# Patient Record
Sex: Female | Born: 1935 | Race: White | Hispanic: No | Smoking: Never smoker
Health system: Southern US, Community
[De-identification: ages and names within clinical notes are randomized; demographics above are authoritative.]

## PROBLEM LIST (undated history)

## (undated) DIAGNOSIS — M1990 Unspecified osteoarthritis, unspecified site: Secondary | ICD-10-CM

## (undated) DIAGNOSIS — F0390 Unspecified dementia without behavioral disturbance: Secondary | ICD-10-CM

## (undated) HISTORY — PX: ABDOMINAL HYSTERECTOMY: SHX81

---

## 1998-06-25 ENCOUNTER — Other Ambulatory Visit: Admission: RE | Admit: 1998-06-25 | Discharge: 1998-06-25 | Payer: Self-pay | Admitting: Obstetrics and Gynecology

## 1999-07-02 ENCOUNTER — Other Ambulatory Visit: Admission: RE | Admit: 1999-07-02 | Discharge: 1999-07-02 | Payer: Self-pay | Admitting: *Deleted

## 1999-11-05 ENCOUNTER — Encounter: Admission: RE | Admit: 1999-11-05 | Discharge: 1999-11-05 | Payer: Self-pay | Admitting: *Deleted

## 1999-11-05 ENCOUNTER — Encounter: Payer: Self-pay | Admitting: *Deleted

## 2000-03-18 ENCOUNTER — Encounter: Admission: RE | Admit: 2000-03-18 | Discharge: 2000-03-18 | Payer: Self-pay | Admitting: *Deleted

## 2000-03-18 ENCOUNTER — Encounter: Payer: Self-pay | Admitting: *Deleted

## 2000-03-31 ENCOUNTER — Encounter: Payer: Self-pay | Admitting: *Deleted

## 2000-03-31 ENCOUNTER — Encounter: Admission: RE | Admit: 2000-03-31 | Discharge: 2000-03-31 | Payer: Self-pay | Admitting: *Deleted

## 2000-06-01 ENCOUNTER — Encounter: Payer: Self-pay | Admitting: Gastroenterology

## 2000-06-01 ENCOUNTER — Encounter: Admission: RE | Admit: 2000-06-01 | Discharge: 2000-06-01 | Payer: Self-pay | Admitting: Gastroenterology

## 2000-11-08 ENCOUNTER — Encounter: Payer: Self-pay | Admitting: *Deleted

## 2000-11-08 ENCOUNTER — Encounter: Admission: RE | Admit: 2000-11-08 | Discharge: 2000-11-08 | Payer: Self-pay | Admitting: *Deleted

## 2001-07-26 ENCOUNTER — Other Ambulatory Visit: Admission: RE | Admit: 2001-07-26 | Discharge: 2001-07-26 | Payer: Self-pay | Admitting: *Deleted

## 2001-10-19 ENCOUNTER — Encounter: Admission: RE | Admit: 2001-10-19 | Discharge: 2001-10-19 | Payer: Self-pay | Admitting: *Deleted

## 2001-10-19 ENCOUNTER — Encounter: Payer: Self-pay | Admitting: *Deleted

## 2002-08-22 ENCOUNTER — Encounter: Admission: RE | Admit: 2002-08-22 | Discharge: 2002-08-22 | Payer: Self-pay

## 2002-10-23 ENCOUNTER — Encounter: Admission: RE | Admit: 2002-10-23 | Discharge: 2002-10-23 | Payer: Self-pay

## 2003-04-15 ENCOUNTER — Emergency Department (HOSPITAL_COMMUNITY): Admission: EM | Admit: 2003-04-15 | Discharge: 2003-04-15 | Payer: Self-pay

## 2003-10-25 ENCOUNTER — Encounter: Admission: RE | Admit: 2003-10-25 | Discharge: 2003-10-25 | Payer: Self-pay | Admitting: Family Medicine

## 2003-11-05 ENCOUNTER — Encounter: Admission: RE | Admit: 2003-11-05 | Discharge: 2004-02-03 | Payer: Self-pay | Admitting: Family Medicine

## 2005-11-06 ENCOUNTER — Encounter: Admission: RE | Admit: 2005-11-06 | Discharge: 2005-11-06 | Payer: Self-pay | Admitting: Family Medicine

## 2007-11-16 ENCOUNTER — Encounter: Admission: RE | Admit: 2007-11-16 | Discharge: 2007-11-16 | Payer: Self-pay | Admitting: Family Medicine

## 2010-10-27 ENCOUNTER — Observation Stay (HOSPITAL_COMMUNITY)
Admission: EM | Admit: 2010-10-27 | Discharge: 2010-10-28 | Disposition: A | Discharge status: Home / Self Care | Payer: Medicare Other | Attending: Infectious Diseases | Admitting: Infectious Diseases

## 2010-10-27 ENCOUNTER — Emergency Department (HOSPITAL_COMMUNITY): Payer: Medicare Other

## 2010-10-27 ENCOUNTER — Encounter: Payer: Self-pay | Admitting: Internal Medicine

## 2010-10-27 DIAGNOSIS — F0280 Dementia in other diseases classified elsewhere without behavioral disturbance: Secondary | ICD-10-CM

## 2010-10-27 DIAGNOSIS — G309 Alzheimer's disease, unspecified: Secondary | ICD-10-CM

## 2010-10-27 DIAGNOSIS — F0390 Unspecified dementia without behavioral disturbance: Secondary | ICD-10-CM | POA: Insufficient documentation

## 2010-10-27 DIAGNOSIS — R32 Unspecified urinary incontinence: Secondary | ICD-10-CM

## 2010-10-27 DIAGNOSIS — R55 Syncope and collapse: Secondary | ICD-10-CM

## 2010-10-27 DIAGNOSIS — R079 Chest pain, unspecified: Secondary | ICD-10-CM | POA: Insufficient documentation

## 2010-10-27 DIAGNOSIS — R61 Generalized hyperhidrosis: Secondary | ICD-10-CM | POA: Insufficient documentation

## 2010-10-27 LAB — BASIC METABOLIC PANEL
Calcium: 9.1 mg/dL (ref 8.4–10.5)
Chloride: 105 mEq/L (ref 96–112)
Creatinine, Ser: 0.79 mg/dL (ref 0.4–1.2)
Potassium: 4.6 mEq/L (ref 3.5–5.1)

## 2010-10-27 LAB — DIFFERENTIAL
Eosinophils Absolute: 0.1 10*3/uL (ref 0.0–0.7)
Eosinophils Relative: 1 % (ref 0–5)
Monocytes Relative: 8 % (ref 3–12)
Neutro Abs: 5.1 10*3/uL (ref 1.7–7.7)

## 2010-10-27 LAB — CBC
HCT: 41.9 % (ref 36.0–46.0)
Hemoglobin: 14.7 g/dL (ref 12.0–15.0)
MCV: 88.6 fL (ref 78.0–100.0)
Platelets: 216 10*3/uL (ref 150–400)
WBC: 8.1 10*3/uL (ref 4.0–10.5)

## 2010-10-27 LAB — TROPONIN I: Troponin I: 0.01 ng/mL (ref 0.00–0.06)

## 2010-10-27 LAB — POCT CARDIAC MARKERS
CKMB, poc: <1 ng/mL — ABNORMAL LOW (ref 1.0–8.0)
Myoglobin, poc: 108 ng/mL (ref 12–200)
Troponin i, poc: <0.05 ng/mL (ref 0.00–0.09)

## 2010-10-27 LAB — CK TOTAL AND CKMB (NOT AT ARMC)
Relative Index: INVALID (ref 0.0–2.5)
Total CK: 38 U/L (ref 7–177)

## 2010-10-27 MED ORDER — IOHEXOL 300 MG/ML  SOLN
100.0000 mL | Freq: Once | INTRAMUSCULAR | Status: AC | PRN
  Administered 2010-10-27: 100 mL via INTRAVENOUS

## 2010-10-27 NOTE — H&P (Signed)
Hospital Admission Note Date: 10/27/2010  Patient name: [name] Medical record number: [id number] Date of birth: 1921/01/09 Age: 77 y.o. Gender: female PCP: [facility]  Medical Service:  Attending physician: Dr. Darlina Sicilian   Pager: Resident (R1): Dr. Tonny Branch    Pager:  Resident (R1): Dr. Scot Dock    Pager: [phone]  Chief Complaint: Passing out and chest pain  History of Present Illness: Ms. Espino is an 77 year old woman with Alzheimer's dementia who presents with an episode of syncope and chest pain. Her son is present and he serves as the historian. He states that he was moving boxes into the house and his mother was helping to hold the door open when she complained of pain around her right axilla. She went to sit down, and when her son came over to check on her she was unresponsive. He states that she was "staring off into space," her eyes were "glazed over," and her palms were sweaty. This went on for about 30 seconds, after which the pt woke up and began complaining of pain in the center of her chest. The pt is unable to comment on the intensity or describe the pain as her dementia has affected her short-term memory. The pt also had some slurred speech and incontinence after the episode. She does have some incontinence at baseline. Prior to this incident, there was no change in her cognition or behavior. She has never had an event like this in the past.  Pt denies fever, SOB or sick contacts.   SH: Pt currently lives with her only son. She has a granddaughter who lives in [place] who also helps with her care. He states that she ambulates well, and stays at home alone during the day. At this time, only her short-term memory is affected by her disease. She is a widow of 13 years, and her husband also had Alzheimer's dementia. Her former PCP moved away (Dr. Larina Bras at [facility]), and since she has decided to go to Dr. Cyndia Bent at [facility], but she has not had an appointment with him yet.     PMH 1. Alzheimer's dementia  Home medications Nemenda 5 mg po qd Donepezil 10 mg po qd  Allergies: NKDA  Family History Social History  Review of Systems: Pertinent items are noted in HPI.  Physical Exam: VS- T- 97.7, P- 61-89, BP- 132-147/55-65, R- 18-20, O2 st- 100% RA. Not orthostatic in the ED  General appearance: alert, cooperative and no distress Head: Normocephalic, without obvious abnormality, atraumatic Neck: no adenopathy, no carotid bruit, no JVD, supple, symmetrical, trachea midline and thyroid not enlarged, symmetric, no tenderness/mass/nodules Lungs: clear to auscultation bilaterally Heart: regular rate and rhythm, S1, S2 normal, no murmur, click, rub or gallop Abdomen: soft, non-tender; bowel sounds normal; no masses,  no organomegaly Extremities: extremities normal, atraumatic, no cyanosis or edema Pulses: 2+ and symmetric Neurologic: Alert and oriented X 3, normal strength and tone. Normal symmetric reflexes. Normal coordination and gait Mental status: Alert, oriented, thought content appropriate Cranial nerves: normal Sensory: normal Motor:grossly normal Reflexes: 2+ and symmetric Coordination: normal Gait: Normal  Lab results:   WBC                                      8.1               4.0-10.5  K/uL  RBC                                      4.73              3.87-5.11        MIL/uL  Hemoglobin (HGB)                         14.7              12.0-15.0        g/dL  Hematocrit (HCT)                         41.9              36.0-46.0        %  MCV                                      88.6              78.0-100.0       fL  MCH -                                    31.1              26.0-34.0        pg  MCHC                                     35.1              30.0-36.0        g/dL  RDW                                      12.7              11.5-15.5        %  Platelet Count (PLT)                      216               150-400          K/uL  Neutrophils, %                           63                43-77            %  Lymphocytes, %                           28                12-46            %  Monocytes, %  8                 3-12             %  Eosinophils, %                           1                 0-5              %  Basophils, %                             0                 0-1              %  Neutrophils, Absolute                    5.1               1.7-7.7          K/uL  Lymphocytes, Absolute                    2.2               0.7-4.0          K/uL  Monocytes, Absolute                      0.7               0.1-1.0          K/uL  Eosinophils, Absolute                    0.1               0.0-0.7          K/uL  Basophils, Absolute                      0.0               0.0-0.1          K/uL   Sodium (NA)                              139               135-145          mEq/L  Potassium (K)                            4.6               3.5-5.1          mEq/L  Chloride                                 105               96-112           mEq/L  CO2  27                19-32            mEq/L  Glucose                                  102        h      70-99            mg/dL  BUN                                      13                6-23             mg/dL  Creatinine                               0.79              0.4-1.2          mg/dL  GFR, Est Non African American            >60               >60              mL/min  GFR, Est African American                >60               >60              mL/min  Calcium                                  9.1               8.4-10.5         mg/dL   CKMB, POC                                <1.0       l      1.0-8.0          ng/mL  Troponin I, POC                          <0.05             0.00-0.09        ng/mL  Myoglobin, POC                           101                12-200           ng/mL  Imaging results:   CTA Chest :  No evidence of pulmonary embolism.                       Small hiatal hernia.  Intrahepatic biliary dilatation, recommend correlation with LFTs and patient history.                       Dependent atelectasis bilateral lower lobes.  Other results: 12 Lead EKG- NSR, Rate- 70/min, Normal axis, normal intervals, no St/T changes.   Assessment & Plan by Problem:  1. Syncope: Neurogenic(stroke) Vs cardiac (arrythmia, ACS, PE)  vs vasovagal vs seizure vs medication (less likely- no recent med changes) Vs metabolic - will admit to telemetry for overnight monitoring - Ce X 3- 8 hrs apart - EKG in AM - neurologic exam normal so acute stroke unlikely but this could have been a TIA. Will not get a CT scan of her head as low yield.  - Will check TSH - If cardiac workup negative, will discharge home tomorrow.   2. Dementia: continue home medications.  3. DVT Px- lovenox 5. Code status: DNR/DNI

## 2010-10-28 DIAGNOSIS — F0280 Dementia in other diseases classified elsewhere without behavioral disturbance: Secondary | ICD-10-CM

## 2010-10-28 DIAGNOSIS — R55 Syncope and collapse: Secondary | ICD-10-CM

## 2010-10-28 DIAGNOSIS — R32 Unspecified urinary incontinence: Secondary | ICD-10-CM

## 2010-10-28 LAB — CBC
HCT: 42.4 % (ref 36.0–46.0)
MCH: 29.9 pg (ref 26.0–34.0)
MCV: 88.1 fL (ref 78.0–100.0)
Platelets: 234 10*3/uL (ref 150–400)
WBC: 7.5 10*3/uL (ref 4.0–10.5)

## 2010-10-28 LAB — COMPREHENSIVE METABOLIC PANEL
ALT: 20 U/L (ref 0–35)
CO2: 25 mEq/L (ref 19–32)
Chloride: 106 mEq/L (ref 96–112)
Creatinine, Ser: 0.88 mg/dL (ref 0.4–1.2)
Glucose, Bld: 99 mg/dL (ref 70–99)
Potassium: 3.5 mEq/L (ref 3.5–5.1)

## 2010-10-28 LAB — LIPID PANEL
Cholesterol: 174 mg/dL (ref 0–200)
Triglycerides: 75 mg/dL (ref <150)
VLDL: 15 mg/dL (ref 0–40)

## 2010-10-28 LAB — TSH: TSH: 1.742 u[IU]/mL (ref 0.350–4.500)

## 2010-10-28 LAB — CARDIAC PANEL(CRET KIN+CKTOT+MB+TROPI): Troponin I: 0.02 ng/mL (ref 0.00–0.06)

## 2010-10-28 LAB — HEMOGLOBIN A1C
Hgb A1c MFr Bld: 5.6 % (ref <5.7)
Mean Plasma Glucose: 114 mg/dL (ref <117)

## 2010-10-28 NOTE — Discharge Summary (Signed)
Please see dictated discharge summary for information please.

## 2010-11-03 NOTE — Discharge Summary (Signed)
[name], [name]           ACCOUNT NO.:  [id number]  MEDICAL RECORD NO.:  [id number]           PATIENT TYPE:  O  LOCATION:  2022                         FACILITY:  [facility]  PHYSICIAN:  Fransisco Hertz, M.D.  DATE OF BIRTH:  June 30, 1921  DATE OF ADMISSION:  10/27/2010 DATE OF DISCHARGE:  10/28/2010                              DISCHARGE SUMMARY   DISCHARGE DIAGNOSES: 1. Syncope. 2. Alzheimer's dementia. 3. Incontinent.  DISCHARGE MEDICATIONS: 1. Donepezil HCl 10 mg. 2. Namenda 5 mg.  DISPOSITION AND FOLLOWUP:  [name] was discharged from [facility] in stable and improved condition.  The patient will follow up with her PCP, Dr. Cyndia Bent on Tuesday, November 04, 2010, at 1 p.m. at [facility].  At this appointment, they will discuss resolution of the patient's symptoms along with establishing primary care, so former physician has moved away and she has not yet establishedcare with the new provider.  CONSULTATIONS:  None.  PROCEDURES PERFORMED: 1. CTA of the chest on October 27, 2010, no evidence of pulmonary     embolism, small hiatal hernia, intrahepatic biliary dilation,     recommend correlation with LFTs and the patient's history,     dependent atelectasis, bilateral lower lobes. 2. A 12-lead EKG on October 27, 2010, normal sinus rhythm at a rate of 70     per minute.  Normal axis and no ST changes.  ADMISSION HISTORY OF PRESENT ILLNESS:  [name] is an 77 year old woman with Alzheimer's dementia who presents with an episode of syncope and chest pain.  Her son is present and he serves as a historian.  He states that he was moving back into the house and his mother was helping to hold the door open when she complained of pain in her right axilla. She went to sit down and when her son came over the check on her, she was unresponsive.  He states that she was staring up into the face, "her eyes were glazed over and her palms were sweaty."   This went on for about 30 seconds after which the patient woke up and began complaining of pain in the center of her chest.  The patient is unable to comment on the intensity or describe the pain as her dementia has affected her short-term memory.  The patient also had some slurred speech and incontinence after the episode.  She does have some incontinence at baseline.  Prior to this incident, there was no change in her cognition or behavior and she has never had an event like this in the past.  The patient denies fevers, shortness of breath, or sick contacts.  ADMISSION PHYSICAL EXAM:  Vital Signs:  Temperature 97.7, pulse 61-89, BP 132-147/55-65, respiratory rate is in the 20, O2 sats 100% on room air, not orthostatic in the ED. GENERAL:  Alert, cooperative and in no acute distress. HEAD:  Normocephalic without obvious abnormality, atraumatic. NECK:  No adenopathy.  No carotid bruits.  No JVD, supple, symmetrical. Trachea midline.  Thyroid not enlarged, symmetric, no tenderness, mass, or nodule. LUNGS:  Clear to auscultation bilaterally. HEART:  Regular rate and rhythm.  S1 and S2 normal.  No murmur, click, rub, or gallop. ABDOMEN:  Soft, nontender.  Bowel sounds normal.  No masses, no organomegaly. EXTREMITIES:  Extremities normal.  Atraumatic.  No cyanosis or edema. Pulses 2+ and symmetric. NEUROLOGIC:  Alert and oriented x3.  Normal strength and tone.  Normal symmetric reflexes.  Normal coordination and gait. MENTAL STATUS:  Alert and oriented.  Thought content appropriate. CRANIAL NERVES:  Normal. SENSORY:  Normal. MOTOR:  Grossly normal. COORDINATION:  Normal. GAIT:  Normal.  ADMISSION LABS:  WBC 8.1, RBC 4.73, hemoglobin 14.7, hematocrit 41.9, MCV 88.6, MCH 31.1, MCHC 35.1, RDW 12.7, platelet count 216, neutrophil percent 63, lymphocyte percent 28, monocyte percent 8, eosinophil percent 1, basophils percent 0.  Sodium 139, potassium 4.6, chloride 105, CO2 of 27, glucose  102, BUN 13, creatinine 0.79, calcium 9.1, CK-MB less than 1, troponin less than 0.05, myoglobin 101.  HOSPITAL COURSE BY PROBLEM LIST: 1. Syncope.  The patient presented with symptoms that suggested     possible syncopal event.  Differential diagnosis include stroke,     cardiac, (arrhythmia, ACS, PE), vasovagal, seizure, medication, or     metabolic cause.  The patient had no neurologic deficits on     physical exam.  No evidence of cardiac abnormalities on 12-lead EKG     or telemetry, no recent medication changes and was not orthostatic.     Cardiac enzymes were cycled overnight with all of the values being     within normal limits.  Lipid panel was also within normal limits.     This event could have been caused by TIA, but no CT scan was ordered     due to low yield at test.  If the patient has another event,     further workup will be warranted as well as an evaluation of her     medication as donepezil has side effects that contribute to this     picture. 2. Dementia.  The patient was first diagnosed with Alzheimer's     dementia approximately 5 years ago and is currently experienced     symptoms of short-term memory loss.  Her son reports she is still     able to ambulate well, perform all activities of daily living.  She     has been treated with both donepezil and Namenda.  The patient's     son who is also her healthcare power of attorney does not believe     that she needs an evaluation for home health nurse at this time.     We discussed in case she may require more monitoring her health at     home. 3. Code status.  The patient's code status was addressed with her son.     He states that she has expressed previously that she wished to be     DNR/DNI.  We have placed orders on file and the orange form was     given to them to keep at home.  DISCHARGE VITALS:  Temp 97.6, pulse 74, BP 115/71, respiratory rate 18, satting 95% on room air.  DISCHARGE LABS:  WBC 7.5, RBC  4.81, hemoglobin 14.4, hematocrit 42.4, MCV 88.1, MCH 29.9, MCHC 34.0, RDW 12.9, platelet count 234.  October 27, 2010, at 23:33, CK total 47, CK-MB 0.9, troponin I 0.02.  October 28, 2010, at 48:45, CK total 49, CK-MB 0.8, troponin I 0.01.  Cholesterol total 174, triglyceride 75, HDL 44, LDL 115, VLDL 15.  Sodium  140, potassium 3.5, chloride 106, CO2 of 25, BUN 18, creatinine 0.88, glucose 99, AST 18, ALT 20, alk phos 84, total bili 0.5, total protein 7.2, albumin 3.5, calcium 9.1.    ______________________________ Leodis Sias, MD   ______________________________ Fransisco Hertz, M.D.    CP/MEDQ  D:  10/28/2010  T:  10/29/2010  Job:  [id number]  cc:   Dr. Tenna Delaine  Electronically Signed by Leodis Sias MD on 10/29/2010 07:10:55 AM Electronically Signed by Lina Sayre M.D. on 11/03/2010 12:39:30 PM

## 2012-01-25 ENCOUNTER — Encounter (HOSPITAL_COMMUNITY): Payer: Self-pay | Admitting: Emergency Medicine

## 2012-01-25 ENCOUNTER — Emergency Department (HOSPITAL_COMMUNITY)
Admission: EM | Admit: 2012-01-25 | Discharge: 2012-01-25 | Disposition: A | Discharge status: Home / Self Care | Payer: Medicare Other | Attending: Emergency Medicine | Admitting: Emergency Medicine

## 2012-01-25 ENCOUNTER — Emergency Department (HOSPITAL_COMMUNITY): Payer: Medicare Other

## 2012-01-25 DIAGNOSIS — S0100XA Unspecified open wound of scalp, initial encounter: Secondary | ICD-10-CM | POA: Insufficient documentation

## 2012-01-25 DIAGNOSIS — M79609 Pain in unspecified limb: Secondary | ICD-10-CM | POA: Insufficient documentation

## 2012-01-25 DIAGNOSIS — Y92009 Unspecified place in unspecified non-institutional (private) residence as the place of occurrence of the external cause: Secondary | ICD-10-CM | POA: Insufficient documentation

## 2012-01-25 DIAGNOSIS — W19XXXA Unspecified fall, initial encounter: Secondary | ICD-10-CM | POA: Insufficient documentation

## 2012-01-25 DIAGNOSIS — S0003XA Contusion of scalp, initial encounter: Secondary | ICD-10-CM | POA: Insufficient documentation

## 2012-01-25 DIAGNOSIS — S0191XA Laceration without foreign body of unspecified part of head, initial encounter: Secondary | ICD-10-CM

## 2012-01-25 DIAGNOSIS — W07XXXA Fall from chair, initial encounter: Secondary | ICD-10-CM | POA: Insufficient documentation

## 2012-01-25 DIAGNOSIS — R51 Headache: Secondary | ICD-10-CM | POA: Insufficient documentation

## 2012-01-25 DIAGNOSIS — F0390 Unspecified dementia without behavioral disturbance: Secondary | ICD-10-CM | POA: Insufficient documentation

## 2012-01-25 HISTORY — DX: Unspecified dementia, unspecified severity, without behavioral disturbance, psychotic disturbance, mood disturbance, and anxiety: F03.90

## 2012-01-25 LAB — CBC
HCT: 44.4 % (ref 36.0–46.0)
Hemoglobin: 15.6 g/dL — ABNORMAL HIGH (ref 12.0–15.0)
MCH: 30.8 pg (ref 26.0–34.0)
MCV: 87.6 fL (ref 78.0–100.0)
Platelets: 248 10*3/uL (ref 150–400)
RDW: 13.1 % (ref 11.5–15.5)
WBC: 10.1 10*3/uL (ref 4.0–10.5)

## 2012-01-25 LAB — POCT I-STAT, CHEM 8
Calcium, Ion: 1.11 mmol/L — ABNORMAL LOW (ref 1.13–1.30)
Chloride: 102 mEq/L (ref 96–112)
Creatinine, Ser: 0.9 mg/dL (ref 0.50–1.10)
Glucose, Bld: 152 mg/dL — ABNORMAL HIGH (ref 70–99)
Glucose, Bld: 122 mg/dL — ABNORMAL HIGH (ref 70–99)
HCT: 47 % — ABNORMAL HIGH (ref 36.0–46.0)
HCT: 45 % (ref 36.0–46.0)
Hemoglobin: 16 g/dL — ABNORMAL HIGH (ref 12.0–15.0)
Hemoglobin: 15.3 g/dL — ABNORMAL HIGH (ref 12.0–15.0)
Potassium: 3.6 mEq/L (ref 3.5–5.1)
Sodium: 144 mEq/L (ref 135–145)
TCO2: 24 mmol/L (ref 0–100)

## 2012-01-25 LAB — COMPREHENSIVE METABOLIC PANEL
AST: 24 U/L (ref 0–37)
Albumin: 4.1 g/dL (ref 3.5–5.2)
Alkaline Phosphatase: 112 U/L (ref 39–117)
Chloride: 100 mEq/L (ref 96–112)
Potassium: 3.6 mEq/L (ref 3.5–5.1)
Sodium: 141 mEq/L (ref 135–145)
Total Bilirubin: 0.7 mg/dL (ref 0.3–1.2)
Total Protein: 8.1 g/dL (ref 6.0–8.3)

## 2012-01-25 LAB — TYPE AND SCREEN
ABO/RH(D): O POS
Antibody Screen: NEGATIVE

## 2012-01-25 LAB — CK: Total CK: 48 U/L (ref 7–177)

## 2012-01-25 LAB — LACTIC ACID, PLASMA: Lactic Acid, Venous: 4.7 mmol/L — ABNORMAL HIGH (ref 0.5–2.2)

## 2012-01-25 LAB — SAMPLE TO BLOOD BANK

## 2012-01-25 LAB — PROTIME-INR: INR: 1.21 (ref 0.00–1.49)

## 2012-01-25 MED ORDER — OXYCODONE-ACETAMINOPHEN 5-325 MG PO TABS: 2.0000 | ORAL_TABLET | ORAL | Status: AC | PRN

## 2012-01-25 MED ORDER — ONDANSETRON HCL 4 MG/2ML IJ SOLN
INTRAMUSCULAR | Status: AC
  Administered 2012-01-25: 17:00:00
  Filled 2012-01-25: qty 2

## 2012-01-25 MED ORDER — SODIUM CHLORIDE 0.9 % IV BOLUS (SEPSIS)
1000.0000 mL | Freq: Once | INTRAVENOUS | Status: AC
  Administered 2012-01-25: 1000 mL via INTRAVENOUS

## 2012-01-25 MED ORDER — TETANUS-DIPHTH-ACELL PERTUSSIS 5-2.5-18.5 LF-MCG/0.5 IM SUSP
0.5000 mL | Freq: Once | INTRAMUSCULAR | Status: AC
  Administered 2012-01-25: 0.5 mL via INTRAMUSCULAR
  Filled 2012-01-25: qty 0.5

## 2012-01-25 MED ORDER — OXYCODONE-ACETAMINOPHEN 5-325 MG PO TABS
2.0000 | ORAL_TABLET | Freq: Once | ORAL | Status: AC
  Administered 2012-01-25: 2 via ORAL
  Filled 2012-01-25: qty 2

## 2012-01-25 NOTE — ED Notes (Signed)
Pt. arrrived by ems. ems reports pt. Was found at home lying in the yard vomiting. Pt. Has lac to the head. Pt was not speaking with ems.  Pt. Now Yelling out "my heels hurt". Pt. Also c/o of her head hurting. ems gave 4mg  of zofran. Pt. Has 20G left forearm. Pt. Has mid abdominal mass. Pt. On lsb, headblocks and neck brace.

## 2012-01-25 NOTE — ED Provider Notes (Signed)
History     CSN: [id number]  Arrival date & time 01/25/12  1637   First MD Initiated Contact with Patient 01/25/12 1656      Chief Complaint  Patient presents with  . Fall    (Consider location/radiation/quality/duration/timing/severity/associated sxs/prior treatment) Patient is a 78 y.o. female presenting with fall. The history is provided by the EMS personnel.  Fall Incident onset: unknown. Incident: unknown but patient found in the yard. Distance fallen: Unknown at this time. Impact surface: Unknown. The volume of blood lost was moderate. Point of impact: Patient complaint heel pain and head pain and so  those may be possible point of impact. The pain is present in the head (feet). The pain is moderate. She was not ambulatory at the scene. There was no entrapment after the fall. There was no alcohol use involved in the accident. Associated symptoms include headaches. Pertinent negatives include no abdominal pain. Treatment on scene includes a c-collar and a backboard. She has tried nothing for the symptoms.    No past medical history on file.  No past surgical history on file.  No family history on file.  History  Substance Use Topics  . Smoking status: Not on file  . Smokeless tobacco: Not on file  . Alcohol Use: Not on file    OB History    No data available      Review of Systems  Unable to perform ROS Eyes: Negative.   Respiratory: Negative for chest tightness and shortness of breath.   Cardiovascular: Negative for chest pain.  Gastrointestinal: Negative for abdominal pain.  Musculoskeletal: Negative for back pain.  Skin: Positive for wound.  Neurological: Positive for headaches.  Psychiatric/Behavioral: Negative.     Allergies  Review of patient's allergies indicates no known allergies.  Home Medications   Current Outpatient Rx  Name Route Sig Dispense Refill  . DONEPEZIL HCL 10 MG PO TABS Oral Take 10 mg by mouth daily.     Marland Kitchen MEMANTINE HCL 5 MG PO  TABS Oral Take 5 mg by mouth daily.        BP 160/83  Pulse 75  SpO2 100%  Physical Exam  Nursing note and vitals reviewed. Constitutional: She appears well-developed and well-nourished. No distress.        Patient covering vomiting feces from incontinence.  HENT:  Head: Head is with contusion and with laceration. Head is without raccoon's eyes, without Battle's sign and without abrasion.    Right Ear: External ear normal.  Left Ear: External ear normal.  Nose: Nose normal.  Mouth/Throat: Oropharynx is clear and moist. No oropharyngeal exudate.       Large laceration in the area dry and is about 6 cm in length.  Eyes: Conjunctivae and EOM are normal. Pupils are equal, round, and reactive to light. Right eye exhibits no discharge. Left eye exhibits no discharge.  Neck: Normal range of motion. Neck supple. No JVD present. No tracheal deviation present. No thyromegaly present.  Cardiovascular: Normal rate, regular rhythm, normal heart sounds and intact distal pulses.  Exam reveals no gallop and no friction rub.   No murmur heard. Pulmonary/Chest: Effort normal and breath sounds normal. No respiratory distress. She has no wheezes. She has no rales. She exhibits no tenderness.  Abdominal: Soft. Bowel sounds are normal. She exhibits no distension. There is no tenderness. There is no rebound and no guarding.  Musculoskeletal: Normal range of motion.  Lymphadenopathy:    She has no cervical adenopathy.  Neurological: She  is alert. No cranial nerve deficit.       Oriented only to person.  Skin: Skin is warm. No rash noted. She is not diaphoretic.    ED Course  LACERATION REPAIR Date/Time: 01/25/2012 5:00 PM Performed by: Sherryl Manges Authorized by: Sherryl Manges Consent: Verbal consent obtained. Risks and benefits: risks, benefits and alternatives were discussed Consent given by: patient Patient identity confirmed: hospital-assigned identification number Time out: Immediately prior  to procedure a "time out" was called to verify the correct patient, procedure, equipment, support staff and site/side marked as required. Body area: head/neck Location details: scalp Laceration length: 6 cm Foreign bodies: no foreign bodies Tendon involvement: none Nerve involvement: none Vascular damage: no Patient sedated: no Irrigation solution: saline Irrigation method: jet lavage Amount of cleaning: extensive Debridement: none Degree of undermining: none Skin closure: staples Number of sutures: 6 Technique: simple Approximation: close Approximation difficulty: simple Dressing: 4x4 sterile gauze Patient tolerance: Patient tolerated the procedure well with no immediate complications.  LACERATION REPAIR Date/Time: 01/25/2012 5:00 PM Performed by: Sherryl Manges Authorized by: Sherryl Manges Consent: Verbal consent obtained. Risks and benefits: risks, benefits and alternatives were discussed Consent given by: patient Required items: required blood products, implants, devices, and special equipment available Patient identity confirmed: arm band Time out: Immediately prior to procedure a "time out" was called to verify the correct patient, procedure, equipment, support staff and site/side marked as required. Body area: head/neck Location details: scalp Laceration length: 6 cm Foreign bodies: no foreign bodies Tendon involvement: none Nerve involvement: none Vascular damage: no Anesthesia: local infiltration Local anesthetic: lidocaine 1% with epinephrine Anesthetic total: 5 ml Patient sedated: no Irrigation solution: saline Irrigation method: jet lavage Amount of cleaning: extensive Debridement: none Degree of undermining: none Skin closure: staples Number of sutures: 8 Approximation: close Approximation difficulty: simple Dressing: 4x4 sterile gauze Patient tolerance: Patient tolerated the procedure well with no immediate complications.  LACERATION  REPAIR Date/Time: 01/25/2012 5:00 PM Performed by: Sherryl Manges Authorized by: Sherryl Manges Consent: Verbal consent obtained. Risks and benefits: risks, benefits and alternatives were discussed Consent given by: patient Required items: required blood products, implants, devices, and special equipment available Patient identity confirmed: arm band Time out: Immediately prior to procedure a "time out" was called to verify the correct patient, procedure, equipment, support staff and site/side marked as required. Body area: head/neck Location details: scalp Laceration length: 3 cm Foreign bodies: no foreign bodies Tendon involvement: none Nerve involvement: none Vascular damage: no Anesthesia: local infiltration Local anesthetic: lidocaine 1% with epinephrine Anesthetic total: 4 ml Patient sedated: no Preparation: Patient was prepped and draped in the usual sterile fashion. Irrigation solution: saline Irrigation method: jet lavage Amount of cleaning: extensive Debridement: none Degree of undermining: none Skin closure: staples Number of sutures: 3 Approximation: close Approximation difficulty: simple Dressing: 4x4 sterile gauze Patient tolerance: Patient tolerated the procedure well with no immediate complications.   (including critical care time)  Labs Reviewed  COMPREHENSIVE METABOLIC PANEL - Abnormal; Notable for the following:    Glucose, Bld 163 (*)     GFR calc non Af Amer 54 (*)     GFR calc Af Amer 63 (*)     All other components within normal limits  CBC - Abnormal; Notable for the following:    Hemoglobin 15.6 (*)     All other components within normal limits  LACTIC ACID, PLASMA - Abnormal; Notable for the following:    Lactic Acid, Venous 4.7 (*)     All  other components within normal limits  PROTIME-INR - Abnormal; Notable for the following:    Prothrombin Time 15.6 (*)     All other components within normal limits  POCT I-STAT, CHEM 8 - Abnormal; Notable  for the following:    Glucose, Bld 152 (*)     Calcium, Ion 1.11 (*)     Hemoglobin 16.0 (*)     HCT 47.0 (*)     All other components within normal limits  SAMPLE TO BLOOD BANK  TYPE AND SCREEN  CK   No results found.   No diagnosis found.    MDM  78 year old female patient presents via EMS after apparent fall. Per EMS family called because patient was found in yard for an unknown time down. Patient apparently had a fall but I'm not able to ascertain the context of the fall she might have hit her home ago she was hit. Patient apparently has a history of dementia and is unable to give me details. Patient does complain of her heels hurting in her head hurting. Patient says that her neck chest abdomen do not hurt but is unable to give any more complex answers. Secondary exam shows no injuries to her bilateral upper extremities or lower extremities. Patient is complaining of some heel pain. Patient was large laceration of head. Patient without any cranial nerve or neuro deficits. Given the patient's age and confused state and laceration to her head was CT head and neck but does not appear to have an injury to chest or abdomen with normal bilateral lung sounds and no peritonitis. Also get trauma labs.  The son is now at bedside. Son says that patient was in the yard working and came back inside and sat down on a chair and lost her balance fell back off a chair and hit her head. Patient's son says patient had no seizure activity. On reevaluation patient patient is now more oriented and awake and alert and is not complaining of feet pain leg pain or arm pain. Her head pain is mild.  Results for orders placed during the hospital encounter of 01/25/12  COMPREHENSIVE METABOLIC PANEL      Component Value Range   Sodium 141  135 - 145 mEq/L   Potassium 3.6  3.5 - 5.1 mEq/L   Chloride 100  96 - 112 mEq/L   CO2 23  19 - 32 mEq/L   Glucose, Bld 163 (*) 70 - 99 mg/dL   BUN 13  6 - 23 mg/dL    Creatinine, Ser 4.09  0.50 - 1.10 mg/dL   Calcium 9.9  8.4 - 81.1 mg/dL   Total Protein 8.1  6.0 - 8.3 g/dL   Albumin 4.1  3.5 - 5.2 g/dL   AST 24  0 - 37 U/L   ALT 21  0 - 35 U/L   Alkaline Phosphatase 112  39 - 117 U/L   Total Bilirubin 0.7  0.3 - 1.2 mg/dL   GFR calc non Af Amer 54 (*) >90 mL/min   GFR calc Af Amer 63 (*) >90 mL/min  CBC      Component Value Range   WBC 10.1  4.0 - 10.5 K/uL   RBC 5.07  3.87 - 5.11 MIL/uL   Hemoglobin 15.6 (*) 12.0 - 15.0 g/dL   HCT 91.4  78.2 - 95.6 %   MCV 87.6  78.0 - 100.0 fL   MCH 30.8  26.0 - 34.0 pg   MCHC 35.1  30.0 - 36.0  g/dL   RDW 47.8  29.5 - 62.1 %   Platelets 248  150 - 400 K/uL  LACTIC ACID, PLASMA      Component Value Range   Lactic Acid, Venous 4.7 (*) 0.5 - 2.2 mmol/L  PROTIME-INR      Component Value Range   Prothrombin Time 15.6 (*) 11.6 - 15.2 seconds   INR 1.21  0.00 - 1.49  SAMPLE TO BLOOD BANK      Component Value Range   Blood Bank Specimen SAMPLE AVAILABLE FOR TESTING     Sample Expiration 01/26/2012    TYPE AND SCREEN      Component Value Range   ABO/RH(D) O POS     Antibody Screen NEG     Sample Expiration 01/28/2012    POCT I-STAT, CHEM 8      Component Value Range   Sodium 143  135 - 145 mEq/L   Potassium 3.6  3.5 - 5.1 mEq/L   Chloride 102  96 - 112 mEq/L   BUN 12  6 - 23 mg/dL   Creatinine, Ser 3.08  0.50 - 1.10 mg/dL   Glucose, Bld 657 (*) 70 - 99 mg/dL   Calcium, Ion 8.46 (*) 1.13 - 1.30 mmol/L   TCO2 22  0 - 100 mmol/L   Hemoglobin 16.0 (*) 12.0 - 15.0 g/dL   HCT 96.2 (*) 95.2 - 84.1 %  CK      Component Value Range   Total CK 48  7 - 177 U/L  POCT I-STAT, CHEM 8      Component Value Range   Sodium 144  135 - 145 mEq/L   Potassium 3.9  3.5 - 5.1 mEq/L   Chloride 103  96 - 112 mEq/L   BUN 12  6 - 23 mg/dL   Creatinine, Ser 3.24  0.50 - 1.10 mg/dL   Glucose, Bld 401 (*) 70 - 99 mg/dL   Calcium, Ion 0.27  2.53 - 1.30 mmol/L   TCO2 24  0 - 100 mmol/L   Hemoglobin 15.3 (*) 12.0 - 15.0 g/dL    HCT 66.4  40.3 - 47.4 %       CT Head Wo Contrast (Final result)   Result time:01/25/12 2595    Final result by Rad Results In Interface (01/25/12 18:08:42)    Narrative:   *RADIOLOGY REPORT*  Clinical Data: Head injury  CT HEAD WITHOUT CONTRAST CT CERVICAL SPINE WITHOUT CONTRAST  Technique: Multidetector CT imaging of the head and cervical spine was performed following the standard protocol without intravenous contrast. Multiplanar CT image reconstructions of the cervical spine were also generated.  Comparison: None  CT HEAD  Findings: Mild global atrophy appropriate for age. Mild chronic ischemic changes in the periventricular white matter. No mass effect, midline shift, or acute intracranial hemorrhage. Soft tissue injury over the high left parietal and frontal region without underlying fracture. Mastoid air cells clear. Minimal mucosal thickening in the ethmoid air cells.  IMPRESSION: No acute intracranial pathology.  CT CERVICAL SPINE  Findings: 5 mm anterolisthesis C3 upon C4 without facet dislocation. Marked arthropathy of the facets at this level. 3 mm retrolisthesis C4 on C5 with severe disc narrowing without facet dislocation. Severe narrowing at C5-6, C6-7, and C7-T1 disc spaces with posterior osteophytic ridging. There is severe arthropathy involving the left C1-C2 articulation moderate facet arthropathy on the left at C2-3 and C3-4. Right sided facet arthropathy occurs at C7-T1. Calcified pleural plaque at the lung apices bilaterally have a chronic  appearance.  IMPRESSION: No acute bony injury in the cervical spine. Chronic changes are noted.  Original Report Authenticated By: Donavan Burnet, M.D.            CT Cervical Spine Wo Contrast (Final result)   Result time:01/25/12 5409    Final result by Rad Results In Interface (01/25/12 18:08:41)    Narrative:   *RADIOLOGY REPORT*  Clinical Data: Head injury  CT HEAD WITHOUT CONTRAST CT  CERVICAL SPINE WITHOUT CONTRAST  Technique: Multidetector CT imaging of the head and cervical spine was performed following the standard protocol without intravenous contrast. Multiplanar CT image reconstructions of the cervical spine were also generated.  Comparison: None  CT HEAD  Findings: Mild global atrophy appropriate for age. Mild chronic ischemic changes in the periventricular white matter. No mass effect, midline shift, or acute intracranial hemorrhage. Soft tissue injury over the high left parietal and frontal region without underlying fracture. Mastoid air cells clear. Minimal mucosal thickening in the ethmoid air cells.  IMPRESSION: No acute intracranial pathology.  CT CERVICAL SPINE  Findings: 5 mm anterolisthesis C3 upon C4 without facet dislocation. Marked arthropathy of the facets at this level. 3 mm retrolisthesis C4 on C5 with severe disc narrowing without facet dislocation. Severe narrowing at C5-6, C6-7, and C7-T1 disc spaces with posterior osteophytic ridging. There is severe arthropathy involving the left C1-C2 articulation moderate facet arthropathy on the left at C2-3 and C3-4. Right sided facet arthropathy occurs at C7-T1. Calcified pleural plaque at the lung apices bilaterally have a chronic appearance.  IMPRESSION: No acute bony injury in the cervical spine. Chronic changes are noted.  Original Report Authenticated By: Donavan Burnet, M.D.            DG Chest 1 View (Final result)   Result time:01/25/12 [id number]    Final result by Rad Results In Interface (01/25/12 17:55:41)    Narrative:   *RADIOLOGY REPORT*  Clinical Data: Larey Seat today hitting head, weakness  CHEST - 1 VIEW  Comparison: None.  Findings: The lungs are clear. Mediastinal contours appear normal. The heart is mildly enlarged. There are degenerative changes throughout the thoracic spine.  IMPRESSION: Mild cardiomegaly. No active lung disease.  Original Report  Authenticated By: Juline Patch, M.D.     Labs consistent with some dehydration, patient given a liter bolus. Imaging is normal. Tetanus will be given. Patient now back in mental baseline per the son. Suspect patient was postconcussive after the fall.  After fluid resuscitation patient continued to appear well. Son continues to confirm that patient is now in her usual state. Given the dizzy spell and mechanical fall out of a chair and hitting of the head I feel the patient may have been dehydrated and lightheaded leading to this fall.patient is now back to baseline. When patient arrived she had elevated lactate but otherwise normal vital signs. Patient was vomiting. Patient was resuscitated with fluid and now appears well is ready for discharge.   Case discussed with Dr. Ermalene Postin, MD 01/26/12 [phone]

## 2012-01-25 NOTE — ED Notes (Signed)
CBG per EMS was 138

## 2012-01-25 NOTE — ED Notes (Addendum)
Pt. Family to bedside. Son witnessed pt. Fall from chair in the kitchen. Pt. Hit head on old milk jug. Son applied compress and cold washcloth to neck because she was diaphoretic. Pt. Lost consciousness for 1-2 mins.after fall pt reportedly "passed out" again. But did not fall again.  Family reports Pt. Is normally conversant with mostly short term memory loss. Pt. Is Alert and oriented to self and can follow commands. Pt cleaned up and sitting up 30 degrees in bed.

## 2012-01-25 NOTE — ED Notes (Signed)
Pt placed in paper scrubs wc to car with family. Orientation at baseline per family.

## 2012-01-26 NOTE — ED Provider Notes (Signed)
I saw and evaluated the patient, reviewed the resident's note and I agree with the findings and plan.  I saw the patient along with Dr. Lew Dawes.  She presents by EMS for evaluation after a fall at home.  She has a pmh significant for dementia.  She was with her son when she fell back in her chair and struck her head on the handle of a large milk jug.  There was no loc, but she did vomit afterward.  She sustained a laceration to her scalp that the son controlled on scene with direct pressure.  She arrived to the ed bleeding actively from the scalp.  This was repaired with staples by Dr. Lew Dawes under my supervision (see his note).  She then underwent ct scans of the head and cervical spine which were unremarkable.  She was hydrated and observed in the ED for several hours during which time her mental status improved and she was at her baseline.  She appeared very well and was alert and ambulatory without difficulty.  I discussed admission with the son, however he was comfortable with taking her home.  She will be discharged to home, to return prn.    Geoffery Lyons, MD 01/26/12 [phone]

## 2013-06-30 ENCOUNTER — Emergency Department (HOSPITAL_COMMUNITY): Payer: Medicare Other

## 2013-06-30 ENCOUNTER — Inpatient Hospital Stay (HOSPITAL_COMMUNITY)
Admission: EM | Admit: 2013-06-30 | Discharge: 2013-07-02 | DRG: 690 | Disposition: A | Discharge status: Home / Self Care | Payer: Medicare Other | Attending: Internal Medicine | Admitting: Internal Medicine

## 2013-06-30 ENCOUNTER — Encounter (HOSPITAL_COMMUNITY): Payer: Self-pay | Admitting: Emergency Medicine

## 2013-06-30 DIAGNOSIS — E876 Hypokalemia: Secondary | ICD-10-CM | POA: Diagnosis present

## 2013-06-30 DIAGNOSIS — R531 Weakness: Secondary | ICD-10-CM | POA: Diagnosis present

## 2013-06-30 DIAGNOSIS — G309 Alzheimer's disease, unspecified: Secondary | ICD-10-CM | POA: Diagnosis present

## 2013-06-30 DIAGNOSIS — W19XXXA Unspecified fall, initial encounter: Secondary | ICD-10-CM | POA: Diagnosis present

## 2013-06-30 DIAGNOSIS — M129 Arthropathy, unspecified: Secondary | ICD-10-CM | POA: Diagnosis present

## 2013-06-30 DIAGNOSIS — A498 Other bacterial infections of unspecified site: Secondary | ICD-10-CM | POA: Diagnosis present

## 2013-06-30 DIAGNOSIS — D72829 Elevated white blood cell count, unspecified: Secondary | ICD-10-CM

## 2013-06-30 DIAGNOSIS — F0280 Dementia in other diseases classified elsewhere without behavioral disturbance: Secondary | ICD-10-CM | POA: Diagnosis present

## 2013-06-30 DIAGNOSIS — N390 Urinary tract infection, site not specified: Principal | ICD-10-CM | POA: Diagnosis present

## 2013-06-30 DIAGNOSIS — Y92009 Unspecified place in unspecified non-institutional (private) residence as the place of occurrence of the external cause: Secondary | ICD-10-CM

## 2013-06-30 HISTORY — DX: Unspecified osteoarthritis, unspecified site: M19.90

## 2013-06-30 LAB — CBC WITH DIFFERENTIAL/PLATELET
HCT: 45.3 % (ref 36.0–46.0)
Hemoglobin: 16.3 g/dL — ABNORMAL HIGH (ref 12.0–15.0)
Lymphocytes Relative: 10 % — ABNORMAL LOW (ref 12–46)
Lymphs Abs: 1.3 10*3/uL (ref 0.7–4.0)
Monocytes Absolute: 1 10*3/uL (ref 0.1–1.0)
Monocytes Relative: 8 % (ref 3–12)
Neutro Abs: 11 10*3/uL — ABNORMAL HIGH (ref 1.7–7.7)
Neutrophils Relative %: 82 % — ABNORMAL HIGH (ref 43–77)
WBC: 13.3 10*3/uL — ABNORMAL HIGH (ref 4.0–10.5)

## 2013-06-30 LAB — URINALYSIS, ROUTINE W REFLEX MICROSCOPIC
Bilirubin Urine: NEGATIVE
Glucose, UA: NEGATIVE mg/dL
Ketones, ur: 15 mg/dL — AB
Urobilinogen, UA: 0.2 mg/dL (ref 0.0–1.0)
pH: 7 (ref 5.0–8.0)

## 2013-06-30 LAB — BASIC METABOLIC PANEL
BUN: 10 mg/dL (ref 6–23)
CO2: 27 mEq/L (ref 19–32)
Chloride: 99 mEq/L (ref 96–112)
Creatinine, Ser: 0.58 mg/dL (ref 0.50–1.10)
Glucose, Bld: 120 mg/dL — ABNORMAL HIGH (ref 70–99)

## 2013-06-30 LAB — URINE MICROSCOPIC-ADD ON

## 2013-06-30 LAB — CK: Total CK: 396 U/L — ABNORMAL HIGH (ref 7–177)

## 2013-06-30 MED ORDER — OXYCODONE HCL 5 MG PO TABS: 5.0000 mg | ORAL_TABLET | ORAL | Status: DC | PRN

## 2013-06-30 MED ORDER — CEPHALEXIN 500 MG PO CAPS
500.0000 mg | ORAL_CAPSULE | Freq: Three times a day (TID) | ORAL | Status: DC
  Administered 2013-07-01 (×2): 500 mg via ORAL
  Filled 2013-06-30 (×7): qty 1

## 2013-06-30 MED ORDER — CEPHALEXIN 250 MG PO CAPS
500.0000 mg | ORAL_CAPSULE | Freq: Once | ORAL | Status: AC
  Administered 2013-06-30: 500 mg via ORAL
  Filled 2013-06-30: qty 2

## 2013-06-30 MED ORDER — ONDANSETRON HCL 4 MG/2ML IJ SOLN: 4.0000 mg | Freq: Four times a day (QID) | INTRAMUSCULAR | Status: DC | PRN

## 2013-06-30 MED ORDER — ALUM & MAG HYDROXIDE-SIMETH 200-200-20 MG/5ML PO SUSP: 30.0000 mL | Freq: Four times a day (QID) | ORAL | Status: DC | PRN

## 2013-06-30 MED ORDER — SODIUM CHLORIDE 0.9 % IV SOLN
INTRAVENOUS | Status: DC
  Administered 2013-06-30: 17:00:00 via INTRAVENOUS

## 2013-06-30 MED ORDER — HYDROMORPHONE HCL PF 1 MG/ML IJ SOLN: 0.5000 mg | INTRAMUSCULAR | Status: DC | PRN

## 2013-06-30 MED ORDER — ONDANSETRON HCL 4 MG PO TABS: 4.0000 mg | ORAL_TABLET | Freq: Four times a day (QID) | ORAL | Status: DC | PRN

## 2013-06-30 MED ORDER — SODIUM CHLORIDE 0.9 % IV SOLN
INTRAVENOUS | Status: DC
  Administered 2013-07-01: 02:00:00 via INTRAVENOUS

## 2013-06-30 MED ORDER — ACETAMINOPHEN 325 MG PO TABS: 650.0000 mg | ORAL_TABLET | Freq: Four times a day (QID) | ORAL | Status: DC | PRN

## 2013-06-30 MED ORDER — ENOXAPARIN SODIUM 40 MG/0.4ML ~~LOC~~ SOLN
40.0000 mg | Freq: Every day | SUBCUTANEOUS | Status: DC
  Administered 2013-07-01 – 2013-07-02 (×2): 40 mg via SUBCUTANEOUS
  Filled 2013-06-30 (×2): qty 0.4

## 2013-06-30 MED ORDER — MEMANTINE HCL 5 MG PO TABS
5.0000 mg | ORAL_TABLET | Freq: Every day | ORAL | Status: DC
  Administered 2013-07-01 – 2013-07-02 (×2): 5 mg via ORAL
  Filled 2013-06-30 (×2): qty 1

## 2013-06-30 MED ORDER — VITAMIN D3 50 MCG (2000 UT) PO TABS
1.0000 | ORAL_TABLET | Freq: Every day | ORAL | Status: DC
  Administered 2013-07-01: 1000 [IU] via ORAL
  Administered 2013-07-02: 1 via ORAL
  Filled 2013-06-30 (×2): qty 1

## 2013-06-30 MED ORDER — DONEPEZIL HCL 10 MG PO TABS
10.0000 mg | ORAL_TABLET | Freq: Every day | ORAL | Status: DC
  Administered 2013-07-01 – 2013-07-02 (×2): 10 mg via ORAL
  Filled 2013-06-30 (×2): qty 1

## 2013-06-30 MED ORDER — ACETAMINOPHEN 650 MG RE SUPP: 650.0000 mg | Freq: Four times a day (QID) | RECTAL | Status: DC | PRN

## 2013-06-30 NOTE — ED Notes (Signed)
Pt awake and more alert, pt denies remember falling and being found on the floor, pt does remember arriving to the ED via [facility] and starting out in the hall, states she remembers my face but not my name

## 2013-06-30 NOTE — ED Notes (Signed)
Ambulated pt a few steps with walker. pt unstable favors left leg.

## 2013-06-30 NOTE — ED Notes (Signed)
[name])  [phone]

## 2013-06-30 NOTE — ED Notes (Signed)
Toniann Fail- Psychiatrist) check on patient while family is away sts saw patient yesterday around 1pm pt was WNL- uses walker to ambulate. and then came back today around 2:30 pm and found pt laying on ground near rug. sts she thinks pt slipped and fell on rug. Sts when she got there today pt was talking normally but c/o leg pain.   Pt denies pain at present time but was groaning when doing bedside cleaning.

## 2013-06-30 NOTE — ED Notes (Signed)
Per EMS pt from home- lives with son who is on vacation and had visitors come check in on her. Last evening visitors checked in pt was fine. This afternoon visitors came by pt. Found on floor for unknown amount of time. Pt unaware of time frame of fall. Pt laying supine on carpeted floor- feces noted in pt adult brief. Pt c/o pain to left upper leg. EMS noted slight deformity on left femur. Tender to palpation. Pt also has redness to posterior head and less than 1 cm laceration to left ear.

## 2013-06-30 NOTE — ED Notes (Signed)
Patient transported to X-ray 

## 2013-06-30 NOTE — ED Provider Notes (Signed)
CSN: [id number]     Arrival date & time 06/30/13  1632 History   First MD Initiated Contact with Patient 06/30/13 1643     Chief Complaint  Patient presents with  . Fall   (Consider location/radiation/quality/duration/timing/severity/associated sxs/prior Treatment) HPI Comments: [name] is a 79 y.o. female who was home alone today, when her sitter found her lying on the floor. It appeared to the sitter that she had tripped on a rug and fallen. It is unclear how long she laid on the floor. According to the sitter, the patient is at her baseline, now. The sitter could not tell if she had slept in her bed last night, or eaten anything today.   Level V caveat- dementia   Patient is a 79 y.o. female presenting with fall. The history is provided by the patient.  Fall    Past Medical History  Diagnosis Date  . Dementia    History reviewed. No pertinent past surgical history. No family history on file. History  Substance Use Topics  . Smoking status: Never Smoker   . Smokeless tobacco: Not on file  . Alcohol Use: Not on file   OB History   Grav Para Term Preterm Abortions TAB SAB Ect Mult Living                 Review of Systems  Unable to perform ROS   Allergies  Review of patient's allergies indicates no known allergies.  Home Medications   Current Outpatient Rx  Name  Route  Sig  Dispense  Refill  . Cholecalciferol (VITAMIN D3) 2000 UNITS TABS   Oral   Take 1 tablet by mouth daily.         Marland Kitchen donepezil (ARICEPT) 10 MG tablet   Oral   Take 10 mg by mouth daily.          . memantine (NAMENDA) 5 MG tablet   Oral   Take 5 mg by mouth daily.            BP 136/64  Pulse 116  Temp(Src) 98.5 F (36.9 C) (Oral)  Resp 23  SpO2 96% Physical Exam  Nursing note and vitals reviewed. Constitutional: She appears well-developed.  Frail, elderly  HENT:  Head: Normocephalic and atraumatic.  Eyes: Conjunctivae and EOM are normal. Pupils are equal,  round, and reactive to light.  Neck: Normal range of motion and phonation normal. Neck supple.  Cardiovascular: Normal rate, regular rhythm and intact distal pulses.   Pulmonary/Chest: Effort normal and breath sounds normal. She exhibits no tenderness.  Abdominal: Soft. She exhibits no distension. There is no tenderness. There is no guarding.  Musculoskeletal: Normal range of motion.  No deformities of large joints, arms, and leg  Neurological: She is alert. No cranial nerve deficit. She exhibits normal muscle tone.  Skin: Skin is warm and dry.  Psychiatric: She has a normal mood and affect. Her behavior is normal.    ED Course  Procedures (including critical care time) Medications  0.9 %  sodium chloride infusion ( Intravenous New Bag/Given 06/30/13 1728)  cephALEXin (KEFLEX) capsule 500 mg (500 mg Oral Given 06/30/13 1901)    Patient Vitals for the past 24 hrs:  BP Temp Temp src Pulse Resp SpO2  06/30/13 2208 136/64 mmHg - - 116 23 96 %  06/30/13 2130 171/84 mmHg - - 53 21 74 %  06/30/13 2129 159/80 mmHg - - - 22 95 %  06/30/13 1857 188/90 mmHg - - 103  23 96 %  06/30/13 1800 190/84 mmHg - - 97 20 97 %  06/30/13 1715 170/87 mmHg - - 97 16 98 %  06/30/13 1700 187/106 mmHg - - 97 19 97 %  06/30/13 1649 203/85 mmHg 98.5 F (36.9 C) Oral 97 16 99 %  06/30/13 1646 - - - - - 97 %   10:00 PM Reevaluation with update and discussion. After initial assessment and treatment, an updated evaluation reveals No further c/o. Her sitter is still here, son has not arrived yet. Courtney Bellizzi L   22:08- ambulation trial using walker- she was unable to safely walk independently, and almost fell when she had an episode of retropulsion. Discussed with sitter. We will arrange admission for treatment and stabilization   10:15 PM-Consult complete with Hospitalist. Patient case explained and discussed. He agrees to admit patient for further evaluation and treatment. Call ended at 2230  Labs Review Labs  Reviewed  CBC WITH DIFFERENTIAL - Abnormal; Notable for the following:    WBC 13.3 (*)    RBC 5.16 (*)    Hemoglobin 16.3 (*)    Neutrophils Relative % 82 (*)    Neutro Abs 11.0 (*)    Lymphocytes Relative 10 (*)    All other components within normal limits  BASIC METABOLIC PANEL - Abnormal; Notable for the following:    Potassium 3.4 (*)    Glucose, Bld 120 (*)    GFR calc non Af Amer 79 (*)    All other components within normal limits  URINALYSIS, ROUTINE W REFLEX MICROSCOPIC - Abnormal; Notable for the following:    APPearance CLOUDY (*)    Hgb urine dipstick MODERATE (*)    Ketones, ur 15 (*)    Protein, ur 30 (*)    Nitrite POSITIVE (*)    Leukocytes, UA SMALL (*)    All other components within normal limits  CK - Abnormal; Notable for the following:    Total CK 396 (*)    All other components within normal limits  URINE MICROSCOPIC-ADD ON - Abnormal; Notable for the following:    Bacteria, UA MANY (*)    All other components within normal limits  URINE CULTURE   Imaging Review Dg Chest 2 View  06/30/2013   CLINICAL DATA:  Recent traumatic injury with pain  EXAM: CHEST  2 VIEW  COMPARISON:  01/25/2012  FINDINGS: Cardiac shadow is within normal limits. Aortic calcifications are again noted. The lungs are clear. No acute bony abnormality is noted.  IMPRESSION: No active cardiopulmonary disease.   Electronically Signed   By: Alcide Clever M.D.   On: 06/30/2013 18:45   Dg Pelvis 1-2 Views  06/30/2013   CLINICAL DATA:  Left hip pain, status post fall.  EXAM: PELVIS - 1-2 VIEW  COMPARISON:  None.  FINDINGS: There is no evidence of fracture or dislocation. Both femoral heads are seated normally within their respective acetabula. Bilateral axial joint space narrowing is noted, with scattered associated osteophyte formation. The sacroiliac joints are unremarkable in appearance. Mild left convex lumbar scoliosis is noted.  The visualized bowel gas pattern is grossly unremarkable in  appearance.  IMPRESSION: 1. No evidence of fracture or dislocation. 2. Mild degenerative change at both hips. 3. Mild left convex lumbar scoliosis noted.   Electronically Signed   By: Roanna Raider M.D.   On: 06/30/2013 21:33   Ct Head Wo Contrast  06/30/2013   CLINICAL DATA:  Possible slip and fall.  Dementia.  EXAM: CT HEAD  WITHOUT CONTRAST  TECHNIQUE: Contiguous axial images were obtained from the base of the skull through the vertex without contrast.  COMPARISON:  01/25/2012.  FINDINGS: No evidence for acute infarction, hemorrhage, mass lesion, hydrocephalus, or extra-axial fluid. Moderately advanced cerebral and cerebellar atrophy. Extensive hypodense the white matter, likely chronic microvascular ischemic change. Vascular calcification. Calvarium intact. No scalp foreign body or visible laceration.  IMPRESSION: No acute intracranial abnormality. Advanced atrophy and small vessel disease. Thank epicenter   Electronically Signed   By: Davonna Belling M.D.   On: 06/30/2013 18:40    EKG Interpretation   None       Date: 06/30/13  Rate: 63  Rhythm: indeterminate  QRS Axis: normal  PR and QT Intervals: normal  ST/T Wave abnormalities: nonspecific ST/T changes  PR and QRS Conduction Disutrbances:normal QT  Narrative Interpretation:   Old EKG Reviewed: unchanged    MDM   1. Fall at home, initial encounter   2. UTI (lower urinary tract infection)   3. Weakness      Fall secondary to weakness and urinary tract infection. No significant injuries were encountered. Patient is too weak to send home and is at high risk for falling.   Nursing Notes Reviewed/ Care Coordinated, and agree without changes. Applicable Imaging Reviewed.  Interpretation of Laboratory Data incorporated into ED treatment  Plan: Admit  Flint Melter, MD 07/01/13 [phone]

## 2013-06-30 NOTE — ED Notes (Signed)
Pt returned from radiology.

## 2013-06-30 NOTE — ED Notes (Addendum)
Pt. Attempted to ambulate by this RN and [facility] EMT using walker. Pt unsteady getting up and took a few steps forward with unsteady gait pt appears to be putting most weight on right leg. Dr. Effie Shy informed. Pt given [place] sandwich meal and water per MD.

## 2013-06-30 NOTE — ED Notes (Signed)
Patient transported to CT 

## 2013-06-30 NOTE — ED Notes (Signed)
Pt undressed, in gown, on monitor, continuous pulse oximetry and blood pressure cuff 

## 2013-07-01 DIAGNOSIS — R5381 Other malaise: Secondary | ICD-10-CM

## 2013-07-01 DIAGNOSIS — F0280 Dementia in other diseases classified elsewhere without behavioral disturbance: Secondary | ICD-10-CM

## 2013-07-01 DIAGNOSIS — D72829 Elevated white blood cell count, unspecified: Secondary | ICD-10-CM

## 2013-07-01 DIAGNOSIS — Y92009 Unspecified place in unspecified non-institutional (private) residence as the place of occurrence of the external cause: Secondary | ICD-10-CM

## 2013-07-01 DIAGNOSIS — N390 Urinary tract infection, site not specified: Principal | ICD-10-CM

## 2013-07-01 DIAGNOSIS — W19XXXA Unspecified fall, initial encounter: Secondary | ICD-10-CM

## 2013-07-01 LAB — CBC
Hemoglobin: 14.1 g/dL (ref 12.0–15.0)
MCH: 30.7 pg (ref 26.0–34.0)
MCHC: 34.6 g/dL (ref 30.0–36.0)
Platelets: 283 10*3/uL (ref 150–400)
RBC: 4.6 MIL/uL (ref 3.87–5.11)
WBC: 10.8 10*3/uL — ABNORMAL HIGH (ref 4.0–10.5)

## 2013-07-01 LAB — MAGNESIUM: Magnesium: 2.1 mg/dL (ref 1.5–2.5)

## 2013-07-01 LAB — BASIC METABOLIC PANEL
BUN: 15 mg/dL (ref 6–23)
Calcium: 8.9 mg/dL (ref 8.4–10.5)
Chloride: 103 mEq/L (ref 96–112)
GFR calc Af Amer: 70 mL/min — ABNORMAL LOW (ref >90)
GFR calc non Af Amer: 60 mL/min — ABNORMAL LOW (ref >90)
Glucose, Bld: 97 mg/dL (ref 70–99)
Sodium: 141 mEq/L (ref 135–145)

## 2013-07-01 MED ORDER — POTASSIUM CHLORIDE CRYS ER 20 MEQ PO TBCR
40.0000 meq | EXTENDED_RELEASE_TABLET | Freq: Three times a day (TID) | ORAL | Status: DC
  Administered 2013-07-01 (×3): 40 meq via ORAL
  Filled 2013-07-01 (×4): qty 2

## 2013-07-01 MED ORDER — DEXTROSE 5 % IV SOLN
1.0000 g | INTRAVENOUS | Status: DC
  Administered 2013-07-02: 1 g via INTRAVENOUS
  Filled 2013-07-01: qty 10

## 2013-07-01 MED ORDER — DEXTROSE 5 % IV SOLN
1.0000 g | INTRAVENOUS | Status: DC
  Filled 2013-07-01: qty 10

## 2013-07-01 NOTE — Progress Notes (Signed)
Utilization Review completed.  

## 2013-07-01 NOTE — Progress Notes (Addendum)
[facility] PROGRESS NOTE  ACHAIA GARLOCK ZOX:[id number] DOB: 12/13/1920 DOA: 06/30/2013 PCP: No primary provider on file.  Assessment/Plan: Generalized Weakness- Due to UTI, on rocpehin.  2. UTI- Urine C+S sent, on rocpehin.  3. Fall- Physical Therapy consult for Gait evaluation.  4. Leukocytosis- Due to #2. Monitor Trned.  5. Alzheimers Dementia- Chronic on Aricept and Namenda.  6. DVT Prophylaxis with Lovenox.  7. Hypokalemia: replete as needed.     Code Status: full code Family Communication: discussed with the son at bedside Disposition Plan: possible tomorrow.    Consultants:  none  Procedures:  none  Antibiotics:  rocephin  HPI/Subjective: No new complaints.   Objective: Filed Vitals:   07/01/13 1209  BP: 102/49  Pulse: 86  Temp: 97.9 F (36.6 C)  Resp: 16    Intake/Output Summary (Last 24 hours) at 07/01/13 1756 Last data filed at 07/01/13 1300  Gross per 24 hour  Intake   1400 ml  Output      0 ml  Net   1400 ml   Filed Weights   06/30/13 2335  Weight: 63.504 kg (140 lb)    Exam:   General:  Alert afebrile comfortable  Cardiovascular: s1s2  Respiratory: ctab  Abdomen: soft NT ND BS+  Musculoskeletal: no pedal edema.   Data Reviewed: Basic Metabolic Panel:  Recent Labs Lab 06/30/13 1745 07/01/13 0555  NA 139 141  K 3.4* 3.0*  CL 99 103  CO2 27 25  GLUCOSE 120* 97  BUN 10 15  CREATININE 0.58 0.83  CALCIUM 9.2 8.9  MG  --  2.1   Liver Function Tests: No results found for this basename: AST, ALT, ALKPHOS, BILITOT, PROT, ALBUMIN,  in the last 168 hours No results found for this basename: LIPASE, AMYLASE,  in the last 168 hours No results found for this basename: AMMONIA,  in the last 168 hours CBC:  Recent Labs Lab 06/30/13 1745 07/01/13 0555  WBC 13.3* 10.8*  NEUTROABS 11.0*  --   HGB 16.3* 14.1  HCT 45.3 40.8  MCV 87.8 88.7  PLT 282 283   Cardiac Enzymes:  Recent Labs Lab 06/30/13 1745   CKTOTAL 396*   BNP (last 3 results) No results found for this basename: PROBNP,  in the last 8760 hours CBG: No results found for this basename: GLUCAP,  in the last 168 hours  Recent Results (from the past 240 hour(s))  URINE CULTURE     Status: None   Collection Time    06/30/13  5:40 PM      Result Value Range Status   Specimen Description URINE, CATHETERIZED   Final   Special Requests NONE   Final   Culture  Setup Time     Final   Value: 06/30/2013 18:49     Performed at [facility] Count     Final   Value: >=100,000 COLONIES/ML     Performed at [facility]   Culture     Final   Value: ESCHERICHIA COLI     Performed at [facility]   Report Status PENDING   Incomplete     Studies: Dg Chest 2 View  06/30/2013   CLINICAL DATA:  Recent traumatic injury with pain  EXAM: CHEST  2 VIEW  COMPARISON:  01/25/2012  FINDINGS: Cardiac shadow is within normal limits. Aortic calcifications are again noted. The lungs are clear. No acute bony abnormality is noted.  IMPRESSION: No active cardiopulmonary disease.  Electronically Signed   By: Alcide Clever M.D.   On: 06/30/2013 18:45   Dg Pelvis 1-2 Views  06/30/2013   CLINICAL DATA:  Left hip pain, status post fall.  EXAM: PELVIS - 1-2 VIEW  COMPARISON:  None.  FINDINGS: There is no evidence of fracture or dislocation. Both femoral heads are seated normally within their respective acetabula. Bilateral axial joint space narrowing is noted, with scattered associated osteophyte formation. The sacroiliac joints are unremarkable in appearance. Mild left convex lumbar scoliosis is noted.  The visualized bowel gas pattern is grossly unremarkable in appearance.  IMPRESSION: 1. No evidence of fracture or dislocation. 2. Mild degenerative change at both hips. 3. Mild left convex lumbar scoliosis noted.   Electronically Signed   By: Roanna Raider M.D.   On: 06/30/2013 21:33   Ct Head Wo Contrast  06/30/2013    CLINICAL DATA:  Possible slip and fall.  Dementia.  EXAM: CT HEAD WITHOUT CONTRAST  TECHNIQUE: Contiguous axial images were obtained from the base of the skull through the vertex without contrast.  COMPARISON:  01/25/2012.  FINDINGS: No evidence for acute infarction, hemorrhage, mass lesion, hydrocephalus, or extra-axial fluid. Moderately advanced cerebral and cerebellar atrophy. Extensive hypodense the white matter, likely chronic microvascular ischemic change. Vascular calcification. Calvarium intact. No scalp foreign body or visible laceration.  IMPRESSION: No acute intracranial abnormality. Advanced atrophy and small vessel disease. Thank epicenter   Electronically Signed   By: Davonna Belling M.D.   On: 06/30/2013 18:40    Scheduled Meds: . [START ON 07/02/2013] cefTRIAXone (ROCEPHIN)  IV  1 g Intravenous Q24H  . donepezil  10 mg Oral Daily  . enoxaparin (LOVENOX) injection  40 mg Subcutaneous Daily  . memantine  5 mg Oral Daily  . potassium chloride  40 mEq Oral TID  . Vitamin D3  1 tablet Oral Daily   Continuous Infusions: . sodium chloride 75 mL/hr at 07/01/13 0226    Principal Problem:   Weakness generalized Active Problems:   Alzheimer's dementia   UTI (lower urinary tract infection)   Fall at home   Leukocytosis, unspecified    Time spent: 25 minutes.    [facility]  [facility] Pager [phone] If 7PM-7AM, please contact night-coverage at [email or web address], password [email or web address] 07/01/2013, 5:56 PM  LOS: 1 day

## 2013-07-01 NOTE — H&P (Signed)
[facility] History and Physical  [name] WUJ:[id number] DOB: 02/27/1921 DOA: 06/30/2013  Referring physician:  PCP: No primary provider on file.  Specialists:   Chief Complaint: Found on Floor  HPI: [name] is a 79 y.o. female who was sent to the ED after being found on the Floor by her home sitter at about 2:30 pm.  The sitter arrived and did not know how long she had been on the floor, but reported that the patient complained of having pain in her left thigh and could not stand up.   She called EMS and she was brought to the ED.  She was evaluated and had a negative CT scan of the Brain for acute findings , and Negative Xrays of the left Hip and pelvis for fractures but her labs revealed a UTI. She was placed on PO Keflex and given IVFs and referred for medical admission.      Review of Systems: Unable to Obtain from the Patient  Past Medical History  Diagnosis Date  . Dementia   . Arthritis     History reviewed. No pertinent past surgical history.  Prior to Admission medications   Medication Sig Start Date End Date Taking? Authorizing Provider  Cholecalciferol (VITAMIN D3) 2000 UNITS TABS Take 1 tablet by mouth daily.   Yes Historical Provider, MD  donepezil (ARICEPT) 10 MG tablet Take 10 mg by mouth daily.    Yes Historical Provider, MD  memantine (NAMENDA) 5 MG tablet Take 5 mg by mouth daily.     Yes Historical Provider, MD    No Known Allergies  Social History:  reports that she has never smoked. She does not have any smokeless tobacco history on file. Her alcohol and drug histories are not on file.     No family history on file.     Physical Exam:  GEN:  Pleasant Obese Elderly 79 y.o. Caucasian female  examined  and in no acute distress; cooperative with exam Filed Vitals:   06/30/13 2130 06/30/13 2208 06/30/13 2255 06/30/13 2335  BP: 171/84 136/64 138/64 160/89  Pulse: 53 116  105  Temp:   98.3 F (36.8 C) 98.8 F (37.1 C)   TempSrc:   Oral Oral  Resp: 21 23 26 22   Height:    5\' 7"  (1.702 m)  Weight:    63.504 kg (140 lb)  SpO2: 74% 96% 97% 96%   Blood pressure 160/89, pulse 105, temperature 98.8 F (37.1 C), temperature source Oral, resp. rate 22, height 5\' 7"  (1.702 m), weight 63.504 kg (140 lb), SpO2 96.00%. PSYCH: SHe is alert and oriented x4; does not appear anxious does not appear depressed; affect is normal HEENT: Normocephalic and Atraumatic, Mucous membranes pink; PERRLA; EOM intact; Fundi:  Benign;  No scleral icterus, Nares: Patent, Oropharynx: Clear, Edentulous with Dentures on top and bottom, Neck:  FROM, no cervical lymphadenopathy nor thyromegaly or carotid bruit; no JVD; Breasts:: Not examined CHEST WALL: No tenderness CHEST: Normal respiration, clear to auscultation bilaterally HEART: Regular rate and rhythm; no murmurs rubs or gallops BACK: No kyphosis or scoliosis; no CVA tenderness ABDOMEN: Positive Bowel Sounds, Obese, soft non-tender; no masses, no organomegaly, no pannus; no intertriginous candida. Rectal Exam: Not done EXTREMITIES: No cyanosis, clubbing or edema; no ulcerations. Genitalia: not examined PULSES: 2+ and symmetric SKIN: Normal hydration no rash or ulceration CNS: Cranial nerves 2-12 grossly intact no focal neurologic deficit    Labs on Admission:  Basic Metabolic Panel:  Recent Labs  Lab 06/30/13 1745  NA 139  K 3.4*  CL 99  CO2 27  GLUCOSE 120*  BUN 10  CREATININE 0.58  CALCIUM 9.2   Liver Function Tests: No results found for this basename: AST, ALT, ALKPHOS, BILITOT, PROT, ALBUMIN,  in the last 168 hours No results found for this basename: LIPASE, AMYLASE,  in the last 168 hours No results found for this basename: AMMONIA,  in the last 168 hours CBC:  Recent Labs Lab 06/30/13 1745  WBC 13.3*  NEUTROABS 11.0*  HGB 16.3*  HCT 45.3  MCV 87.8  PLT 282   Cardiac Enzymes:  Recent Labs Lab 06/30/13 1745  CKTOTAL 396*    BNP (last 3  results) No results found for this basename: PROBNP,  in the last 8760 hours CBG: No results found for this basename: GLUCAP,  in the last 168 hours  Radiological Exams on Admission: Dg Chest 2 View  06/30/2013   CLINICAL DATA:  Recent traumatic injury with pain  EXAM: CHEST  2 VIEW  COMPARISON:  01/25/2012  FINDINGS: Cardiac shadow is within normal limits. Aortic calcifications are again noted. The lungs are clear. No acute bony abnormality is noted.  IMPRESSION: No active cardiopulmonary disease.   Electronically Signed   By: Alcide Clever M.D.   On: 06/30/2013 18:45   Dg Pelvis 1-2 Views  06/30/2013   CLINICAL DATA:  Left hip pain, status post fall.  EXAM: PELVIS - 1-2 VIEW  COMPARISON:  None.  FINDINGS: There is no evidence of fracture or dislocation. Both femoral heads are seated normally within their respective acetabula. Bilateral axial joint space narrowing is noted, with scattered associated osteophyte formation. The sacroiliac joints are unremarkable in appearance. Mild left convex lumbar scoliosis is noted.  The visualized bowel gas pattern is grossly unremarkable in appearance.  IMPRESSION: 1. No evidence of fracture or dislocation. 2. Mild degenerative change at both hips. 3. Mild left convex lumbar scoliosis noted.   Electronically Signed   By: Roanna Raider M.D.   On: 06/30/2013 21:33   Ct Head Wo Contrast  06/30/2013   CLINICAL DATA:  Possible slip and fall.  Dementia.  EXAM: CT HEAD WITHOUT CONTRAST  TECHNIQUE: Contiguous axial images were obtained from the base of the skull through the vertex without contrast.  COMPARISON:  01/25/2012.  FINDINGS: No evidence for acute infarction, hemorrhage, mass lesion, hydrocephalus, or extra-axial fluid. Moderately advanced cerebral and cerebellar atrophy. Extensive hypodense the white matter, likely chronic microvascular ischemic change. Vascular calcification. Calvarium intact. No scalp foreign body or visible laceration.  IMPRESSION: No acute  intracranial abnormality. Advanced atrophy and small vessel disease. Thank epicenter   Electronically Signed   By: Davonna Belling M.D.   On: 06/30/2013 18:40       Assessment/Plan Principal Problem:   Weakness generalized Active Problems:   UTI (lower urinary tract infection)   Alzheimer's dementia   Fall at home   Leukocytosis, unspecified    1.  Generalized Weakness-   Due to UTI,  Placed on PO Keflex by EDP.    2. UTI- Urine C+S sent, PO Keflex started in ED.    3.  Fall-  Physical Therapy consult for Gait evaluation.    4.  Leukocytosis-  Due to #2.  Monitor Trned.   5.  Alzheimers Dementia-   Chronic on Aricept and Namenda.    6.  DVT Prophylaxis with Lovenox.       Code Status:     FULL CODE Family  Communication:   Family paid Sitter at Bedside   Disposition Plan:     Inpatient  Time spent:  17 Minutes  Ron Parker [facility] Pager [phone]  If 7PM-7AM, please contact night-coverage [email or web address] Password [email or web address] 07/01/2013, 1:29 AM

## 2013-07-01 NOTE — Progress Notes (Signed)
OT Cancellation Note  Patient Details Name: [name] MRN: [id number] DOB: 05/28/1921   Cancelled Treatment:    Reason Eval/Treat Not Completed: OT screened, no needs identified, will sign off. Pt's son who was in room reports that he baths and dresses the patient at home, there is always someone with her, and she has all needed DME. He does not foresee any BADL issues after seeing pt up with PT earlier today.   Evette Georges [phone] 07/01/2013, 3:44 PM

## 2013-07-01 NOTE — Evaluation (Signed)
Physical Therapy Evaluation Patient Details Name: TENNYSON KALLEN MRN: [id number] DOB: 09-03-1920 Today's Date: 07/01/2013 Time: 4098-1191 PT Time Calculation (min): 24 min  PT Assessment / Plan / Recommendation History of Present Illness  patient was sent to the ED after being found on the Floor by her home sitter at about 2:30 pm. The sitter reports that the patient complained of having pain in her left thigh and could not stand up. She was evaluated and had a negative CT scan of the Brain for acute findings , and Negative Xrays of the left Hip and pelvis for fractures but her labs revealed a UTI. She was placed on PO Keflex and given IVFs and referred for medical admissio  Clinical Impression  Patient did well with mobility today.  Patient did require slightly more assistance today than her baseline.  This might be due to decreased activity since admission.  Patient will benefit from PT to increase mobility and independence back to baseline.  Do feel patient is safe to go home to her current setting, as she has 24 hour caregivers.  Will follow during hospitalization.    PT Assessment  Patient needs continued PT services while in hospital   Follow Up Recommendations  No PT follow up at discharge    Does the patient have the potential to tolerate intense rehabilitation      Barriers to Discharge        Equipment Recommendations  None recommended by PT    Recommendations for Other Services     Frequency Min 3X/week    Precautions / Restrictions Precautions Precautions: Fall   Pertinent Vitals/Pain Patient denies any pain      Mobility  Bed Mobility Bed Mobility: Supine to Sit Supine to Sit: 3: Mod assist Details for Bed Mobility Assistance: required direction for sequencing; required assistance to raise shoulders off bed and scoot to EOB Transfers Transfers: Sit to Stand;Stand to Sit Sit to Stand: 4: Min assist Stand to Sit: 4: Min assist Details for Transfer  Assistance: required cueing for sequencing; assistance to control descent and raise hips off bed Ambulation/Gait Ambulation/Gait Assistance: 4: Min guard Ambulation Distance (Feet): 75 Feet Assistive device: Rolling walker Gait Pattern: Step-through pattern;Trunk flexed    Exercises     PT Diagnosis: Generalized weakness  PT Problem List: Decreased activity tolerance;Decreased mobility PT Treatment Interventions: Gait training;Functional mobility training;Therapeutic activities     PT Goals(Current goals can be found in the care plan section) Acute Rehab PT Goals Patient Stated Goal: son:  bring her back home PT Goal Formulation: With patient/family Time For Goal Achievement: 07/08/13 Potential to Achieve Goals: Good  Visit Information  Last PT Received On: 07/01/13 Assistance Needed: +1 History of Present Illness: patient was sent to the ED after being found on the Floor by her home sitter at about 2:30 pm. The sitter reports that the patient complained of having pain in her left thigh and could not stand up. She was evaluated and had a negative CT scan of the Brain for acute findings , and Negative Xrays of the left Hip and pelvis for fractures but her labs revealed a UTI. She was placed on PO Keflex and given IVFs and referred for medical admissio       Prior Functioning  Home Living Family/patient expects to be discharged to:: Private residence Living Arrangements: Children Available Help at Discharge: Family;Personal care attendant Type of Home: House Home Access: Stairs to enter [employer] of Steps: 2 Entrance Stairs-Rails: Right Home  Layout: One level Home Equipment: Walker - standard Prior Function Level of Independence: Independent with assistive device(s) Communication Communication: No difficulties    Cognition  Cognition Arousal/Alertness: Awake/alert Behavior During Therapy: WFL for tasks assessed/performed Overall Cognitive Status: History of  cognitive impairments - at baseline Memory: Decreased short-term memory    Extremity/Trunk Assessment Upper Extremity Assessment Upper Extremity Assessment: Defer to OT evaluation Lower Extremity Assessment Lower Extremity Assessment: Generalized weakness   Balance Balance Balance Assessed: No  End of Session PT - End of Session Equipment Utilized During Treatment: Gait belt Activity Tolerance: Patient tolerated treatment well Patient left: in chair;with family/visitor present Nurse Communication: Mobility status  GP Functional Assessment Tool Used: clinical judgement Functional Limitation: Mobility: Walking and moving around Mobility: Walking and Moving Around Current Status [phone]): At least 1 percent but less than 20 percent impaired, limited or restricted Mobility: Walking and Moving Around Goal Status [phone]): At least 1 percent but less than 20 percent impaired, limited or restricted   Olivia Canter, [place] [phone] 07/01/2013, 11:56 AM

## 2013-07-02 LAB — URINE CULTURE

## 2013-07-02 LAB — BASIC METABOLIC PANEL
Chloride: 108 mEq/L (ref 96–112)
GFR calc Af Amer: 64 mL/min — ABNORMAL LOW (ref >90)
GFR calc non Af Amer: 55 mL/min — ABNORMAL LOW (ref >90)
Potassium: 5.1 mEq/L (ref 3.5–5.1)
Sodium: 139 mEq/L (ref 135–145)

## 2013-07-02 LAB — CBC
HCT: 38.6 % (ref 36.0–46.0)
MCHC: 33.7 g/dL (ref 30.0–36.0)
Platelets: 245 10*3/uL (ref 150–400)
RBC: 4.23 MIL/uL (ref 3.87–5.11)
RDW: 14 % (ref 11.5–15.5)
WBC: 8.3 10*3/uL (ref 4.0–10.5)

## 2013-07-02 MED ORDER — LEVOFLOXACIN 500 MG PO TABS: 500.0000 mg | ORAL_TABLET | Freq: Every day | ORAL | Status: DC

## 2013-07-02 NOTE — Discharge Summary (Signed)
Physician Discharge Summary  [name]:[id number] DOB: Jun 17, 1921 DOA: 06/30/2013  PCP: No primary provider on file.  Admit date: 06/30/2013 Discharge date: 07/02/2013  Time spent: 30 minutes  Recommendations for Outpatient Follow-up:  1. Follow upwith PCP in one week 2. Follow up with bmp in one week.   Discharge Diagnoses:  Principal Problem:   Weakness generalized Active Problems:   Alzheimer's dementia   UTI (lower urinary tract infection)   Fall at home   Leukocytosis, unspecified   Discharge Condition: improved.   Diet recommendation: regular  Filed Weights   06/30/13 2335 07/01/13 2030  Weight: 63.504 kg (140 lb) 64.456 kg (142 lb 1.6 oz)    History of present illness:  [name] is a 79 y.o. female who was sent to the ED after being found on the Floor by her home sitter at about 2:30 pm. The sitter arrived and did not know how long she had been on the floor, but reported that the patient complained of having pain in her left thigh and could not stand up. She called EMS and she was brought to the ED. She was evaluated and had a negative CT scan of the Brain for acute findings , and Negative Xrays of the left Hip and pelvis for fractures but her labs revealed a UTI. She was placed on PO Keflex and given IVFs and referred for medical admission.    Hospital Course:   Generalized Weakness- Due to UTI, on rocpehin for 2 days . Urine culture showed E COLI sensitive to levaquin. She was discharged on levaquin. PT /ot eval ordered. Resume home health RN.  2. UTI- Urine C+S sent, on rocpehin for 2 days.  Urine culture showed E COLI sensitive to levaquin.  3. Fall- Physical Therapy consult for Gait evaluation.  4. Leukocytosis- Due to #2. Resolved.  5. Alzheimers Dementia- Chronic on Aricept and Namenda.  7. Hypokalemia: replete as needed.    Procedures: none Consultations:  none  Discharge Exam: Filed Vitals:   07/02/13 0801  BP: 129/61   Pulse: 83  Temp: 98 F (36.7 C)  Resp: 17   General: Alert afebrile comfortable  Cardiovascular: s1s2  Respiratory: ctab  Abdomen: soft NT ND BS+  Musculoskeletal: no pedal edema.     Discharge Instructions     Medication List         donepezil 10 MG tablet  Commonly known as:  ARICEPT  Take 10 mg by mouth daily.     levofloxacin 500 MG tablet  Commonly known as:  LEVAQUIN  Take 1 tablet (500 mg total) by mouth daily.     memantine 5 MG tablet  Commonly known as:  NAMENDA  Take 5 mg by mouth daily.     Vitamin D3 2000 UNITS Tabs  Take 1 tablet by mouth daily.       No Known Allergies    The results of significant diagnostics from this hospitalization (including imaging, microbiology, ancillary and laboratory) are listed below for reference.    Significant Diagnostic Studies: Dg Chest 2 View  06/30/2013   CLINICAL DATA:  Recent traumatic injury with pain  EXAM: CHEST  2 VIEW  COMPARISON:  01/25/2012  FINDINGS: Cardiac shadow is within normal limits. Aortic calcifications are again noted. The lungs are clear. No acute bony abnormality is noted.  IMPRESSION: No active cardiopulmonary disease.   Electronically Signed   By: Alcide Clever M.D.   On: 06/30/2013 18:45   Dg Pelvis 1-2 Views  06/30/2013   CLINICAL DATA:  Left hip pain, status post fall.  EXAM: PELVIS - 1-2 VIEW  COMPARISON:  None.  FINDINGS: There is no evidence of fracture or dislocation. Both femoral heads are seated normally within their respective acetabula. Bilateral axial joint space narrowing is noted, with scattered associated osteophyte formation. The sacroiliac joints are unremarkable in appearance. Mild left convex lumbar scoliosis is noted.  The visualized bowel gas pattern is grossly unremarkable in appearance.  IMPRESSION: 1. No evidence of fracture or dislocation. 2. Mild degenerative change at both hips. 3. Mild left convex lumbar scoliosis noted.   Electronically Signed   By: Roanna Raider  M.D.   On: 06/30/2013 21:33   Ct Head Wo Contrast  06/30/2013   CLINICAL DATA:  Possible slip and fall.  Dementia.  EXAM: CT HEAD WITHOUT CONTRAST  TECHNIQUE: Contiguous axial images were obtained from the base of the skull through the vertex without contrast.  COMPARISON:  01/25/2012.  FINDINGS: No evidence for acute infarction, hemorrhage, mass lesion, hydrocephalus, or extra-axial fluid. Moderately advanced cerebral and cerebellar atrophy. Extensive hypodense the white matter, likely chronic microvascular ischemic change. Vascular calcification. Calvarium intact. No scalp foreign body or visible laceration.  IMPRESSION: No acute intracranial abnormality. Advanced atrophy and small vessel disease. Thank epicenter   Electronically Signed   By: Davonna Belling M.D.   On: 06/30/2013 18:40    Microbiology: Recent Results (from the past 240 hour(s))  URINE CULTURE     Status: None   Collection Time    06/30/13  5:40 PM      Result Value Range Status   Specimen Description URINE, CATHETERIZED   Final   Special Requests NONE   Final   Culture  Setup Time     Final   Value: 06/30/2013 18:49     Performed at [facility] Count     Final   Value: >=100,000 COLONIES/ML     Performed at [facility]   Culture     Final   Value: ESCHERICHIA COLI     Performed at [facility]   Report Status 07/02/2013 FINAL   Final   Organism ID, Bacteria ESCHERICHIA COLI   Final     Labs: Basic Metabolic Panel:  Recent Labs Lab 06/30/13 1745 07/01/13 0555 07/02/13 0500  NA 139 141 139  K 3.4* 3.0* 5.1  CL 99 103 108  CO2 27 25 25   GLUCOSE 120* 97 107*  BUN 10 15 27*  CREATININE 0.58 0.83 0.89  CALCIUM 9.2 8.9 8.7  MG  --  2.1 2.2   Liver Function Tests: No results found for this basename: AST, ALT, ALKPHOS, BILITOT, PROT, ALBUMIN,  in the last 168 hours No results found for this basename: LIPASE, AMYLASE,  in the last 168 hours No results found for this basename:  AMMONIA,  in the last 168 hours CBC:  Recent Labs Lab 06/30/13 1745 07/01/13 0555 07/02/13 0500  WBC 13.3* 10.8* 8.3  NEUTROABS 11.0*  --   --   HGB 16.3* 14.1 13.0  HCT 45.3 40.8 38.6  MCV 87.8 88.7 91.3  PLT 282 283 245   Cardiac Enzymes:  Recent Labs Lab 06/30/13 1745  CKTOTAL 396*   BNP: BNP (last 3 results) No results found for this basename: PROBNP,  in the last 8760 hours CBG: No results found for this basename: GLUCAP,  in the last 168 hours     Signed:  Kayia Billinger  [facility] 07/02/2013, 6:03 PM

## 2014-06-29 ENCOUNTER — Emergency Department (HOSPITAL_COMMUNITY): Payer: Medicare Other

## 2014-06-29 ENCOUNTER — Encounter (HOSPITAL_COMMUNITY): Payer: Self-pay | Admitting: Emergency Medicine

## 2014-06-29 ENCOUNTER — Emergency Department (HOSPITAL_COMMUNITY)
Admission: EM | Admit: 2014-06-29 | Discharge: 2014-06-29 | Disposition: A | Discharge status: Home / Self Care | Payer: Medicare Other | Attending: Emergency Medicine | Admitting: Emergency Medicine

## 2014-06-29 DIAGNOSIS — Z8739 Personal history of other diseases of the musculoskeletal system and connective tissue: Secondary | ICD-10-CM | POA: Insufficient documentation

## 2014-06-29 DIAGNOSIS — Y92009 Unspecified place in unspecified non-institutional (private) residence as the place of occurrence of the external cause: Secondary | ICD-10-CM | POA: Insufficient documentation

## 2014-06-29 DIAGNOSIS — F0390 Unspecified dementia without behavioral disturbance: Secondary | ICD-10-CM | POA: Insufficient documentation

## 2014-06-29 DIAGNOSIS — Y9389 Activity, other specified: Secondary | ICD-10-CM | POA: Insufficient documentation

## 2014-06-29 DIAGNOSIS — Z79899 Other long term (current) drug therapy: Secondary | ICD-10-CM | POA: Insufficient documentation

## 2014-06-29 DIAGNOSIS — S79911A Unspecified injury of right hip, initial encounter: Secondary | ICD-10-CM | POA: Diagnosis present

## 2014-06-29 DIAGNOSIS — T148XXA Other injury of unspecified body region, initial encounter: Secondary | ICD-10-CM

## 2014-06-29 DIAGNOSIS — T148 Other injury of unspecified body region: Secondary | ICD-10-CM | POA: Diagnosis not present

## 2014-06-29 DIAGNOSIS — W1839XA Other fall on same level, initial encounter: Secondary | ICD-10-CM | POA: Diagnosis not present

## 2014-06-29 DIAGNOSIS — W19XXXA Unspecified fall, initial encounter: Secondary | ICD-10-CM

## 2014-06-29 DIAGNOSIS — Y998 Other external cause status: Secondary | ICD-10-CM | POA: Diagnosis not present

## 2014-06-29 LAB — CBC WITH DIFFERENTIAL/PLATELET
Basophils Absolute: 0 10*3/uL (ref 0.0–0.1)
Basophils Relative: 0 % (ref 0–1)
Eosinophils Absolute: 0 10*3/uL (ref 0.0–0.7)
Eosinophils Relative: 0 % (ref 0–5)
HCT: 42.7 % (ref 36.0–46.0)
Hemoglobin: 14.2 g/dL (ref 12.0–15.0)
Lymphocytes Relative: 16 % (ref 12–46)
Lymphs Abs: 1.6 10*3/uL (ref 0.7–4.0)
MCH: 29.4 pg (ref 26.0–34.0)
MCHC: 33.3 g/dL (ref 30.0–36.0)
MCV: 88.4 fL (ref 78.0–100.0)
Monocytes Absolute: 0.7 10*3/uL (ref 0.1–1.0)
Monocytes Relative: 7 % (ref 3–12)
Neutro Abs: 7.4 10*3/uL (ref 1.7–7.7)
Neutrophils Relative %: 77 % (ref 43–77)
Platelets: 263 10*3/uL (ref 150–400)
RBC: 4.83 MIL/uL (ref 3.87–5.11)
RDW: 13.3 % (ref 11.5–15.5)
WBC: 9.8 10*3/uL (ref 4.0–10.5)

## 2014-06-29 LAB — BASIC METABOLIC PANEL
Anion gap: 16 — ABNORMAL HIGH (ref 5–15)
BUN: 15 mg/dL (ref 6–23)
CO2: 23 mEq/L (ref 19–32)
Calcium: 9.2 mg/dL (ref 8.4–10.5)
Chloride: 102 mEq/L (ref 96–112)
Creatinine, Ser: 0.83 mg/dL (ref 0.50–1.10)
GFR calc Af Amer: 69 mL/min — ABNORMAL LOW (ref >90)
GFR calc non Af Amer: 60 mL/min — ABNORMAL LOW (ref >90)
Glucose, Bld: 98 mg/dL (ref 70–99)
POTASSIUM: 4.7 meq/L (ref 3.7–5.3)
Sodium: 141 mEq/L (ref 137–147)

## 2014-06-29 NOTE — Discharge Instructions (Signed)
We saw you in the ER after you had a fall. All the imaging results are normal, no fractures seen. No evidence of brain bleed. Please be very careful with walking, and do everything possible to prevent falls.    Fall Prevention and Home Safety Falls cause injuries and can affect all age groups. It is possible to use preventive measures to significantly decrease the likelihood of falls. There are many simple measures which can make your home safer and prevent falls. OUTDOORS  Repair cracks and edges of walkways and driveways.  Remove high doorway thresholds.  Trim shrubbery on the main path into your home.  Have good outside lighting.  Clear walkways of tools, rocks, debris, and clutter.  Check that handrails are not broken and are securely fastened. Both sides of steps should have handrails.  Have leaves, snow, and ice cleared regularly.  Use sand or salt on walkways during winter months.  In the garage, clean up grease or oil spills. BATHROOM  Install night lights.  Install grab bars by the toilet and in the tub and shower.  Use non-skid mats or decals in the tub or shower.  Place a plastic non-slip stool in the shower to sit on, if needed.  Keep floors dry and clean up all water on the floor immediately.  Remove soap buildup in the tub or shower on a regular basis.  Secure bath mats with non-slip, double-sided rug tape.  Remove throw rugs and tripping hazards from the floors. BEDROOMS  Install night lights.  Make sure a bedside light is easy to reach.  Do not use oversized bedding.  Keep a telephone by your bedside.  Have a firm chair with side arms to use for getting dressed.  Remove throw rugs and tripping hazards from the floor. KITCHEN  Keep handles on pots and pans turned toward the center of the stove. Use back burners when possible.  Clean up spills quickly and allow time for drying.  Avoid walking on wet floors.  Avoid hot utensils and  knives.  Position shelves so they are not too high or low.  Place commonly used objects within easy reach.  If necessary, use a sturdy step stool with a grab bar when reaching.  Keep electrical cables out of the way.  Do not use floor polish or wax that makes floors slippery. If you must use wax, use non-skid floor wax.  Remove throw rugs and tripping hazards from the floor. STAIRWAYS  Never leave objects on stairs.  Place handrails on both sides of stairways and use them. Fix any loose handrails. Make sure handrails on both sides of the stairways are as long as the stairs.  Check carpeting to make sure it is firmly attached along stairs. Make repairs to worn or loose carpet promptly.  Avoid placing throw rugs at the top or bottom of stairways, or properly secure the rug with carpet tape to prevent slippage. Get rid of throw rugs, if possible.  Have an electrician put in a light switch at the top and bottom of the stairs. OTHER FALL PREVENTION TIPS  Wear low-heel or rubber-soled shoes that are supportive and fit well. Wear closed toe shoes.  When using a stepladder, make sure it is fully opened and both spreaders are firmly locked. Do not climb a closed stepladder.  Add color or contrast paint or tape to grab bars and handrails in your home. Place contrasting color strips on first and last steps.  Learn and use mobility aids  as needed. Install an electrical emergency response system.  Turn on lights to avoid dark areas. Replace light bulbs that burn out immediately. Get light switches that glow.  Arrange furniture to create clear pathways. Keep furniture in the same place.  Firmly attach carpet with non-skid or double-sided tape.  Eliminate uneven floor surfaces.  Select a carpet pattern that does not visually hide the edge of steps.  Be aware of all pets. OTHER HOME SAFETY TIPS  Set the water temperature for 120 F (48.8 C).  Keep emergency numbers on or near the  telephone.  Keep smoke detectors on every level of the home and near sleeping areas. Document Released: 06/26/2002 Document Revised: 01/05/2012 Document Reviewed: 09/25/2011 [facility] Patient Information 2015 [place], [place]. This information is not intended to replace advice given to you by your health care provider. Make sure you discuss any questions you have with your health care provider.  Contusion A contusion is a deep bruise. Contusions are the result of an injury that caused bleeding under the skin. The contusion may turn blue, purple, or yellow. Minor injuries will give you a painless contusion, but more severe contusions may stay painful and swollen for a few weeks.  CAUSES  A contusion is usually caused by a blow, trauma, or direct force to an area of the body. SYMPTOMS   Swelling and redness of the injured area.  Bruising of the injured area.  Tenderness and soreness of the injured area.  Pain. DIAGNOSIS  The diagnosis can be made by taking a history and physical exam. An X-ray, CT scan, or MRI may be needed to determine if there were any associated injuries, such as fractures. TREATMENT  Specific treatment will depend on what area of the body was injured. In general, the best treatment for a contusion is resting, icing, elevating, and applying cold compresses to the injured area. Over-the-counter medicines may also be recommended for pain control. Ask your caregiver what the best treatment is for your contusion. HOME CARE INSTRUCTIONS   Put ice on the injured area.  Put ice in a plastic bag.  Place a towel between your skin and the bag.  Leave the ice on for 15-20 minutes, 3-4 times a day, or as directed by your health care provider.  Only take over-the-counter or prescription medicines for pain, discomfort, or fever as directed by your caregiver. Your caregiver may recommend avoiding anti-inflammatory medicines (aspirin, ibuprofen, and naproxen) for 48 hours because  these medicines may increase bruising.  Rest the injured area.  If possible, elevate the injured area to reduce swelling. SEEK IMMEDIATE MEDICAL CARE IF:   You have increased bruising or swelling.  You have pain that is getting worse.  Your swelling or pain is not relieved with medicines. MAKE SURE YOU:   Understand these instructions.  Will watch your condition.  Will get help right away if you are not doing well or get worse. Document Released: 04/15/2005 Document Revised: 07/11/2013 Document Reviewed: 05/11/2011 [facility] Patient Information 2015 [place], [place]. This information is not intended to replace advice given to you by your health care provider. Make sure you discuss any questions you have with your health care provider.

## 2014-06-29 NOTE — ED Notes (Signed)
Per EMS mailman found pt laying on floor with walker by side and states pt lost her balance. Neighbors report pt fall frequently and has limp to right hip normally. Pt has Alzheimer disease and lives at home by herself; per neighbors grandson checks on her daily.

## 2014-06-29 NOTE — ED Notes (Signed)
Bed: [facility] Expected date:  Expected time:  Means of arrival:  Comments: EMS fall 

## 2014-06-29 NOTE — ED Provider Notes (Signed)
CSN: [id number]     Arrival date & time 06/29/14  1355 History   First MD Initiated Contact with Patient 06/29/14 1431     Chief Complaint  Patient presents with  . Fall     (Consider location/radiation/quality/duration/timing/severity/associated sxs/prior Treatment) HPI Comments: Pt comes in with cc of fall. LEVEL 5 CAVEAT - DUE TO DEMENTIA. Pt was brought to the ER after she was found on the floor. Pt has hx of dementia and lives by herself, with a sitter and son watching her closely. Reportedly, pt was found on the floor by mailman, and EMS called. Pt's sitter is at bedside now. Pt is demented. Unable to give any history. Sitter states that pt is at baseline normal currently. She has seen pt this AM, and pt was normal. There have been no recent infections, fevers or complains of pt. Pt has a baseline limp on right side.  Patient is a 80 y.o. female presenting with fall. The history is provided by the patient.  Fall    Past Medical History  Diagnosis Date  . Dementia   . Arthritis    History reviewed. No pertinent past surgical history. No family history on file. History  Substance Use Topics  . Smoking status: Never Smoker   . Smokeless tobacco: Not on file  . Alcohol Use: Not on file   OB History    No data available     Review of Systems  Unable to perform ROS: Dementia      Allergies  Review of patient's allergies indicates no known allergies.  Home Medications   Prior to Admission medications   Medication Sig Start Date End Date Taking? Authorizing Provider  Cholecalciferol (VITAMIN D3) 2000 UNITS TABS Take 1 tablet by mouth daily.    Historical Provider, MD  donepezil (ARICEPT) 10 MG tablet Take 10 mg by mouth daily.     Historical Provider, MD  levofloxacin (LEVAQUIN) 500 MG tablet Take 1 tablet (500 mg total) by mouth daily. 07/02/13   Kathlen ModyVijaya Akula, MD  memantine (NAMENDA) 5 MG tablet Take 5 mg by mouth daily.      Historical Provider, MD   BP 145/75  mmHg  Pulse 78  Temp(Src) 97.4 F (36.3 C) (Oral)  Resp 18  SpO2 97% Physical Exam  Constitutional: She is oriented to person, place, and time. She appears well-developed and well-nourished.  HENT:  Head: Normocephalic and atraumatic.  Eyes: EOM are normal. Pupils are equal, round, and reactive to light.  Neck: Neck supple.  No midline c-spine tenderness, pt able to turn head to 45 degrees bilaterally without any pain and able to flex neck to the chest and extend without any pain or neurologic symptoms.   Cardiovascular: Normal rate, regular rhythm and normal heart sounds.   No murmur heard. Pulmonary/Chest: Effort normal. No respiratory distress.  Abdominal: Soft. She exhibits no distension. There is no tenderness. There is no rebound and no guarding.  Musculoskeletal:  Tenderness on the right hip to palpation. No deformity.  Head to toe evaluation shows no hematoma, bleeding of the scalp, no facial abrasions, step offs, crepitus, no tenderness to palpation of the bilateral upper and lower extremities, no gross deformities, no chest tenderness, no pelvic pain.   Neurological: She is alert and oriented to person, place, and time.  Skin: Skin is warm and dry.  Nursing note and vitals reviewed.   ED Course  Procedures (including critical care time) Labs Review Labs Reviewed  BASIC METABOLIC PANEL - Abnormal;  Notable for the following:    GFR calc non Af Amer 60 (*)    GFR calc Af Amer 69 (*)    Anion gap 16 (*)    All other components within normal limits  CBC WITH DIFFERENTIAL    Imaging Review Dg Hip Complete Right  06/29/2014   CLINICAL DATA:  80 year old with dementia who fell earlier today, details unknown. Right hip pain. Initial encounter.  EXAM: RIGHT HIP - COMPLETE 2+ VIEW  COMPARISON:  None.  FINDINGS: No evidence of acute fracture or dislocation. Near complete loss of the medial joint space with associated hypertrophic spurring involving the femoral head.  Included  AP pelvis demonstrates intact sacroiliac joints and symphysis pubis. Symmetric severe medial joint space narrowing in the contralateral left hip. Bone mineral density relatively well preserved for age.  IMPRESSION: 1. No acute osseous abnormality. 2. Severe osteoarthritis.   Electronically Signed   By: Hulan Saashomas  Lawrence M.D.   On: 06/29/2014 15:07   Ct Head Wo Contrast  06/29/2014   CLINICAL DATA:  80 year old female found down in yard. Initial encounter.  EXAM: CT HEAD WITHOUT CONTRAST  TECHNIQUE: Contiguous axial images were obtained from the base of the skull through the vertex without intravenous contrast.  COMPARISON:  Head CT 06/30/2013 and earlier.  FINDINGS: Visualized paranasal sinuses and mastoids are clear. No acute orbit or scalp soft tissue findings identified. Calcified atherosclerosis at the skull base. No acute osseous abnormality identified.  Stable cerebral volume. No ventriculomegaly. No midline shift, mass effect, or evidence of intracranial mass lesion. No acute intracranial hemorrhage identified.  Increased left thalamic hypodensity since 2014. Chronic patchy white matter hypodensity elsewhere. No evidence of cortically based acute infarction identified. No suspicious intracranial vascular hyperdensity.  IMPRESSION: 1. No acute intracranial abnormality. No acute traumatic injury identified. 2. Mildly progressed small vessel ischemia since 2014.   Electronically Signed   By: Augusto GambleLee  Hall M.D.   On: 06/29/2014 15:12     EKG Interpretation None      MDM   Final diagnoses:  Fall, initial encounter  Contusion    Pt comes in w/ cc of fall. DDx includes: - Mechanical falls - ICH - Fractures - Contusions - Soft tissue injury  Pt has R hip tenderness. With her age, and fall - CT head ordered, and is neg. Hip xray for hip pain shows no fx. Pt ambulated -with a limp - which per caregiver is not new. Will d.c to her sitter, who agrees that pt is at baseline normal for self. UA  ordered- we didn't get a sample - we will not be getting i&o cath - pt is in a hallway bed.   Derwood KaplanAnkit Cena Bruhn, MD 06/29/14 [phone]

## 2014-08-31 ENCOUNTER — Emergency Department (HOSPITAL_COMMUNITY): Payer: Medicare Other

## 2014-08-31 ENCOUNTER — Encounter (HOSPITAL_COMMUNITY): Payer: Self-pay

## 2014-08-31 ENCOUNTER — Inpatient Hospital Stay (HOSPITAL_COMMUNITY)
Admission: EM | Admit: 2014-08-31 | Discharge: 2014-09-01 | DRG: 312 | Disposition: A | Discharge status: Home / Self Care | Payer: Medicare Other | Attending: Internal Medicine | Admitting: Internal Medicine

## 2014-08-31 ENCOUNTER — Inpatient Hospital Stay (HOSPITAL_COMMUNITY): Payer: Medicare Other

## 2014-08-31 DIAGNOSIS — Z79899 Other long term (current) drug therapy: Secondary | ICD-10-CM | POA: Diagnosis not present

## 2014-08-31 DIAGNOSIS — R410 Disorientation, unspecified: Secondary | ICD-10-CM

## 2014-08-31 DIAGNOSIS — G9340 Encephalopathy, unspecified: Secondary | ICD-10-CM | POA: Diagnosis present

## 2014-08-31 DIAGNOSIS — F0280 Dementia in other diseases classified elsewhere without behavioral disturbance: Secondary | ICD-10-CM | POA: Diagnosis present

## 2014-08-31 DIAGNOSIS — R55 Syncope and collapse: Secondary | ICD-10-CM | POA: Diagnosis present

## 2014-08-31 DIAGNOSIS — G309 Alzheimer's disease, unspecified: Secondary | ICD-10-CM | POA: Diagnosis present

## 2014-08-31 DIAGNOSIS — B961 Klebsiella pneumoniae [K. pneumoniae] as the cause of diseases classified elsewhere: Secondary | ICD-10-CM | POA: Diagnosis present

## 2014-08-31 DIAGNOSIS — R680 Hypothermia, not associated with low environmental temperature: Secondary | ICD-10-CM | POA: Diagnosis present

## 2014-08-31 DIAGNOSIS — N390 Urinary tract infection, site not specified: Secondary | ICD-10-CM | POA: Diagnosis present

## 2014-08-31 DIAGNOSIS — R531 Weakness: Secondary | ICD-10-CM | POA: Diagnosis present

## 2014-08-31 DIAGNOSIS — M1990 Unspecified osteoarthritis, unspecified site: Secondary | ICD-10-CM | POA: Diagnosis present

## 2014-08-31 LAB — CBC
HCT: 43.3 % (ref 36.0–46.0)
Hemoglobin: 15.1 g/dL — ABNORMAL HIGH (ref 12.0–15.0)
MCH: 30.3 pg (ref 26.0–34.0)
MCHC: 34.9 g/dL (ref 30.0–36.0)
MCV: 86.8 fL (ref 78.0–100.0)
PLATELETS: 320 10*3/uL (ref 150–400)
RBC: 4.99 MIL/uL (ref 3.87–5.11)
RDW: 13.7 % (ref 11.5–15.5)
WBC: 12 10*3/uL — AB (ref 4.0–10.5)

## 2014-08-31 LAB — I-STAT TROPONIN, ED: Troponin i, poc: 0.05 ng/mL (ref 0.00–0.08)

## 2014-08-31 LAB — URINALYSIS, ROUTINE W REFLEX MICROSCOPIC
Bilirubin Urine: NEGATIVE
Glucose, UA: NEGATIVE mg/dL
Ketones, ur: 15 mg/dL — AB
NITRITE: NEGATIVE
PROTEIN: 30 mg/dL — AB
SPECIFIC GRAVITY, URINE: 1.012 (ref 1.005–1.030)
UROBILINOGEN UA: 1 mg/dL (ref 0.0–1.0)
pH: 7 (ref 5.0–8.0)

## 2014-08-31 LAB — I-STAT CG4 LACTIC ACID, ED: Lactic Acid, Venous: 2.6 mmol/L (ref 0.5–2.0)

## 2014-08-31 LAB — I-STAT CHEM 8, ED
BUN: 13 mg/dL (ref 6–23)
CREATININE: 0.8 mg/dL (ref 0.50–1.10)
Calcium, Ion: 1.04 mmol/L — ABNORMAL LOW (ref 1.13–1.30)
Chloride: 104 mmol/L (ref 96–112)
Glucose, Bld: 183 mg/dL — ABNORMAL HIGH (ref 70–99)
HCT: 47 % — ABNORMAL HIGH (ref 36.0–46.0)
HEMOGLOBIN: 16 g/dL — AB (ref 12.0–15.0)
Potassium: 3 mmol/L — ABNORMAL LOW (ref 3.5–5.1)
SODIUM: 141 mmol/L (ref 135–145)
TCO2: 20 mmol/L (ref 0–100)

## 2014-08-31 LAB — COMPREHENSIVE METABOLIC PANEL
ALK PHOS: 82 U/L (ref 39–117)
ALT: 12 U/L (ref 0–35)
AST: 23 U/L (ref 0–37)
Albumin: 3.8 g/dL (ref 3.5–5.2)
Anion gap: 11 (ref 5–15)
BUN: 12 mg/dL (ref 6–23)
CO2: 23 mmol/L (ref 19–32)
Calcium: 9.3 mg/dL (ref 8.4–10.5)
Chloride: 105 mmol/L (ref 96–112)
Creatinine, Ser: 0.87 mg/dL (ref 0.50–1.10)
GFR calc Af Amer: 65 mL/min — ABNORMAL LOW (ref >90)
GFR calc non Af Amer: 57 mL/min — ABNORMAL LOW (ref >90)
Glucose, Bld: 182 mg/dL — ABNORMAL HIGH (ref 70–99)
POTASSIUM: 3.2 mmol/L — AB (ref 3.5–5.1)
SODIUM: 139 mmol/L (ref 135–145)
TOTAL PROTEIN: 7.4 g/dL (ref 6.0–8.3)
Total Bilirubin: 0.8 mg/dL (ref 0.3–1.2)

## 2014-08-31 LAB — MRSA PCR SCREENING: MRSA by PCR: NEGATIVE

## 2014-08-31 LAB — CBG MONITORING, ED: GLUCOSE-CAPILLARY: 171 mg/dL — AB (ref 70–99)

## 2014-08-31 LAB — PROTIME-INR
INR: 1.3 (ref 0.00–1.49)
PROTHROMBIN TIME: 16.3 s — AB (ref 11.6–15.2)

## 2014-08-31 LAB — DIFFERENTIAL
Basophils Absolute: 0.1 10*3/uL (ref 0.0–0.1)
Basophils Relative: 1 % (ref 0–1)
Eosinophils Absolute: 0.1 10*3/uL (ref 0.0–0.7)
Eosinophils Relative: 1 % (ref 0–5)
LYMPHS PCT: 31 % (ref 12–46)
Lymphs Abs: 3.7 10*3/uL (ref 0.7–4.0)
Monocytes Absolute: 1.1 10*3/uL — ABNORMAL HIGH (ref 0.1–1.0)
Monocytes Relative: 9 % (ref 3–12)
NEUTROS ABS: 7.1 10*3/uL (ref 1.7–7.7)
Neutrophils Relative %: 58 % (ref 43–77)

## 2014-08-31 LAB — RAPID URINE DRUG SCREEN, HOSP PERFORMED
Amphetamines: NOT DETECTED
BENZODIAZEPINES: NOT DETECTED
Barbiturates: NOT DETECTED
Cocaine: NOT DETECTED
Opiates: NOT DETECTED
Tetrahydrocannabinol: NOT DETECTED

## 2014-08-31 LAB — URINE MICROSCOPIC-ADD ON

## 2014-08-31 LAB — ETHANOL: Alcohol, Ethyl (B): <5 mg/dL (ref 0–9)

## 2014-08-31 LAB — APTT: aPTT: 32 seconds (ref 24–37)

## 2014-08-31 MED ORDER — ASPIRIN 300 MG RE SUPP
300.0000 mg | Freq: Every day | RECTAL | Status: DC
  Filled 2014-08-31 (×3): qty 1

## 2014-08-31 MED ORDER — POTASSIUM CHLORIDE 10 MEQ/100ML IV SOLN
10.0000 meq | INTRAVENOUS | Status: AC
  Administered 2014-09-01 (×3): 10 meq via INTRAVENOUS
  Filled 2014-08-31 (×4): qty 100

## 2014-08-31 MED ORDER — ASPIRIN 325 MG PO TABS
325.0000 mg | ORAL_TABLET | Freq: Every day | ORAL | Status: DC
  Administered 2014-09-01: 325 mg via ORAL
  Filled 2014-08-31 (×3): qty 1

## 2014-08-31 MED ORDER — MEMANTINE HCL 5 MG PO TABS
5.0000 mg | ORAL_TABLET | Freq: Every day | ORAL | Status: DC
Start: 2014-09-01 — End: 2014-09-01
  Administered 2014-09-01: 5 mg via ORAL
  Filled 2014-08-31: qty 1

## 2014-08-31 MED ORDER — DEXTROSE 5 % IV SOLN
1.0000 g | Freq: Once | INTRAVENOUS | Status: AC
  Administered 2014-08-31: 1 g via INTRAVENOUS
  Filled 2014-08-31: qty 10

## 2014-08-31 MED ORDER — SODIUM CHLORIDE 0.9 % IV BOLUS (SEPSIS)
500.0000 mL | Freq: Once | INTRAVENOUS | Status: AC
  Administered 2014-08-31: 500 mL via INTRAVENOUS

## 2014-08-31 MED ORDER — DONEPEZIL HCL 10 MG PO TABS
10.0000 mg | ORAL_TABLET | Freq: Every day | ORAL | Status: DC
  Administered 2014-09-01: 10 mg via ORAL
  Filled 2014-08-31: qty 1

## 2014-08-31 MED ORDER — STROKE: EARLY STAGES OF RECOVERY BOOK
Freq: Once | Status: DC
  Filled 2014-08-31: qty 1

## 2014-08-31 MED ORDER — SODIUM CHLORIDE 0.9 % IV SOLN
INTRAVENOUS | Status: DC
  Administered 2014-08-31: 23:00:00 via INTRAVENOUS

## 2014-08-31 MED ORDER — CEFTRIAXONE SODIUM IN DEXTROSE 20 MG/ML IV SOLN
1.0000 g | INTRAVENOUS | Status: DC
  Filled 2014-08-31: qty 50

## 2014-08-31 MED ORDER — ENOXAPARIN SODIUM 40 MG/0.4ML ~~LOC~~ SOLN
40.0000 mg | SUBCUTANEOUS | Status: DC
  Administered 2014-09-01: 40 mg via SUBCUTANEOUS
  Filled 2014-08-31 (×2): qty 0.4

## 2014-08-31 MED ORDER — SODIUM CHLORIDE 0.9 % IV BOLUS (SEPSIS): 500.0000 mL | Freq: Once | INTRAVENOUS | Status: DC

## 2014-08-31 MED ORDER — ONDANSETRON HCL 4 MG/2ML IJ SOLN
4.0000 mg | Freq: Once | INTRAMUSCULAR | Status: AC
  Administered 2014-08-31: 4 mg via INTRAVENOUS
  Filled 2014-08-31: qty 2

## 2014-08-31 NOTE — ED Notes (Signed)
Dr. Patel at bedside 

## 2014-08-31 NOTE — ED Provider Notes (Signed)
CSN: [id number]     Arrival date & time 08/31/14  1733 History   First MD Initiated Contact with Patient 08/31/14 1739     Chief Complaint  Patient presents with  . Code Stroke     (Consider location/radiation/quality/duration/timing/severity/associated sxs/prior Treatment) Patient is a 81 y.o. female presenting with altered mental status. The history is provided by the patient.  Altered Mental Status Presenting symptoms: confusion, disorientation and partial responsiveness   Severity:  Moderate Most recent episode:  Today Episode history:  Single Duration:  1 hour Timing:  Constant Progression:  Unchanged Chronicity:  New Context: dementia   Context: not a recent illness   Associated symptoms: vomiting and weakness   Associated symptoms: no difficulty breathing, no fever and no rash     Past Medical History  Diagnosis Date  . Dementia   . Arthritis    History reviewed. No pertinent past surgical history. History reviewed. No pertinent family history. History  Substance Use Topics  . Smoking status: Never Smoker   . Smokeless tobacco: Not on file  . Alcohol Use: Not on file   OB History    No data available     Review of Systems  Unable to perform ROS: Dementia  Constitutional: Negative for fever.  Gastrointestinal: Positive for vomiting.  Skin: Negative for rash.  Neurological: Positive for weakness.  Psychiatric/Behavioral: Positive for confusion.      Allergies  Review of patient's allergies indicates no known allergies.  Home Medications   Prior to Admission medications   Medication Sig Start Date End Date Taking? Authorizing Provider  Cholecalciferol (VITAMIN D3) 2000 UNITS TABS Take 1 tablet by mouth daily.    Historical Provider, MD  donepezil (ARICEPT) 10 MG tablet Take 10 mg by mouth daily.     Historical Provider, MD  levofloxacin (LEVAQUIN) 500 MG tablet Take 1 tablet (500 mg total) by mouth daily. Patient not taking: Reported on 06/29/2014  07/02/13   Kathlen Mody, MD  memantine (NAMENDA) 5 MG tablet Take 5 mg by mouth daily.      Historical Provider, MD   BP 131/105 mmHg  Pulse 90  Temp(Src) 95 F (35 C) (Rectal)  Resp 24  SpO2 97% Physical Exam  Constitutional: She appears well-developed and well-nourished. No distress.  HENT:  Head: Normocephalic.  Mouth/Throat: No oropharyngeal exudate.  Eyes: Conjunctivae are normal. Pupils are equal, round, and reactive to light.  Neck: Neck supple.  Cardiovascular: Normal rate, normal heart sounds and intact distal pulses.  Exam reveals no friction rub.   No murmur heard. Pulmonary/Chest: Effort normal. No respiratory distress. She has no wheezes. She has no rales.  Abdominal: Soft. She exhibits no distension. There is no tenderness.  Musculoskeletal: She exhibits no edema.  Neurological:  Drowsy, but protecting airway. Opens eyes intermittently to calling her name. Withdraws to noxious stimuli in all four extremities and MAE. No nystagmus. PERRL. Face symmetric.  Skin: Skin is warm.  Nursing note and vitals reviewed.   ED Course  Procedures (including critical care time) Labs Review Labs Reviewed  PROTIME-INR - Abnormal; Notable for the following:    Prothrombin Time 16.3 (*)    All other components within normal limits  CBC - Abnormal; Notable for the following:    WBC 12.0 (*)    Hemoglobin 15.1 (*)    All other components within normal limits  DIFFERENTIAL - Abnormal; Notable for the following:    Monocytes Absolute 1.1 (*)    All other components within normal  limits  COMPREHENSIVE METABOLIC PANEL - Abnormal; Notable for the following:    Potassium 3.2 (*)    Glucose, Bld 182 (*)    GFR calc non Af Amer 57 (*)    GFR calc Af Amer 65 (*)    All other components within normal limits  URINALYSIS, ROUTINE W REFLEX MICROSCOPIC - Abnormal; Notable for the following:    Hgb urine dipstick SMALL (*)    Ketones, ur 15 (*)    Protein, ur 30 (*)    Leukocytes, UA  MODERATE (*)    All other components within normal limits  URINE MICROSCOPIC-ADD ON - Abnormal; Notable for the following:    Bacteria, UA FEW (*)    Casts HYALINE CASTS (*)    All other components within normal limits  I-STAT CHEM 8, ED - Abnormal; Notable for the following:    Potassium 3.0 (*)    Glucose, Bld 183 (*)    Calcium, Ion 1.04 (*)    Hemoglobin 16.0 (*)    HCT 47.0 (*)    All other components within normal limits  CBG MONITORING, ED - Abnormal; Notable for the following:    Glucose-Capillary 171 (*)    All other components within normal limits  CULTURE, BLOOD (ROUTINE X 2)  CULTURE, BLOOD (ROUTINE X 2)  URINE CULTURE  ETHANOL  APTT  URINE RAPID DRUG SCREEN (HOSP PERFORMED)  TSH  I-STAT TROPOININ, ED  I-STAT TROPOININ, ED  I-STAT CG4 LACTIC ACID, ED    Imaging Review Ct Head Wo Contrast  08/31/2014   CLINICAL DATA:  Patient with sudden onset generalized weakness, unresponsive. Code stroke.  EXAM: CT HEAD WITHOUT CONTRAST  TECHNIQUE: Contiguous axial images were obtained from the base of the skull through the vertex without intravenous contrast.  COMPARISON:  06/29/2014, brain cc  FINDINGS: The ventricles and sulci are prominent, compatible with atrophy. Multiple bilateral chronic appearing lacunar infarcts are demonstrated within the bilateral basal ganglia. Old left thalamic infarct. Periventricular and subcortical white matter hypodensity compatible with chronic small vessel ischemic change. No definite evidence for acute cortically based infarct, intracranial hemorrhage, mass lesion or mass-effect. Bilateral cataract surgery. Paranasal sinuses unremarkable. Mastoid air cells are well aerated. Calvarium is intact.  IMPRESSION: No acute intracranial process.  Cortical atrophy, volume loss and chronic small vessel ischemic change.  Bilateral chronic appearing lacunar infarcts.  Critical Value/emergent results were called by telephone at the time of interpretation on  08/31/2014 at 5:55 pm to Dr. Hosie Poisson, who verbally acknowledged these results.   Electronically Signed   By: Annia Belt M.D.   On: 08/31/2014 17:56   Dg Chest Portable 1 View  08/31/2014   CLINICAL DATA:  Syncopal episode. Altered mental status. Diaphoresis.  EXAM: PORTABLE CHEST - 1 VIEW  COMPARISON:  06/30/2013  FINDINGS: Low lung volumes noted, however both lungs remain clear. No evidence of pneumothorax or pleural effusion. Heart size is within normal limits.  IMPRESSION: Low lung volumes.  No active disease.   Electronically Signed   By: Myles Rosenthal M.D.   On: 08/31/2014 18:56     EKG Interpretation   Date/Time:  Friday August 31 2014 17:52:08 EST Ventricular Rate:  124 PR Interval:    QRS Duration: 111 QT Interval:  364 QTC Calculation: 523 R Axis:   -16 Text Interpretation:  Atrial fibrillation Ventricular premature complex  Borderline left axis deviation Low voltage, precordial leads  Repolarization abnormality, prob rate related Prolonged QT interval  Abnormal ekg Confirmed by BEATON  MD, Molly MaduroOBERT [phone]([id number]) on 08/31/2014 6:16:20  PM      MDM   Final diagnoses:  UTI (lower urinary tract infection)  Weakness generalized    81 yo F with PMHx of severe dementia and prior syncopal episode in setting of BM who presents with generalized weakness, drowsiness, and syncopal episode with diaphoresis. See HPI above. On arrival, T 94.56F, HR 98. RR 19, BP 169/91, satting 100% on 2L [place]. Exam as above, with generalized drowsiness but no focal neuro deficits.   Pt's presentation is most c/f possible CVA vs ICH given syncope and now AMS. CODE STROKE initiated and pt taken immediately to CT scanner. DDx includes encephalopathy 2/2 UTI, PNA, metabolic abnormality/electrolyte disorder, ACS/cardiac component. Will send broad labs, monitor for airway protection. Of note, pt in AFIb on arrival, raising concern for possible intra-atrial clot leading to CVA.   CT Head negative. Per Neuro, family would  not desire tPA so plan is for MRI. Labs as above, CBC with mild leukocytosis, BMP unremarkable, and cath UA with sx to suggest UTI. Will obtain BCx, UCx, and give Rocephin. Gentle fluids given. Per discussion with family, pt would desire intubation as last resort but only transiently, with no CPR. Pt protecting airway at this time[facility]   Clinical Impression: 1. UTI (lower urinary tract infection)   2. Weakness generalized     Disposition: Admit  Condition: Good  Pt seen in conjunction with Dr. Radford PaxBeaton.     Shaune Pollackameron Shaunee Mulkern, MD 09/01/14 [id number]  Nelia Shiobert L Beaton, MD 09/01/14 [phone]

## 2014-08-31 NOTE — ED Notes (Signed)
Phlebotomy at bedside obtained one set working on obtaining second set.

## 2014-08-31 NOTE — ED Notes (Signed)
bair hugger placed on pt. 

## 2014-08-31 NOTE — H&P (Signed)
[facility] History and Physical  Patient: [name]  MRN: [id number]  DOB: [birth year]  DOS: the patient was seen and examined on 08/31/2014 PCP: No primary care provider on file.  Chief Complaint: Passing out episode  HPI: [name] is a 81 y.o. female with Past medical history of dementia. The patient is presenting with an episode of passing out. The episode was witnessed by caregiver who is with the patient 24 7. Patient went to a bowel movement and after that when she was returning back she started having complaints of generalized weakness and had passed out. As per patient's son [name] event occurred 2 months ago. Both time she had passing out episode which was followed by 2 hour period of altered mental status. She also had an episode of nausea with vomiting during last episode as well as this episode. Prior to this there was no fever no chills no changes in her medication no diarrhea no nausea no vomiting. Patient has been eating okay. No fall no trauma no injury no head injury tonight.  The patient is coming from home And at her baseline dependent for most of her ADL.  Review of Systems: as mentioned in the history of present illness.  A Comprehensive review of the other systems is negative.  Past Medical History  Diagnosis Date  . Dementia   . Arthritis    History reviewed. No pertinent past surgical history. Social History:  reports that she has never smoked. She does not have any smokeless tobacco history on file. Her alcohol and drug histories are not on file.  No Known Allergies  History reviewed. No pertinent family history.  Prior to Admission medications   Medication Sig Start Date End Date Taking? Authorizing Provider  Cholecalciferol (VITAMIN D3) 2000 UNITS TABS Take 1 tablet by mouth daily.    Historical Provider, MD  donepezil (ARICEPT) 10 MG tablet Take 10 mg by mouth daily.     Historical Provider, MD  memantine (NAMENDA) 5 MG  tablet Take 5 mg by mouth daily.      Historical Provider, MD    Physical Exam: Filed Vitals:   08/31/14 1900 08/31/14 1907 08/31/14 1915 08/31/14 2000  BP: 158/76 158/76 119/71 136/76  Pulse: 94 88 87 85  Temp: 95.9 F (35.5 C) 95.9 F (35.5 C) 96.1 F (35.6 C) 96.4 F (35.8 C)  TempSrc:  Core (Comment)    Resp: SpO2: 100% 100% 100% 100%    General: Alert, Awake and Oriented to Time, Place and Person. Appear in mild distress Eyes: PERRL ENT: Oral Mucosa clear moist. Neck: no JVD Cardiovascular: S1 and S2 Present, aortic systolic Murmur, Peripheral Pulses Present Respiratory: Bilateral Air entry equal and Decreased, Clear to Auscultation, noCrackles, no wheezes Abdomen: Bowel Sound present, Soft and non tender Skin: no Rash Extremities: no Pedal edema, no calf tenderness Neurologic: Grossly no focal neuro deficit other than generalized bilateral weakness  Labs on Admission:  CBC:  Recent Labs Lab 08/31/14 1751 08/31/14 1758  WBC 12.0*  --   NEUTROABS 7.1  --   HGB 15.1* 16.0*  HCT 43.3 47.0*  MCV 86.8  --   PLT 320  --     CMP     Component Value Date/Time   NA 141 08/31/2014 1758   K 3.0* 08/31/2014 1758   CL 104 08/31/2014 1758   CO2 23 08/31/2014 1751   GLUCOSE 183* 08/31/2014 1758   BUN 13 08/31/2014 1758  CREATININE 0.80 08/31/2014 1758   CALCIUM 9.3 08/31/2014 1751   PROT 7.4 08/31/2014 1751   ALBUMIN 3.8 08/31/2014 1751   AST 23 08/31/2014 1751   ALT 12 08/31/2014 1751   ALKPHOS 82 08/31/2014 1751   BILITOT 0.8 08/31/2014 1751   GFRNONAA 57* 08/31/2014 1751   GFRAA 65* 08/31/2014 1751    No results for input(s): LIPASE, AMYLASE in the last 168 hours.  No results for input(s): CKTOTAL, CKMB, CKMBINDEX, TROPONINI in the last 168 hours. BNP (last 3 results) No results for input(s): BNP in the last 8760 hours.  ProBNP (last 3 results) No results for input(s): PROBNP in the last 8760 hours.   Radiological Exams on  Admission: Ct Head Wo Contrast  08/31/2014   CLINICAL DATA:  Patient with sudden onset generalized weakness, unresponsive. Code stroke.  EXAM: CT HEAD WITHOUT CONTRAST  TECHNIQUE: Contiguous axial images were obtained from the base of the skull through the vertex without intravenous contrast.  COMPARISON:  06/29/2014, brain cc  FINDINGS: The ventricles and sulci are prominent, compatible with atrophy. Multiple bilateral chronic appearing lacunar infarcts are demonstrated within the bilateral basal ganglia. Old left thalamic infarct. Periventricular and subcortical white matter hypodensity compatible with chronic small vessel ischemic change. No definite evidence for acute cortically based infarct, intracranial hemorrhage, mass lesion or mass-effect. Bilateral cataract surgery. Paranasal sinuses unremarkable. Mastoid air cells are well aerated. Calvarium is intact.  IMPRESSION: No acute intracranial process.  Cortical atrophy, volume loss and chronic small vessel ischemic change.  Bilateral chronic appearing lacunar infarcts.  Critical Value/emergent results were called by telephone at the time of interpretation on 08/31/2014 at 5:55 pm to Dr. Hosie PoissonSumner, who verbally acknowledged these results.   Electronically Signed   By: Annia Beltrew  Davis M.D.   On: 08/31/2014 17:56   Mr Brain Wo Contrast  08/31/2014   CLINICAL DATA:  Generalized weakness and altered mental status after bowel movement this afternoon. Baseline severe dementia.  EXAM: MRI HEAD WITHOUT CONTRAST  TECHNIQUE: Multiplanar, multiecho pulse sequences of the brain and surrounding structures were obtained without intravenous contrast.  COMPARISON:  CT of the head August 31, 2014 at 1747 hours  FINDINGS: No reduced diffusion to suggest acute ischemia. No susceptibility artifact to suggest hemorrhage.  Moderate ventriculomegaly, likely on the basis of global parenchymal brain volume loss as there is overall commensurate enlargement of cerebral sulci and  cerebellar folia. Patchy to confluent supratentorial white matter FLAIR T2 hyperintensities. No midline shift, mass effect or mass lesions. Curvilinear T1 shortening within mesial LEFT occipital lobe, with minimal apparent volume loss. Remote bilateral tiny basal ganglia and RIGHT thalamus lacunar infarcts.  No abnormal extra-axial fluid collections. Observed intracranial vascular flow voids at the skullbase with dolichoectasia.  Status post bilateral ocular lens implants. Patient is edentulous. Trace paranasal sinus mucosal thickening, trace LEFT mastoid effusion. No abnormal sellar expansion. No cerebellar tonsillar ectopia. No suspicious calvarial bone marrow signal. Severe degenerative change of the midcervical spine with grade 1 C3-4 anterolisthesis partially imaged from at least mild canal stenosis.  IMPRESSION: No acute intracranial process, specifically no evidence of acute ischemia.  Involutional changes. Moderate to severe white matter changes can be seen with chronic small vessel ischemic disease.  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 Bilateral basal ganglia and RIGHT thalamus remote lacunar infarcts and/or perivascular spaces.   Electronically Signed   By: Awilda Metroourtnay  Bloomer   On: 08/31/2014 21:59   Dg Chest Portable 1 View  08/31/2014   CLINICAL DATA:  Syncopal episode. Altered mental status. Diaphoresis.  EXAM: PORTABLE CHEST - 1 VIEW  COMPARISON:  06/30/2013  FINDINGS: Low lung volumes noted, however both lungs remain clear. No evidence of pneumothorax or pleural effusion. Heart size is within normal limits.  IMPRESSION: Low lung volumes.  No active disease.   Electronically Signed   By: Myles Rosenthal M.D.   On: 08/31/2014 18:56    EKG: Independently reviewed. atrial fibrillation, initially date 124 later in sinus on monitor.  Assessment/Plan Principal Problem:   Acute encephalopathy Active Problems:   Syncope   Alzheimer's dementia   UTI (lower urinary tract infection)    Weakness generalized   1. Acute encephalopathy  Hypothermia  The patient is presenting with an episode of syncope. This occurred after bowel movement. CT of the head as well as MRI of the brain are negative for any acute abnormality. Patient was brought in as a code stroke. Neurology evaluated the patient. Recommended TSH. Possible UTI. We will check EEG in the morning, serial neuro checks. Initially patient was accepted to step down as the patient was drowsy and difficult to arouse able. At the time of my evaluation the patient was awake and oriented to herself. Possibility of stroke is less likely.  2. Syncope. Most likely cardiac in nature. We will get echocardiogram in the morning. Check EEG.  3.Has MS dementia. Continue home medication.  4.July's weakness. Etiology unclear. Continue IV hydration.  Advance goals of care discussion: partial son does not want the patient to be intubated for prolonged period but wants to be intubated for short-acting. No CPR.   Consults:  neurology  DVT Prophylaxis: subcutaneous Heparin Nutrition:  nothing by mouth until stroke evaluation  Family Communication:  son was present at bedside, opportunity was given to ask question and all questions were answered satisfactorily at the time of interview. Disposition: Admitted to inpatient in step-down unit.  Author: Lynden Oxford, MD [facility] Pager: [phone] 08/31/2014, 11:29 PM    If 7PM-7AM, please contact night-coverage [email or web address] Password [email or web address]

## 2014-08-31 NOTE — Consult Note (Signed)
Stroke Consult    Chief Complaint: altered mental status HPI: [name] is an 81 y.o. female with history of severe dementia brought in as code stroke for altered mental status. History provided via son. He reports patient was in normal state until 1600 when she had a BM and then generalized weakness with LOC. No abnormal movements associated with event. No loss of bowel or bladder function. Upon arousal noted to be confused and diaphoretic. Period of LOC was <5 seconds though still remains confused.   Son notes at baseline she is oriented to name only. He reports she had a similar episode around 2 years ago, had syncopal episode and then was confused afterwards. He was told it was vasovagal syncope. Discussed with the son that we cannot rule out a CVA at this time. Discussed the possibility of treating with tPA. Counseled him on the benefits/risks of tPA. He does not wish to pursue tPA. CT head imaging reviewed, shows generalized atrophy, chronic infarcts, no acute process.   Date last known well: 08/31/2014 Time last known well: 1600 tPA Given: no, son does not wish to pursue tPA  Past Medical History  Diagnosis Date  . Dementia   . Arthritis     History reviewed. No pertinent past surgical history.  History reviewed. No pertinent family history. Social History:  reports that she has never smoked. She does not have any smokeless tobacco history on file. Her alcohol and drug histories are not on file.  Allergies: No Known Allergies   (Not in a hospital admission)  ROS: Out of a complete 14 system review, the patient complains of only the following symptoms, and all other reviewed systems are negative. Unable to obtain due to mental status   Physical Examination: Filed Vitals:   08/31/14 1800  BP:   Pulse:   Temp: 94.9 F (34.9 C)  Resp:    Physical Exam  Constitutional: He appears well-developed and well-nourished.  Psych: Affect appropriate to situation Eyes:  No scleral injection HENT: No OP obstrucion Head: Normocephalic.  Cardiovascular: Normal rate and regular rhythm.  Respiratory: Effort normal and breath sounds normal.  GI: Soft. Bowel sounds are normal. No distension. There is no tenderness.  Skin: WDI   Neurologic Examination: Mental Status: Eyes closed, opens to voice and noxious stimuli. No verbal output, does yell out to noxious stimuli. Not following commands. Cranial Nerves: II: unable to visualize fundi, blinks to threat bilaterally, pupils equal, round, reactive to light III,IV, VI: ptosis not present, eyes midline, no gaze deviation V,VII: face symmetric VIII: unable to test IX,X: gag reflex present XI: unable to test XII: unable to test Motor: Minimal spontaneous movement of extremities, appears symmetrical Tone and bulk:normal tone throughout; no atrophy noted Sensory: briskly withdrawals all extremities to noxious stimuli Deep Tendon Reflexes: 2+ and symmetric throughout Plantars: Right: downgoing   Left: downgoing Cerebellar: Unable to test Gait: unable to test  Laboratory Studies:   Basic Metabolic Panel:  Recent Labs Lab 08/31/14 1758  NA 141  K 3.0*  CL 104  GLUCOSE 183*  BUN 13  CREATININE 0.80    Liver Function Tests: No results for input(s): AST, ALT, ALKPHOS, BILITOT, PROT, ALBUMIN in the last 168 hours. No results for input(s): LIPASE, AMYLASE in the last 168 hours. No results for input(s): AMMONIA in the last 168 hours.  CBC:  Recent Labs Lab 08/31/14 1758  HGB 16.0*  HCT 47.0*    Cardiac Enzymes: No results for input(s): CKTOTAL, CKMB, CKMBINDEX,  TROPONINI in the last 168 hours.  BNP: Invalid input(s): POCBNP  CBG:  Recent Labs Lab 08/31/14 1752  GLUCAP 171*    Microbiology: Results for orders placed or performed during the hospital encounter of 06/30/13  Urine culture     Status: None   Collection Time: 06/30/13  5:40 PM  Result Value Ref Range Status    Specimen Description URINE, CATHETERIZED  Final   Special Requests NONE  Final   Culture  Setup Time   Final    06/30/2013 18:49 Performed at [facility] Count   Final    >=100,000 COLONIES/ML Performed at [facility]   Culture   Final    ESCHERICHIA COLI Performed at [facility]   Report Status 07/02/2013 FINAL  Final   Organism ID, Bacteria ESCHERICHIA COLI  Final      Susceptibility   Escherichia coli - MIC*    AMPICILLIN <=2 SENSITIVE Sensitive     CEFAZOLIN <=4 SENSITIVE Sensitive     CEFTRIAXONE <=1 SENSITIVE Sensitive     CIPROFLOXACIN <=0.25 SENSITIVE Sensitive     GENTAMICIN <=1 SENSITIVE Sensitive     LEVOFLOXACIN <=0.12 SENSITIVE Sensitive     NITROFURANTOIN 32 SENSITIVE Sensitive     TOBRAMYCIN <=1 SENSITIVE Sensitive     TRIMETH/SULFA <=20 SENSITIVE Sensitive     PIP/TAZO <=4 SENSITIVE Sensitive     * ESCHERICHIA COLI    Coagulation Studies: No results for input(s): LABPROT, INR in the last 72 hours.  Urinalysis: No results for input(s): COLORURINE, LABSPEC, PHURINE, GLUCOSEU, HGBUR, BILIRUBINUR, KETONESUR, PROTEINUR, UROBILINOGEN, NITRITE, LEUKOCYTESUR in the last 168 hours.  Invalid input(s): APPERANCEUR  Lipid Panel:     Component Value Date/Time   CHOL  10/28/2010 0846    174        ATP III CLASSIFICATION:  <200     mg/dL   Desirable  098-119  mg/dL   Borderline High  >=147    mg/dL   High          TRIG 75 10/28/2010 0846   HDL 44 10/28/2010 0846   CHOLHDL 4.0 10/28/2010 0846   VLDL 15 10/28/2010 0846   LDLCALC * 10/28/2010 0846    115        Total Cholesterol/HDL:CHD Risk Coronary Heart Disease Risk Table                     Men   Women  1/2 Average Risk   3.4   3.3  Average Risk       5.0   4.4  2 X Average Risk   9.6   7.1  3 X Average Risk  23.4   11.0        Use the calculated Patient Ratio above and the CHD Risk Table to determine the patient's CHD Risk.        ATP III CLASSIFICATION (LDL):   <100     mg/dL   Optimal  829-562  mg/dL   Near or Above                    Optimal  130-159  mg/dL   Borderline  130-865  mg/dL   High  >784     mg/dL   Very High    ONGE9B:  Lab Results  Component Value Date   HGBA1C  10/27/2010    5.6 (NOTE)  According to the ADA Clinical Practice Recommendations for 2011, when HbA1c is used as a screening test:   >=6.5%   Diagnostic of Diabetes Mellitus           (if abnormal result  is confirmed)  5.7-6.4%   Increased risk of developing Diabetes Mellitus  References:Diagnosis and Classification of Diabetes Mellitus,Diabetes Care,2011,34(Suppl 1):S62-S69 and Standards of Medical Care in         Diabetes - 2011,Diabetes Care,2011,34  (Suppl 1):S11-S61.    Urine Drug Screen:  No results found for: LABOPIA, COCAINSCRNUR, LABBENZ, AMPHETMU, THCU, LABBARB  Alcohol Level: No results for input(s): ETH in the last 168 hours.  Other results:  Imaging: Ct Head Wo Contrast  08/31/2014   CLINICAL DATA:  Patient with sudden onset generalized weakness, unresponsive. Code stroke.  EXAM: CT HEAD WITHOUT CONTRAST  TECHNIQUE: Contiguous axial images were obtained from the base of the skull through the vertex without intravenous contrast.  COMPARISON:  06/29/2014, brain cc  FINDINGS: The ventricles and sulci are prominent, compatible with atrophy. Multiple bilateral chronic appearing lacunar infarcts are demonstrated within the bilateral basal ganglia. Old left thalamic infarct. Periventricular and subcortical white matter hypodensity compatible with chronic small vessel ischemic change. No definite evidence for acute cortically based infarct, intracranial hemorrhage, mass lesion or mass-effect. Bilateral cataract surgery. Paranasal sinuses unremarkable. Mastoid air cells are well aerated. Calvarium is intact.  IMPRESSION: No acute intracranial process.  Cortical atrophy, volume loss and chronic small  vessel ischemic change.  Bilateral chronic appearing lacunar infarcts.  Critical Value/emergent results were called by telephone at the time of interpretation on 08/31/2014 at 5:55 pm to Dr. Hosie Poisson, who verbally acknowledged these results.   Electronically Signed   By: Annia Belt M.D.   On: 08/31/2014 17:56    Assessment: 81 y.o. female with history of dementia and prior vasovagal syncope presenting for evaluation of generalized weakness with syncopal episode. Stroke code activated. tPA not considered as son does not wish for it. At baseline patient is oriented to name only. Currently moans to pain but otherwise non-verbal. Exam appears non-focal. Differential includes syncopal episode vs possible seizure/post-ictal state. Encephalopathy due to metabolic or infectious etiology also in the differential. Cannot rule out CVA but history and presentation atypical for this.    Plan: 1. MRI brain. If positive can complete rest of stroke workup 2. Unsure if any utility to checking 2D echo and carotid doppler as patient is unlikely to be a surgical candidate 3. Check CBC, CMP, UA, ammonia, TSH for metabolic or infectious etiology of AMS 4. Will consider EEG if above workup unremarkable.    [name] Cho, DO [facility] [phone]  If 7pm- 7am, please page neurology on call as listed in AMION. 08/31/2014, 6:10 PM

## 2014-08-31 NOTE — ED Notes (Signed)
Per family member, pt ok with short term intubation, no long term intubation, no CPR.

## 2014-08-31 NOTE — ED Notes (Signed)
Phlebotomy at bedside.

## 2014-08-31 NOTE — ED Notes (Signed)
Per ems pt from home with caregiver.  Pt was standing from commode when she c/o generalized weakness and have a syncopal episode.  Upon arousal, pt was altered and diaphoretic.  GCS 4 with EMS.  Pt afib on the monitor; no hx of afib.  CBG 270. VSS.

## 2014-08-31 NOTE — ED Notes (Signed)
Pt vomited numerous times en route with EMS.

## 2014-09-01 LAB — CBC WITH DIFFERENTIAL/PLATELET
BASOS PCT: 0 % (ref 0–1)
Basophils Absolute: 0 10*3/uL (ref 0.0–0.1)
EOS ABS: 0 10*3/uL (ref 0.0–0.7)
Eosinophils Relative: 1 % (ref 0–5)
HCT: 42 % (ref 36.0–46.0)
HEMOGLOBIN: 14.1 g/dL (ref 12.0–15.0)
LYMPHS ABS: 1.5 10*3/uL (ref 0.7–4.0)
LYMPHS PCT: 18 % (ref 12–46)
MCH: 29.8 pg (ref 26.0–34.0)
MCHC: 33.6 g/dL (ref 30.0–36.0)
MCV: 88.8 fL (ref 78.0–100.0)
MONO ABS: 0.8 10*3/uL (ref 0.1–1.0)
Monocytes Relative: 9 % (ref 3–12)
NEUTROS ABS: 6 10*3/uL (ref 1.7–7.7)
Neutrophils Relative %: 72 % (ref 43–77)
PLATELETS: 274 10*3/uL (ref 150–400)
RBC: 4.73 MIL/uL (ref 3.87–5.11)
RDW: 13.7 % (ref 11.5–15.5)
WBC: 8.4 10*3/uL (ref 4.0–10.5)

## 2014-09-01 LAB — COMPREHENSIVE METABOLIC PANEL
ALK PHOS: 78 U/L (ref 39–117)
ALT: 10 U/L (ref 0–35)
AST: 17 U/L (ref 0–37)
Albumin: 3.4 g/dL — ABNORMAL LOW (ref 3.5–5.2)
Anion gap: 9 (ref 5–15)
BUN: 10 mg/dL (ref 6–23)
CALCIUM: 8.9 mg/dL (ref 8.4–10.5)
CO2: 28 mmol/L (ref 19–32)
CREATININE: 0.86 mg/dL (ref 0.50–1.10)
Chloride: 104 mmol/L (ref 96–112)
GFR calc Af Amer: 66 mL/min — ABNORMAL LOW (ref >90)
GFR calc non Af Amer: 57 mL/min — ABNORMAL LOW (ref >90)
Glucose, Bld: 127 mg/dL — ABNORMAL HIGH (ref 70–99)
POTASSIUM: 3.9 mmol/L (ref 3.5–5.1)
Sodium: 141 mmol/L (ref 135–145)
Total Bilirubin: 0.4 mg/dL (ref 0.3–1.2)
Total Protein: 6.5 g/dL (ref 6.0–8.3)

## 2014-09-01 LAB — LIPID PANEL
CHOL/HDL RATIO: 4.1 ratio
CHOLESTEROL: 181 mg/dL (ref 0–200)
HDL: 44 mg/dL (ref >39)
LDL Cholesterol: 122 mg/dL — ABNORMAL HIGH (ref 0–99)
Triglycerides: 77 mg/dL (ref <150)
VLDL: 15 mg/dL (ref 0–40)

## 2014-09-01 LAB — TSH: TSH: 0.713 u[IU]/mL (ref 0.350–4.500)

## 2014-09-01 LAB — GLUCOSE, CAPILLARY
GLUCOSE-CAPILLARY: 101 mg/dL — AB (ref 70–99)
GLUCOSE-CAPILLARY: 157 mg/dL — AB (ref 70–99)

## 2014-09-01 MED ORDER — CEPHALEXIN 500 MG PO CAPS: 500.0000 mg | ORAL_CAPSULE | Freq: Two times a day (BID) | ORAL | Status: DC

## 2014-09-01 NOTE — Progress Notes (Signed)
PT Cancellation Note/ Discharge  Patient Details Name: [name] MRN: [id number] DOB: 10/21/1920   Cancelled Treatment:    Reason Eval/Treat Not Completed: Other (comment) (pt already scheduled to D/C son present and denies need for therapy services). Will sign off.   Toney Sangabor, Wetona Viramontes Beth 09/01/2014, 10:27 AM  Delaney MeigsMaija Tabor Ulla Mckiernan, PT [phone]

## 2014-09-01 NOTE — Progress Notes (Signed)
09/01/2014 foley cath was removed at 0815 she had 300 cc of clear yellow urine and tolerated removal of cath well. Lovie MacadamiaNadine Tanaisha Pittman RN

## 2014-09-01 NOTE — Progress Notes (Signed)
SLP Cancellation Note  Patient Details Name: [name] MRN: [id number] DOB: [birth year]   Cancelled treatment:       Reason Eval/Treat Not Completed: Other (comment). MD planning to d/c pt, back to baseline. No SLP assessment needed at this time per MD.    Dyanne IhaeBlois, Riley NearingBonnie Caroline 09/01/2014, 8:03 AM

## 2014-09-01 NOTE — Progress Notes (Signed)
Subjective: No overnight events. Brain MRI completed shows no acute stroke. Son in room with patient, he feels she is back to her baseline.  Objective: Current vital signs: BP 98/55 mmHg  Pulse 79  Temp(Src) 98.3 F (36.8 C) (Axillary)  Resp 20  SpO2 98% Vital signs in last 24 hours: Temp:  [94.9 F (34.9 C)-98.3 F (36.8 C)] 98.3 F (36.8 C) (02/13 0400) Pulse Rate:  [61-98] 79 (02/13 0600) Resp:  [10-24] 20 (02/13 0600) BP: (90-169)/(49-105) 98/55 mmHg (02/13 0600) SpO2:  [95 %-100 %] 98 % (02/13 0600)  Intake/Output from previous day: 02/12 0701 - 02/13 0700 In: 775 [I.V.:675; IV Piggyback:100] Out: -  Intake/Output this shift:   Nutritional status: Diet NPO time specified  Neurologic Exam: Mental Status: Eyes opened, oriented to name only. Yells out in response to pain  Cranial Nerves: II:  blinks to threat bilaterally, pupils equal, round, reactive to light III,IV, VI: ptosis not present, eyes midline, no gaze deviation V,VII: face symmetric Motor: Minimal spontaneous movement of extremities, appears symmetrical Tone and bulk:normal tone throughout; no atrophy noted Sensory: briskly withdrawals all extremities to noxious stimuli  Lab Results: Basic Metabolic Panel:  Recent Labs Lab 08/31/14 1751 08/31/14 1758 09/01/14 0327  NA 139 141 141  K 3.2* 3.0* 3.9  CL 105 104 104  CO2 23  --  28  GLUCOSE 182* 183* 127*  BUN CREATININE 0.87 0.80 0.86  CALCIUM 9.3  --  8.9    Liver Function Tests:  Recent Labs Lab 08/31/14 1751 09/01/14 0327  AST 23 17  ALT 12 10  ALKPHOS 82 78  BILITOT 0.8 0.4  PROT 7.4 6.5  ALBUMIN 3.8 3.4*   No results for input(s): LIPASE, AMYLASE in the last 168 hours. No results for input(s): AMMONIA in the last 168 hours.  CBC:  Recent Labs Lab 08/31/14 1751 08/31/14 1758 09/01/14 0327  WBC 12.0*  --  8.4  NEUTROABS 7.1  --  6.0  HGB 15.1* 16.0* 14.1  HCT 43.3 47.0* 42.0  MCV 86.8  --  88.8  PLT 320  --   274    Cardiac Enzymes: No results for input(s): CKTOTAL, CKMB, CKMBINDEX, TROPONINI in the last 168 hours.  Lipid Panel:  Recent Labs Lab 09/01/14 0327  CHOL 181  TRIG 77  HDL 44  CHOLHDL 4.1  VLDL 15  LDLCALC 161*    CBG:  Recent Labs Lab 08/31/14 1752 09/01/14 0002  GLUCAP 171* 157*    Microbiology: Results for orders placed or performed during the hospital encounter of 08/31/14  MRSA PCR Screening     Status: None   Collection Time: 08/31/14  9:38 PM  Result Value Ref Range Status   MRSA by PCR NEGATIVE NEGATIVE Final    Comment:        The GeneXpert MRSA Assay (FDA approved for NASAL specimens only), is one component of a comprehensive MRSA colonization surveillance program. It is not intended to diagnose MRSA infection nor to guide or monitor treatment for MRSA infections.     Coagulation Studies:  Recent Labs  08/31/14 1751  LABPROT 16.3*  INR 1.30    Imaging: Ct Head Wo Contrast  08/31/2014   CLINICAL DATA:  Patient with sudden onset generalized weakness, unresponsive. Code stroke.  EXAM: CT HEAD WITHOUT CONTRAST  TECHNIQUE: Contiguous axial images were obtained from the base of the skull through the vertex without intravenous contrast.  COMPARISON:  06/29/2014, brain cc  FINDINGS: The  ventricles and sulci are prominent, compatible with atrophy. Multiple bilateral chronic appearing lacunar infarcts are demonstrated within the bilateral basal ganglia. Old left thalamic infarct. Periventricular and subcortical white matter hypodensity compatible with chronic small vessel ischemic change. No definite evidence for acute cortically based infarct, intracranial hemorrhage, mass lesion or mass-effect. Bilateral cataract surgery. Paranasal sinuses unremarkable. Mastoid air cells are well aerated. Calvarium is intact.  IMPRESSION: No acute intracranial process.  Cortical atrophy, volume loss and chronic small vessel ischemic change.  Bilateral chronic  appearing lacunar infarcts.  Critical Value/emergent results were called by telephone at the time of interpretation on 08/31/2014 at 5:55 pm to Dr. Hosie Poisson, who verbally acknowledged these results.   Electronically Signed   By: Annia Belt M.D.   On: 08/31/2014 17:56   Mr Brain Wo Contrast  08/31/2014   CLINICAL DATA:  Generalized weakness and altered mental status after bowel movement this afternoon. Baseline severe dementia.  EXAM: MRI HEAD WITHOUT CONTRAST  TECHNIQUE: Multiplanar, multiecho pulse sequences of the brain and surrounding structures were obtained without intravenous contrast.  COMPARISON:  CT of the head August 31, 2014 at 1747 hours  FINDINGS: No reduced diffusion to suggest acute ischemia. No susceptibility artifact to suggest hemorrhage.  Moderate ventriculomegaly, likely on the basis of global parenchymal brain volume loss as there is overall commensurate enlargement of cerebral sulci and cerebellar folia. Patchy to confluent supratentorial white matter FLAIR T2 hyperintensities. No midline shift, mass effect or mass lesions. Curvilinear T1 shortening within mesial LEFT occipital lobe, with minimal apparent volume loss. Remote bilateral tiny basal ganglia and RIGHT thalamus lacunar infarcts.  No abnormal extra-axial fluid collections. Observed intracranial vascular flow voids at the skullbase with dolichoectasia.  Status post bilateral ocular lens implants. Patient is edentulous. Trace paranasal sinus mucosal thickening, trace LEFT mastoid effusion. No abnormal sellar expansion. No cerebellar tonsillar ectopia. No suspicious calvarial bone marrow signal. Severe degenerative change of the midcervical spine with grade 1 C3-4 anterolisthesis partially imaged from at least mild canal stenosis.  IMPRESSION: No acute intracranial process, specifically no evidence of acute ischemia.  Involutional changes. Moderate to severe white matter changes can be seen with chronic small vessel ischemic disease.   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 Bilateral basal ganglia and RIGHT thalamus remote lacunar infarcts and/or perivascular spaces.   Electronically Signed   By: Awilda Metro   On: 08/31/2014 21:59   Dg Chest Portable 1 View  08/31/2014   CLINICAL DATA:  Syncopal episode. Altered mental status. Diaphoresis.  EXAM: PORTABLE CHEST - 1 VIEW  COMPARISON:  06/30/2013  FINDINGS: Low lung volumes noted, however both lungs remain clear. No evidence of pneumothorax or pleural effusion. Heart size is within normal limits.  IMPRESSION: Low lung volumes.  No active disease.   Electronically Signed   By: Myles Rosenthal M.D.   On: 08/31/2014 18:56    Medications:  Scheduled: .  stroke: mapping our early stages of recovery book   Does not apply Once  . aspirin  300 mg Rectal Daily   Or  . aspirin  325 mg Oral Daily  . cefTRIAXone (ROCEPHIN)  IV  1 g Intravenous Q24H  . donepezil  10 mg Oral Daily  . enoxaparin (LOVENOX) injection  40 mg Subcutaneous Q24H  . memantine  5 mg Oral Daily    Assessment/Plan:  81y/o woman with history of dementia presenting for episode of LOC with post event confusion. Per description the event sounds consistent with a vasovagal syncope. Cannot  rule out seizure with post-ictal event but episode atypical for seizure. MRI brain unremarkable. Son feels patient is back to baseline. No further in-patient neurological workup indicated. Can complete EEG as outpatient.    LOS: 1 day   Elspeth Choeter Jawaan Adachi, DO [facility] [phone]  If 7pm- 7am, please page neurology on call as listed in AMION. 09/01/2014  7:30 AM

## 2014-09-01 NOTE — Discharge Summary (Signed)
PATIENT DETAILS Name: [name] Age: 81 y.o. Sex: female Date of Birth: [birth year] MRN: [id number]. Admitting Physician: Lynden OxfordPranav Patel, MD PCP:No primary care provider on file.  Admit Date: 08/31/2014 Discharge date: 09/01/2014  Recommendations for Outpatient Follow-up:  1. Please follow blood and urine cultures that are pending at the time of discharge. 2. New medication-Keflex for 5 days for presumed UTI. 3. Consider outpatient EEG  PRIMARY DISCHARGE DIAGNOSIS:  Principal Problem:   Acute encephalopathy Active Problems:   Syncope   Alzheimer's dementia   UTI (lower urinary tract infection)   Weakness generalized      PAST MEDICAL HISTORY: Past Medical History  Diagnosis Date  . Dementia   . Arthritis     DISCHARGE MEDICATIONS: Current Discharge Medication List    START taking these medications   Details  cephALEXin (KEFLEX) 500 MG capsule Take 1 capsule (500 mg total) by mouth 2 (two) times daily. Qty: 10 capsule, Refills: 0      CONTINUE these medications which have NOT CHANGED   Details  Cholecalciferol (VITAMIN D3) 2000 UNITS TABS Take 1 tablet by mouth daily.    donepezil (ARICEPT) 10 MG tablet Take 10 mg by mouth daily.     memantine (NAMENDA) 5 MG tablet Take 5 mg by mouth daily.          ALLERGIES:  No Known Allergies  BRIEF HPI:  See H&P, Labs, Consult and Test reports for all details in brief, patient is a 81 year old female who was brought to the hospital following a syncopal episode following which she had transient altered mental status and hypothermia. She was admitted for further evaluation and treatment.  CONSULTATIONS:   neurology  PERTINENT RADIOLOGIC STUDIES: Ct Head Wo Contrast  08/31/2014   CLINICAL DATA:  Patient with sudden onset generalized weakness, unresponsive. Code stroke.  EXAM: CT HEAD WITHOUT CONTRAST  TECHNIQUE: Contiguous axial images were obtained from the base of the skull through the vertex without  intravenous contrast.  COMPARISON:  06/29/2014, brain cc  FINDINGS: The ventricles and sulci are prominent, compatible with atrophy. Multiple bilateral chronic appearing lacunar infarcts are demonstrated within the bilateral basal ganglia. Old left thalamic infarct. Periventricular and subcortical white matter hypodensity compatible with chronic small vessel ischemic change. No definite evidence for acute cortically based infarct, intracranial hemorrhage, mass lesion or mass-effect. Bilateral cataract surgery. Paranasal sinuses unremarkable. Mastoid air cells are well aerated. Calvarium is intact.  IMPRESSION: No acute intracranial process.  Cortical atrophy, volume loss and chronic small vessel ischemic change.  Bilateral chronic appearing lacunar infarcts.  Critical Value/emergent results were called by telephone at the time of interpretation on 08/31/2014 at 5:55 pm to Dr. Hosie PoissonSumner, who verbally acknowledged these results.   Electronically Signed   By: Annia Beltrew  Davis M.D.   On: 08/31/2014 17:56   Mr Brain Wo Contrast  08/31/2014   CLINICAL DATA:  Generalized weakness and altered mental status after bowel movement this afternoon. Baseline severe dementia.  EXAM: MRI HEAD WITHOUT CONTRAST  TECHNIQUE: Multiplanar, multiecho pulse sequences of the brain and surrounding structures were obtained without intravenous contrast.  COMPARISON:  CT of the head August 31, 2014 at 1747 hours  FINDINGS: No reduced diffusion to suggest acute ischemia. No susceptibility artifact to suggest hemorrhage.  Moderate ventriculomegaly, likely on the basis of global parenchymal brain volume loss as there is overall commensurate enlargement of cerebral sulci and cerebellar folia. Patchy to confluent supratentorial white matter FLAIR T2 hyperintensities. No midline shift, mass effect or mass  lesions. Curvilinear T1 shortening within mesial LEFT occipital lobe, with minimal apparent volume loss. Remote bilateral tiny basal ganglia and RIGHT  thalamus lacunar infarcts.  No abnormal extra-axial fluid collections. Observed intracranial vascular flow voids at the skullbase with dolichoectasia.  Status post bilateral ocular lens implants. Patient is edentulous. Trace paranasal sinus mucosal thickening, trace LEFT mastoid effusion. No abnormal sellar expansion. No cerebellar tonsillar ectopia. No suspicious calvarial bone marrow signal. Severe degenerative change of the midcervical spine with grade 1 C3-4 anterolisthesis partially imaged from at least mild canal stenosis.  IMPRESSION: No acute intracranial process, specifically no evidence of acute ischemia.  Involutional changes. Moderate to severe white matter changes can be seen with chronic small vessel ischemic disease.  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 Bilateral basal ganglia and RIGHT thalamus remote lacunar infarcts and/or perivascular spaces.   Electronically Signed   By: Awilda Metro   On: 08/31/2014 21:59   Dg Chest Portable 1 View  08/31/2014   CLINICAL DATA:  Syncopal episode. Altered mental status. Diaphoresis.  EXAM: PORTABLE CHEST - 1 VIEW  COMPARISON:  06/30/2013  FINDINGS: Low lung volumes noted, however both lungs remain clear. No evidence of pneumothorax or pleural effusion. Heart size is within normal limits.  IMPRESSION: Low lung volumes.  No active disease.   Electronically Signed   By: Myles Rosenthal M.D.   On: 08/31/2014 18:56     PERTINENT LAB RESULTS: CBC:  Recent Labs  08/31/14 1751 08/31/14 1758 09/01/14 0327  WBC 12.0*  --  8.4  HGB 15.1* 16.0* 14.1  HCT 43.3 47.0* 42.0  PLT 320  --  274   CMET CMP     Component Value Date/Time   NA 141 09/01/2014 0327   K 3.9 09/01/2014 0327   CL 104 09/01/2014 0327   CO2 28 09/01/2014 0327   GLUCOSE 127* 09/01/2014 0327   BUN 10 09/01/2014 0327   CREATININE 0.86 09/01/2014 0327   CALCIUM 8.9 09/01/2014 0327   PROT 6.5 09/01/2014 0327   ALBUMIN 3.4* 09/01/2014 0327   AST 17 09/01/2014 0327    ALT 10 09/01/2014 0327   ALKPHOS 78 09/01/2014 0327   BILITOT 0.4 09/01/2014 0327   GFRNONAA 57* 09/01/2014 0327   GFRAA 66* 09/01/2014 0327    GFR CrCl cannot be calculated (Unknown ideal weight.). No results for input(s): LIPASE, AMYLASE in the last 72 hours. No results for input(s): CKTOTAL, CKMB, CKMBINDEX, TROPONINI in the last 72 hours. Invalid input(s): POCBNP No results for input(s): DDIMER in the last 72 hours. No results for input(s): HGBA1C in the last 72 hours.  Recent Labs  09/01/14 0327  CHOL 181  HDL 44  LDLCALC 122*  TRIG 77  CHOLHDL 4.1    Recent Labs  08/31/14 2300  TSH 0.713   No results for input(s): VITAMINB12, FOLATE, FERRITIN, TIBC, IRON, RETICCTPCT in the last 72 hours. Coags:  Recent Labs  08/31/14 1751  INR 1.30   Microbiology: Recent Results (from the past 240 hour(s))  MRSA PCR Screening     Status: None   Collection Time: 08/31/14  9:38 PM  Result Value Ref Range Status   MRSA by PCR NEGATIVE NEGATIVE Final    Comment:        The GeneXpert MRSA Assay (FDA approved for NASAL specimens only), is one component of a comprehensive MRSA colonization surveillance program. It is not intended to diagnose MRSA infection nor to guide or monitor treatment for MRSA infections.  BRIEF HOSPITAL COURSE:   Principal Problem:   Acute encephalopathy: Suspect transient confusion was secondary to a post vasovagal syncope. Patient has dementia at baseline, and apparently developed worsening confusion. She was admitted and neurology was consulted. CT head, MRI brain were negative for acute abnormalities. UA was consistent with a UTI, however given dementia and not sure whether this is a colonization or true infection. In any event she was started on Rocephin. This morning, she is back to her usual baseline. Patient's son is at bedside, and is anxious to take her back home. This patient has advanced dementia and is dependent on her caregivers for  care 24/7. She would best benefit from being back to her familiar surroundings then being in the hospital. Patient's son is agreeable to discharge as well, I have asked him to make an appointment with her primary care practitioner so that pending urine and blood cultures can be followed. She will be empirically placed on Keflex for a few more days.  Active Problems:   Syncope: Likely vasovagal syncope. Unlikely to be a seizure disorder at this time. MRI brain negative, no further recommendations from neurology. Defer to PCP whether or not to pursue outpatient EEG.    ?UTI (lower urinary tract infection): UA was consistent with a UTI him a however given dementia not sure whether this is a colonization or a real infection. She was transiently hypothermic as well. In any event she was admitted and started on IV Rocephin. Since his likely back to her baseline, I suspect that this is probably a contamination rather than a true infection. In any event I will place on Keflex for a few more days. Patient's son will have her primary care practitioner follow urine and blood cultures.    Dementia: Resume medications. Current mental status is back to baseline.  TODAY-DAY OF DISCHARGE:  Subjective:   Shewanda Sharpe today is back to her usual baseline. Patient's son is at bedside.  Objective:   Blood pressure 112/62, pulse 79, temperature 98.4 F (36.9 C), temperature source Axillary, resp. rate 16, SpO2 99 %.  Intake/Output Summary (Last 24 hours) at 09/01/14 0757 Last data filed at 09/01/14 0600  Gross per 24 hour  Intake    775 ml  Output      0 ml  Net    775 ml   There were no vitals filed for this visit.  Exam Awake and pleasantly confused. Agitated at times. [place].AT,PERRAL Supple Neck,No JVD, No cervical lymphadenopathy appriciated.  Symmetrical Chest wall movement, Good air movement bilaterally, CTAB RRR,No Gallops,Rubs or new Murmurs, No Parasternal Heave +ve B.Sounds, Abd Soft, Non  tender, No organomegaly appriciated, No rebound -guarding or rigidity. No Cyanosis, Clubbing or edema, No new Rash or bruise  DISCHARGE CONDITION: Stable  DISPOSITION: Home with family-family has 24/7 care.  DISCHARGE INSTRUCTIONS:    Activity:  As tolerated with Full fall precautions use walker/cane & assistance as needed  Diet recommendation: Regular Diet  Discharge Instructions    Call MD for:  persistant nausea and vomiting    Complete by:  As directed      Call MD for:  temperature >100.4    Complete by:  As directed      Diet - low sodium heart healthy    Complete by:  As directed      Increase activity slowly    Complete by:  As directed            Follow-up Information  Schedule an appointment as soon as possible for a visit in 2 days to follow up.   Why:  please ask your primary MD to follow blood and urine cultures   Contact information:   Primary care practitione      Total Time spent on discharge equals 45 minutes.  SignedJeoffrey Massed 09/01/2014 7:57 AM

## 2014-09-03 LAB — URINE CULTURE: Colony Count: >=100000

## 2014-09-03 LAB — HEMOGLOBIN A1C
HEMOGLOBIN A1C: 5.5 % (ref 4.8–5.6)
Mean Plasma Glucose: 111 mg/dL

## 2014-09-07 LAB — CULTURE, BLOOD (ROUTINE X 2)
CULTURE: NO GROWTH
CULTURE: NO GROWTH

## 2014-11-18 ENCOUNTER — Encounter (HOSPITAL_COMMUNITY): Payer: Self-pay | Admitting: Emergency Medicine

## 2014-11-18 ENCOUNTER — Emergency Department (HOSPITAL_COMMUNITY)
Admission: EM | Admit: 2014-11-18 | Discharge: 2014-11-18 | Disposition: A | Discharge status: Home / Self Care | Payer: Medicare Other | Attending: Emergency Medicine | Admitting: Emergency Medicine

## 2014-11-18 DIAGNOSIS — F0390 Unspecified dementia without behavioral disturbance: Secondary | ICD-10-CM | POA: Insufficient documentation

## 2014-11-18 DIAGNOSIS — Z8739 Personal history of other diseases of the musculoskeletal system and connective tissue: Secondary | ICD-10-CM | POA: Diagnosis not present

## 2014-11-18 DIAGNOSIS — Z9183 Wandering in diseases classified elsewhere: Secondary | ICD-10-CM | POA: Diagnosis present

## 2014-11-18 DIAGNOSIS — Z79899 Other long term (current) drug therapy: Secondary | ICD-10-CM | POA: Insufficient documentation

## 2014-11-18 NOTE — Discharge Instructions (Signed)
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
## 2014-11-18 NOTE — ED Notes (Signed)
Pt uses walker to get around at home. Per pt's son, pt is at her baseline. Last time he states that he saw pt was around 7am this morning he got her back in bed and tucked her in and she went back to sleep.

## 2014-11-18 NOTE — ED Provider Notes (Signed)
CSN: [id number]     Arrival date & time 11/18/14  1511 History   First MD Initiated Contact with Patient 11/18/14 1516     Chief Complaint  Patient presents with  . found outside     HPI Pt lives with her son.   He went to church this morning.  Neighbors saw the patient sitting outside on the front porch in the rain.  They called 911.   At baseline, pt is confused.  She can get around with a walker.  Family makes her food for her.  They also have home care.  Patient is here at the bedside. He states she seems to be her usual self. She does have dementia and has wandered outside in the past. The patient has not been having any trouble with complaints of any pain. She has not been having any issues with her breathing. No fevers or chills.  Patient denies any symptoms here in the emergency room.   Past Medical History  Diagnosis Date  . Dementia   . Arthritis    History reviewed. No pertinent past surgical history. No family history on file. History  Substance Use Topics  . Smoking status: Never Smoker   . Smokeless tobacco: Not on file  . Alcohol Use: No   OB History    No data available     Review of Systems  All other systems reviewed and are negative.     Allergies  Review of patient's allergies indicates no known allergies.  Home Medications   Prior to Admission medications   Medication Sig Start Date End Date Taking? Authorizing Provider  Cholecalciferol (VITAMIN D3) 2000 UNITS TABS Take 1 tablet by mouth daily.   Yes Historical Provider, MD  donepezil (ARICEPT) 10 MG tablet Take 10 mg by mouth daily.    Yes Historical Provider, MD  memantine (NAMENDA) 5 MG tablet Take 5 mg by mouth daily.     Yes Historical Provider, MD  cephALEXin (KEFLEX) 500 MG capsule Take 1 capsule (500 mg total) by mouth 2 (two) times daily. Patient not taking: Reported on 11/18/2014 09/01/14   Maretta BeesShanker M Ghimire, MD   BP 151/63 mmHg  Pulse 88  Temp(Src) 95.7 F (35.4 C) (Rectal)  Resp 24   SpO2 100% Physical Exam  Constitutional: No distress.  Frail, elderly  HENT:  Head: Normocephalic and atraumatic.  Right Ear: External ear normal.  Left Ear: External ear normal.  Eyes: Conjunctivae are normal. Right eye exhibits no discharge. Left eye exhibits no discharge. No scleral icterus.  Neck: Neck supple. No tracheal deviation present.  Cardiovascular: Normal rate, regular rhythm and intact distal pulses.   Pulmonary/Chest: Effort normal and breath sounds normal. No stridor. No respiratory distress. She has no wheezes. She has no rales.  Abdominal: Soft. Bowel sounds are normal. She exhibits no distension. There is no tenderness. There is no rebound and no guarding.  Musculoskeletal: She exhibits no edema or tenderness.  Neurological: She is alert. She has normal strength. She is disoriented. No cranial nerve deficit (no facial droop, extraocular movements intact, no slurred speech) or sensory deficit. She exhibits normal muscle tone. She displays no seizure activity. Coordination normal.  The patient is alert and awake, she follows commands, she is confused  Skin: Skin is warm and dry. No rash noted.  Psychiatric: She has a normal mood and affect.  Nursing note and vitals reviewed.   ED Course  Procedures (including critical care time)   MDM   Final  diagnoses:  Dementia, without behavioral disturbance    Patient denies any complaints. Her son states that the patient is acting normally. She does have dementia and in the past has wandered outside of the house. The patient's initial vital signs showed that her temperature was low. However, I do not feel that she has significant hypothermia. The ambient temperature is in the 70s  Patient ate a meal here. She was able to get up and walk around the emergency department without difficulty.  The patient's son and I discussed not doing any further workup and he is comfortable with this plan.    Linwood Dibbles, MD 11/18/14 [phone]

## 2014-11-18 NOTE — ED Notes (Signed)
Pt able to ambulate with moderate assist. Walks at home with walker. Pt has some unsteady gait but according to family that is normal and baseline for the pt. Pt given meal tray and is eating at this time

## 2014-11-18 NOTE — ED Notes (Signed)
Bed: WA14 Expected date:  Expected time:  Means of arrival:  Comments: EMS 

## 2014-12-06 ENCOUNTER — Emergency Department (HOSPITAL_COMMUNITY)
Admission: EM | Admit: 2014-12-06 | Discharge: 2014-12-06 | Disposition: A | Discharge status: Home / Self Care | Payer: Medicare Other | Attending: Emergency Medicine | Admitting: Emergency Medicine

## 2014-12-06 ENCOUNTER — Emergency Department (HOSPITAL_COMMUNITY): Payer: Medicare Other

## 2014-12-06 ENCOUNTER — Encounter (HOSPITAL_COMMUNITY): Payer: Self-pay | Admitting: *Deleted

## 2014-12-06 DIAGNOSIS — S8992XA Unspecified injury of left lower leg, initial encounter: Secondary | ICD-10-CM | POA: Insufficient documentation

## 2014-12-06 DIAGNOSIS — Y92410 Unspecified street and highway as the place of occurrence of the external cause: Secondary | ICD-10-CM | POA: Diagnosis not present

## 2014-12-06 DIAGNOSIS — F0390 Unspecified dementia without behavioral disturbance: Secondary | ICD-10-CM | POA: Insufficient documentation

## 2014-12-06 DIAGNOSIS — Y999 Unspecified external cause status: Secondary | ICD-10-CM | POA: Diagnosis not present

## 2014-12-06 DIAGNOSIS — Z8739 Personal history of other diseases of the musculoskeletal system and connective tissue: Secondary | ICD-10-CM | POA: Insufficient documentation

## 2014-12-06 DIAGNOSIS — Y939 Activity, unspecified: Secondary | ICD-10-CM | POA: Diagnosis not present

## 2014-12-06 DIAGNOSIS — Z79899 Other long term (current) drug therapy: Secondary | ICD-10-CM | POA: Diagnosis not present
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
## 2014-12-06 NOTE — Discharge Instructions (Signed)
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
## 2015-05-23 ENCOUNTER — Emergency Department (HOSPITAL_COMMUNITY): Payer: Medicare Other

## 2015-05-23 ENCOUNTER — Inpatient Hospital Stay (HOSPITAL_COMMUNITY)
Admission: EM | Admit: 2015-05-23 | Discharge: 2015-05-24 | DRG: 690 | Disposition: A | Discharge status: Home / Self Care | Payer: Medicare Other | Source: Intra-hospital | Attending: Internal Medicine | Admitting: Internal Medicine

## 2015-05-23 ENCOUNTER — Encounter (HOSPITAL_COMMUNITY): Payer: Self-pay | Admitting: Neurology

## 2015-05-23 DIAGNOSIS — R569 Unspecified convulsions: Secondary | ICD-10-CM | POA: Diagnosis present

## 2015-05-23 DIAGNOSIS — N390 Urinary tract infection, site not specified: Secondary | ICD-10-CM | POA: Diagnosis not present

## 2015-05-23 DIAGNOSIS — R55 Syncope and collapse: Secondary | ICD-10-CM | POA: Diagnosis present

## 2015-05-23 DIAGNOSIS — F0390 Unspecified dementia without behavioral disturbance: Secondary | ICD-10-CM | POA: Diagnosis present

## 2015-05-23 DIAGNOSIS — Z8249 Family history of ischemic heart disease and other diseases of the circulatory system: Secondary | ICD-10-CM | POA: Diagnosis not present

## 2015-05-23 DIAGNOSIS — N3000 Acute cystitis without hematuria: Secondary | ICD-10-CM | POA: Diagnosis not present

## 2015-05-23 DIAGNOSIS — R739 Hyperglycemia, unspecified: Secondary | ICD-10-CM | POA: Diagnosis present

## 2015-05-23 DIAGNOSIS — R4182 Altered mental status, unspecified: Secondary | ICD-10-CM | POA: Diagnosis not present

## 2015-05-23 LAB — I-STAT CHEM 8, ED
BUN: 16 mg/dL (ref 6–20)
CALCIUM ION: 1.1 mmol/L — AB (ref 1.13–1.30)
Chloride: 103 mmol/L (ref 101–111)
Creatinine, Ser: 0.8 mg/dL (ref 0.44–1.00)
Glucose, Bld: 141 mg/dL — ABNORMAL HIGH (ref 65–99)
HCT: 45 % (ref 36.0–46.0)
Hemoglobin: 15.3 g/dL — ABNORMAL HIGH (ref 12.0–15.0)
Potassium: 3.1 mmol/L — ABNORMAL LOW (ref 3.5–5.1)
SODIUM: 143 mmol/L (ref 135–145)
TCO2: 22 mmol/L (ref 0–100)

## 2015-05-23 LAB — URINE MICROSCOPIC-ADD ON

## 2015-05-23 LAB — COMPREHENSIVE METABOLIC PANEL
ALK PHOS: 62 U/L (ref 38–126)
ALT: 17 U/L (ref 14–54)
AST: 28 U/L (ref 15–41)
Albumin: 3.7 g/dL (ref 3.5–5.0)
Anion gap: 14 (ref 5–15)
BUN: 14 mg/dL (ref 6–20)
CO2: 21 mmol/L — AB (ref 22–32)
CREATININE: 0.82 mg/dL (ref 0.44–1.00)
Calcium: 9.3 mg/dL (ref 8.9–10.3)
Chloride: 105 mmol/L (ref 101–111)
GFR calc Af Amer: >60 mL/min (ref >60)
Glucose, Bld: 146 mg/dL — ABNORMAL HIGH (ref 65–99)
Potassium: 3.2 mmol/L — ABNORMAL LOW (ref 3.5–5.1)
SODIUM: 140 mmol/L (ref 135–145)
Total Bilirubin: 0.9 mg/dL (ref 0.3–1.2)
Total Protein: 6.9 g/dL (ref 6.5–8.1)

## 2015-05-23 LAB — DIFFERENTIAL
Basophils Absolute: 0.1 10*3/uL (ref 0.0–0.1)
Basophils Relative: 1 %
Eosinophils Absolute: 0 10*3/uL (ref 0.0–0.7)
Eosinophils Relative: 0 %
LYMPHS PCT: 29 %
Lymphs Abs: 2.9 10*3/uL (ref 0.7–4.0)
MONO ABS: 0.7 10*3/uL (ref 0.1–1.0)
MONOS PCT: 7 %
NEUTROS ABS: 6.2 10*3/uL (ref 1.7–7.7)
Neutrophils Relative %: 63 %

## 2015-05-23 LAB — URINALYSIS, ROUTINE W REFLEX MICROSCOPIC
BILIRUBIN URINE: NEGATIVE
GLUCOSE, UA: NEGATIVE mg/dL
HGB URINE DIPSTICK: NEGATIVE
KETONES UR: NEGATIVE mg/dL
Nitrite: POSITIVE — AB
PROTEIN: NEGATIVE mg/dL
Specific Gravity, Urine: 1.013 (ref 1.005–1.030)
Urobilinogen, UA: 1 mg/dL (ref 0.0–1.0)
pH: 7 (ref 5.0–8.0)

## 2015-05-23 LAB — TROPONIN I

## 2015-05-23 LAB — CBC
HCT: 41.8 % (ref 36.0–46.0)
Hemoglobin: 14.5 g/dL (ref 12.0–15.0)
MCH: 30.6 pg (ref 26.0–34.0)
MCHC: 34.7 g/dL (ref 30.0–36.0)
MCV: 88.2 fL (ref 78.0–100.0)
Platelets: 263 10*3/uL (ref 150–400)
RBC: 4.74 MIL/uL (ref 3.87–5.11)
RDW: 13.3 % (ref 11.5–15.5)
WBC: 9.9 10*3/uL (ref 4.0–10.5)

## 2015-05-23 LAB — APTT: aPTT: 31 seconds (ref 24–37)

## 2015-05-23 LAB — PROTIME-INR
INR: 1.31 (ref 0.00–1.49)
Prothrombin Time: 16.4 seconds — ABNORMAL HIGH (ref 11.6–15.2)

## 2015-05-23 LAB — RAPID URINE DRUG SCREEN, HOSP PERFORMED
AMPHETAMINES: NOT DETECTED
BARBITURATES: NOT DETECTED
Benzodiazepines: NOT DETECTED
Cocaine: NOT DETECTED
Opiates: NOT DETECTED
Tetrahydrocannabinol: NOT DETECTED

## 2015-05-23 LAB — ETHANOL: Alcohol, Ethyl (B): <5 mg/dL (ref <5)

## 2015-05-23 LAB — I-STAT TROPONIN, ED: Troponin i, poc: 0.01 ng/mL (ref 0.00–0.08)

## 2015-05-23 MED ORDER — ROCURONIUM BROMIDE 50 MG/5ML IV SOLN
INTRAVENOUS | Status: AC
  Filled 2015-05-23: qty 2

## 2015-05-23 MED ORDER — LIDOCAINE HCL (CARDIAC) 20 MG/ML IV SOLN
INTRAVENOUS | Status: AC
  Filled 2015-05-23: qty 5

## 2015-05-23 MED ORDER — STROKE: EARLY STAGES OF RECOVERY BOOK
Freq: Once | Status: AC
  Administered 2015-05-24: 02:00:00
  Filled 2015-05-23: qty 1

## 2015-05-23 MED ORDER — ASPIRIN 325 MG PO TABS: 325.0000 mg | ORAL_TABLET | Freq: Every day | ORAL | Status: DC

## 2015-05-23 MED ORDER — ONDANSETRON HCL 4 MG/2ML IJ SOLN
4.0000 mg | Freq: Once | INTRAMUSCULAR | Status: AC
  Administered 2015-05-23: 4 mg via INTRAVENOUS

## 2015-05-23 MED ORDER — SODIUM CHLORIDE 0.9 % IV SOLN
INTRAVENOUS | Status: DC
  Administered 2015-05-23: 23:00:00 via INTRAVENOUS

## 2015-05-23 MED ORDER — SENNOSIDES-DOCUSATE SODIUM 8.6-50 MG PO TABS: 1.0000 | ORAL_TABLET | Freq: Every evening | ORAL | Status: DC | PRN

## 2015-05-23 MED ORDER — DOCUSATE SODIUM 100 MG PO CAPS: 100.0000 mg | ORAL_CAPSULE | Freq: Every day | ORAL | Status: DC

## 2015-05-23 MED ORDER — ASPIRIN 300 MG RE SUPP
300.0000 mg | Freq: Every day | RECTAL | Status: DC
  Administered 2015-05-23 – 2015-05-24 (×2): 300 mg via RECTAL
  Filled 2015-05-23 (×2): qty 1

## 2015-05-23 MED ORDER — ONDANSETRON HCL 4 MG/2ML IJ SOLN
INTRAMUSCULAR | Status: AC
  Administered 2015-05-23: 4 mg via INTRAVENOUS
  Filled 2015-05-23: qty 2

## 2015-05-23 MED ORDER — MEMANTINE HCL 5 MG PO TABS
5.0000 mg | ORAL_TABLET | Freq: Every day | ORAL | Status: DC
  Filled 2015-05-23: qty 1

## 2015-05-23 MED ORDER — INSULIN ASPART 100 UNIT/ML ~~LOC~~ SOLN: 0.0000 [IU] | Freq: Three times a day (TID) | SUBCUTANEOUS | Status: DC

## 2015-05-23 MED ORDER — SUCCINYLCHOLINE CHLORIDE 20 MG/ML IJ SOLN
INTRAMUSCULAR | Status: AC
  Filled 2015-05-23: qty 1

## 2015-05-23 MED ORDER — HEPARIN SODIUM (PORCINE) 5000 UNIT/ML IJ SOLN
5000.0000 [IU] | Freq: Three times a day (TID) | INTRAMUSCULAR | Status: DC
  Administered 2015-05-23 – 2015-05-24 (×3): 5000 [IU] via SUBCUTANEOUS
  Filled 2015-05-23 (×3): qty 1

## 2015-05-23 MED ORDER — DONEPEZIL HCL 10 MG PO TABS: 10.0000 mg | ORAL_TABLET | Freq: Every day | ORAL | Status: DC

## 2015-05-23 MED ORDER — ETOMIDATE 2 MG/ML IV SOLN
INTRAVENOUS | Status: AC
  Filled 2015-05-23: qty 20

## 2015-05-23 NOTE — ED Notes (Addendum)
Per EMS- Pt was at MD office when she had a syncopal episode. Pt lethargic and vomitting. Given 4mg  Zofran IV. LSN 1500. BP 140s. CBG 134. Several syncopal episodes over the past several months. PIV 20G RFA.

## 2015-05-23 NOTE — ED Provider Notes (Signed)
Arrival Date & Time: 05/23/15 & 1604 History   Chief Complaint  Patient presents with  . Code Stroke   HPI [name] is a 81 y.o. female who presents as a code stroke that was subsequently canceled upon arrival after EMS brought patient from physician's office this afternoon. Patient has history of advanced dementia and her supplemental history which I obtained from the patient's son due to my obtaining history of present illness family medical past medical past surgical social history allergy history and review of systems being limited to patient's altered mental status. I therefore envoke the E&M caveat. Per patient's son she has had several episodes where her mental status acutely fluctuates. Patient had significant episodes of approximate 6 to 8 emesis events. No blood noted and only stomach contents present.   Past Medical History  I reviewed & agree with nursing's documentation on PMHx, PSHx, SHx and FHx. Past Medical History  Diagnosis Date  . Dementia   . Arthritis    History reviewed. No pertinent past surgical history. Social History   Social History  . Marital Status: Widowed    Spouse Name: N/A  . Number of Children: N/A  . Years of Education: N/A   Social History Main Topics  . Smoking status: Never Smoker   . Smokeless tobacco: None  . Alcohol Use: No  . Drug Use: None  . Sexual Activity: No   Other Topics Concern  . None   Social History Narrative   Family History  Problem Relation Age of Onset  . Hypertension Mother   . Hypertension Father     Review of Systems  ROS limited as stated above in HPI.  Allergies  Review of patient's allergies indicates no known allergies.  Home Medications   Prior to Admission medications   Medication Sig Start Date End Date Taking? Authorizing Provider  Cholecalciferol (VITAMIN D3) 2000 UNITS TABS Take 1 tablet by mouth daily.   Yes Historical Provider, MD  docusate sodium (COLACE) 100 MG capsule Take 100  mg by mouth daily.   Yes Historical Provider, MD  donepezil (ARICEPT) 10 MG tablet Take 10 mg by mouth daily.    Yes Historical Provider, MD  memantine (NAMENDA) 5 MG tablet Take 5 mg by mouth daily.     Yes Historical Provider, MD  ciprofloxacin (CIPRO) 500 MG tablet Take 1 tablet (500 mg total) by mouth 2 (two) times daily. 05/24/15   Costin Otelia Sergeant, MD    Physical Exam  BP 154/74 mmHg  Pulse 89  Temp(Src) 98.6 F (37 C) (Oral)  Resp 18  Ht  (1.651 m)  Wt 116 lb 10 oz (52.9 kg)  BMI 19.41 kg/m2  SpO2 96% Physical Exam  Constitutional: She appears listless. She appears cachectic.  Non-toxic appearance. She has a sickly appearance. She appears ill. No distress.  HENT:  Head: Normocephalic and atraumatic.  Right Ear: External ear normal.  Left Ear: External ear normal.  Eyes: Pupils are equal, round, and reactive to light. No scleral icterus.  Neck: Normal range of motion. Neck supple. JVD present. No tracheal deviation present.  Cardiovascular: Intact distal pulses.   Murmur heard. Pulmonary/Chest: Effort normal and breath sounds normal. No stridor. No respiratory distress.  Abdominal: Soft. She exhibits no distension. There is no tenderness. There is no rebound and no guarding.  Musculoskeletal: Normal range of motion.  Neurological: She has normal strength. She appears listless. She is disoriented. She displays atrophy. No cranial nerve deficit or sensory  deficit.  Skin: Skin is warm and dry. No pallor.  Psychiatric: She has a normal mood and affect. She is slowed. Cognition and memory are impaired. She exhibits abnormal recent memory.  Nursing note and vitals reviewed.  ED Course  Procedures Labs Review Labs Reviewed  PROTIME-INR - Abnormal; Notable for the following:    Prothrombin Time 16.4 (*)    All other components within normal limits  COMPREHENSIVE METABOLIC PANEL - Abnormal; Notable for the following:    Potassium 3.2 (*)    CO2 21 (*)    Glucose, Bld 146  (*)    All other components within normal limits  URINALYSIS, ROUTINE W REFLEX MICROSCOPIC (NOT AT [facility]) - Abnormal; Notable for the following:    APPearance HAZY (*)    Nitrite POSITIVE (*)    Leukocytes, UA SMALL (*)    All other components within normal limits  URINE MICROSCOPIC-ADD ON - Abnormal; Notable for the following:    Bacteria, UA MANY (*)    All other components within normal limits  LIPID PANEL - Abnormal; Notable for the following:    LDL Cholesterol 111 (*)    All other components within normal limits  GLUCOSE, CAPILLARY - Abnormal; Notable for the following:    Glucose-Capillary 113 (*)    All other components within normal limits  I-STAT CHEM 8, ED - Abnormal; Notable for the following:    Potassium 3.1 (*)    Glucose, Bld 141 (*)    Calcium, Ion 1.10 (*)    Hemoglobin 15.3 (*)    All other components within normal limits  URINE CULTURE  ETHANOL  APTT  CBC  DIFFERENTIAL  URINE RAPID DRUG SCREEN, HOSP PERFORMED  TROPONIN I  GLUCOSE, CAPILLARY  GLUCOSE, CAPILLARY  HEMOGLOBIN A1C  HEMOGLOBIN A1C  I-STAT TROPOININ, ED   Imaging Review Ct Head Wo Contrast  05/23/2015  CLINICAL DATA:  81 year old female with acute syncope and altered mental status today. EXAM: CT HEAD WITHOUT CONTRAST TECHNIQUE: Contiguous axial images were obtained from the base of the skull through the vertex without intravenous contrast. COMPARISON:  12/06/2014 and prior head CTs FINDINGS: Moderate-severe atrophy and moderate chronic small-vessel white matter ischemic changes again noted. Remote left occipital infarct again noted. No acute intracranial abnormalities are identified, including mass lesion or mass effect, hydrocephalus, extra-axial fluid collection, midline shift, hemorrhage, or acute infarction. The visualized bony calvarium is unremarkable. IMPRESSION: No evidence of acute intracranial abnormality. Atrophy, chronic small-vessel white matter ischemic changes and remote left occipital  infarct. Critical Value/emergent results were called by telephone at the time of interpretation on 05/23/2015 at 4:44 pm to Dr. Roseanne Reno, who verbally acknowledged these results. Electronically Signed   By: Harmon Pier M.D.   On: 05/23/2015 16:45   Mr Brain Wo Contrast  05/24/2015  ADDENDUM REPORT: 05/24/2015 14:50 ADDENDUM: Study discussed by telephone with Dr. Freda Munro on 05/24/2015 at 1420 hours. Electronically Signed   By: Odessa Fleming M.D.   On: 05/24/2015 14:50  05/24/2015  CLINICAL DATA:  81 year old female with syncope or seizure. Episode of unresponsiveness followed by vomiting. Initial encounter. EXAM: MRI HEAD WITHOUT CONTRAST MRA HEAD WITHOUT CONTRAST TECHNIQUE: Multiplanar, multiecho pulse sequences of the brain and surrounding structures were obtained without intravenous contrast. Angiographic images of the head were obtained using MRA technique without contrast. COMPARISON:  Head CT without contrast 05/23/2015. Brain MRI 08/31/2014. FINDINGS: MRI HEAD FINDINGS Major intracranial vascular flow voids are stable. Stable cerebral volume. No restricted diffusion to suggest acute infarction.  No midline shift, mass effect, evidence of mass lesion, ventriculomegaly, extra-axial collection or acute intracranial hemorrhage. Cervicomedullary junction and pituitary are within normal limits. Stable gray and white matter signal. Confluent, patchy bilateral cerebral white matter T2 and FLAIR hyperintensity. Chronic encephalomalacia in both occipital poles greater on the left. No chronic cerebral blood products. Chronic lacunar infarcts in both thalami. Brainstem and cerebellum are within normal limits for age. Visible internal auditory structures appear normal. Trace mastoid fluid on the left is stable. Decreased retained secretions in the nasopharynx. Stable mild paranasal sinus mucosal thickening. Orbit and scalp soft tissues are stable. MRA HEAD FINDINGS Antegrade flow in the posterior circulation. Fairly  codominant distal vertebral arteries. PICA origins appear patent. Patent basilar artery with mild distal basilar stenosis. SCA and left PCA origins are within normal limits. The right PCA is occluded just beyond its origin. Posterior communicating arteries are diminutive or absent. Moderate to severe tandem stenoses in the left PCA P2 and P3 segments. Antegrade flow in both ICA siphons no siphon stenosis. Carotid termini are patent. Ophthalmic artery origins within normal limits. MCA and ACA origins within normal limits. Dominant right ACA A1 segment. Mild left A1 stenosis. Anterior communicating artery is within normal limits. Moderate tandem stenoses in the left ACA A2 and distal branches with preserved distal flow. MCA M1 segments and bifurcations are within normal limits. No MCA branch occlusion identified. IMPRESSION: 1. No acute intracranial abnormality. Chronic ischemic disease appears stable since February. 2. 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 Electronically Signed: By: Odessa Fleming M.D. On: 05/24/2015 14:06   Mr Maxine Glenn Head/brain Wo Cm  05/24/2015  ADDENDUM REPORT: 05/24/2015 14:50 ADDENDUM: Study discussed by telephone with Dr. Freda Munro on 05/24/2015 at 1420 hours. Electronically Signed   By: Odessa Fleming M.D.   On: 05/24/2015 14:50  05/24/2015  CLINICAL DATA:  81 year old female with syncope or seizure. Episode of unresponsiveness followed by vomiting. Initial encounter. EXAM: MRI HEAD WITHOUT CONTRAST MRA HEAD WITHOUT CONTRAST TECHNIQUE: Multiplanar, multiecho pulse sequences of the brain and surrounding structures were obtained without intravenous contrast. Angiographic images of the head were obtained using MRA technique without contrast. COMPARISON:  Head CT without contrast 05/23/2015. Brain MRI 08/31/2014. FINDINGS: MRI HEAD FINDINGS Major intracranial vascular flow voids are stable.  Stable cerebral volume. No restricted diffusion to suggest acute infarction. No midline shift, mass effect, evidence of mass lesion, ventriculomegaly, extra-axial collection or acute intracranial hemorrhage. Cervicomedullary junction and pituitary are within normal limits. Stable gray and white matter signal. Confluent, patchy bilateral cerebral white matter T2 and FLAIR hyperintensity. Chronic encephalomalacia in both occipital poles greater on the left. No chronic cerebral blood products. Chronic lacunar infarcts in both thalami. Brainstem and cerebellum are within normal limits for age. Visible internal auditory structures appear normal. Trace mastoid fluid on the left is stable. Decreased retained secretions in the nasopharynx. Stable mild paranasal sinus mucosal thickening. Orbit and scalp soft tissues are stable. MRA HEAD FINDINGS Antegrade flow in the posterior circulation. Fairly codominant distal vertebral arteries. PICA origins appear patent. Patent basilar artery with mild distal basilar stenosis. SCA and left PCA origins are within normal limits. The right PCA is occluded just beyond its origin. Posterior communicating arteries are diminutive or absent. Moderate to severe tandem stenoses in the left PCA P2 and P3 segments. Antegrade flow in both ICA siphons no siphon stenosis. Carotid termini  are patent. Ophthalmic artery origins within normal limits. MCA and ACA origins within normal limits. Dominant right ACA A1 segment. Mild left A1 stenosis. Anterior communicating artery is within normal limits. Moderate tandem stenoses in the left ACA A2 and distal branches with preserved distal flow. MCA M1 segments and bifurcations are within normal limits. No MCA branch occlusion identified. IMPRESSION: 1. No acute intracranial abnormality. Chronic ischemic disease appears stable since February. 2. 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 Electronically Signed: By: Odessa FlemingH  Hall M.D. On: 05/24/2015 14:06    Laboratory and Imaging results were personally reviewed by myself and used in the medical decision making of this patient's treatment and disposition.  EKG Interpretation  EKG Interpretation  Date/Time:  Thursday May 23 2015 16:19:12 EDT Ventricular Rate:  58 PR Interval:  149 QRS Duration: 98 QT Interval:  464 QTC Calculation: 456 R Axis:   10 Text Interpretation:  Sinus rhythm Anterior infarct, old No significant change since last tracing Confirmed by KNAPP  MD-J, JON [id number]) on 05/23/2015 4:30:11 PM      MDM  [name] is a 81 y.o. female with H&P as above. ED clinical course as follows: Due to abnormal mental status and concerning presentation for possible seizure versus syncope event patient requires neurology consultation and subsequent MRA MRI with possible EEG as inpatient for monitoring of neurologic function and for possible seizure versus ischemic etiology. Laboratory work reveals hypokalemia and no other remarkable abnormalities on CMP troponin obtained after 3 hours of observation is initially negative and upon repeat remains negative. No leukocytosis or abnormalities on CMP. INR and a PTT within normal limits and only mild elevation in PT. Urine reveals concern for area tract infection and in light of negative CVA tenderness or flank pain or systemic symptoms do not suspect pyelonephritis however will treat with ceftriaxone intravenously 1 g after obtaining urinary cultures. I reviewed patient's medical chart which revealed prior urinary cultures are sensitive to Rocephin. Consultation obtained from hospital service as patient requires continued observation and management as inpatient. CT head without contrast reveals no evidence of acute intracranial abnormality. Patient hemodynamically stable and appropriate for transport to floor at this  time.  Disposition: Admit  Clinical Impression:  1. Syncope   2. Syncope   3. Syncope   4. Syncope    Patient care discussed with Dr. Lynelle DoctorKnapp, who oversaw their evaluation & treatment & voiced agreement. House Officer: Jonette EvaBrad Jacklynn Dehaas, MD, Emergency Medicine.  Jonette EvaBrad Zeya Balles, MD 05/25/15 [id number]  Linwood DibblesJon Knapp, MD 05/27/15 [phone]

## 2015-05-23 NOTE — H&P (Signed)
[facility] History and Physical  KRYSTI HICKLING ZOX:[id number] DOB: 1920-12-31 DOA: 05/23/2015  Referring physician: Jonette Eva, MD PCP: Pcp Not In System   Chief Complaint: Possible Syncope  HPI: [name] is a 81 y.o. female with history of dementia presents to the ED after a possible seizure or syncope. She is not able to provide any history due to dementia. Apparently patient was getting out of her car after visiting her PCP and stated to her son she did not feel well. Patient at that time became slouched over to the ground. Patient was obtunded and non-verbal after that. She also started to have vomiting when EMS was there they gave her repeated doses of zofran. Patient apparently has been having these episodes frequently according to her son. Patient had some diaphoresis. At home she has these episodes and usually the son states that he watches her and then after half an hour or so she comes back to baseline. Now she is back to baseline with her dementia. Her son states that she had a similar episode this morning while she was in the bathroom. She has had most of the episodes when she is straining in the bathroom until today.   Review of Systems:  Patient is not able to provide any ROS.   Past Medical History  Diagnosis Date  . Dementia   . Arthritis    No past surgical history on file. Social History:  reports that she has never smoked. She does not have any smokeless tobacco history on file. She reports that she does not drink alcohol. Her drug history is not on file.  No Known Allergies  Family History  Problem Relation Age of Onset  . Hypertension Mother   . Hypertension Father      Prior to Admission medications   Medication Sig Start Date End Date Taking? Authorizing Provider  Cholecalciferol (VITAMIN D3) 2000 UNITS TABS Take 1 tablet by mouth daily.   Yes Historical Provider, MD  docusate sodium (COLACE) 100 MG capsule Take 100 mg by mouth  daily.   Yes Historical Provider, MD  donepezil (ARICEPT) 10 MG tablet Take 10 mg by mouth daily.    Yes Historical Provider, MD  memantine (NAMENDA) 5 MG tablet Take 5 mg by mouth daily.     Yes Historical Provider, MD   Physical Exam: Filed Vitals:   05/23/15 1915 05/23/15 1930 05/23/15 1945 05/23/15 2000  BP: 140/95 138/65 164/73 157/76  Pulse: 77 76 87 79  Resp: 21 17 36 18  SpO2: 93% 96% 100% 98%    Wt Readings from Last 3 Encounters:  07/01/13 64.456 kg (142 lb 1.6 oz)    General:  Appears calm and comfortable Eyes: PERRL, normal lids, irises & conjunctiva ENT: grossly normal hearing, lips & tongue Neck: no LAD, masses or thyromegaly Cardiovascular: RRR, no m/r/g. No LE edema. Respiratory: CTA bilaterally, no w/r/r. Normal respiratory effort. Abdomen: soft, ntnd Skin: no rash or induration seen on limited exam Musculoskeletal: grossly normal tone BUE/BLE Psychiatric: talkative but does not make sense Neurologic: grossly moving all four extremities          Labs on Admission:  Basic Metabolic Panel:  Recent Labs Lab 05/23/15 1618 05/23/15 1621  NA 140 143  K 3.2* 3.1*  CL 105 103  CO2 21*  --   GLUCOSE 146* 141*  BUN 14 16  CREATININE 0.82 0.80  CALCIUM 9.3  --    Liver Function Tests:  Recent Labs Lab  05/23/15 1618  AST 28  ALT 17  ALKPHOS 62  BILITOT 0.9  PROT 6.9  ALBUMIN 3.7   No results for input(s): LIPASE, AMYLASE in the last 168 hours. No results for input(s): AMMONIA in the last 168 hours. CBC:  Recent Labs Lab 05/23/15 1618 05/23/15 1621  WBC 9.9  --   NEUTROABS 6.2  --   HGB 14.5 15.3*  HCT 41.8 45.0  MCV 88.2  --   PLT 263  --    Cardiac Enzymes: No results for input(s): CKTOTAL, CKMB, CKMBINDEX, TROPONINI in the last 168 hours.  BNP (last 3 results) No results for input(s): BNP in the last 8760 hours.  ProBNP (last 3 results) No results for input(s): PROBNP in the last 8760 hours.  CBG: No results for input(s):  GLUCAP in the last 168 hours.  Radiological Exams on Admission: Ct Head Wo Contrast  05/23/2015  CLINICAL DATA:  81 year old female with acute syncope and altered mental status today. EXAM: CT HEAD WITHOUT CONTRAST TECHNIQUE: Contiguous axial images were obtained from the base of the skull through the vertex without intravenous contrast. COMPARISON:  12/06/2014 and prior head CTs FINDINGS: Moderate-severe atrophy and moderate chronic small-vessel white matter ischemic changes again noted. Remote left occipital infarct again noted. No acute intracranial abnormalities are identified, including mass lesion or mass effect, hydrocephalus, extra-axial fluid collection, midline shift, hemorrhage, or acute infarction. The visualized bony calvarium is unremarkable. IMPRESSION: No evidence of acute intracranial abnormality. Atrophy, chronic small-vessel white matter ischemic changes and remote left occipital infarct. Critical Value/emergent results were called by telephone at the time of interpretation on 05/23/2015 at 4:44 pm to Dr. Roseanne RenoStewart, who verbally acknowledged these results. Electronically Signed   By: Harmon PierJeffrey  Hu M.D.   On: 05/23/2015 16:45      Assessment/Plan Active Problems:   Syncope   Dementia   Hyperglycemia   1. Possible Syncope vs Seizure -neurology consultation noted -will get MRA MRI -will get EEG -agree with outpatient cardiac monitoring -place on seizure precaution and fall precautions  2. Dementia -will continue with aricept -continue with namenda  3. Hyperglycemia -will check A1C -will place on FSBS and SSI as needed     Code Status: full code (must indicate code status--if unknown or must be presumed, indicate so) DVT Prophylaxis:heparin Family Communication: son (indicate person spoken with, if applicable, with phone number if by telephone) Disposition Plan: home (indicate anticipated LOS)    [facility] LLCKHAN,SAADAT A [facility] Pager [phone]

## 2015-05-23 NOTE — ED Notes (Signed)
Attempt to call report to [phone]

## 2015-05-23 NOTE — ED Notes (Signed)
NIH charted by rapid response nurse

## 2015-05-23 NOTE — Code Documentation (Signed)
81 year old female presents to [facility] as code stroke via [facility].  Patient was on way to routine MD visit - according to her son - they stopped to eat - she was at baseline - they got to MD office - she took about 10 steps - stopped stated she felt sick - vomited and collapsed.  EMS was called and called code stroke in the field.  On arrival to ED she was stupurous - vomit present in oral cavity - ? Of airway protection.  Met at the bridge by Dr. Nicole Kindred - stroke team and EDP.  Initially roomed to assess airway.  She had second episode of vomiting.  Additional dose of $Remov'4mg'Eblydf$  Zofran IV given.  Had bilaterally muscle tone present.  EDP prepping for intubation after discussion with son - when patient became more awake.  Opening eyes - resps reg and unlabored - able to speak and follow a few commands.  O2 sats 100% on NRB mask.   VSS.  Transported to CT scan with Dr. Nicole Kindred and ED staff.  Tol CT.  Returned to ED.  More alert - follows simple commands - MAE x 4 - c/o some nausea and points to abdomen.  No focal deficits noted. See notes for initial NIHSS and repeat as she became more awake.   Son states she has similar episodes at home when having BM.  Dr. Nicole Kindred present - code stroke cancelled.  Handoff to [employer].  To call as needed.

## 2015-05-23 NOTE — ED Notes (Signed)
MD cleared patient for CT scan.

## 2015-05-23 NOTE — Consult Note (Signed)
Referring Physician: ED MD    Chief Complaint: syncope with AMS  HPI:                                                                                                                                         [name] is an 81 y.o. female with known sever dementia who was brought to her PCP today for a well visit check up.  At 1500 hours she got out of her car "stated I do not feel well, then slouched to the ground and through up becoming obtunded per son".  EMS was called and in route she was given multiple doses of Zofran due to having significant emesis. She improved rapidly in the emergency room and able to protect her airway. Intubation was deferred area Per son she has had many episodes just like this at home where she will become severely diaphoretic then either vomit or defecate followed by 30 minutes of obtunded sensorium then return to baseline. He usually will just watches at home her during event. CT scan of her head showed no acute intracranial abnormality. No focal deficits were noted on examination. Code stroke was canceled. Patient is being admitted for evaluation of recurrent syncopal spells.  Date last known well: Date: 05/23/2015 Time last known well: Time: 15:00 tPA Given: No: not stroke like symptoms     Past Medical History  Diagnosis Date  . Dementia   . Arthritis     No past surgical history on file.  Family History  Problem Relation Age of Onset  . Hypertension Mother   . Hypertension Father    Social History:  reports that she has never smoked. She does not have any smokeless tobacco history on file. She reports that she does not drink alcohol. Her drug history is not on file.  Allergies: No Known Allergies  Medications:                                                                                                                           Current Facility-Administered Medications  Medication Dose Route Frequency Provider Last Rate Last Dose  .  etomidate (AMIDATE) 2 MG/ML injection           . lidocaine (cardiac) 100 mg/365ml (XYLOCAINE) 20 MG/ML injection 2%           . ondansetron (ZOFRAN) 4  MG/2ML injection           . rocuronium (ZEMURON) 50 MG/5ML injection           . succinylcholine (ANECTINE) 20 MG/ML injection            Current Outpatient Prescriptions  Medication Sig Dispense Refill  . Cholecalciferol (VITAMIN D3) 2000 UNITS TABS Take 1 tablet by mouth daily.    Marland Kitchen docusate sodium (COLACE) 100 MG capsule Take 100 mg by mouth daily.    Marland Kitchen donepezil (ARICEPT) 10 MG tablet Take 10 mg by mouth daily.     . memantine (NAMENDA) 5 MG tablet Take 5 mg by mouth daily.         ROS:                                                                                                                                       History obtained from unobtainable from patient due to mental status   Neurologic Examination:                                                                                                      There were no vitals taken for this visit.  HEENT-  Normocephalic, no lesions, without obvious abnormality.  Normal external eye and conjunctiva.  Normal TM's bilaterally.  Normal auditory canals and external ears. Normal external nose, mucus membranes and septum.  Normal pharynx. Cardiovascular- S1, S2 normal, pulses palpable throughout   Lungs- chest clear, no wheezing, rales, normal symmetric air entry Abdomen- normal findings: bowel sounds normal Extremities- no edema Lymph-no adenopathy palpable Musculoskeletal-no joint tenderness, deformity or swelling Skin-warm and dry, no hyperpigmentation, vitiligo, or suspicious lesions  Neurological Examination Mental Status: Obtunded only responding to noxious stimuli but will briskly lift extremity to pain. Only states "ow" to pain.    Cranial Nerves: II: Discs flat bilaterally; blinks to threat bilaterally, pupils equal, round, reactive to light and accommodation III,IV, VI:  ptosis not present, dolls intact, no gaze preference V,VII: face symmetric, corneal intact VIII: Moderate loss of hearing bilaterally  Motor: No drift of upper or lower extremities Sensory: Pinprick and light touch intact throughout, bilaterally Deep Tendon Reflexes: 2+ and symmetric throughout Plantars: Right: downgoing   Left: downgoing Cerebellar:   Lab Results: Basic Metabolic Panel: No results for input(s): NA, K, CL, CO2, GLUCOSE, BUN, CREATININE, CALCIUM, MG, PHOS in the last 168 hours.  Liver Function Tests: No results for input(s): AST, ALT, ALKPHOS, BILITOT, PROT, ALBUMIN  in the last 168 hours. No results for input(s): LIPASE, AMYLASE in the last 168 hours. No results for input(s): AMMONIA in the last 168 hours.  CBC:  Recent Labs Lab 05/23/15 1618  WBC 9.9  NEUTROABS 6.2  HGB 14.5  HCT 41.8  MCV 88.2  PLT 263    Cardiac Enzymes: No results for input(s): CKTOTAL, CKMB, CKMBINDEX, TROPONINI in the last 168 hours.  Lipid Panel: No results for input(s): CHOL, TRIG, HDL, CHOLHDL, VLDL, LDLCALC in the last 168 hours.  CBG: No results for input(s): GLUCAP in the last 168 hours.  Microbiology: Results for orders placed or performed during the hospital encounter of 08/31/14  Urine culture     Status: None   Collection Time: 08/31/14  6:06 PM  Result Value Ref Range Status   Specimen Description URINE, CATHETERIZED  Final   Special Requests ADDED [phone]  Final   Colony Count   Final    >=100,000 COLONIES/ML Performed at [facility]    Culture   Final    KLEBSIELLA PNEUMONIAE Performed at [facility]    Report Status 09/03/2014 FINAL  Final   Organism ID, Bacteria KLEBSIELLA PNEUMONIAE  Final      Susceptibility   Klebsiella pneumoniae - MIC*    AMPICILLIN >=32 RESISTANT Resistant     CEFAZOLIN <=4 SENSITIVE Sensitive     CEFTRIAXONE <=1 SENSITIVE Sensitive     CIPROFLOXACIN <=0.25 SENSITIVE Sensitive     GENTAMICIN <=1  SENSITIVE Sensitive     LEVOFLOXACIN <=0.12 SENSITIVE Sensitive     NITROFURANTOIN 64 INTERMEDIATE Intermediate     TOBRAMYCIN <=1 SENSITIVE Sensitive     TRIMETH/SULFA <=20 SENSITIVE Sensitive     PIP/TAZO <=4 SENSITIVE Sensitive     * KLEBSIELLA PNEUMONIAE  Blood culture (routine x 2)     Status: None   Collection Time: 08/31/14  7:52 PM  Result Value Ref Range Status   Specimen Description BLOOD LEFT ARM  Final   Special Requests BOTTLES DRAWN AEROBIC AND ANAEROBIC 10CC  Final   Culture   Final    NO GROWTH 5 DAYS Performed at [facility]    Report Status 09/07/2014 FINAL  Final  Blood culture (routine x 2)     Status: None   Collection Time: 08/31/14  8:03 PM  Result Value Ref Range Status   Specimen Description BLOOD LEFT FOREARM  Final   Special Requests BOTTLES DRAWN AEROBIC AND ANAEROBIC R 3CC B 5CC  Final   Culture   Final    NO GROWTH 5 DAYS Performed at [facility]    Report Status 09/07/2014 FINAL  Final  MRSA PCR Screening     Status: None   Collection Time: 08/31/14  9:38 PM  Result Value Ref Range Status   MRSA by PCR NEGATIVE NEGATIVE Final    Comment:        The GeneXpert MRSA Assay (FDA approved for NASAL specimens only), is one component of a comprehensive MRSA colonization surveillance program. It is not intended to diagnose MRSA infection nor to guide or monitor treatment for MRSA infections.     Coagulation Studies: No results for input(s): LABPROT, INR in the last 72 hours.  Imaging: No results found.  Felicie Morn PA-C [facility] [phone]  05/23/2015, 4:24 PM   Assessment: 81 y.o. female presenting with probable recurrent syncopal spell, etiology of which is unclear. Seizure disorder is less likely but cannot be completely ruled out, particularly with a  period of obtundation associated with each of her spells. Exam showed no focal findings. She had no clinical signs of TIA or  stroke.  Recommendations: 1. MRI of the brain without contrast 2. EEG, routine adult study 3. Cardiac telemetry. Consider long-term cardiac monitoring on an outpatient basis if telemetry monitoring is unremarkable.  Stroke Risk Factors - none  I personally participated in this patient's evaluation and management, including examination, as well as formulating above clinical impression and management recommendations.  Venetia Maxon M.D. [facility] [phone]

## 2015-05-24 ENCOUNTER — Inpatient Hospital Stay (HOSPITAL_COMMUNITY)
Admission: EM | Admit: 2015-05-24 | Discharge: 2015-05-24 | Disposition: A | Discharge status: Home / Self Care | Payer: Medicare Other | Attending: Internal Medicine | Admitting: Internal Medicine

## 2015-05-24 ENCOUNTER — Inpatient Hospital Stay (HOSPITAL_COMMUNITY): Payer: Medicare Other

## 2015-05-24 DIAGNOSIS — F0390 Unspecified dementia without behavioral disturbance: Secondary | ICD-10-CM

## 2015-05-24 DIAGNOSIS — R55 Syncope and collapse: Secondary | ICD-10-CM

## 2015-05-24 DIAGNOSIS — R4182 Altered mental status, unspecified: Secondary | ICD-10-CM

## 2015-05-24 DIAGNOSIS — R739 Hyperglycemia, unspecified: Secondary | ICD-10-CM

## 2015-05-24 DIAGNOSIS — N3000 Acute cystitis without hematuria: Secondary | ICD-10-CM

## 2015-05-24 DIAGNOSIS — N390 Urinary tract infection, site not specified: Secondary | ICD-10-CM | POA: Diagnosis present

## 2015-05-24 LAB — GLUCOSE, CAPILLARY
Glucose-Capillary: 113 mg/dL — ABNORMAL HIGH (ref 65–99)
Glucose-Capillary: 84 mg/dL (ref 65–99)
Glucose-Capillary: 87 mg/dL (ref 65–99)

## 2015-05-24 LAB — LIPID PANEL
Cholesterol: 168 mg/dL (ref 0–200)
HDL: 43 mg/dL (ref >40)
LDL CALC: 111 mg/dL — AB (ref 0–99)
Total CHOL/HDL Ratio: 3.9 RATIO
Triglycerides: 70 mg/dL (ref <150)
VLDL: 14 mg/dL (ref 0–40)

## 2015-05-24 MED ORDER — CIPROFLOXACIN HCL 500 MG PO TABS: 500.0000 mg | ORAL_TABLET | Freq: Two times a day (BID) | ORAL | Status: DC

## 2015-05-24 MED ORDER — DEXTROSE 5 % IV SOLN
1.0000 g | INTRAVENOUS | Status: DC
  Administered 2015-05-24: 1 g via INTRAVENOUS
  Filled 2015-05-24 (×2): qty 10

## 2015-05-24 NOTE — Evaluation (Signed)
Clinical/Bedside Swallow Evaluation Patient Details  Name: [name] MRN: [id number] Date of Birth: 03-Nov-1920  Today's Date: 05/24/2015 Time: SLP Start Time (ACUTE ONLY): 1617 SLP Stop Time (ACUTE ONLY): 1630 SLP Time Calculation (min) (ACUTE ONLY): 13 min  Past Medical History:  Past Medical History  Diagnosis Date  . Dementia   . Arthritis    Past Surgical History: History reviewed. No pertinent past surgical history. HPI:  81 y.o. female with history of dementia presents to the ED after possible seizure or syncope.  MRI no acute abnormality.  Other tests pending.  Swallow eval ordered.    Assessment / Plan / Recommendation Clinical Impression  Pt presents with quite functional swallow with adequate mastication, brisk swallow response, and no s/s of aspiration.  Recommend advancing diet to regular diet, thin liquids; no f/u needed.      Aspiration Risk  Mild    Diet Recommendation Age appropriate regular solids;Thin   Medication Administration: Whole meds with liquid    Other  Recommendations Oral Care Recommendations: Oral care BID    Swallow Study Prior Functional Status       General Date of Onset: 05/24/15 Other Pertinent Information: 81 y.o. female with history of dementia presents to the ED after possible seizure or syncope.  MRI no acute abnormality.  Other tests pending.  Swallow eval ordered.  Type of Study: Bedside swallow evaluation Previous Swallow Assessment: no Diet Prior to this Study: NPO Temperature Spikes Noted: No Respiratory Status: Room air History of Recent Intubation: No Behavior/Cognition: Alert;Confused Oral Cavity - Dentition: Edentulous (dentures) Self-Feeding Abilities: Able to feed self Patient Positioning: Upright in bed Baseline Vocal Quality: Normal Volitional Cough: Strong Volitional Swallow: Able to elicit    Oral/Motor/Sensory Function Overall Oral Motor/Sensory Function: Appears within functional limits for tasks  assessed   Ice Chips Ice chips: Within functional limits   Thin Liquid Thin Liquid: Within functional limits Presentation: Cup    Nectar Thick Nectar Thick Liquid: Not tested   Honey Thick Honey Thick Liquid: Not tested   Puree Puree: Within functional limits   Solid  [name], [place] CCC/SLP Pager [phone]     Solid: Within functional limits       Blenda MountsCouture, Delainee Tramel Laurice 05/24/2015,4:32 PM

## 2015-05-24 NOTE — Progress Notes (Signed)
  Echocardiogram 2D Echocardiogram has been performed.  Janalyn HarderWest, Joselito Fieldhouse R 05/24/2015, 3:13 PM

## 2015-05-24 NOTE — Progress Notes (Signed)
Patient discharged home with son patient is back to baseline, IV removed, prescription provided, discharge summary reviewed with son.

## 2015-05-24 NOTE — Progress Notes (Signed)
PT Cancellation Note  Patient Details Name: [name] MRN: [id number] DOB: 12-15-1920   Cancelled Treatment:    Reason Eval/Treat Not Completed: Patient not medically ready.   Discussed pt case with RN who states that pt is very lethargic and difficult to arouse at this time. He also reports that pt may be transitioning to comfort care in the future. Will hold PT eval at this time and check back tomorrow. Will also continue to assess appropriateness for therapy after goals of care have been established.    Conni SlipperKirkman, Zykeria Laguardia 05/24/2015, 11:25 AM   Conni SlipperLaura Chaia Ikard, PT, DPT Acute Rehabilitation Services Pager: [phone]

## 2015-05-24 NOTE — Progress Notes (Signed)
Regular meal tray ordered for patient from service response.

## 2015-05-24 NOTE — Progress Notes (Deleted)
PROGRESS NOTE  [name] AVW:[id number] DOB: [birth year] DOA: 05/23/2015 PCP: Pcp Not In System   HPI: [name] is a 81 y.o. female with history of dementia presents to the ED after a possible seizure or syncope. She is not able to provide any history due to dementia. Apparently patient was getting out of her car after visiting her PCP and stated to her son she did not feel well. Patient at that time became slouched over to the ground. Patient was obtunded and non-verbal after that. She also started to have vomiting when EMS was there they gave her repeated doses of zofran. Patient apparently has been having these episodes frequently according to her son.   Subjective / 24 H Interval events - patient sleeping this morning  Assessment/Plan: Active Problems:   Syncope   Dementia   Hyperglycemia   UTI (urinary tract infection)   Seizure vs syncope - neuro consulted, appreciate input - MRI pending - EEG pending - 2D echo pending, if no structural disease outpatient cardiac monitoring - telemetry reviewed, PAC and PVC  Dementia - close to baseline, continue home medications  Hyperglycemia - A1C in process - SSI  Probable UTI - urine cultures pending, start Ceftriaxone. No history of multi drug resistant organisms   Diet: Diet NPO time specified Fluids: NS DVT Prophylaxis: heparin s.q  Code Status: Full Code Family Communication: d/w son bedside  Disposition Plan: home when ready   Barriers to discharge: PT eval, echo, MRI  Consultants:  Neurology   Procedures:  None    Antibiotics Ceftriaxone 11/4 >>   Studies  Ct Head Wo Contrast  05/23/2015  CLINICAL DATA:  81 year old female with acute syncope and altered mental status today. EXAM: CT HEAD WITHOUT CONTRAST TECHNIQUE: Contiguous axial images were obtained from the base of the skull through the vertex without intravenous contrast. COMPARISON:  12/06/2014 and prior head CTs FINDINGS:  Moderate-severe atrophy and moderate chronic small-vessel white matter ischemic changes again noted. Remote left occipital infarct again noted. No acute intracranial abnormalities are identified, including mass lesion or mass effect, hydrocephalus, extra-axial fluid collection, midline shift, hemorrhage, or acute infarction. The visualized bony calvarium is unremarkable. IMPRESSION: No evidence of acute intracranial abnormality. Atrophy, chronic small-vessel white matter ischemic changes and remote left occipital infarct. Critical Value/emergent results were called by telephone at the time of interpretation on 05/23/2015 at 4:44 pm to Dr. Roseanne RenoStewart, who verbally acknowledged these results. Electronically Signed   By: Harmon PierJeffrey  Hu M.D.   On: 05/23/2015 16:45    Objective  Filed Vitals:   05/24/15 0200 05/24/15 0400 05/24/15 0600 05/24/15 0800  BP: 106/64 96/53 123/70 96/59  Pulse: 77 77 73 71  Temp: 98.8 F (37.1 C) 99.1 F (37.3 C) 97.9 F (36.6 C) 97.6 F (36.4 C)  TempSrc: Oral Axillary Axillary Axillary  Resp: 16 16 16 15   Weight:      SpO2: 99% 95% 95% 100%    Intake/Output Summary (Last 24 hours) at 05/24/15 0910 Last data filed at 05/23/15 2000  Gross per 24 hour  Intake      0 ml  Output     40 ml  Net    -40 ml   Filed Weights   05/23/15 2140  Weight: 48.6 kg (107 lb 2.3 oz)    Exam:  GENERAL: NAD  HEENT: head NCAT, no scleral icterus.   NECK: Supple.   LUNGS: Clear to auscultation. No wheezing or crackles  HEART: Regular rate and rhythm  without murmur. 2+ pulses, no JVD, no peripheral edema  ABDOMEN: Soft, non-distended, non-tender. Positive bowel sounds.  EXTREMITIES: Without any cyanosis or clubbing. Good muscle tone  NEUROLOGIC: non focal   Data Reviewed: Basic Metabolic Panel:  Recent Labs Lab 05/23/15 1618 05/23/15 1621  NA 140 143  K 3.2* 3.1*  CL 105 103  CO2 21*  --   GLUCOSE 146* 141*  BUN 14 16  CREATININE 0.82 0.80  CALCIUM 9.3  --     Liver Function Tests:  Recent Labs Lab 05/23/15 1618  AST 28  ALT 17  ALKPHOS 62  BILITOT 0.9  PROT 6.9  ALBUMIN 3.7   CBC:  Recent Labs Lab 05/23/15 1618 05/23/15 1621  WBC 9.9  --   NEUTROABS 6.2  --   HGB 14.5 15.3*  HCT 41.8 45.0  MCV 88.2  --   PLT 263  --    Cardiac Enzymes:  Recent Labs Lab 05/23/15 2225  TROPONINI <0.03   CBG:  Recent Labs Lab 05/24/15 0632  GLUCAP 113*    Scheduled Meds: . aspirin  300 mg Rectal Daily   Or  . aspirin  325 mg Oral Daily  . cefTRIAXone (ROCEPHIN)  IV  1 g Intravenous Q24H  . docusate sodium  100 mg Oral Daily  . donepezil  10 mg Oral Daily  . heparin  5,000 Units Subcutaneous 3 times per day  . insulin aspart  0-15 Units Subcutaneous TID WC  . memantine  5 mg Oral Daily   Continuous Infusions: . sodium chloride 50 mL/hr at 05/23/15 2235    Pamella Pert, MD [facility] Pager [phone]. If 7 PM - 7 AM, please contact night-coverage at [email or web address], password [email or web address] 05/24/2015, 9:10 AM  LOS: 1 day

## 2015-05-24 NOTE — Progress Notes (Signed)
SLP Cancellation Note  Patient Details Name: Coralee Rudlizabeth L Scherman MRN: [id number] DOB: [birth year]   Cancelled treatment:       Reason Eval/Treat Not Completed: Patient at procedure x 2.  If schedule allows, will attempt again to see this afternoon.    Blenda MountsCouture, Deena Shaub Laurice 05/24/2015, 12:03 PM

## 2015-05-24 NOTE — Discharge Summary (Signed)
Physician Discharge Summary  [name] WUJ:[id number] DOB: Dec 06, 1920 DOA: 05/23/2015  PCP: Pcp Not In System  Admit date: 05/23/2015 Discharge date: 05/24/2015  Time spent: < 30 minutes  Recommendations for Outpatient Follow-up:  1. Follow up with PCP in 1-2 weeks   Discharge Diagnoses:  Active Problems:   Syncope   Dementia   Hyperglycemia   UTI (urinary tract infection)  Discharge Condition: stable  Diet recommendation: regular, as tolerated  Filed Weights   05/23/15 2140 05/24/15 1600  Weight: 48.6 kg (107 lb 2.3 oz) 52.9 kg (116 lb 10 oz)    History of present illness:  [name] is a 81 y.o. female with history of dementia presents to the ED after a possible seizure or syncope. She is not able to provide any history due to dementia. Apparently patient was getting out of her car after visiting her PCP and stated to her son she did not feel well. Patient at that time became slouched over to the ground. Patient was obtunded and non-verbal after that. She also started to have vomiting when EMS was there they gave her repeated doses of zofran. Patient apparently has been having these episodes frequently according to her son. Patient had some diaphoresis. At home she has these episodes and usually the son states that he watches her and then after half an hour or so she comes back to baseline. Now she is back to baseline with her dementia. Her son states that she had a similar episode this morning while she was in the bathroom. She has had most of the episodes when she is straining in the bathroom until today.  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 EEG was non specific. Neurology was consulted on admission and have followed patient while hospitalized. She was found to have an UTI which is  more likely to explain patient's symptoms, and was started empirically on Ciprofloxacin. Shortly after admission patient was beck to her normal self. Son asking for the patient to be discharged as with her dementia she is better off at home than in the hospital, PT evaluation was pending at that time and son did not want to wait until the next morning. Her 2D echo showed low normal EF to 45-50%, without WMA and grade 1 diastolic dysfunction. Her telemetry was without significant events other than PVCs. Dementia - close to baseline, continue home medications UTI - urine cultures pending, start Ceftriaxone. No history of multi drug resistant organisms and will be discharged home on empiric Cipro for 5 days.   Procedures:  2D echo   Consultations:  Neurology   Discharge Exam: Filed Vitals:   05/24/15 1057 05/24/15 1514 05/24/15 1600 05/24/15 1746  BP: 117/49 131/63  154/74  Pulse: 70 68  89  Temp: 98.2 F (36.8 C) 98.1 F (36.7 C)  98.6 F (37 C)  TempSrc: Axillary Oral  Oral  Resp: 115 18  18  Height:    (1.651 m)   Weight:   52.9 kg (116 lb 10 oz)   SpO2: 99% 99%  96%   General: NAD Cardiovascular: RRR Respiratory: CTA biL  Discharge Instructions Activity:  As tolerated   Get Medicines reviewed and adjusted: Please take all your medications with you for your next visit with your Primary MD  Please request your Primary MD to go  over all hospital tests and procedure/radiological results at the follow up, please ask your Primary MD to get all Hospital records sent to his/her office.  If you experience worsening of your admission symptoms, develop shortness of breath, life threatening emergency, suicidal or homicidal thoughts you must seek medical attention immediately by calling 911 or calling your MD immediately if symptoms less severe.  You must read complete instructions/literature along with all the possible adverse reactions/side effects for all the Medicines you  take and that have been prescribed to you. Take any new Medicines after you have completely understood and accpet all the possible adverse reactions/side effects.   Do not drive when taking Pain medications.   Do not take more than prescribed Pain, Sleep and Anxiety Medications  Special Instructions: If you have smoked or chewed Tobacco in the last 2 yrs please stop smoking, stop any regular Alcohol and or any Recreational drug use.  Wear Seat belts while driving.  Please note  You were cared for by a hospitalist during your hospital stay. Once you are discharged, your primary care physician will handle any further medical issues. Please note that NO REFILLS for any discharge medications will be authorized once you are discharged, as it is imperative that you return to your primary care physician (or establish a relationship with a primary care physician if you do not have one) for your aftercare needs so that they can reassess your need for medications and monitor your lab values.    Medication List    TAKE these medications        ciprofloxacin 500 MG tablet  Commonly known as:  CIPRO  Take 1 tablet (500 mg total) by mouth 2 (two) times daily.     docusate sodium 100 MG capsule  Commonly known as:  COLACE  Take 100 mg by mouth daily.     donepezil 10 MG tablet  Commonly known as:  ARICEPT  Take 10 mg by mouth daily.     memantine 5 MG tablet  Commonly known as:  NAMENDA  Take 5 mg by mouth daily.     Vitamin D3 2000 UNITS Tabs  Take 1 tablet by mouth daily.           Follow-up Information    Follow up with PCP. Schedule an appointment as soon as possible for a visit in 2 weeks.      The results of significant diagnostics from this hospitalization (including imaging, microbiology, ancillary and laboratory) are listed below for reference.    Significant Diagnostic Studies: Ct Head Wo Contrast  05/23/2015  CLINICAL DATA:  81 year old female with acute syncope and  altered mental status today. EXAM: CT HEAD WITHOUT CONTRAST TECHNIQUE: Contiguous axial images were obtained from the base of the skull through the vertex without intravenous contrast. COMPARISON:  12/06/2014 and prior head CTs FINDINGS: Moderate-severe atrophy and moderate chronic small-vessel white matter ischemic changes again noted. Remote left occipital infarct again noted. No acute intracranial abnormalities are identified, including mass lesion or mass effect, hydrocephalus, extra-axial fluid collection, midline shift, hemorrhage, or acute infarction. The visualized bony calvarium is unremarkable. IMPRESSION: No evidence of acute intracranial abnormality. Atrophy, chronic small-vessel white matter ischemic changes and remote left occipital infarct. Critical Value/emergent results were called by telephone at the time of interpretation on 05/23/2015 at 4:44 pm to Dr. Roseanne Reno, who verbally acknowledged these results. Electronically Signed   By: Harmon Pier M.D.   On: 05/23/2015 16:45   Mr Brain Wo Contrast  05/24/2015  ADDENDUM REPORT: 05/24/2015 14:50 ADDENDUM: Study discussed by telephone with Dr. Freda Munro on 05/24/2015 at 1420 hours. Electronically Signed   By: Odessa Fleming M.D.   On: 05/24/2015 14:50  05/24/2015  CLINICAL DATA:  81 year old female with syncope or seizure. Episode of unresponsiveness followed by vomiting. Initial encounter. EXAM: MRI HEAD WITHOUT CONTRAST MRA HEAD WITHOUT CONTRAST TECHNIQUE: Multiplanar, multiecho pulse sequences of the brain and surrounding structures were obtained without intravenous contrast. Angiographic images of the head were obtained using MRA technique without contrast. COMPARISON:  Head CT without contrast 05/23/2015. Brain MRI 08/31/2014. FINDINGS: MRI HEAD FINDINGS Major intracranial vascular flow voids are stable. Stable cerebral volume. No restricted diffusion to suggest acute infarction. No midline shift, mass effect, evidence of mass lesion, ventriculomegaly,  extra-axial collection or acute intracranial hemorrhage. Cervicomedullary junction and pituitary are within normal limits. Stable gray and white matter signal. Confluent, patchy bilateral cerebral white matter T2 and FLAIR hyperintensity. Chronic encephalomalacia in both occipital poles greater on the left. No chronic cerebral blood products. Chronic lacunar infarcts in both thalami. Brainstem and cerebellum are within normal limits for age. Visible internal auditory structures appear normal. Trace mastoid fluid on the left is stable. Decreased retained secretions in the nasopharynx. Stable mild paranasal sinus mucosal thickening. Orbit and scalp soft tissues are stable. MRA HEAD FINDINGS Antegrade flow in the posterior circulation. Fairly codominant distal vertebral arteries. PICA origins appear patent. Patent basilar artery with mild distal basilar stenosis. SCA and left PCA origins are within normal limits. The right PCA is occluded just beyond its origin. Posterior communicating arteries are diminutive or absent. Moderate to severe tandem stenoses in the left PCA P2 and P3 segments. Antegrade flow in both ICA siphons no siphon stenosis. Carotid termini are patent. Ophthalmic artery origins within normal limits. MCA and ACA origins within normal limits. Dominant right ACA A1 segment. Mild left A1 stenosis. Anterior communicating artery is within normal limits. Moderate tandem stenoses in the left ACA A2 and distal branches with preserved distal flow. MCA M1 segments and bifurcations are within normal limits. No MCA branch occlusion identified. IMPRESSION: 1. No acute intracranial abnormality. Chronic ischemic disease appears stable since February. 2. 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 Electronically Signed: By: Odessa Fleming M.D. On: 05/24/2015 14:06   Mr Maxine Glenn Head/brain Wo Cm  05/24/2015  ADDENDUM  REPORT: 05/24/2015 14:50 ADDENDUM: Study discussed by telephone with Dr. Freda Munro on 05/24/2015 at 1420 hours. Electronically Signed   By: Odessa Fleming M.D.   On: 05/24/2015 14:50  05/24/2015  CLINICAL DATA:  81 year old female with syncope or seizure. Episode of unresponsiveness followed by vomiting. Initial encounter. EXAM: MRI HEAD WITHOUT CONTRAST MRA HEAD WITHOUT CONTRAST TECHNIQUE: Multiplanar, multiecho pulse sequences of the brain and surrounding structures were obtained without intravenous contrast. Angiographic images of the head were obtained using MRA technique without contrast. COMPARISON:  Head CT without contrast 05/23/2015. Brain MRI 08/31/2014. FINDINGS: MRI HEAD FINDINGS Major intracranial vascular flow voids are stable. Stable cerebral volume. No restricted diffusion to suggest acute infarction. No midline shift, mass effect, evidence of mass lesion, ventriculomegaly, extra-axial collection or acute intracranial hemorrhage. Cervicomedullary junction and pituitary are within normal limits. Stable gray and white matter signal. Confluent, patchy bilateral cerebral white matter T2 and FLAIR hyperintensity. Chronic encephalomalacia in both occipital poles greater on the left. No chronic cerebral blood products. Chronic  lacunar infarcts in both thalami. Brainstem and cerebellum are within normal limits for age. Visible internal auditory structures appear normal. Trace mastoid fluid on the left is stable. Decreased retained secretions in the nasopharynx. Stable mild paranasal sinus mucosal thickening. Orbit and scalp soft tissues are stable. MRA HEAD FINDINGS Antegrade flow in the posterior circulation. Fairly codominant distal vertebral arteries. PICA origins appear patent. Patent basilar artery with mild distal basilar stenosis. SCA and left PCA origins are within normal limits. The right PCA is occluded just beyond its origin. Posterior communicating arteries are diminutive or absent. Moderate to severe  tandem stenoses in the left PCA P2 and P3 segments. Antegrade flow in both ICA siphons no siphon stenosis. Carotid termini are patent. Ophthalmic artery origins within normal limits. MCA and ACA origins within normal limits. Dominant right ACA A1 segment. Mild left A1 stenosis. Anterior communicating artery is within normal limits. Moderate tandem stenoses in the left ACA A2 and distal branches with preserved distal flow. MCA M1 segments and bifurcations are within normal limits. No MCA branch occlusion identified. IMPRESSION: 1. No acute intracranial abnormality. Chronic ischemic disease appears stable since February. 2. 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 Electronically Signed: By: Odessa FlemingH  Hall M.D. On: 05/24/2015 14:06   Labs: Basic Metabolic Panel:  Recent Labs Lab 05/23/15 1618 05/23/15 1621  NA 140 143  K 3.2* 3.1*  CL 105 103  CO2 21*  --   GLUCOSE 146* 141*  BUN 14 16  CREATININE 0.82 0.80  CALCIUM 9.3  --    Liver Function Tests:  Recent Labs Lab 05/23/15 1618  AST 28  ALT 17  ALKPHOS 62  BILITOT 0.9  PROT 6.9  ALBUMIN 3.7   CBC:  Recent Labs Lab 05/23/15 1618 05/23/15 1621  WBC 9.9  --   NEUTROABS 6.2  --   HGB 14.5 15.3*  HCT 41.8 45.0  MCV 88.2  --   PLT 263  --    Cardiac Enzymes:  Recent Labs Lab 05/23/15 2225  TROPONINI <0.03   CBG:  Recent Labs Lab 05/24/15 0632 05/24/15 1140 05/24/15 1624  GLUCAP 113* 84 87       Signed:  Norma Ignasiak  [facility] 05/24/2015, 6:31 PM

## 2015-05-24 NOTE — Procedures (Signed)
ELECTROENCEPHALOGRAM REPORT  Date of Study: 05/24/2015  Patient's Name: [name] MRN: [id number] Date of Birth: 1920/11/16  Referring Provider: Dr. Freda MunroSaadat Khan  Clinical History: This is a 81 year old woman with an episode of unresponsiveness, obtunded and non-verbal.  Medications: aspirin tablet 325 mg cefTRIAXone (ROCEPHIN) 1 g in dextrose 5 % 50 mL IVPB docusate sodium (COLACE) capsule 100 mg donepezil (ARICEPT) tablet 10 mg insulin aspart (novoLOG) injection 0-15 Units memantine (NAMENDA) tablet 5 mg senna-docusate (Senokot-S) tablet 1 tablet  Technical Summary: A multichannel digital EEG recording measured by the international 10-20 system with electrodes applied with paste and impedances below 5000 ohms performed as portable with EKG monitoring in an awake and asleep patient.  Hyperventilation and photic stimulation were not performed.  The digital EEG was referentially recorded, reformatted, and digitally filtered in a variety of bipolar and referential montages for optimal display.   Description: The patient is awake and asleep during the recording.  During maximal wakefulness, there is a symmetric, medium voltage 5.5 Hz posterior dominant rhythm that attenuates with eye opening. This is admixed with a moderate amount of diffuse 4-5 Hz theta slowing of the waking background.  During drowsiness and sleep, there is an increase in theta and delta slowing of the background.  Vertex waves and symmetric sleep spindles were seen.  Hyperventilation and photic stimulation were not performed.  There were no epileptiform discharges or electrographic seizures seen.    EKG lead was unremarkable.  Impression: This awake and asleep EEG is abnormal due to the presence of: 1. Slowing of the posterior dominant rhythm 2. Mild diffuse slowing of the waking background  Clinical Correlation of the above findings indicates diffuse cerebral dysfunction that is non-specific in etiology and  can be seen with hypoxic/ischemic injury, toxic/metabolic encephalopathies, neurodegenerative disorders, medication effect, or from excessive drowsiness.  The absence of epileptiform discharges does not rule out a clinical diagnosis of epilepsy.  Clinical correlation is advised.   Patrcia DollyKaren Jalessa Peyser, M.D.

## 2015-05-24 NOTE — Discharge Instructions (Signed)
Follow with PCP in 5-7 days ° °Please get a complete blood count and chemistry panel checked by your Primary MD at your next visit, and again as instructed by your Primary MD. Please get your medications reviewed and adjusted by your Primary MD. ° °Please request your Primary MD to go over all Hospital Tests and Procedure/Radiological results at the follow up, please get all Hospital records sent to your Prim MD by signing hospital release before you go home. ° °If you had Pneumonia of Lung problems at the Hospital: °Please get a 2 view Chest X ray done in 6-8 weeks after hospital discharge or sooner if instructed by your Primary MD. ° °If you have Congestive Heart Failure: °Please call your Cardiologist or Primary MD anytime you have any of the following symptoms:  °1) 3 pound weight gain in 24 hours or 5 pounds in 1 week  °2) shortness of breath, with or without a dry hacking cough  °3) swelling in the hands, feet or stomach  °4) if you have to sleep on extra pillows at night in order to breathe ° °Follow cardiac low salt diet and 1.5 lit/day fluid restriction. ° °If you have diabetes °Accuchecks 4 times/day, Once in AM empty stomach and then before each meal. °Log in all results and show them to your primary doctor at your next visit. °If any glucose reading is under 80 or above 300 call your primary MD immediately. ° °If you have Seizure/Convulsions/Epilepsy: °Please do not drive, operate heavy machinery, participate in activities at heights or participate in high speed sports until you have seen by Primary MD or a Neurologist and advised to do so again. ° °If you had Gastrointestinal Bleeding: °Please ask your Primary MD to check a complete blood count within one week of discharge or at your next visit. Your endoscopic/colonoscopic biopsies that are pending at the time of discharge, will also need to followed by your Primary MD. ° °Get Medicines reviewed and adjusted. °Please take all your medications with you  for your next visit with your Primary MD ° °Please request your Primary MD to go over all hospital tests and procedure/radiological results at the follow up, please ask your Primary MD to get all Hospital records sent to his/her office. ° °If you experience worsening of your admission symptoms, develop shortness of breath, life threatening emergency, suicidal or homicidal thoughts you must seek medical attention immediately by calling 911 or calling your MD immediately  if symptoms less severe. ° °You must read complete instructions/literature along with all the possible adverse reactions/side effects for all the Medicines you take and that have been prescribed to you. Take any new Medicines after you have completely understood and accpet all the possible adverse reactions/side effects.  ° °Do not drive or operate heavy machinery when taking Pain medications.  ° °Do not take more than prescribed Pain, Sleep and Anxiety Medications ° °Special Instructions: If you have smoked or chewed Tobacco  in the last 2 yrs please stop smoking, stop any regular Alcohol  and or any Recreational drug use. ° °Wear Seat belts while driving. ° °Please note °You were cared for by a hospitalist during your hospital stay. If you have any questions about your discharge medications or the care you received while you were in the hospital after you are discharged, you can call the unit and asked to speak with the hospitalist on call if the hospitalist that took care of you is not available. Once you are   discharged, your primary care physician will handle any further medical issues. Please note that NO REFILLS for any discharge medications will be authorized once you are discharged, as it is imperative that you return to your primary care physician (or establish a relationship with a primary care physician if you do not have one) for your aftercare needs so that they can reassess your need for medications and monitor your lab values. ° °You  can reach the hospitalist office at phone [phone] or fax [phone] °  °If you do not have a primary care physician, you can call [phone] for a physician referral. ° °Activity: As tolerated with Full fall precautions use walker/cane & assistance as needed ° °Diet: as tolerated ° °Disposition Home ° ° °

## 2015-05-24 NOTE — Progress Notes (Addendum)
Subjective: patient was asleep but wakes easily, follows only simple commands. When flashing light in her eyes she states "you like to play games little boy".  Smiles at me and withdraws from pain equally.  Exam: Filed Vitals:   05/24/15 1057  BP: 117/49  Pulse: 70  Temp: 98.2 F (36.8 C)  Resp: 115    HEENT-  Normocephalic, no lesions, without obvious abnormality.  Normal external eye and conjunctiva.  Normal TM's bilaterally.  Normal auditory canals and external ears. Normal external nose, mucus membranes and septum.  Normal pharynx. Cardiovascular- S1, S2 normal, pulses palpable throughout   Lungs- no tachypnea, retractions or cyanosis Abdomen- normal findings: bowel sounds normal Extremities- no edema     Gen: In bed, NAD MS: alert, follows simple commands, known dementia.  CN: PERRLA, EOMI, TML, face symmetric Motor: MAEW Sensory: intact throughout   Pertinent Labs: Positive nitrites and leukocytes  Felicie MornDavid Smith PA-C [facility] [phone]  Impression:  Syncope and AMS in setting of UTI in a patient.  Exam is non-focal and patient has returned to baseline. TIA/CVA and EEG less likely but will obtain EEG and MRI.   Recommendations: 1) EEG and MRI--if negative neurology will S/O 2) Treat underlying UTI  05/24/2015, 11:28 AM   I discussed with the son regarding her events. Note is difficult to say with absolute certainty that they're not seizures, description sounds more like syncope, possibly vagal. I discussed with him the option of an empiric antiepileptic trial, but given lack of clear seizure semiology and possibility of side effects, we decided against pursuing that at this time. If events continue, then this could be a consideration in the future though with the understanding that would be done without the clear diagnosis of epilepsy. Given that there've been multiple events, I do not think that the occluded PCA without infarction has any clinical  relevance at current time.  No further recommendations at this time. Neurology will sign off.  Ritta SlotMcNeill Hommer Cunliffe, MD [facility] [phone]  If 7pm- 7am, please page neurology on call as listed in AMION.

## 2015-05-24 NOTE — Progress Notes (Signed)
Pt arrived to 5M15 from [facility]. Pt's son is at the bedside. Pt alert and oriented to self only which is patient's baseline (per patient's son).  Skin intact, vitals stable;pt placed on telemetry. Will continue to monitor.

## 2015-05-24 NOTE — Care Management Note (Signed)
Case Management Note  Patient Details  Name: [name] MRN: [id number] Date of Birth: 05-17-1921  Subjective/Objective:                    Action/Plan: Patient was admitted with possible syncope, UTI.  Lives at home with children.  CM met with patient's son at bedside, who states that patient has a [facility] LPN through her [place] that visits once yearly for an assessment. They also have an LPN that helps provide basic care in the home. Son is patient's caretaker.  CM will continue to follow for discharge needs pending PT/OT evals and physician orders.  Expected Discharge Date:                  Expected Discharge Plan:  [place]  In-House Referral:     Discharge planning Services     Post Acute Care Choice:    Choice offered to:  Adult Children  DME Arranged:    DME Agency:     HH Arranged:    HH Agency:     Status of Service:  In process, will continue to follow  Medicare Important Message Given:    Date Medicare IM Given:    Medicare IM give by:    Date Additional Medicare IM Given:    Additional Medicare Important Message give by:     If discussed at [place] of Stay Meetings, dates discussed:    Additional Comments:  Rolm Baptise, RN 05/24/2015, 11:16 AM

## 2015-05-24 NOTE — Progress Notes (Signed)
*  PRELIMINARY RESULTS* Vascular Ultrasound Carotid Duplex (Doppler) has been completed.  Preliminary findings: Bilateral: No significant (1-39%) ICA stenosis. Antegrade vertebral flow.    Farrel DemarkJill Eunice, RDMS, RVT  05/24/2015, 3:51 PM

## 2015-05-24 NOTE — Progress Notes (Signed)
EEG Completed; Results Pending  

## 2015-05-25 LAB — HEMOGLOBIN A1C
HEMOGLOBIN A1C: 5.3 % (ref 4.8–5.6)
Hgb A1c MFr Bld: 5.3 % (ref 4.8–5.6)
MEAN PLASMA GLUCOSE: 105 mg/dL
Mean Plasma Glucose: 105 mg/dL

## 2015-11-25 ENCOUNTER — Inpatient Hospital Stay (HOSPITAL_COMMUNITY): Payer: Medicare Other

## 2015-11-25 ENCOUNTER — Inpatient Hospital Stay (HOSPITAL_COMMUNITY)
Admission: EM | Admit: 2015-11-25 | Discharge: 2015-11-30 | DRG: 871 | Disposition: A | Discharge status: Home Health | Payer: Medicare Other | Attending: Internal Medicine | Admitting: Internal Medicine

## 2015-11-25 ENCOUNTER — Encounter (HOSPITAL_COMMUNITY): Payer: Self-pay | Admitting: *Deleted

## 2015-11-25 ENCOUNTER — Emergency Department (HOSPITAL_COMMUNITY): Payer: Medicare Other

## 2015-11-25 DIAGNOSIS — R6520 Severe sepsis without septic shock: Secondary | ICD-10-CM

## 2015-11-25 DIAGNOSIS — A419 Sepsis, unspecified organism: Secondary | ICD-10-CM

## 2015-11-25 DIAGNOSIS — R6521 Severe sepsis with septic shock: Secondary | ICD-10-CM | POA: Diagnosis present

## 2015-11-25 DIAGNOSIS — R112 Nausea with vomiting, unspecified: Secondary | ICD-10-CM

## 2015-11-25 DIAGNOSIS — D696 Thrombocytopenia, unspecified: Secondary | ICD-10-CM | POA: Diagnosis present

## 2015-11-25 DIAGNOSIS — Z515 Encounter for palliative care: Secondary | ICD-10-CM | POA: Diagnosis not present

## 2015-11-25 DIAGNOSIS — Z66 Do not resuscitate: Secondary | ICD-10-CM | POA: Diagnosis present

## 2015-11-25 DIAGNOSIS — G9340 Encephalopathy, unspecified: Secondary | ICD-10-CM | POA: Diagnosis not present

## 2015-11-25 DIAGNOSIS — Z681 Body mass index (BMI) 19 or less, adult: Secondary | ICD-10-CM | POA: Diagnosis not present

## 2015-11-25 DIAGNOSIS — A4159 Other Gram-negative sepsis: Secondary | ICD-10-CM | POA: Diagnosis present

## 2015-11-25 DIAGNOSIS — N390 Urinary tract infection, site not specified: Secondary | ICD-10-CM | POA: Diagnosis present

## 2015-11-25 DIAGNOSIS — G309 Alzheimer's disease, unspecified: Secondary | ICD-10-CM | POA: Diagnosis present

## 2015-11-25 DIAGNOSIS — R627 Adult failure to thrive: Secondary | ICD-10-CM | POA: Diagnosis present

## 2015-11-25 DIAGNOSIS — R609 Edema, unspecified: Secondary | ICD-10-CM | POA: Diagnosis not present

## 2015-11-25 DIAGNOSIS — Z79899 Other long term (current) drug therapy: Secondary | ICD-10-CM

## 2015-11-25 DIAGNOSIS — R739 Hyperglycemia, unspecified: Secondary | ICD-10-CM | POA: Diagnosis present

## 2015-11-25 DIAGNOSIS — R338 Other retention of urine: Secondary | ICD-10-CM | POA: Diagnosis not present

## 2015-11-25 DIAGNOSIS — R509 Fever, unspecified: Secondary | ICD-10-CM | POA: Diagnosis not present

## 2015-11-25 DIAGNOSIS — I5042 Chronic combined systolic (congestive) and diastolic (congestive) heart failure: Secondary | ICD-10-CM | POA: Diagnosis present

## 2015-11-25 DIAGNOSIS — K5900 Constipation, unspecified: Secondary | ICD-10-CM | POA: Diagnosis present

## 2015-11-25 DIAGNOSIS — I82619 Acute embolism and thrombosis of superficial veins of unspecified upper extremity: Secondary | ICD-10-CM | POA: Diagnosis not present

## 2015-11-25 DIAGNOSIS — E876 Hypokalemia: Secondary | ICD-10-CM | POA: Diagnosis present

## 2015-11-25 DIAGNOSIS — T17908A Unspecified foreign body in respiratory tract, part unspecified causing other injury, initial encounter: Secondary | ICD-10-CM

## 2015-11-25 DIAGNOSIS — N3000 Acute cystitis without hematuria: Secondary | ICD-10-CM | POA: Diagnosis not present

## 2015-11-25 DIAGNOSIS — F0280 Dementia in other diseases classified elsewhere without behavioral disturbance: Secondary | ICD-10-CM | POA: Diagnosis present

## 2015-11-25 DIAGNOSIS — R64 Cachexia: Secondary | ICD-10-CM | POA: Diagnosis present

## 2015-11-25 DIAGNOSIS — E872 Acidosis, unspecified: Secondary | ICD-10-CM

## 2015-11-25 DIAGNOSIS — Z8249 Family history of ischemic heart disease and other diseases of the circulatory system: Secondary | ICD-10-CM

## 2015-11-25 DIAGNOSIS — N179 Acute kidney failure, unspecified: Secondary | ICD-10-CM | POA: Diagnosis not present

## 2015-11-25 DIAGNOSIS — E870 Hyperosmolality and hypernatremia: Secondary | ICD-10-CM | POA: Diagnosis present

## 2015-11-25 DIAGNOSIS — D638 Anemia in other chronic diseases classified elsewhere: Secondary | ICD-10-CM | POA: Diagnosis present

## 2015-11-25 DIAGNOSIS — R1310 Dysphagia, unspecified: Secondary | ICD-10-CM | POA: Diagnosis present

## 2015-11-25 LAB — COMPREHENSIVE METABOLIC PANEL WITH GFR
ALT: 16 U/L (ref 14–54)
AST: 46 U/L — ABNORMAL HIGH (ref 15–41)
Albumin: 3.4 g/dL — ABNORMAL LOW (ref 3.5–5.0)
Alkaline Phosphatase: 79 U/L (ref 38–126)
Anion gap: 24 — ABNORMAL HIGH (ref 5–15)
BUN: 23 mg/dL — ABNORMAL HIGH (ref 6–20)
CO2: 18 mmol/L — ABNORMAL LOW (ref 22–32)
Calcium: 9.6 mg/dL (ref 8.9–10.3)
Chloride: 105 mmol/L (ref 101–111)
Creatinine, Ser: 1.88 mg/dL — ABNORMAL HIGH (ref 0.44–1.00)
GFR calc Af Amer: 25 mL/min — ABNORMAL LOW (ref >60)
GFR calc non Af Amer: 22 mL/min — ABNORMAL LOW (ref >60)
Glucose, Bld: 223 mg/dL — ABNORMAL HIGH (ref 65–99)
Potassium: 3.5 mmol/L (ref 3.5–5.1)
Sodium: 147 mmol/L — ABNORMAL HIGH (ref 135–145)
Total Bilirubin: 1.2 mg/dL (ref 0.3–1.2)
Total Protein: 7.1 g/dL (ref 6.5–8.1)

## 2015-11-25 LAB — CBC
HCT: 50 % — ABNORMAL HIGH (ref 36.0–46.0)
Hemoglobin: 16.3 g/dL — ABNORMAL HIGH (ref 12.0–15.0)
MCH: 30 pg (ref 26.0–34.0)
MCHC: 32.6 g/dL (ref 30.0–36.0)
MCV: 92.1 fL (ref 78.0–100.0)
Platelets: 204 K/uL (ref 150–400)
RBC: 5.43 MIL/uL — ABNORMAL HIGH (ref 3.87–5.11)
RDW: 13.8 % (ref 11.5–15.5)
WBC: 15.2 K/uL — ABNORMAL HIGH (ref 4.0–10.5)

## 2015-11-25 LAB — I-STAT CG4 LACTIC ACID, ED
Lactic Acid, Venous: 12.01 mmol/L (ref 0.5–2.0)
Lactic Acid, Venous: 9.03 mmol/L (ref 0.5–2.0)

## 2015-11-25 LAB — URINALYSIS, ROUTINE W REFLEX MICROSCOPIC
Bilirubin Urine: NEGATIVE
GLUCOSE, UA: NEGATIVE mg/dL
Ketones, ur: 15 mg/dL — AB
Nitrite: POSITIVE — AB
PROTEIN: 100 mg/dL — AB
SPECIFIC GRAVITY, URINE: 1.013 (ref 1.005–1.030)
pH: 8.5 — ABNORMAL HIGH (ref 5.0–8.0)

## 2015-11-25 LAB — I-STAT TROPONIN, ED: Troponin i, poc: 0.96 ng/mL (ref 0.00–0.08)

## 2015-11-25 LAB — APTT: APTT: 38 s — AB (ref 24–37)

## 2015-11-25 LAB — URINE MICROSCOPIC-ADD ON

## 2015-11-25 LAB — MRSA PCR SCREENING: MRSA by PCR: NEGATIVE

## 2015-11-25 LAB — LACTIC ACID, PLASMA: Lactic Acid, Venous: 7.2 mmol/L (ref 0.5–2.0)

## 2015-11-25 LAB — PROCALCITONIN: Procalcitonin: 22.3 ng/mL

## 2015-11-25 MED ORDER — ENOXAPARIN SODIUM 40 MG/0.4ML ~~LOC~~ SOLN: 40.0000 mg | SUBCUTANEOUS | Status: DC

## 2015-11-25 MED ORDER — ACETAMINOPHEN 650 MG RE SUPP: 650.0000 mg | Freq: Four times a day (QID) | RECTAL | Status: DC | PRN

## 2015-11-25 MED ORDER — DEXTROSE 5 % IV SOLN
1.0000 g | INTRAVENOUS | Status: DC
  Filled 2015-11-25: qty 10

## 2015-11-25 MED ORDER — SODIUM CHLORIDE 0.9 % IV SOLN: INTRAVENOUS | Status: AC

## 2015-11-25 MED ORDER — ONDANSETRON HCL 4 MG/2ML IJ SOLN: 4.0000 mg | Freq: Four times a day (QID) | INTRAMUSCULAR | Status: DC | PRN

## 2015-11-25 MED ORDER — SODIUM CHLORIDE 0.9 % IV SOLN
1000.0000 mL | Freq: Once | INTRAVENOUS | Status: AC
  Administered 2015-11-25: 1000 mL via INTRAVENOUS

## 2015-11-25 MED ORDER — SODIUM CHLORIDE 0.9 % IV BOLUS (SEPSIS)
1000.0000 mL | Freq: Once | INTRAVENOUS | Status: AC
  Administered 2015-11-25: 1000 mL via INTRAVENOUS

## 2015-11-25 MED ORDER — SODIUM CHLORIDE 0.9 % IV BOLUS (SEPSIS)
250.0000 mL | Freq: Once | INTRAVENOUS | Status: AC
  Administered 2015-11-25: 250 mL via INTRAVENOUS

## 2015-11-25 MED ORDER — DEXTROSE 5 % IV SOLN
1.0000 g | Freq: Once | INTRAVENOUS | Status: AC
  Administered 2015-11-25: 1 g via INTRAVENOUS
  Filled 2015-11-25: qty 10

## 2015-11-25 MED ORDER — DONEPEZIL HCL 10 MG PO TABS
10.0000 mg | ORAL_TABLET | Freq: Every day | ORAL | Status: DC
  Administered 2015-11-25 – 2015-11-29 (×4): 10 mg via ORAL
  Filled 2015-11-25 (×6): qty 1

## 2015-11-25 MED ORDER — MEMANTINE HCL 10 MG PO TABS
5.0000 mg | ORAL_TABLET | Freq: Every day | ORAL | Status: DC
  Administered 2015-11-25 – 2015-11-29 (×4): 5 mg via ORAL
  Filled 2015-11-25 (×4): qty 1

## 2015-11-25 MED ORDER — DOCUSATE SODIUM 100 MG PO CAPS
100.0000 mg | ORAL_CAPSULE | Freq: Every day | ORAL | Status: DC
  Administered 2015-11-25 – 2015-11-29 (×3): 100 mg via ORAL
  Filled 2015-11-25 (×3): qty 1

## 2015-11-25 MED ORDER — ACETAMINOPHEN 650 MG RE SUPP
650.0000 mg | Freq: Once | RECTAL | Status: AC
  Administered 2015-11-25: 650 mg via RECTAL
  Filled 2015-11-25: qty 1

## 2015-11-25 MED ORDER — ENOXAPARIN SODIUM 30 MG/0.3ML ~~LOC~~ SOLN
30.0000 mg | SUBCUTANEOUS | Status: DC
  Administered 2015-11-26: 30 mg via SUBCUTANEOUS
  Filled 2015-11-25 (×2): qty 0.3

## 2015-11-25 MED ORDER — SODIUM CHLORIDE 0.9 % IV BOLUS (SEPSIS)
500.0000 mL | Freq: Once | INTRAVENOUS | Status: AC
  Administered 2015-11-25: 500 mL via INTRAVENOUS

## 2015-11-25 MED ORDER — SODIUM CHLORIDE 0.9 % IV SOLN
INTRAVENOUS | Status: AC
  Administered 2015-11-25: 20:00:00 via INTRAVENOUS

## 2015-11-25 MED ORDER — ONDANSETRON HCL 4 MG PO TABS: 4.0000 mg | ORAL_TABLET | Freq: Four times a day (QID) | ORAL | Status: DC | PRN

## 2015-11-25 MED ORDER — VITAMIN D3 25 MCG (1000 UNIT) PO TABS
1000.0000 [IU] | ORAL_TABLET | Freq: Every day | ORAL | Status: DC
  Administered 2015-11-25 – 2015-11-29 (×4): 1000 [IU] via ORAL
  Filled 2015-11-25 (×9): qty 1

## 2015-11-25 MED ORDER — ACETAMINOPHEN 325 MG PO TABS: 650.0000 mg | ORAL_TABLET | Freq: Four times a day (QID) | ORAL | Status: DC | PRN

## 2015-11-25 NOTE — Consult Note (Signed)
PULMONARY / CRITICAL CARE MEDICINE   Name: [name] MRN: [id number] DOB: 05-May-1921    ADMISSION DATE:  11/25/2015 CONSULTATION DATE:  11/25/2015  REFERRING MD:  EDP  CHIEF COMPLAINT: AMS  HISTORY OF PRESENT ILLNESS:   82 year old female past medical history as below, which is significant for Alzheimer's dementia. At baseline, she lives with her son is her caretaker. She spends most of her day in bed or on the couch and is able to use walker with assistance of her son. She will intermittently recognize him and is moderately verbal. She was in her usual state of health until about last Thursday 5/4 when she began complaining of intermittent abdominal pain. Son also reports that she would "grab at" her perineum frequently. These symptoms continued through the weekend until today 5/8 when she had one episode of vomiting and shortly after became minimally responsive. Her son called EMS and she was transferred to [facility] emergency department where she was noted to be tachycardic and hypotensive with systolic blood pressures in the 70s. She is provided with 2 L of IV fluid and blood pressures improved to systolic in the 90s to low 100s. Lactic acid resulted at 12. Urinalysis was suspicious for urinary tract infection she was started on empiric Rocephin. Cultures were obtained and sent. Due to hypotension and elevated lactic acid and critical care medicine has been asked to evaluate for admission. The patient's son states that when he called EMS mother initially had altered mental status and was not able to follow commands. He currently believes that her mental status has returned to baseline.  PAST MEDICAL HISTORY :  She  has a past medical history of Dementia and Arthritis.  PAST SURGICAL HISTORY: She  has no past surgical history on file.  No Known Allergies  No current facility-administered medications on file prior to encounter.   Current Outpatient Prescriptions on File Prior to  Encounter  Medication Sig  . docusate sodium (COLACE) 100 MG capsule Take 100 mg by mouth daily at 3 pm.   . donepezil (ARICEPT) 10 MG tablet Take 10 mg by mouth daily at 3 pm.   . memantine (NAMENDA) 5 MG tablet Take 5 mg by mouth daily at 3 pm.     FAMILY HISTORY:  Her has no family status information on file.   SOCIAL HISTORY: She  reports that she has never smoked. She does not have any smokeless tobacco history on file. She reports that she does not drink alcohol.  REVIEW OF SYSTEMS:   Unable due to encephalopathy  SUBJECTIVE:    VITAL SIGNS: BP 96/63 mmHg  Pulse 111  Temp(Src) 102.4 F (39.1 C) (Rectal)  Resp 32  Ht  (1.676 m)  Wt 49.896 kg (110 lb)  BMI 17.76 kg/m2  SpO2 99%  HEMODYNAMICS:    VENTILATOR SETTINGS:    INTAKE / OUTPUT:    PHYSICAL EXAMINATION: General:  Elderly cachectic female in NAD Neuro:  Follows simple commands, non-verbal, PERRL, at baseline per son HEENT:  [place]/AT, no JVD Cardiovascular:  Tachy (108), regular, no MRG Lungs:  Clear bilateral breath sounds Abdomen:  Soft, non-tender, non-distended Musculoskeletal:  No acute deformity or ROM limitaiton Skin:  Grossly intact, MM appear dry, skin turgor tenting over clavicle and dorsal surface of hands. No lower extremity edema.   LABS:  BMET  Recent Labs Lab 11/25/15 1355  NA 147*  K 3.5  CL 105  CO2 18*  BUN 23*  CREATININE 1.88*  GLUCOSE  223*    Electrolytes  Recent Labs Lab 11/25/15 1355  CALCIUM 9.6    CBC  Recent Labs Lab 11/25/15 1355  WBC 15.2*  HGB 16.3*  HCT 50.0*  PLT 204    Coag's No results for input(s): APTT, INR in the last 168 hours.  Sepsis Markers  Recent Labs Lab 11/25/15 1556 11/25/15 1735  LATICACIDVEN 12.01* 9.03*    ABG No results for input(s): PHART, PCO2ART, PO2ART in the last 168 hours.  Liver Enzymes  Recent Labs Lab 11/25/15 1355  AST 46*  ALT 16  ALKPHOS 79  BILITOT 1.2  ALBUMIN 3.4*    Cardiac  Enzymes No results for input(s): TROPONINI, PROBNP in the last 168 hours.  Glucose No results for input(s): GLUCAP in the last 168 hours.  Imaging Dg Chest 2 View  11/25/2015  CLINICAL DATA:  Vomiting today. Question aspiration. Initial encounter. EXAM: CHEST  2 VIEW COMPARISON:  Single-view of the chest 12/06/2014 and 08/31/2014. FINDINGS: The lungs are clear. Heart size is normal. No pneumothorax or pleural effusion. No focal bony abnormality. Aortic atherosclerosis noted. IMPRESSION: Negative for aspiration.  No acute disease. Electronically Signed   By: Drusilla Kannerhomas  Dalessio M.D.   On: 11/25/2015 14:22     STUDIES:    CULTURES: Blood 5/8 >>> Urine 5/8 >>>  ANTIBIOTICS: Ceftriaxone 5/8 >  SIGNIFICANT EVENTS: 5/8 admit  LINES/TUBES:   DISCUSSION: 82 year old female with PMH dementia. Presenting with several days poorly described abd pain, hypotension, AMS. Found to have UTI on UA. BP responded to volume. DNR/DNI per son. Ok pressors if needed.   ASSESSMENT / PLAN:  PULMONARY A: Tachypnea > compensating for lactic acidosis  P:   Monitor Keep O2 sats > 92%, supplemental O2 PRN DNI  CARDIOVASCULAR A:  Hypotension related to severe sepsis and hypovolemia Tachycardia likely stress response/hypovolemia Chronic systolic/diastolic CHF (LVEF 45-50%, grade 1 DD 05/2015) Troponin elevation, no ischemia on EKG  P:  Telemetry montoring 30cc/kg received  Continue more gentle IVF resuscitation DNR Trend lactic acid If lactic worsens or unable to keep MAP > 65 will need ICU transfer and CVL/pressors. Cycle CE's  RENAL A:   Acute kidney injury > suspect pre-renal ATN High AG acidosis (lactic)  P:   Follow BMP Gentle hydration Consider renal US to rule out hydronephrosis with prolonged UTI symptoms.   GASTROINTESTINAL A:   Vomiting  P:   Per primary  HEMATOLOGIC A:   Hemoconcentration  P:  Trend CBC  INFECTIOUS A:   Severe sepsis secondary to urinary  tract infection.  P:   ABX per primary Follow cultures  ENDOCRINE A:   Hyperglycemia without history of DM  P:   Per primary  NEUROLOGIC A:   Acute encephalopathy > resolved per son Alzheimer's dementia, advanced.  P:   RASS goal: 0    FAMILY  - Updates: [facility]  - Inter-disciplinary family meet or Palliative Care meeting due by:  5/15   Joneen RoachPaul Hoffman, AGACNP-BC [facility] Pager [phone] or [phone]  11/25/2015 6:18 PM   Attending Note:  I have examined patient, reviewed labs, studies and notes. I have discussed the case with Henreitta LeberP Hoffman, and I agree with the data and plans as amended above. 82 yo woman admitted with a decrease in MS from baseline, severe sepsis / occult septic shock with associated lactic acidosis. Source appears to be UTI. On my evaluation (following abx and volume administration) she is comfortable, normotensive,  normal HR. Denies pain and will interact - her son indicates close to baseline. Lactate is clearing, most recent 6.2. I favor continued volume administration through the night tonight, abx as ordered. Suspect she will be able to avoid pressors. Will follow to insure she remains stable, would move her to ICU for CVC and pressors if she declined. Note otherwise DNR/I. Independent critical care time is 35 minutes.   Levy Pupa, MD, PhD 11/26/2015, 12:42 AM [facility] [phone] or if no answer [phone]

## 2015-11-25 NOTE — Progress Notes (Signed)
Pharmacy Antibiotic Note  [name] is a 82 y.o. female admitted on 11/25/2015 with sepsis secondary to UTI.  Pharmacy has been consulted for ceftriaxone dosing. Give 1 time dose in the ED today. Febrile up to 102.4, WBC elevated at 15.2.  Plan: Continue ceftriaxone 1g IV Q24 Monitor clinical picture F/U C&S, abx deescalation / LOT   Height: 5\' 6"  (167.6 cm) Weight: 110 lb (49.896 kg) IBW/kg (Calculated) : 59.3  Temp (24hrs), Avg:101.6 F (38.7 C), Min:100.7 F (38.2 C), Max:102.4 F (39.1 C)   Recent Labs Lab 11/25/15 1355 11/25/15 1556 11/25/15 1735  WBC 15.2*  --   --   CREATININE 1.88*  --   --   LATICACIDVEN  --  12.01* 9.03*    Estimated Creatinine Clearance: 14.7 mL/min (by C-G formula based on Cr of 1.88).    No Known Allergies  Antimicrobials this admission: Ceftriaxone 5/8 >>   Microbiology results: 5/8 BCx: sent 5/8 UCx: sent  5/8 MRSA PCR: sent  Thank you for allowing pharmacy to be a part of this patient's care.  Armandina StammerBATCHELDER,Zayleigh Stroh J 11/25/2015 8:24 PM

## 2015-11-25 NOTE — Progress Notes (Signed)
CRITICAL VALUE ALERT  Critical value received:  Lactic Acid 7.2  Date of notification:  11/25/2015   Time of notification:  10:22 PM   Critical value read back:Yes.    Nurse who received alert:  Cresenciano LickMikaela Torryn Fiske, RN   MD notified (1st page):  Craige CottaKirby, NP   Time of first page:  10:22 PM   MD notified (2nd page):  Time of second page:  Responding MD:  Craige CottaKirby, NP   Time MD responded:  10:22 PM

## 2015-11-25 NOTE — H&P (Signed)
History and Physical    [name] JXB:[id number] DOB: June 10, 1921 DOA: 11/25/2015  Referring MD/NP/PA: Dr.Nanavati. PCP: Pcp Not In System  Outpatient Specialists: None. Patient coming from: Home.  Chief Complaint: Altered mental status.  HPI: [name] is a 82 y.o. female with medical history significant of dementia was brought to the ER after patient became less responsive. As per patient's son who provided the history patient has been having some abdominal discomfort over the last 3-4 days. Patient started developing some fever and chills and today morning had some nausea vomiting. Following which patient became more lethargic and less responsive and patient was brought to the ER. In the ER patient was found to be hypotensive and febrile with labs showing markedly elevated lactic acid at 12. UA shows features consistent with UTI. Abdomen on exam was benign. Chest x-ray was unremarkable. Critical care was consulted and at this time patient's son is confirmed patient's DO NOT RESUSCITATE status but was okay with vasopressors if required. By the time I examined patient has become more alert and awake but still does not follow command. Patient has been admitted for sepsis secondary UTI.   ED Course: IV fluids antibiotics critical care consult.  Review of Systems: As per HPI otherwise 10 point review of systems negative.    Past Medical History  Diagnosis Date  . Dementia   . Arthritis     Past Surgical History  Procedure Laterality Date  . Abdominal hysterectomy       reports that she has never smoked. She does not have any smokeless tobacco history on file. She reports that she does not drink alcohol. Her drug history is not on file.  No Known Allergies  Family History  Problem Relation Age of Onset  . Hypertension Mother   . Hypertension Father     Prior to Admission medications   Medication Sig Start Date End Date Taking? Authorizing Provider    cholecalciferol (VITAMIN D) 1000 units tablet Take 1,000 Units by mouth daily at 3 pm.   Yes Historical Provider, MD  docusate sodium (COLACE) 100 MG capsule Take 100 mg by mouth daily at 3 pm.    Yes Historical Provider, MD  donepezil (ARICEPT) 10 MG tablet Take 10 mg by mouth daily at 3 pm.    Yes Historical Provider, MD  memantine (NAMENDA) 5 MG tablet Take 5 mg by mouth daily at 3 pm.    Yes Historical Provider, MD    Physical Exam: Filed Vitals:   11/25/15 1515 11/25/15 1520 11/25/15 1545 11/25/15 1915  BP: 105/67  96/63 93/64  Pulse: 115  111 100  Temp:      TempSrc:      Resp: 36  32 35  Height:   (1.676 m)    Weight:  110 lb (49.896 kg)    SpO2: 97%  99% 97%      Constitutional: Patient is mildly lethargic. Filed Vitals:   11/25/15 1515 11/25/15 1520 11/25/15 1545 11/25/15 1915  BP: 105/67  96/63 93/64  Pulse: 115  111 100  Temp:      TempSrc:      Resp: 36  32 35  Height:   (1.676 m)    Weight:  110 lb (49.896 kg)    SpO2: 97%  99% 97%   Eyes: Anicteric no pallor. ENMT: No discharge from the ears eyes nose or mouth. Neck: No neck rigidity no mass felt. Respiratory: No rhonchi or crepitations. Cardiovascular: S1-S2  heard. Abdomen: Soft nontender bowel sounds present. Musculoskeletal: No edema. Skin: No rash. Neurologic: Mildly lethargic but arousable. Psychiatric: Lethargic. Has history of dementia.   Labs on Admission: I have personally reviewed following labs and imaging studies  CBC:  Recent Labs Lab 11/25/15 1355  WBC 15.2*  HGB 16.3*  HCT 50.0*  MCV 92.1  PLT 204   Basic Metabolic Panel:  Recent Labs Lab 11/25/15 1355  NA 147*  K 3.5  CL 105  CO2 18*  GLUCOSE 223*  BUN 23*  CREATININE 1.88*  CALCIUM 9.6   GFR: Estimated Creatinine Clearance: 14.7 mL/min (by C-G formula based on Cr of 1.88). Liver Function Tests:  Recent Labs Lab 11/25/15 1355  AST 46*  ALT 16  ALKPHOS 79  BILITOT 1.2  PROT 7.1  ALBUMIN 3.4*    No results for input(s): LIPASE, AMYLASE in the last 168 hours. No results for input(s): AMMONIA in the last 168 hours. Coagulation Profile: No results for input(s): INR, PROTIME in the last 168 hours. Cardiac Enzymes: No results for input(s): CKTOTAL, CKMB, CKMBINDEX, TROPONINI in the last 168 hours. BNP (last 3 results) No results for input(s): PROBNP in the last 8760 hours. HbA1C: No results for input(s): HGBA1C in the last 72 hours. CBG: No results for input(s): GLUCAP in the last 168 hours. Lipid Profile: No results for input(s): CHOL, HDL, LDLCALC, TRIG, CHOLHDL, LDLDIRECT in the last 72 hours. Thyroid Function Tests: No results for input(s): TSH, T4TOTAL, FREET4, T3FREE, THYROIDAB in the last 72 hours. Anemia Panel: No results for input(s): VITAMINB12, FOLATE, FERRITIN, TIBC, IRON, RETICCTPCT in the last 72 hours. Urine analysis:    Component Value Date/Time   COLORURINE RED* 11/25/2015 1512   APPEARANCEUR TURBID* 11/25/2015 1512   LABSPEC 1.013 11/25/2015 1512   PHURINE 8.5* 11/25/2015 1512   GLUCOSEU NEGATIVE 11/25/2015 1512   HGBUR LARGE* 11/25/2015 1512   BILIRUBINUR NEGATIVE 11/25/2015 1512   KETONESUR 15* 11/25/2015 1512   PROTEINUR 100* 11/25/2015 1512   UROBILINOGEN 1.0 05/23/2015 1955   NITRITE POSITIVE* 11/25/2015 1512   LEUKOCYTESUR MODERATE* 11/25/2015 1512   Sepsis Labs: @LABRCNTIP (procalcitonin:4,lacticidven:4) )No results found for this or any previous visit (from the past 240 hour(s)).   Radiological Exams on Admission: Dg Chest 2 View  11/25/2015  CLINICAL DATA:  Vomiting today. Question aspiration. Initial encounter. EXAM: CHEST  2 VIEW COMPARISON:  Single-view of the chest 12/06/2014 and 08/31/2014. FINDINGS: The lungs are clear. Heart size is normal. No pneumothorax or pleural effusion. No focal bony abnormality. Aortic atherosclerosis noted. IMPRESSION: Negative for aspiration.  No acute disease. Electronically Signed   By: Drusilla Kanner M.D.    On: 11/25/2015 14:22    EKG: Independently reviewed. Sinus tachycardia.  Assessment/Plan Principal Problem:   Severe sepsis (HCC) Active Problems:   Alzheimer's dementia   Acute encephalopathy   UTI (urinary tract infection)   AKI (acute kidney injury) (HCC)    #1. Severe sepsis from UTI - appreciate critical care consult. Patient is on ceftriaxone. Follow lactic acid levels procalcitonin levels blood cultures and urine cultures. Continue with hydration. If patient's blood pressure does not improve and does not maintain MAP then may consider vasopressors. #2. Acute encephalopathy secondary to #1 - improving with antibiotics and fluids. Closely observe. #3. Acute renal failure - probably from hypotension and nausea vomiting. Continue with hydration and closely follow metabolic panel intake output. #4. Dementia - will continue home medications once patient is more alert and awake. #5. Elevated troponin - probably secondary to  hypotension and sepsis. EKG does not show anything acute. Cycle cardiac markers.   DVT prophylaxis: Lovenox. Code Status: DO NOT RESUSCITATE.  Family Communication: Patient's son.  Disposition Plan: Home.  Consults called: Critical care.  Admission status: Inpatient. Likely stay 2-3 days.    Eduard ClosKAKRAKANDY,Raeya Merritts N. MD [facility] Pager [phone].  If 7PM-7AM, please contact night-coverage [email or web address] Password [email or web address]  11/25/2015, 8:04 PM

## 2015-11-25 NOTE — ED Notes (Signed)
Per family, pts baseline is that she is verbal, has alzheimers and at baseline with intermittently recognize her son. Son reports pt seeming normal this am, but did have episode of n/v. Reports being less verbal today with family. Also reports low grade fever, recent cough/sob and recently expressing pain to lower abd. Pt is hypotensive and HR 150 at triage, remains nonverbal.

## 2015-11-25 NOTE — ED Provider Notes (Signed)
CSN: [id number]     Arrival date & time 11/25/15  1304 History   First MD Initiated Contact with Patient 11/25/15 1326     Chief Complaint  Patient presents with  . Altered Mental Status   HPI Patient is a 82 year old female with past medical history of dementia and arthritis who presents with nausea, vomiting and decreased mental status. History is limited due to patient's baseline dementia. History obtained mainly from son who lives with patient. [place] patient was at her baseline yesterday when she went to bed. She had one episode of emesis this morning. He attempted to feed her some interim breakfast following this that she had subsequent tumor episodes of vomiting. Following this he felt that she "vagal'd" and experienced a decline in her mental status where she was minimally responsive to him. She has a history of similar episodes, usually associated with bowel movements. On presentation to triage patient was noted to have a near syncopal episode. She was tachycardic in the 150s and hypotensive with blood pressure 88/71. Son denies fever, cough, or other infectious symptoms recently.  Past Medical History  Diagnosis Date  . Dementia   . Arthritis    History reviewed. No pertinent past surgical history. Family History  Problem Relation Age of Onset  . Hypertension Mother   . Hypertension Father    Social History  Substance Use Topics  . Smoking status: Never Smoker   . Smokeless tobacco: None  . Alcohol Use: No   OB History    No data available     Review of Systems  Constitutional: Negative for fever (temporal temp 99.9 at home pta).  HENT: Negative for congestion and rhinorrhea.   Respiratory: Negative for cough.   Gastrointestinal: Positive for nausea, vomiting and abdominal pain. Negative for diarrhea and constipation.  Genitourinary: Positive for difficulty urinating.  Skin: Negative for rash and wound.  Neurological:       Dementia, not oriented at baseline  All  other systems reviewed and are negative.     Allergies  Review of patient's allergies indicates no known allergies.  Home Medications   Prior to Admission medications   Medication Sig Start Date End Date Taking? Authorizing Provider  cholecalciferol (VITAMIN D) 1000 units tablet Take 1,000 Units by mouth daily at 3 pm.   Yes Historical Provider, MD  docusate sodium (COLACE) 100 MG capsule Take 100 mg by mouth daily at 3 pm.    Yes Historical Provider, MD  donepezil (ARICEPT) 10 MG tablet Take 10 mg by mouth daily at 3 pm.    Yes Historical Provider, MD  memantine (NAMENDA) 5 MG tablet Take 5 mg by mouth daily at 3 pm.    Yes Historical Provider, MD   BP 93/64 mmHg  Pulse 100  Temp(Src) 102.4 F (39.1 C) (Rectal)  Resp 35  Ht 5\' 6"  (1.676 m)  Wt 49.896 kg  BMI 17.76 kg/m2  SpO2 97% Physical Exam  Constitutional:  Thin, appears chronically ill  HENT:  Head: Normocephalic and atraumatic.  Eyes: Pupils are equal, round, and reactive to light.  Neck: Normal range of motion. Neck supple.  Cardiovascular: Regular rhythm and intact distal pulses.  Tachycardia present.   Pulmonary/Chest: Effort normal and breath sounds normal. No respiratory distress. She has no wheezes. She has no rales.  Abdominal:  Mild suprapubic TTP  Neurological: She is alert.  PERRL. Does not follow commands on exam. Moves all extremities.   Skin: Skin is warm and dry.  ED Course  Procedures (including critical care time) Labs Review Labs Reviewed  COMPREHENSIVE METABOLIC PANEL - Abnormal; Notable for the following:    Sodium 147 (*)    CO2 18 (*)    Glucose, Bld 223 (*)    BUN 23 (*)    Creatinine, Ser 1.88 (*)    Albumin 3.4 (*)    AST 46 (*)    GFR calc non Af Amer 22 (*)    GFR calc Af Amer 25 (*)    Anion gap 24 (*)    All other components within normal limits  CBC - Abnormal; Notable for the following:    WBC 15.2 (*)    RBC 5.43 (*)    Hemoglobin 16.3 (*)    HCT 50.0 (*)    All  other components within normal limits  URINALYSIS, ROUTINE W REFLEX MICROSCOPIC (NOT AT [facility]) - Abnormal; Notable for the following:    Color, Urine RED (*)    APPearance TURBID (*)    pH 8.5 (*)    Hgb urine dipstick LARGE (*)    Ketones, ur 15 (*)    Protein, ur 100 (*)    Nitrite POSITIVE (*)    Leukocytes, UA MODERATE (*)    All other components within normal limits  URINE MICROSCOPIC-ADD ON - Abnormal; Notable for the following:    Squamous Epithelial / LPF 0-5 (*)    Bacteria, UA MANY (*)    All other components within normal limits  I-STAT TROPOININ, ED - Abnormal; Notable for the following:    Troponin i, poc 0.96 (*)    All other components within normal limits  I-STAT CG4 LACTIC ACID, ED - Abnormal; Notable for the following:    Lactic Acid, Venous 12.01 (*)    All other components within normal limits  I-STAT CG4 LACTIC ACID, ED - Abnormal; Notable for the following:    Lactic Acid, Venous 9.03 (*)    All other components within normal limits  CULTURE, BLOOD (ROUTINE X 2)  CULTURE, BLOOD (ROUTINE X 2)  URINE CULTURE  I-STAT CG4 LACTIC ACID, ED  I-STAT CG4 LACTIC ACID, ED    Imaging Review Dg Chest 2 View  11/25/2015  CLINICAL DATA:  Vomiting today. Question aspiration. Initial encounter. EXAM: CHEST  2 VIEW COMPARISON:  Single-view of the chest 12/06/2014 and 08/31/2014. FINDINGS: The lungs are clear. Heart size is normal. No pneumothorax or pleural effusion. No focal bony abnormality. Aortic atherosclerosis noted. IMPRESSION: Negative for aspiration.  No acute disease. Electronically Signed   By: Drusilla Kannerhomas  Dalessio M.D.   On: 11/25/2015 14:22   I have personally reviewed and evaluated these images and lab results as part of my medical decision-making.   EKG Interpretation   Date/Time:  Monday Nov 25 2015 14:42:09 EDT Ventricular Rate:  111 PR Interval:  129 QRS Duration: 78 QT Interval:  376 QTC Calculation: 511 R Axis:   63 Text Interpretation:  Sinus  tachycardia Anterior infarct, old Borderline  repolarization abnormality Prolonged QT interval Confirmed by Jodi MourningZAVITZ MD,  Ivin BootyJOSHUA [id number]) on 11/25/2015 3:36:32 PM      MDM   Final diagnoses:  AKI (acute kidney injury) (HCC)  Sepsis secondary to UTI (HCC)  Septic shock (HCC)  Lactic acidosis   82 year old female presents with nausea, vomiting and decreased mental status. On initial presentation to triage patient was tachycardic in the 150s with blood pressure 88/71; however reports that her blood pressure cuff was too large.   Upon my evaluation  in the ED, patient is febrile by rectal temp 102.4, tachycardic in the 120s with blood pressure soft 100s/60s on the monitor. EKG sinus tachycardia with wandering baseline but no ischemic changes. She is at her baseline mental status per son. Exam is difficult due to poor patient cooperation but does not reveal any obvious focal neurologic deficits. She does appear to have mild suprapubic tenderness to palpation.  An in and out catheter was attempted but nursing was unable to obtain a urine sample. Bladder scan showed full bladder. Subsequent bedside ultrasound showed a bladder volume of 447 mL. Foley catheter was placed successfully with immediate evacuation of >400 mL of dark urine.  Given fever and tachycardia code sepsis was initiated. Blood and urine cultures were drawn, 30cc/kg fluid bolus initiated, IV Rocephin given. Labs significant for leukocytosis, Acute kidney injury and lactic acidosis. Chest x-ray is negative for signs of pneumonia or aspiration. Urinalysis positive for nitrates, leuks, white blood cells and numerous bacteria. Lactic acidosis significantly elevated at 12. Likely septic shock secondary to UTI.   Intensivist was consulted to evaluate the patient for admission to MICU. As her lactate is clearing (9 from 12), BP has stabilized with MAP >65, intensivist feels she is stable for floor vs stepdown unit. Plan to admit to stepdown unit. The  ICU team will continue to follow patient and intervene as needed.   Discussed with Dr. Jodi Mourning.     Isa Rankin, MD 11/25/15 1930  Blane Ohara, MD 11/26/15 [phone]

## 2015-11-25 NOTE — ED Notes (Signed)
In and out cath attempted.  Unable to obtain sample.

## 2015-11-26 DIAGNOSIS — A4159 Other Gram-negative sepsis: Secondary | ICD-10-CM | POA: Diagnosis present

## 2015-11-26 DIAGNOSIS — A419 Sepsis, unspecified organism: Secondary | ICD-10-CM | POA: Diagnosis present

## 2015-11-26 DIAGNOSIS — N3000 Acute cystitis without hematuria: Secondary | ICD-10-CM

## 2015-11-26 DIAGNOSIS — R6521 Severe sepsis with septic shock: Secondary | ICD-10-CM

## 2015-11-26 LAB — COMPREHENSIVE METABOLIC PANEL
ALT: 11 U/L — ABNORMAL LOW (ref 14–54)
ANION GAP: 14 (ref 5–15)
AST: 24 U/L (ref 15–41)
Albumin: 2.1 g/dL — ABNORMAL LOW (ref 3.5–5.0)
Alkaline Phosphatase: 50 U/L (ref 38–126)
BUN: 23 mg/dL — ABNORMAL HIGH (ref 6–20)
CALCIUM: 7.9 mg/dL — AB (ref 8.9–10.3)
CHLORIDE: 112 mmol/L — AB (ref 101–111)
CO2: 19 mmol/L — AB (ref 22–32)
CREATININE: 0.86 mg/dL (ref 0.44–1.00)
GFR calc non Af Amer: 56 mL/min — ABNORMAL LOW (ref >60)
Glucose, Bld: 159 mg/dL — ABNORMAL HIGH (ref 65–99)
POTASSIUM: 3 mmol/L — AB (ref 3.5–5.1)
SODIUM: 145 mmol/L (ref 135–145)
TOTAL PROTEIN: 4.8 g/dL — AB (ref 6.5–8.1)
Total Bilirubin: 0.5 mg/dL (ref 0.3–1.2)

## 2015-11-26 LAB — BLOOD CULTURE ID PANEL (REFLEXED)
ACINETOBACTER BAUMANNII: NOT DETECTED
CANDIDA KRUSEI: NOT DETECTED
CARBAPENEM RESISTANCE: NOT DETECTED
Candida albicans: NOT DETECTED
Candida glabrata: NOT DETECTED
Candida parapsilosis: NOT DETECTED
Candida tropicalis: NOT DETECTED
ENTEROBACTERIACEAE SPECIES: NOT DETECTED
Enterobacter cloacae complex: NOT DETECTED
Enterococcus species: NOT DETECTED
Escherichia coli: NOT DETECTED
Haemophilus influenzae: NOT DETECTED
KLEBSIELLA OXYTOCA: NOT DETECTED
Klebsiella pneumoniae: NOT DETECTED
Listeria monocytogenes: NOT DETECTED
METHICILLIN RESISTANCE: NOT DETECTED
NEISSERIA MENINGITIDIS: NOT DETECTED
PSEUDOMONAS AERUGINOSA: NOT DETECTED
Proteus species: DETECTED — AB
STAPHYLOCOCCUS AUREUS BCID: NOT DETECTED
STAPHYLOCOCCUS SPECIES: NOT DETECTED
STREPTOCOCCUS AGALACTIAE: NOT DETECTED
STREPTOCOCCUS SPECIES: NOT DETECTED
Serratia marcescens: NOT DETECTED
Streptococcus pneumoniae: NOT DETECTED
Streptococcus pyogenes: NOT DETECTED
VANCOMYCIN RESISTANCE: NOT DETECTED

## 2015-11-26 LAB — CBC WITH DIFFERENTIAL/PLATELET
BASOS PCT: 0 %
Basophils Absolute: 0 10*3/uL (ref 0.0–0.1)
EOS ABS: 0 10*3/uL (ref 0.0–0.7)
Eosinophils Relative: 0 %
HCT: 34.7 % — ABNORMAL LOW (ref 36.0–46.0)
Hemoglobin: 11.5 g/dL — ABNORMAL LOW (ref 12.0–15.0)
Lymphocytes Relative: 7 %
Lymphs Abs: 1.9 10*3/uL (ref 0.7–4.0)
MCH: 29.7 pg (ref 26.0–34.0)
MCHC: 33.1 g/dL (ref 30.0–36.0)
MCV: 89.7 fL (ref 78.0–100.0)
MONO ABS: 1.6 10*3/uL — AB (ref 0.1–1.0)
Monocytes Relative: 6 %
NEUTROS ABS: 23.2 10*3/uL — AB (ref 1.7–7.7)
NEUTROS PCT: 87 %
PLATELETS: 142 10*3/uL — AB (ref 150–400)
RBC: 3.87 MIL/uL (ref 3.87–5.11)
RDW: 14.3 % (ref 11.5–15.5)
WBC: 26.7 10*3/uL — ABNORMAL HIGH (ref 4.0–10.5)

## 2015-11-26 LAB — GLUCOSE, CAPILLARY
Glucose-Capillary: 103 mg/dL — ABNORMAL HIGH (ref 65–99)
Glucose-Capillary: 119 mg/dL — ABNORMAL HIGH (ref 65–99)
Glucose-Capillary: 171 mg/dL — ABNORMAL HIGH (ref 65–99)
Glucose-Capillary: 92 mg/dL (ref 65–99)

## 2015-11-26 LAB — TROPONIN I
TROPONIN I: 0.33 ng/mL — AB (ref <0.031)
Troponin I: 0.54 ng/mL (ref <0.031)
Troponin I: 0.41 ng/mL — ABNORMAL HIGH (ref <0.031)

## 2015-11-26 LAB — LACTIC ACID, PLASMA
Lactic Acid, Venous: 6.2 mmol/L (ref 0.5–2.0)
Lactic Acid, Venous: 1.6 mmol/L (ref 0.5–2.0)

## 2015-11-26 MED ORDER — SODIUM CHLORIDE 0.9 % IV SOLN
100.0000 mL/h | INTRAVENOUS | Status: AC
  Administered 2015-11-26: 100 mL/h via INTRAVENOUS

## 2015-11-26 MED ORDER — DEXTROSE 5 % IV SOLN
2.0000 g | INTRAVENOUS | Status: DC
  Administered 2015-11-26 – 2015-11-30 (×5): 2 g via INTRAVENOUS
  Filled 2015-11-26 (×5): qty 2

## 2015-11-26 NOTE — Progress Notes (Addendum)
PROGRESS NOTE  [name]:[id number] DOB: [birth year] DOA: 11/25/2015 PCP: Pcp Not In System Outpatient Specialists:    LOS: 1 day   Brief Narrative: 82 y.o. female with medical history significant of dementia was brought to the ER after patient became less responsive, found to have sepsis due to UTI in the ED. PCCM consulted and patient was admitted to SDU.   Assessment & Plan: Principal Problem:   Severe sepsis (HCC) Active Problems:   Alzheimer's dementia   Acute encephalopathy   UTI (urinary tract infection)   AKI (acute kidney injury) (HCC)   Proteus septicemia (HCC)   Severe sepsis from UTI with Gram Negative Bacteremia - appreciate critical care consult, sepsis physiology improved this morning, she is afebrile, heart rate improved - continue Ceftriaxone, fluids, remain in SDU today - blood cultures with GNRs, reflex panel with Proteus, sensitivities pending - lactic acid improving  Acute encephalopathy secondary to #1  - improving with antibiotics and fluids. Closely observe.  Acute renal failure  - probably from hypotension and nausea vomiting.  - improved with fluids  Dementia  - will continue home medications once patient is more alert and awake.  Elevated troponin  - probably secondary to hypotension and sepsis. EKG does not show anything acute. Cycle cardiac markers.   DVT prophylaxis: Lovenox Code Status: DNR Family Communication: no family bedside Disposition Plan: home when ready  Barriers for discharge: IV antibiotics  Consultants:   PCCM  Procedures:   None   Antimicrobials:  Ceftriaxone 5/8 >>   Subjective: - no complaints this morning, denies chest pain / palpitations. Confused.   Objective: Filed Vitals:   11/26/15 0200 11/26/15 0400 11/26/15 0524 11/26/15 0736  BP: 92/59   104/57  Pulse: 93   95  Temp:  98.5 F (36.9 C)  97.7 F (36.5 C)  TempSrc:  Oral  Axillary  Resp: 29   29  Height:      Weight:   52.527 kg  (115 lb 12.8 oz)   SpO2: 96%   98%    Intake/Output Summary (Last 24 hours) at 11/26/15 0825 Last data filed at 11/26/15 0409  Gross per 24 hour  Intake 2098.33 ml  Output   1675 ml  Net 423.33 ml   Filed Weights   11/25/15 1520 11/25/15 2031 11/26/15 0524  Weight: 49.896 kg (110 lb) 51.6 kg (113 lb 12.1 oz) 52.527 kg (115 lb 12.8 oz)    Examination: Constitutional: NAD Filed Vitals:   11/26/15 0200 11/26/15 0400 11/26/15 0524 11/26/15 0736  BP: 92/59   104/57  Pulse: 93   95  Temp:  98.5 F (36.9 C)  97.7 F (36.5 C)  TempSrc:  Oral  Axillary  Resp: 29   29  Height:      Weight:   52.527 kg (115 lb 12.8 oz)   SpO2: 96%   98%   Eyes: PERRL ENMT: Mucous membranes are dry Respiratory: clear to auscultation bilaterally, no wheezing, no crackles. Normal respiratory effort. No accessory muscle use.  Cardiovascular: Regular rate and rhythm, no murmurs / rubs / gallops. No extremity edema. 2+ pedal pulses.  Abdomen: no tenderness. Bowel sounds positive.  Neurologic: non focal    Data Reviewed: I have personally reviewed following labs and imaging studies  CBC:  Recent Labs Lab 11/25/15 1355 11/26/15 0320  WBC 15.2* 26.7*  NEUTROABS  --  23.2*  HGB 16.3* 11.5*  HCT 50.0* 34.7*  MCV 92.1 89.7  PLT 204 142*  Basic Metabolic Panel:  Recent Labs Lab 11/25/15 1355 11/26/15 0320  NA 147* 145  K 3.5 3.0*  CL 105 112*  CO2 18* 19*  GLUCOSE 223* 159*  BUN 23* 23*  CREATININE 1.88* 0.86  CALCIUM 9.6 7.9*   GFR:  Estimated Creatinine Clearance: 33.9 mL/min (by C-G formula based on Cr of 0.86). Liver Function Tests:  Recent Labs Lab 11/25/15 1355 11/26/15 0320  AST 46* 24  ALT 16 11*  ALKPHOS 79 50  BILITOT 1.2 0.5  PROT 7.1 4.8*  ALBUMIN 3.4* 2.1*   Cardiac Enzymes:  Recent Labs Lab 11/26/15 0320  TROPONINI 0.54*   CBG:  Recent Labs Lab 11/26/15 0013 11/26/15 0602  GLUCAP 171* 103*   Urine analysis:    Component Value Date/Time    COLORURINE RED* 11/25/2015 1512   APPEARANCEUR TURBID* 11/25/2015 1512   LABSPEC 1.013 11/25/2015 1512   PHURINE 8.5* 11/25/2015 1512   GLUCOSEU NEGATIVE 11/25/2015 1512   HGBUR LARGE* 11/25/2015 1512   BILIRUBINUR NEGATIVE 11/25/2015 1512   KETONESUR 15* 11/25/2015 1512   PROTEINUR 100* 11/25/2015 1512   UROBILINOGEN 1.0 05/23/2015 1955   NITRITE POSITIVE* 11/25/2015 1512   LEUKOCYTESUR MODERATE* 11/25/2015 1512   Sepsis Labs: Invalid input(s): PROCALCITONIN, LACTICIDVEN  Recent Results (from the past 240 hour(s))  Blood Culture (routine x 2)     Status: None (Preliminary result)   Collection Time: 11/25/15  1:55 PM  Result Value Ref Range Status   Specimen Description BLOOD LEFT ANTECUBITAL  Final   Special Requests BOTTLES DRAWN AEROBIC AND ANAEROBIC 5CC  Final   Culture  Setup Time   Final    GRAM NEGATIVE RODS ANAEROBIC BOTTLE ONLY CRITICAL RESULT CALLED TO, READ BACK BY AND VERIFIED WITH: Maryann Alar AT 4098 11/26/15 BY L BENFIELD    Culture PENDING  Incomplete   Report Status PENDING  Incomplete  Blood Culture (routine x 2)     Status: None (Preliminary result)   Collection Time: 11/25/15  2:25 PM  Result Value Ref Range Status   Specimen Description BLOOD RIGHT ANTECUBITAL  Final   Special Requests BOTTLES DRAWN AEROBIC AND ANAEROBIC 5CC  Final   Culture  Setup Time   Final    GRAM NEGATIVE RODS IN BOTH AEROBIC AND ANAEROBIC BOTTLES Organism ID to follow CRITICAL RESULT CALLED TO, READ BACK BY AND VERIFIED WITH: Maryann Alar AT 1191 11/26/15 BY L BENFIELD    Culture PENDING  Incomplete   Report Status PENDING  Incomplete  Blood Culture ID Panel (Reflexed)     Status: Abnormal   Collection Time: 11/25/15  2:25 PM  Result Value Ref Range Status   Enterococcus species NOT DETECTED NOT DETECTED Final   Vancomycin resistance NOT DETECTED NOT DETECTED Final   Listeria monocytogenes NOT DETECTED NOT DETECTED Final   Staphylococcus species NOT DETECTED NOT DETECTED  Final   Staphylococcus aureus NOT DETECTED NOT DETECTED Final   Methicillin resistance NOT DETECTED NOT DETECTED Final   Streptococcus species NOT DETECTED NOT DETECTED Final   Streptococcus agalactiae NOT DETECTED NOT DETECTED Final   Streptococcus pneumoniae NOT DETECTED NOT DETECTED Final   Streptococcus pyogenes NOT DETECTED NOT DETECTED Final   Acinetobacter baumannii NOT DETECTED NOT DETECTED Final   Enterobacteriaceae species NOT DETECTED NOT DETECTED Final   Enterobacter cloacae complex NOT DETECTED NOT DETECTED Final   Escherichia coli NOT DETECTED NOT DETECTED Final   Klebsiella oxytoca NOT DETECTED NOT DETECTED Final   Klebsiella pneumoniae NOT DETECTED NOT DETECTED  Final   Proteus species DETECTED (A) NOT DETECTED Final    Comment: CRITICAL RESULT CALLED TO, READ BACK BY AND VERIFIED WITH: Maryann AlarJ FRENS,PHARM D AT [id number] 11/26/15 BY L BENFIELD    Serratia marcescens NOT DETECTED NOT DETECTED Final   Carbapenem resistance NOT DETECTED NOT DETECTED Final   Haemophilus influenzae NOT DETECTED NOT DETECTED Final   Neisseria meningitidis NOT DETECTED NOT DETECTED Final   Pseudomonas aeruginosa NOT DETECTED NOT DETECTED Final   Candida albicans NOT DETECTED NOT DETECTED Final   Candida glabrata NOT DETECTED NOT DETECTED Final   Candida krusei NOT DETECTED NOT DETECTED Final   Candida parapsilosis NOT DETECTED NOT DETECTED Final   Candida tropicalis NOT DETECTED NOT DETECTED Final  MRSA PCR Screening     Status: None   Collection Time: 11/25/15  8:26 PM  Result Value Ref Range Status   MRSA by PCR NEGATIVE NEGATIVE Final    Comment:        The GeneXpert MRSA Assay (FDA approved for NASAL specimens only), is one component of a comprehensive MRSA colonization surveillance program. It is not intended to diagnose MRSA infection nor to guide or monitor treatment for MRSA infections.       Radiology Studies: Dg Chest 2 View  11/25/2015  CLINICAL DATA:  Vomiting today. Question  aspiration. Initial encounter. EXAM: CHEST  2 VIEW COMPARISON:  Single-view of the chest 12/06/2014 and 08/31/2014. FINDINGS: The lungs are clear. Heart size is normal. No pneumothorax or pleural effusion. No focal bony abnormality. Aortic atherosclerosis noted. IMPRESSION: Negative for aspiration.  No acute disease. Electronically Signed   By: Drusilla Kannerhomas  Dalessio M.D.   On: 11/25/2015 14:22   Dg Abd Portable 1v  11/25/2015  CLINICAL DATA:  82 year old female with pain nausea vomiting. Patient was admitted for sepsis. EXAM: PORTABLE ABDOMEN - 1 VIEW COMPARISON:  Pelvic radiograph dated 12/06/2014 FINDINGS: Large amount of dense stool noted throughout the distal colon and rectosigmoid. There is no evidence of bowel obstruction. Right upper quadrant cholecystectomy clips. A 5 mm rounded density with eggshell calcification superimposed over the right iliac bone appears similar to prior study. There is scoliosis with advanced degenerative changes of the spine. There are osteoarthritic changes of the hip joints with narrowing of the femoroacetabular joint space. There are sclerotic changes of the femoral heads. No acute fracture. The soft tissues are grossly unremarkable. IMPRESSION: Constipation.  No evidence of bowel obstruction. Scoliosis, degenerative changes of the spine, and osteoarthritic changes of the hips. No acute fracture. Electronically Signed   By: Elgie CollardArash  Radparvar M.D.   On: 11/25/2015 21:40     Scheduled Meds: . cefTRIAXone (ROCEPHIN)  IV  1 g Intravenous Q24H  . cholecalciferol  1,000 Units Oral Q1500  . docusate sodium  100 mg Oral Q1500  . donepezil  10 mg Oral Q1500  . enoxaparin (LOVENOX) injection  30 mg Subcutaneous Q24H  . memantine  5 mg Oral Q1500   Continuous Infusions: . sodium chloride 100 mL/hr at 11/25/15 2033    Pamella Pertostin Gherghe, MD, PhD [facility] Pager [phone]  If 7PM-7AM, please contact night-coverage [email or web address] Password [email or web address] 11/26/2015, 8:25 AM

## 2015-11-26 NOTE — Progress Notes (Signed)
PULMONARY / CRITICAL CARE MEDICINE   Name: [name] MRN: [id number] DOB: 10-21-1920    ADMISSION DATE:  11/25/2015 CONSULTATION DATE:  11/25/2015  REFERRING MD:  EDP  CHIEF COMPLAINT: AMS  HISTORY OF PRESENT ILLNESS:   82 year old female past medical history as below, which is significant for Alzheimer's dementia. At baseline, she lives with her son is her caretaker. She spends most of her day in bed or on the couch and is able to use walker with assistance of her son. She will intermittently recognize him and is moderately verbal. She was in her usual state of health until about last Thursday 5/4 when she began complaining of intermittent abdominal pain. Son also reports that she would "grab at" her perineum frequently. These symptoms continued through the weekend until today 5/8 when she had one episode of vomiting and shortly after became minimally responsive. Her son called EMS and she was transferred to [facility] emergency department where she was noted to be tachycardic and hypotensive with systolic blood pressures in the 70s. She is provided with 2 L of IV fluid and blood pressures improved to systolic in the 29F to low 100s. Lactic acid resulted at 12. Urinalysis was suspicious for urinary tract infection she was started on empiric Rocephin. Cultures were obtained and sent. Due to hypotension and elevated lactic acid and critical care medicine has been asked to evaluate for admission. The patient's son states that when he called EMS mother initially had altered mental status and was not able to follow commands. He currently believes that her mental status has returned to baseline.   SUBJECTIVE:  Son reports that her mental status continues to normalize and she has been drinking ensures as usual.   VITAL SIGNS: BP 104/57 mmHg  Pulse 95  Temp(Src) 98 F (36.7 C) (Axillary)  Resp 29  Ht _0  (1.676 m)  Wt 52.527 kg (115 lb 12.8 oz)  BMI 18.70 kg/m2  SpO2  98%  HEMODYNAMICS:    VENTILATOR SETTINGS:    INTAKE / OUTPUT: I/O last 3 completed shifts: In: 2098.3 [I.V.:2098.3] Out: 1675 [Urine:1675]  PHYSICAL EXAMINATION: General:  Elderly cachectic female in NAD Neuro:  Follows simple commands, more verbal lst night and this AM per son, PERRL, at baseline per son HEENT:  [place]/AT, no JVD Cardiovascular:  Tachy (110), regular, no MRG Lungs:  Clear bilateral breath sounds Abdomen:  Soft, non-tender, non-distended. Foley drainage tea colored with dark sediment.  Musculoskeletal:  No acute deformity or ROM limitaiton Skin:  Grossly intact, MM appear dry, skin turgor tenting over clavicle and dorsal surface of hands. No lower extremity edema.   LABS:  BMET  Recent Labs Lab 11/25/15 1355 11/26/15 0320  NA 147* 145  K 3.5 3.0*  CL 105 112*  CO2 18* 19*  BUN 23* 23*  CREATININE 1.88* 0.86  GLUCOSE 223* 159*    Electrolytes  Recent Labs Lab 11/25/15 1355 11/26/15 0320  CALCIUM 9.6 7.9*    CBC  Recent Labs Lab 11/25/15 1355 11/26/15 0320  WBC 15.2* 26.7*  HGB 16.3* 11.5*  HCT 50.0* 34.7*  PLT 204 142*    Coag's  Recent Labs Lab 11/25/15 2046  APTT 38*    Sepsis Markers  Recent Labs Lab 11/25/15 2046 11/25/15 2324 11/26/15 0914  LATICACIDVEN 7.2* 6.2* 1.6  PROCALCITON 22.30  --   --     ABG No results for input(s): PHART, PCO2ART, PO2ART in the last 168 hours.  Liver Enzymes  Recent Labs  Lab 11/25/15 1355 11/26/15 0320  AST 46* 24  ALT 16 11*  ALKPHOS 79 50  BILITOT 1.2 0.5  ALBUMIN 3.4* 2.1*    Cardiac Enzymes  Recent Labs Lab 11/26/15 0320 11/26/15 0914  TROPONINI 0.54* 0.41*    Glucose  Recent Labs Lab 11/26/15 0013 11/26/15 0602  GLUCAP 171* 103*    Imaging Dg Chest 2 View  11/25/2015  CLINICAL DATA:  Vomiting today. Question aspiration. Initial encounter. EXAM: CHEST  2 VIEW COMPARISON:  Single-view of the chest 12/06/2014 and 08/31/2014. FINDINGS: The lungs are clear.  Heart size is normal. No pneumothorax or pleural effusion. No focal bony abnormality. Aortic atherosclerosis noted. IMPRESSION: Negative for aspiration.  No acute disease. Electronically Signed   By: Inge Rise M.D.   On: 11/25/2015 14:22   Dg Abd Portable 1v  11/25/2015  CLINICAL DATA:  82 year old female with pain nausea vomiting. Patient was admitted for sepsis. EXAM: PORTABLE ABDOMEN - 1 VIEW COMPARISON:  Pelvic radiograph dated 12/06/2014 FINDINGS: Large amount of dense stool noted throughout the distal colon and rectosigmoid. There is no evidence of bowel obstruction. Right upper quadrant cholecystectomy clips. A 5 mm rounded density with eggshell calcification superimposed over the right iliac bone appears similar to prior study. There is scoliosis with advanced degenerative changes of the spine. There are osteoarthritic changes of the hip joints with narrowing of the femoroacetabular joint space. There are sclerotic changes of the femoral heads. No acute fracture. The soft tissues are grossly unremarkable. IMPRESSION: Constipation.  No evidence of bowel obstruction. Scoliosis, degenerative changes of the spine, and osteoarthritic changes of the hips. No acute fracture. Electronically Signed   By: Anner Crete M.D.   On: 11/25/2015 21:40     STUDIES:    CULTURES: Blood 5/8 > GNR, proteus species >>> Urine 5/8 >>>  ANTIBIOTICS: Ceftriaxone 5/8 >  SIGNIFICANT EVENTS: 5/8 admit  LINES/TUBES:   DISCUSSION: 82 year old female with PMH dementia. Presenting with several days poorly described abd pain, hypotension, AMS. Found to have UTI on UA. BP responded to volume. DNR/DNI per son. Ok pressors if needed.   ASSESSMENT / PLAN:   Hypotension related to severe sepsis and hypovolemia > resolved, lactic cleared Troponin elevation, no ischemia on EKG > trending down Chronic systolic/diastolic CHF (LVEF 09-81%, grade 1 DD 05/2015) -Continue gentle IVF maintenance  -DNR -D/c  further Lactics -Pressors seem to have been avoided  Severe sepsis secondary to urinary tract infection, bacteremia > GNR, proteus - continue ceftriaxone - await susceptibitly  Acute kidney injury > suspect pre-renal ATN > resolved High AG acidosis (lactic) > improved -Follow BMP  Alzheimer's dementia, advanced. - supportive care   FAMILY  - Updates: Son updated by [facility] 5/9  - Inter-disciplinary family meet or Palliative Care meeting due by:  5/15  PCCM will sign off  Georgann Housekeeper, [facility] Pulmonology/Critical Care Pager [phone] or [phone]  11/26/2015 11:14 AM

## 2015-11-26 NOTE — Progress Notes (Signed)
CRITICAL VALUE ALERT  Critical value received:  Trop 0.54  Date of notification:  11/26/2015   Time of notification:  4:40 AM   Critical value read back:Yes.    Nurse who received alert:  Cresenciano LickMikaela Caterin Tabares, RN   MD notified (1st page):  Craige CottaKirby, NP  Time of first page:  4:41 AM   MD notified (2nd page):  Time of second page:  Responding MD:  Craige CottaKirby, NP  Time MD responded:  [phone]

## 2015-11-26 NOTE — Progress Notes (Signed)
  PHARMACY - PHYSICIAN COMMUNICATION CRITICAL VALUE ALERT - BLOOD CULTURE IDENTIFICATION (BCID)  Results for orders placed or performed during the hospital encounter of 11/25/15  Blood Culture (routine x 2)     Status: None (Preliminary result)   Collection Time: 11/25/15  1:55 PM  Result Value Ref Range Status   Specimen Description BLOOD LEFT ANTECUBITAL  Final   Special Requests BOTTLES DRAWN AEROBIC AND ANAEROBIC 5CC  Final   Culture  Setup Time   Final    GRAM NEGATIVE RODS ANAEROBIC BOTTLE ONLY CRITICAL RESULT CALLED TO, READ BACK BY AND VERIFIED WITH: Maryann AlarJ Katesha Eichel,PHARM D AT [id number] 11/26/15 BY L BENFIELD    Culture PENDING  Incomplete   Report Status PENDING  Incomplete  Blood Culture (routine x 2)     Status: None (Preliminary result)   Collection Time: 11/25/15  2:25 PM  Result Value Ref Range Status   Specimen Description BLOOD RIGHT ANTECUBITAL  Final   Special Requests BOTTLES DRAWN AEROBIC AND ANAEROBIC 5CC  Final   Culture  Setup Time   Final    GRAM NEGATIVE RODS IN BOTH AEROBIC AND ANAEROBIC BOTTLES Organism ID to follow CRITICAL RESULT CALLED TO, READ BACK BY AND VERIFIED WITH: Maryann AlarJ Jadyn Barge,PHARM D AT [id number] 11/26/15 BY L BENFIELD    Culture PENDING  Incomplete   Report Status PENDING  Incomplete  Blood Culture ID Panel (Reflexed)     Status: Abnormal   Collection Time: 11/25/15  2:25 PM  Result Value Ref Range Status   Enterococcus species NOT DETECTED NOT DETECTED Final   Vancomycin resistance NOT DETECTED NOT DETECTED Final   Listeria monocytogenes NOT DETECTED NOT DETECTED Final   Staphylococcus species NOT DETECTED NOT DETECTED Final   Staphylococcus aureus NOT DETECTED NOT DETECTED Final   Methicillin resistance NOT DETECTED NOT DETECTED Final   Streptococcus species NOT DETECTED NOT DETECTED Final   Streptococcus agalactiae NOT DETECTED NOT DETECTED Final   Streptococcus pneumoniae NOT DETECTED NOT DETECTED Final   Streptococcus pyogenes NOT DETECTED NOT DETECTED Final    Acinetobacter baumannii NOT DETECTED NOT DETECTED Final   Enterobacteriaceae species NOT DETECTED NOT DETECTED Final   Enterobacter cloacae complex NOT DETECTED NOT DETECTED Final   Escherichia coli NOT DETECTED NOT DETECTED Final   Klebsiella oxytoca NOT DETECTED NOT DETECTED Final   Klebsiella pneumoniae NOT DETECTED NOT DETECTED Final   Proteus species DETECTED (A) NOT DETECTED Final    Comment: CRITICAL RESULT CALLED TO, READ BACK BY AND VERIFIED WITH: Maryann AlarJ Bobbette Eakes,PHARM D AT [phone] 11/26/15 BY L BENFIELD    Serratia marcescens NOT DETECTED NOT DETECTED Final   Carbapenem resistance NOT DETECTED NOT DETECTED Final   Haemophilus influenzae NOT DETECTED NOT DETECTED Final   Neisseria meningitidis NOT DETECTED NOT DETECTED Final   Pseudomonas aeruginosa NOT DETECTED NOT DETECTED Final   Candida albicans NOT DETECTED NOT DETECTED Final   Candida glabrata NOT DETECTED NOT DETECTED Final   Candida krusei NOT DETECTED NOT DETECTED Final   Candida parapsilosis NOT DETECTED NOT DETECTED Final   Candida tropicalis NOT DETECTED NOT DETECTED Final                         Name of physician (or Provider) Contacted: Dr. Elvera LennoxGherghe  Changes to prescribed antibiotics required: Increase ceftriaxone to 2g IV daily  Farrel GobbleFrens, Garth BignessJeremy John

## 2015-11-26 NOTE — Progress Notes (Signed)
CRITICAL VALUE ALERT  Critical value received:  Lactic Acid 6.2  Date of notification:  11/26/2015   Time of notification:  12:51 AM   Critical value read back:Yes.    Nurse who received alert:  Cresenciano LickMikaela Ramsey Guadamuz, RN   MD notified (1st page):  Craige CottaKirby, NP   Time of first page:  12:51 AM   MD notified (2nd page):  Time of second page:  Responding MD:  Craige CottaKirby, NP  Time MD responded:  12:51 AM

## 2015-11-27 ENCOUNTER — Inpatient Hospital Stay (HOSPITAL_COMMUNITY): Payer: Medicare Other

## 2015-11-27 DIAGNOSIS — R509 Fever, unspecified: Secondary | ICD-10-CM

## 2015-11-27 DIAGNOSIS — G309 Alzheimer's disease, unspecified: Secondary | ICD-10-CM

## 2015-11-27 DIAGNOSIS — A4159 Other Gram-negative sepsis: Principal | ICD-10-CM

## 2015-11-27 DIAGNOSIS — F0280 Dementia in other diseases classified elsewhere without behavioral disturbance: Secondary | ICD-10-CM

## 2015-11-27 LAB — COMPREHENSIVE METABOLIC PANEL
ALBUMIN: 2.1 g/dL — AB (ref 3.5–5.0)
ALT: 11 U/L — AB (ref 14–54)
AST: 19 U/L (ref 15–41)
Alkaline Phosphatase: 49 U/L (ref 38–126)
Anion gap: 11 (ref 5–15)
BUN: 23 mg/dL — AB (ref 6–20)
CHLORIDE: 114 mmol/L — AB (ref 101–111)
CO2: 21 mmol/L — AB (ref 22–32)
Calcium: 8 mg/dL — ABNORMAL LOW (ref 8.9–10.3)
Creatinine, Ser: 0.63 mg/dL (ref 0.44–1.00)
GFR calc Af Amer: >60 mL/min (ref >60)
GFR calc non Af Amer: >60 mL/min (ref >60)
GLUCOSE: 91 mg/dL (ref 65–99)
POTASSIUM: 3.2 mmol/L — AB (ref 3.5–5.1)
Sodium: 146 mmol/L — ABNORMAL HIGH (ref 135–145)
Total Bilirubin: 0.5 mg/dL (ref 0.3–1.2)
Total Protein: 4.8 g/dL — ABNORMAL LOW (ref 6.5–8.1)

## 2015-11-27 LAB — FERRITIN: Ferritin: 64 ng/mL (ref 11–307)

## 2015-11-27 LAB — CBC
HEMATOCRIT: 33.3 % — AB (ref 36.0–46.0)
Hemoglobin: 11.2 g/dL — ABNORMAL LOW (ref 12.0–15.0)
MCH: 31.1 pg (ref 26.0–34.0)
MCHC: 33.6 g/dL (ref 30.0–36.0)
MCV: 92.5 fL (ref 78.0–100.0)
Platelets: 126 10*3/uL — ABNORMAL LOW (ref 150–400)
RBC: 3.6 MIL/uL — ABNORMAL LOW (ref 3.87–5.11)
RDW: 14.6 % (ref 11.5–15.5)
WBC: 15 10*3/uL — ABNORMAL HIGH (ref 4.0–10.5)

## 2015-11-27 LAB — IRON AND TIBC
IRON: 12 ug/dL — AB (ref 28–170)
Saturation Ratios: 8 % — ABNORMAL LOW (ref 10.4–31.8)
TIBC: 146 ug/dL — AB (ref 250–450)
UIBC: 134 ug/dL

## 2015-11-27 LAB — ECHOCARDIOGRAM COMPLETE
Height: 66 in
WEIGHTICAEL: 1985.9 [oz_av]

## 2015-11-27 LAB — GLUCOSE, CAPILLARY
GLUCOSE-CAPILLARY: 133 mg/dL — AB (ref 65–99)
GLUCOSE-CAPILLARY: 85 mg/dL (ref 65–99)
Glucose-Capillary: 80 mg/dL (ref 65–99)
Glucose-Capillary: 84 mg/dL (ref 65–99)

## 2015-11-27 LAB — RETICULOCYTES
RBC.: 3.75 MIL/uL — ABNORMAL LOW (ref 3.87–5.11)
Retic Count, Absolute: 48.8 10*3/uL (ref 19.0–186.0)
Retic Ct Pct: 1.3 % (ref 0.4–3.1)

## 2015-11-27 LAB — VITAMIN B12: VITAMIN B 12: 269 pg/mL (ref 180–914)

## 2015-11-27 LAB — FOLATE: FOLATE: 7.2 ng/mL (ref >5.9)

## 2015-11-27 MED ORDER — POTASSIUM CHLORIDE CRYS ER 20 MEQ PO TBCR
40.0000 meq | EXTENDED_RELEASE_TABLET | Freq: Once | ORAL | Status: DC
  Filled 2015-11-27: qty 2

## 2015-11-27 MED ORDER — POTASSIUM CHLORIDE 20 MEQ/15ML (10%) PO SOLN
40.0000 meq | Freq: Once | ORAL | Status: DC
Start: 2015-11-27 — End: 2015-11-30
  Filled 2015-11-27: qty 30

## 2015-11-27 MED ORDER — SODIUM CHLORIDE 0.9 % IV SOLN
INTRAVENOUS | Status: DC
  Administered 2015-11-27: 18:00:00 via INTRAVENOUS

## 2015-11-27 NOTE — Progress Notes (Signed)
[facility]                                                                              Patient Demographics  [name], is a 82 y.o. female, DOB - Nov 30, 1920, KVQ:[id number]  Admit date - 11/25/2015   Admitting Physician Eduard Clos, MD  Outpatient Primary MD for the patient is Pcp Not In System  Outpatient specialists:   LOS - 2  days    Chief Complaint  Patient presents with  . Altered Mental Status       Brief summary   82 y.o. female with medical history significant of dementia was brought to the ER after patient became less responsive, found to have sepsis due to UTI in the ED. PCCM was consulted and patient was admitted to SDU.    Assessment & Plan   Severe sepsis from Proteus mirabilis UTI with Proteus Bacteremia - sepsis physiology improving, leukocytosis improving, afebrile, heart rate improving.  - continue Ceftriaxone, IV fluids - Lactic acid improved  Acute encephalopathy secondary to #1 -improving - improving with antibiotics and fluids. Closely observe.  Acute renal failure  - Resolved, probably from hypotension and nausea vomiting.  - Continue gentle hydration  Dementia  - Continue Aricept, Namenda   Elevated troponin  - probably secondary to hypotension and sepsis. EKG does not show anything acute.  - Troponins trending down, obtain 2-D echo   Code Status: dnr  DVT Prophylaxis:  Lovenox   Family Communication:  Called patient's son, unable to contact, left a detailed message.  Disposition Plan: Transfer to telemetry  Time Spent in minutes   25 minutes  Procedures:  None   Consultants:   PCCM  Antimicrobials:   Rocephin started 11/26/15   Medications  Scheduled Meds: . cefTRIAXone (ROCEPHIN)  IV  2 g Intravenous Q24H  . cholecalciferol  1,000 Units Oral Q1500  . docusate sodium  100 mg Oral Q1500  . donepezil  10 mg Oral Q1500  . enoxaparin (LOVENOX) injection  30 mg Subcutaneous Q24H  .  memantine  5 mg Oral Q1500  . potassium chloride  40 mEq Oral Once   Continuous Infusions:  PRN Meds:.acetaminophen **OR** acetaminophen, ondansetron **OR** ondansetron (ZOFRAN) IV   Antibiotics   Anti-infectives    Start     Dose/Rate Route Frequency Ordered Stop   11/26/15 1300  cefTRIAXone (ROCEPHIN) 1 g in dextrose 5 % 50 mL IVPB  Status:  Discontinued     1 g 100 mL/hr over 30 Minutes Intravenous Every 24 hours 11/25/15 2025 11/26/15 0825   11/26/15 1000  cefTRIAXone (ROCEPHIN) 2 g in dextrose 5 % 50 mL IVPB     2 g 100 mL/hr over 30 Minutes Intravenous Every 24 hours 11/26/15 0825     11/25/15 1400  cefTRIAXone (ROCEPHIN) 1 g in dextrose 5 % 50 mL IVPB     1 g 100 mL/hr over 30 Minutes Intravenous  Once 11/25/15 1358 11/25/15 1514        Subjective:   [name] was seen and examined today.  Alert and awake, oriented to self, and place. Still somewhat confused but seems  to be improving. Denies any pain. No fevers or chills today, no nausea or vomiting. No acute events overnight.    Objective:   Filed Vitals:   11/27/15 0405 11/27/15 0446 11/27/15 0600 11/27/15 0905  BP:   129/57 112/66  Pulse:  86 79 79  Temp:  98.5 F (36.9 C)  98.1 F (36.7 C)  TempSrc:  Oral  Oral  Resp:  24 22 31   Height:      Weight: 56.3 kg (124 lb 1.9 oz)     SpO2:  99% 93% 100%    Intake/Output Summary (Last 24 hours) at 11/27/15 1015 Last data filed at 11/27/15 0600  Gross per 24 hour  Intake 2746.67 ml  Output    651 ml  Net 2095.67 ml     Wt Readings from Last 3 Encounters:  11/27/15 56.3 kg (124 lb 1.9 oz)  05/24/15 52.9 kg (116 lb 10 oz)  07/01/13 64.456 kg (142 lb 1.6 oz)     Exam  General: Alert and oriented x 1, NAD  HEENT:  PERRLA, EOMI, Anicteric Sclera, mucous membranes moist.   Neck: Supple, no JVD, no masses  Cardiovascular: S1 S2 auscultated, no rubs, murmurs or gallops. Regular rate and rhythm.  Respiratory: Clear to auscultation bilaterally,  no wheezing, rales or rhonchi  Gastrointestinal: Soft, nontender, nondistended, + bowel sounds  Ext: no cyanosis clubbing or edema  Neuro: difficulty following commands, Strength 5/5 upper extremities. Did not follow commands for the lower extremities.  Skin: No rashes  Psych: somewhat confused   Data Reviewed:  I have personally reviewed following labs and imaging studies  Micro Results Recent Results (from the past 240 hour(s))  Blood Culture (routine x 2)     Status: None (Preliminary result)   Collection Time: 11/25/15  1:55 PM  Result Value Ref Range Status   Specimen Description BLOOD LEFT ANTECUBITAL  Final   Special Requests BOTTLES DRAWN AEROBIC AND ANAEROBIC 5CC  Final   Culture  Setup Time   Final    GRAM NEGATIVE RODS IN BOTH AEROBIC AND ANAEROBIC BOTTLES CRITICAL RESULT CALLED TO, READ BACK BY AND VERIFIED WITH: Maryann Alar AT 4098 11/26/15 BY L BENFIELD    Culture GRAM NEGATIVE RODS  Final   Report Status PENDING  Incomplete  Blood Culture (routine x 2)     Status: None (Preliminary result)   Collection Time: 11/25/15  2:25 PM  Result Value Ref Range Status   Specimen Description BLOOD RIGHT ANTECUBITAL  Final   Special Requests BOTTLES DRAWN AEROBIC AND ANAEROBIC 5CC  Final   Culture  Setup Time   Final    GRAM NEGATIVE RODS IN BOTH AEROBIC AND ANAEROBIC BOTTLES Organism ID to follow CRITICAL RESULT CALLED TO, READ BACK BY AND VERIFIED WITH: Maryann Alar AT 1191 11/26/15 BY L BENFIELD    Culture NO GROWTH 1 DAY  Final   Report Status PENDING  Incomplete  Blood Culture ID Panel (Reflexed)     Status: Abnormal   Collection Time: 11/25/15  2:25 PM  Result Value Ref Range Status   Enterococcus species NOT DETECTED NOT DETECTED Final   Vancomycin resistance NOT DETECTED NOT DETECTED Final   Listeria monocytogenes NOT DETECTED NOT DETECTED Final   Staphylococcus species NOT DETECTED NOT DETECTED Final   Staphylococcus aureus NOT DETECTED NOT DETECTED Final    Methicillin resistance NOT DETECTED NOT DETECTED Final   Streptococcus species NOT DETECTED NOT DETECTED Final   Streptococcus agalactiae NOT DETECTED NOT  DETECTED Final   Streptococcus pneumoniae NOT DETECTED NOT DETECTED Final   Streptococcus pyogenes NOT DETECTED NOT DETECTED Final   Acinetobacter baumannii NOT DETECTED NOT DETECTED Final   Enterobacteriaceae species NOT DETECTED NOT DETECTED Final   Enterobacter cloacae complex NOT DETECTED NOT DETECTED Final   Escherichia coli NOT DETECTED NOT DETECTED Final   Klebsiella oxytoca NOT DETECTED NOT DETECTED Final   Klebsiella pneumoniae NOT DETECTED NOT DETECTED Final   Proteus species DETECTED (A) NOT DETECTED Final    Comment: CRITICAL RESULT CALLED TO, READ BACK BY AND VERIFIED WITH: Maryann AlarJ FRENS,PHARM D AT [id number] 11/26/15 BY L BENFIELD    Serratia marcescens NOT DETECTED NOT DETECTED Final   Carbapenem resistance NOT DETECTED NOT DETECTED Final   Haemophilus influenzae NOT DETECTED NOT DETECTED Final   Neisseria meningitidis NOT DETECTED NOT DETECTED Final   Pseudomonas aeruginosa NOT DETECTED NOT DETECTED Final   Candida albicans NOT DETECTED NOT DETECTED Final   Candida glabrata NOT DETECTED NOT DETECTED Final   Candida krusei NOT DETECTED NOT DETECTED Final   Candida parapsilosis NOT DETECTED NOT DETECTED Final   Candida tropicalis NOT DETECTED NOT DETECTED Final  Urine culture     Status: Abnormal (Preliminary result)   Collection Time: 11/25/15  3:12 PM  Result Value Ref Range Status   Specimen Description URINE, CATHETERIZED  Final   Special Requests NONE  Final   Culture >=100,000 COLONIES/mL PROTEUS MIRABILIS (A)  Final   Report Status PENDING  Incomplete  MRSA PCR Screening     Status: None   Collection Time: 11/25/15  8:26 PM  Result Value Ref Range Status   MRSA by PCR NEGATIVE NEGATIVE Final    Comment:        The GeneXpert MRSA Assay (FDA approved for NASAL specimens only), is one component of a comprehensive MRSA  colonization surveillance program. It is not intended to diagnose MRSA infection nor to guide or monitor treatment for MRSA infections.     Radiology Reports Dg Chest 2 View  11/25/2015  CLINICAL DATA:  Vomiting today. Question aspiration. Initial encounter. EXAM: CHEST  2 VIEW COMPARISON:  Single-view of the chest 12/06/2014 and 08/31/2014. FINDINGS: The lungs are clear. Heart size is normal. No pneumothorax or pleural effusion. No focal bony abnormality. Aortic atherosclerosis noted. IMPRESSION: Negative for aspiration.  No acute disease. Electronically Signed   By: Drusilla Kannerhomas  Dalessio M.D.   On: 11/25/2015 14:22   Dg Abd Portable 1v  11/25/2015  CLINICAL DATA:  82 year old female with pain nausea vomiting. Patient was admitted for sepsis. EXAM: PORTABLE ABDOMEN - 1 VIEW COMPARISON:  Pelvic radiograph dated 12/06/2014 FINDINGS: Large amount of dense stool noted throughout the distal colon and rectosigmoid. There is no evidence of bowel obstruction. Right upper quadrant cholecystectomy clips. A 5 mm rounded density with eggshell calcification superimposed over the right iliac bone appears similar to prior study. There is scoliosis with advanced degenerative changes of the spine. There are osteoarthritic changes of the hip joints with narrowing of the femoroacetabular joint space. There are sclerotic changes of the femoral heads. No acute fracture. The soft tissues are grossly unremarkable. IMPRESSION: Constipation.  No evidence of bowel obstruction. Scoliosis, degenerative changes of the spine, and osteoarthritic changes of the hips. No acute fracture. Electronically Signed   By: Elgie CollardArash  Radparvar M.D.   On: 11/25/2015 21:40    Lab Data:  CBC:  Recent Labs Lab 11/25/15 1355 11/26/15 0320 11/27/15 0432  WBC 15.2* 26.7* 15.0*  NEUTROABS  --  23.2*  --   HGB 16.3* 11.5* 11.2*  HCT 50.0* 34.7* 33.3*  MCV 92.1 89.7 92.5  PLT 204 142* 126*   Basic Metabolic Panel:  Recent Labs Lab  11/25/15 1355 11/26/15 0320 11/27/15 0432  NA 147* 145 146*  K 3.5 3.0* 3.2*  CL 105 112* 114*  CO2 18* 19* 21*  GLUCOSE 223* 159* 91  BUN 23* 23* 23*  CREATININE 1.88* 0.86 0.63  CALCIUM 9.6 7.9* 8.0*   GFR: Estimated Creatinine Clearance: 39 mL/min (by C-G formula based on Cr of 0.63). Liver Function Tests:  Recent Labs Lab 11/25/15 1355 11/26/15 0320 11/27/15 0432  AST 46* 24 19  ALT 16 11* 11*  ALKPHOS 79 50 49  BILITOT 1.2 0.5 0.5  PROT 7.1 4.8* 4.8*  ALBUMIN 3.4* 2.1* 2.1*   No results for input(s): LIPASE, AMYLASE in the last 168 hours. No results for input(s): AMMONIA in the last 168 hours. Coagulation Profile: No results for input(s): INR, PROTIME in the last 168 hours. Cardiac Enzymes:  Recent Labs Lab 11/26/15 0320 11/26/15 0914 11/26/15 1553  TROPONINI 0.54* 0.41* 0.33*   BNP (last 3 results) No results for input(s): PROBNP in the last 8760 hours. HbA1C: No results for input(s): HGBA1C in the last 72 hours. CBG:  Recent Labs Lab 11/26/15 0602 11/26/15 1337 11/26/15 1808 11/26/15 2339 11/27/15 0637  GLUCAP 103* 92 119* 80 84   Lipid Profile: No results for input(s): CHOL, HDL, LDLCALC, TRIG, CHOLHDL, LDLDIRECT in the last 72 hours. Thyroid Function Tests: No results for input(s): TSH, T4TOTAL, FREET4, T3FREE, THYROIDAB in the last 72 hours. Anemia Panel: No results for input(s): VITAMINB12, FOLATE, FERRITIN, TIBC, IRON, RETICCTPCT in the last 72 hours. Urine analysis:    Component Value Date/Time   COLORURINE RED* 11/25/2015 1512   APPEARANCEUR TURBID* 11/25/2015 1512   LABSPEC 1.013 11/25/2015 1512   PHURINE 8.5* 11/25/2015 1512   GLUCOSEU NEGATIVE 11/25/2015 1512   HGBUR LARGE* 11/25/2015 1512   BILIRUBINUR NEGATIVE 11/25/2015 1512   KETONESUR 15* 11/25/2015 1512   PROTEINUR 100* 11/25/2015 1512   UROBILINOGEN 1.0 05/23/2015 1955   NITRITE POSITIVE* 11/25/2015 1512   LEUKOCYTESUR MODERATE* 11/25/2015 1512     RAI,RIPUDEEP  M.D. [facility] 11/27/2015, 10:15 AM  Pager: [phone] Between 7am to 7pm - call Pager - [phone]  After 7pm go to [email or web address] - password [email or web address]  Call night coverage person covering after 7pm

## 2015-11-27 NOTE — Progress Notes (Signed)
Blood noted on on pad after a bowel movement yesterday evening and this morning. Son concerned about her coughing and Dr. Isidoro Donningai paged and patient made NPO and transfer order cancelled.

## 2015-11-27 NOTE — Evaluation (Signed)
Clinical/Bedside Swallow Evaluation Patient Details  Name: [name] MRN: [id number] Date of Birth: 07/17/1921  Today's Date: 11/27/2015 Time: SLP Start Time (ACUTE ONLY): 0954 SLP Stop Time (ACUTE ONLY): 1008 SLP Time Calculation (min) (ACUTE ONLY): 14 min  Past Medical History:  Past Medical History  Diagnosis Date  . Dementia   . Arthritis    Past Surgical History:  Past Surgical History  Procedure Laterality Date  . Abdominal hysterectomy     HPI:  82 y.o. female with hx of Alzheimer's dementia admitted with severe sepsis, UTI.  Evaluated by SLP during 05/2015 admission and found to have normal swallow function; regular diet and thin liquids were recommended.    Assessment / Plan / Recommendation Clinical Impression  Pt presents with sufficient attention/alertness for safe PO consumption.  She expectorates pills and has prolonged mastication of solids; purees and thin liquids are tolerated well with no overt s/s of aspiration after their consumption.  Recommend continuing full liquid diet; crush meds in puree; SLP will follow briefly for safety/family education/diet advancement. Should be able to return to regular diet once MS returns to baseline.    Aspiration Risk  Mild aspiration risk    Diet Recommendation     Medication Administration: Crushed with puree    Other  Recommendations Oral Care Recommendations: Oral care BID   Follow up Recommendations  None    Frequency and Duration min 1 x/week  1 week       Prognosis Prognosis for Safe Diet Advancement: Good      Swallow Study   General Date of Onset: 11/26/15 HPI: 82 y.o. female with hx of Alzheimer's dementia admitted with severe sepsis, UTI.  Evaluated by SLP during 05/2015 admission and found to have normal swallow function; regular diet and thin liquids were recommended.  Type of Study: Bedside Swallow Evaluation Previous Swallow Assessment: november 2016 normal findings Diet Prior to this  Study:  (full liquids) Temperature Spikes Noted: No Respiratory Status: Room air History of Recent Intubation: No Behavior/Cognition: Alert;Requires cueing Oral Cavity Assessment: Within Functional Limits Oral Care Completed by SLP: No Vision: Functional for self-feeding Self-Feeding Abilities: Able to feed self;Needs assist Patient Positioning: Upright in bed Baseline Vocal Quality: Normal Volitional Cough: Cognitively unable to elicit Volitional Swallow: Unable to elicit    Oral/Motor/Sensory Function Overall Oral Motor/Sensory Function: Within functional limits   Ice Chips Ice chips: Within functional limits   Thin Liquid Thin Liquid: Within functional limits Presentation: Cup;Straw    Nectar Thick Nectar Thick Liquid: Not tested   Honey Thick Honey Thick Liquid: Not tested   Puree Puree: Within functional limits   Solid   GO   Solid: Impaired Oral Phase Impairments: Impaired mastication (mod cues to masticate cracker) Oral Phase Functional Implications: Oral holding        [name] [name] 11/27/2015,10:14 AM

## 2015-11-27 NOTE — Progress Notes (Signed)
  Echocardiogram 2D Echocardiogram has been performed.  [name], [name] 11/27/2015, 11:38 AM

## 2015-11-28 DIAGNOSIS — Z66 Do not resuscitate: Secondary | ICD-10-CM

## 2015-11-28 DIAGNOSIS — Z515 Encounter for palliative care: Secondary | ICD-10-CM

## 2015-11-28 LAB — BASIC METABOLIC PANEL
Anion gap: 11 (ref 5–15)
BUN: 16 mg/dL (ref 6–20)
CALCIUM: 7.8 mg/dL — AB (ref 8.9–10.3)
CHLORIDE: 115 mmol/L — AB (ref 101–111)
CO2: 21 mmol/L — AB (ref 22–32)
CREATININE: 0.68 mg/dL (ref 0.44–1.00)
GFR calc non Af Amer: >60 mL/min (ref >60)
Glucose, Bld: 81 mg/dL (ref 65–99)
Potassium: 3.2 mmol/L — ABNORMAL LOW (ref 3.5–5.1)
SODIUM: 147 mmol/L — AB (ref 135–145)

## 2015-11-28 LAB — URINE CULTURE: Culture: >=100000 — AB

## 2015-11-28 LAB — CBC
HCT: 33.8 % — ABNORMAL LOW (ref 36.0–46.0)
Hemoglobin: 11.3 g/dL — ABNORMAL LOW (ref 12.0–15.0)
MCH: 30.5 pg (ref 26.0–34.0)
MCHC: 33.4 g/dL (ref 30.0–36.0)
MCV: 91.1 fL (ref 78.0–100.0)
PLATELETS: 120 10*3/uL — AB (ref 150–400)
RBC: 3.71 MIL/uL — AB (ref 3.87–5.11)
RDW: 14.1 % (ref 11.5–15.5)
WBC: 11.1 10*3/uL — ABNORMAL HIGH (ref 4.0–10.5)

## 2015-11-28 LAB — GLUCOSE, CAPILLARY
GLUCOSE-CAPILLARY: 80 mg/dL (ref 65–99)
Glucose-Capillary: 86 mg/dL (ref 65–99)
Glucose-Capillary: 96 mg/dL (ref 65–99)
Glucose-Capillary: 131 mg/dL — ABNORMAL HIGH (ref 65–99)

## 2015-11-28 LAB — OCCULT BLOOD X 1 CARD TO LAB, STOOL: Fecal Occult Bld: NEGATIVE

## 2015-11-28 MED ORDER — CETYLPYRIDINIUM CHLORIDE 0.05 % MT LIQD
7.0000 mL | Freq: Two times a day (BID) | OROMUCOSAL | Status: DC
  Administered 2015-11-28 – 2015-11-30 (×5): 7 mL via OROMUCOSAL

## 2015-11-28 MED ORDER — POTASSIUM CHLORIDE 10 MEQ/100ML IV SOLN
10.0000 meq | INTRAVENOUS | Status: AC
  Administered 2015-11-28 (×3): 10 meq via INTRAVENOUS
  Filled 2015-11-28 (×3): qty 100

## 2015-11-28 MED ORDER — DEXTROSE-NACL 5-0.45 % IV SOLN
INTRAVENOUS | Status: DC
  Administered 2015-11-28 – 2015-11-30 (×2): via INTRAVENOUS
  Filled 2015-11-28 (×6): qty 1000

## 2015-11-28 NOTE — Evaluation (Signed)
Physical Therapy Evaluation Patient Details Name: [name] MRN: [id number] DOB: 1920/09/22 Today's Date: 11/28/2015   History of Present Illness  82 y.o. female with medical history significant of dementia was brought to the ER after patient became less responsive, found to have sepsis due to UTI in the ED. PCCM was consulted and patient was admitted to SDU.   Clinical Impression  Pt admitted with above diagnosis. Pt currently with functional limitations due to the deficits listed below (see PT Problem List). Pt had incr difficulty standing. Son is primary caregiver but cannot take pt home at current level until she gains some strength so SNF recommended.  Pt will benefit from skilled PT to increase their independence and safety with mobility to allow discharge to the venue listed below.      Follow Up Recommendations SNF;Supervision/Assistance - 24 hour    Equipment Recommendations  Other (comment) (tBA)    Recommendations for Other Services       Precautions / Restrictions Precautions Precautions: Fall Restrictions Weight Bearing Restrictions: No      Mobility  Bed Mobility Overal bed mobility: Needs Assistance;+2 for physical assistance Bed Mobility: Supine to Sit     Supine to sit: Mod assist;+2 for physical assistance     General bed mobility comments: Pt needed mod assist of 2 persons to come to EOB.  Pt leaning posteriorly.    Transfers Overall transfer level: Needs assistance Equipment used: Rolling walker (2 wheeled) Transfers: Sit to/from Stand Sit to Stand: Max assist;+2 physical assistance;From elevated surface         General transfer comment: Needed assist to power up.  Son states he usually helps pt up and puts her hands onb RW after she is up. Tried this technique but pt not really holding onto RW with hands and did not acheive full stand.  Pt leaning posterior significantly.  Pt also needed assist to block feet from sliding out.  Also noted  that pt had BM upon standing and took +3 assist as 2 persons held pt up and third person cleaned pt.    Ambulation/Gait                Stairs            Wheelchair Mobility    Modified Rankin (Stroke Patients Only)       Balance Overall balance assessment: Needs assistance;History of Falls Sitting-balance support: Bilateral upper extremity supported;Feet supported Sitting balance-Leahy Scale: Zero Sitting balance - Comments: pt requiring max assist to sit EOB with significant posterior lean.   Postural control: Posterior lean;Right lateral lean Standing balance support: Bilateral upper extremity supported;During functional activity Standing balance-Leahy Scale: Zero Standing balance comment: could not stand even with +2 assist and RW                             Pertinent Vitals/Pain Pain Assessment: Faces Faces Pain Scale: Hurts even more Pain Location: generalized Pain Descriptors / Indicators: Aching;Grimacing;Guarding Pain Intervention(s): Limited activity within patient's tolerance;Monitored during session;Repositioned  VSS    Home Living Family/patient expects to be discharged to:: Private residence Living Arrangements: Children Available Help at Discharge: Family;Available 24 hours/day Type of Home: House Home Access: Stairs to enter Entrance Stairs-Rails: Right Entrance Stairs-Number of Steps: 3 Home Layout: One level Home Equipment: Walker - standard;Shower seat (bars on toilet)      Prior Function Level of Independence: Needs assistance   Gait / Transfers Assistance  Needed: min guard assist with SW- son always stays with her  ADL's / Homemaking Assistance Needed: total assist bathing and dressing, feeds herself, wore Depends        Hand Dominance        Extremity/Trunk Assessment   Upper Extremity Assessment: Defer to OT evaluation           Lower Extremity Assessment: Generalized weakness      Cervical / Trunk  Assessment: Kyphotic  Communication   Communication: No difficulties  Cognition Arousal/Alertness: Awake/alert Behavior During Therapy: Anxious Overall Cognitive Status: History of cognitive impairments - at baseline Area of Impairment: Orientation;Following commands;Safety/judgement;Awareness;Problem solving Orientation Level: Disoriented to;Time;Situation;Place   Memory: Decreased short-term memory Following Commands: Follows one step commands inconsistently Safety/Judgement: Decreased awareness of safety Awareness: Intellectual Problem Solving: Slow processing;Difficulty sequencing;Requires verbal cues;Requires tactile cues General Comments: confused    General Comments      Exercises        Assessment/Plan    PT Assessment Patient needs continued PT services  PT Diagnosis Generalized weakness;Acute pain   PT Problem List Decreased activity tolerance;Decreased balance;Decreased strength;Decreased mobility;Decreased knowledge of use of DME;Decreased safety awareness;Decreased knowledge of precautions;Pain  PT Treatment Interventions DME instruction;Gait training;Functional mobility training;Therapeutic activities;Therapeutic exercise;Balance training;Patient/family education;Cognitive remediation   PT Goals (Current goals can be found in the Care Plan section) Acute Rehab PT Goals Patient Stated Goal: to go home with son PT Goal Formulation: With family Time For Goal Achievement: 12/12/15 Potential to Achieve Goals: Good    Frequency Min 2X/week   Barriers to discharge        Co-evaluation               End of Session Equipment Utilized During Treatment: Gait belt Activity Tolerance: Patient limited by fatigue Patient left: in bed;with call bell/phone within reach;with bed alarm set;with family/visitor present Nurse Communication: Mobility status;Need for lift equipment         Time: 1009-1033 PT Time Calculation (min) (ACUTE ONLY): 24 min   Charges:    PT Evaluation $PT Eval Moderate Complexity: 1 Procedure PT Treatments $Self Care/Home Management: 8-22   PT G Codes:        Marcellis Frampton F 2015-12-14, 1:21 PM Denis Koppel,PT Acute Rehabilitation [phone] (pager)

## 2015-11-28 NOTE — Progress Notes (Signed)
Speech Language Pathology Treatment: Dysphagia  Patient Details Name: [name] MRN: [id number] DOB: Sep 19, 1920 Today's Date: 11/28/2015 Time: 3968-8648 SLP Time Calculation (min) (ACUTE ONLY): 21 min  Assessment / Plan / Recommendation Clinical Impression  F/u after yesterday's swallow evaluation.  Pt coughing last night; son was concerned so pt was made NPO.  Reassessed today.  Pt has remarkably strong swallow given her advanced dementia - she is masticating solids safely; swallow response is timely; she consumes large, successive boluses of thin liquids with only one noted incident of throat-clearing.  Discussed at length with son the eventual impact of dementia upon swallowing.  He understands that dysphagia with aspiration may eventually become problematic, but that for now, pt is really swallowing quite well.  He would like to advance diet back to regular with thin liquids.  Occasional coughing may be observed but shouldn't be cause for alarm.  No further SLP f/u is needed - our services will sign off.    HPI HPI: 82 y.o. female with hx of Alzheimer's dementia admitted with severe sepsis, UTI.  Evaluated by SLP during 05/2015 admission and found to have normal swallow function; regular diet and thin liquids were recommended.       SLP Plan  All goals met     Recommendations  Diet recommendations: Regular;Thin liquid Liquids provided via: Cup;Straw Medication Administration: Crushed with puree Supervision: Staff to assist with self feeding             Oral Care Recommendations: Oral care BID Follow up Recommendations: None Plan: All goals met     GO               [name] L. Tivis Ringer, [place] CCC/SLP Pager [phone]  [name] 11/28/2015, 12:11 PM

## 2015-11-28 NOTE — Progress Notes (Signed)
Called report to Maralyn SagoSarah RN on [address] pt will be coming in the bed. Family aware of transfer to [address] bed 21

## 2015-11-28 NOTE — Consult Note (Signed)
Consultation Note Date: 11/28/2015   Patient Name: [name]  DOB: [birth year]  MRN: [id number]  Age / Sex: 82 y.o., female  PCP: Pcp Not In System Referring Physician: Cathren Harsh, MD  Reason for Consultation: Establishing goals of care and Psychosocial/spiritual support  HPI/Patient Profile: 82 y.o. female   admitted on 11/25/2015 with past  medical history significant of dementia was brought to the ER after patient became less responsive. Per patient's son patient has been having some abdominal discomfort over the last 3-4 days.  Patient developed some fever and chills.  . Following which patient became more lethargic and less responsive and patient was brought to the ER.   In the ER patient was found to be hypotensive and febrile with labs showing markedly elevated lactic acid at 12. UA shows features consistent with UTI. Abdomen on exam was benign. Chest x-ray was unremarkable. Admitted for treatment and stablization  Continued slow physical, functional and cognitive decline 2/2 to dementia.   Son is main caregiver, lives at home with mother and there is hired nursing support during the week.  Faced with advanced directive decisions and anticipatory care needs  Clinical Assessment and Goals of Care:    This NP Lorinda Creed reviewed medical records, received report from team, assessed the patient and then meet at the patient's bedside along with her son  to discuss diagnosis, prognosis, GOC, EOL wishes disposition and options.   A detailed discussion was had today regarding advanced directives.  Concepts specific to code status, artifical feeding and hydration, continued IV antibiotics and rehospitalization was had.  The difference between a aggressive medical intervention path  and a palliative comfort care path for this patient at this time was had.  Values and goals of care important to  patient and family were attempted to be elicited.  Concept of Hospice and Palliative Care were discussed  Natural trajectory and expectations at EOL were discussed.  Questions and concerns addressed.  Hard Choices booklet left for review. Family encouraged to call with questions or concerns.  PMT will continue to support holistically.  MOST form introduced   SUMMARY OF RECOMMENDATIONS    - Per son, treat the treatable, hopeful for improvement.  Open to possibility of SNF for rehabilitation on discharge with ultimate goal of returning home.   Aware of hospice benefit and services   Code Status/Advance Care Planning:  DNR    Symptom Management:   Weakness: PT/OT  And SNF on discharge  Palliative Prophylaxis:   Aspiration, Bowel Regimen, Delirium Protocol, Frequent Pain Assessment and Oral Care   Psycho-social/Spiritual:   Desire for further Chaplaincy support:no- declines at this time  Additional Recommendations: Education on Hospice  Prognosis:   Unable to determine  Discharge Planning: To Be Determined      Primary Diagnoses: Present on Admission:  . AKI (acute kidney injury) (HCC) . Severe sepsis (HCC) . UTI (urinary tract infection) . Acute encephalopathy . Alzheimer's dementia . Proteus septicemia (HCC)  I have reviewed the medical record, interviewed  the patient and family, and examined the patient. The following aspects are pertinent.  Past Medical History  Diagnosis Date  . Dementia   . Arthritis    Social History   Social History  . Marital Status: Widowed    Spouse Name: N/A  . Number of Children: N/A  . Years of Education: N/A   Social History Main Topics  . Smoking status: Never Smoker   . Smokeless tobacco: None  . Alcohol Use: No  . Drug Use: None  . Sexual Activity: No   Other Topics Concern  . None   Social History Narrative   Family History  Problem Relation Age of Onset  . Hypertension Mother   . Hypertension Father      Scheduled Meds: . antiseptic oral rinse  7 mL Mouth Rinse BID  . cefTRIAXone (ROCEPHIN)  IV  2 g Intravenous Q24H  . cholecalciferol  1,000 Units Oral Q1500  . docusate sodium  100 mg Oral Q1500  . donepezil  10 mg Oral Q1500  . memantine  5 mg Oral Q1500  . potassium chloride  10 mEq Intravenous Q1 Hr x 3  . potassium chloride  40 mEq Oral Once   Continuous Infusions: . dextrose 5 % and 0.45% NaCl 1,000 mL with potassium chloride 20 mEq infusion 50 mL/hr at 11/28/15 0945   PRN Meds:.acetaminophen **OR** acetaminophen, ondansetron **OR** ondansetron (ZOFRAN) IV Medications Prior to Admission:  Prior to Admission medications   Medication Sig Start Date End Date Taking? Authorizing Provider  cholecalciferol (VITAMIN D) 1000 units tablet Take 1,000 Units by mouth daily at 3 pm.   Yes Historical Provider, MD  docusate sodium (COLACE) 100 MG capsule Take 100 mg by mouth daily at 3 pm.    Yes Historical Provider, MD  donepezil (ARICEPT) 10 MG tablet Take 10 mg by mouth daily at 3 pm.    Yes Historical Provider, MD  memantine (NAMENDA) 5 MG tablet Take 5 mg by mouth daily at 3 pm.    Yes Historical Provider, MD   No Known Allergies Review of Systems  Unable to perform ROS: Dementia    Physical Exam  Constitutional: She appears lethargic. She appears cachectic.  Cardiovascular: Normal rate, regular rhythm and normal heart sounds.   Pulmonary/Chest: She has decreased breath sounds in the right lower field and the left lower field.  Musculoskeletal:  -generalized weakness, muscle atrophy  Neurological: She appears lethargic.  Skin: Skin is warm and dry.  Psychiatric:  - confused, nonverbal    Vital Signs: BP 123/74 mmHg  Pulse 81  Temp(Src) 98.9 F (37.2 C) (Axillary)  Resp 29  Ht  (1.676 m)  Wt 57.2 kg (126 lb 1.7 oz)  BMI 20.36 kg/m2  SpO2 96% Pain Assessment: No/denies pain       SpO2: SpO2: 96 % O2 Device:SpO2: 96 % O2 Flow Rate: .   IO: Intake/output  summary:  Intake/Output Summary (Last 24 hours) at 11/28/15 1058 Last data filed at 11/28/15 0434  Gross per 24 hour  Intake 1312.5 ml  Output    695 ml  Net  617.5 ml    LBM: Last BM Date: 11/26/15 Baseline Weight: Weight: 49.896 kg (110 lb) Most recent weight: Weight: 57.2 kg (126 lb 1.7 oz)      Palliative Assessment/Data: 30 % at best   Flowsheet Rows        Most Recent Value   Intake Tab    Referral Department  Hospitalist   Unit  at Time of Referral  Intermediate Care Unit   Palliative Care Primary Diagnosis  Sepsis/Infectious Disease   Date Notified  11/28/15   Palliative Care Type  New Palliative care   Reason for referral  Clarify Goals of Care   Date of Admission  11/25/15   # of days IP prior to Palliative referral  3   Clinical Assessment    Psychosocial & Spiritual Assessment    Palliative Care Outcomes       Paged Dr Isidoro Donningai  Time In: 1300 Time Out: 1415 Time Total: 75 min Greater than 50%  of this time was spent counseling and coordinating care related to the above assessment and plan.  Signed by: Lorinda CreedLARACH, MARY, NP   Please contact Palliative Medicine Team phone at [phone] for questions and concerns.  For individual provider: See Loretha StaplerAmion

## 2015-11-28 NOTE — Progress Notes (Signed)
NURSING PROGRESS NOTE  [name] Rudlizabeth L Mentink [id number] Transfer Data: 11/28/2015 8:01 PM Attending Provider: Cathren Harshipudeep K Rai, MD PCP:Pcp Not In System Code Status: DNR   [name] is a 82 y.o. female patient transferred from 2 [address]  -No acute distress noted.  -No complaints of shortness of breath.  -No complaints of chest pain.   Cardiac Monitoring: Box # 21 in place. Cardiac monitor yields:normal sinus rhythm.  Last Documented Vital Signs: Blood pressure 147/78, pulse 99, temperature 98.7 F (37.1 C), temperature source Oral, resp. rate 18, height 5\' 6"  (1.676 m), weight 61.8 kg (136 lb 3.9 oz), SpO2 96 %.  IV Fluids:  IV in place, occlusive dsg intact without redness, IV cath forearm left, condition patent and no redness D5W/0.45 NaCl. 20K  Allergies:  Review of patient's allergies indicates no known allergies.  Past Medical History:   has a past medical history of Dementia and Arthritis.  Past Surgical History:   has past surgical history that includes Abdominal hysterectomy.  Social History:   reports that she has never smoked. She does not have any smokeless tobacco history on file. She reports that she does not drink alcohol.  Skin: intact  Patient/Family orientated to room. Information packet given to patient/family. Admission inpatient armband information verified with patient/family to include name and date of birth and placed on patient arm. Side rails up x 2, fall assessment and education completed with patient/family. Patient/family able to verbalize understanding of risk associated with falls and verbalized understanding to call for assistance before getting out of bed. Call light within reach. Patient/family able to voice and demonstrate understanding of unit orientation instructions.

## 2015-11-28 NOTE — Progress Notes (Signed)
[facility]                                                                              Patient Demographics  [name], is a 82 y.o. female, DOB - [birth year], QQV:[id number]  Admit date - 11/25/2015   Admitting Physician Eduard Clos, MD  Outpatient Primary MD for the patient is Pcp Not In System  Outpatient specialists:   LOS - 3  days    Chief Complaint  Patient presents with  . Altered Mental Status       Brief summary   82 y.o. female with medical history significant of dementia was brought to the ER after patient became less responsive, found to have sepsis due to UTI in the ED. PCCM was consulted and patient was admitted to SDU.    Assessment & Plan   Severe sepsis from Proteus mirabilis UTI with Proteus Bacteremia - sepsis physiology improving, leukocytosis improving. Afebrile - continue Ceftriaxone, IV fluids. Will change to oral antibiotics for total of 2 weeks at the time of discharge, if patient is consistently eating. - Lactic acid improved  Acute encephalopathy secondary to #1, In the setting of advanced dementia - - Continue IV antibiotics, gentle hydration  Acute renal failure  - Resolved, probably from hypotension and nausea vomiting.  - Continue gentle hydration  Dementia  - Continue Aricept, Namenda   Elevated troponin  - probably secondary to hypotension and sepsis. EKG does not show anything acute.  - Troponins trending down - 2-D echo showed EF of 50-55%, grade 2 diastolic dysfunction  Dysphagia, aspiration, secondary to dementia, failure to thrive -Speech evaluation done, started on regular diet - Palliative consult placed for goals of care  Hypokalemia, hypernatremia - Placed on gentle hydration with D5 with potassium supplementation  Anemia - Likely anemia of chronic disease - Patient had small amount of blood on the pad yesterday however per nursing staff, the bowel movements do not have any blood.  H&H remained stable. Likely due to hemorrhoids. FOBT also ordered. Unless severe drop in the hemoglobin or obvious bleeding, conservative management recommended given her age and comorbidities.  Code Status: dnr  DVT Prophylaxis:  Lovenox   Family Communication:  Called patient's son, unable to contact, left a detailed message yesterday.  Disposition Plan: Transfer to telemetry  Time Spent in minutes   25 minutes  Procedures:  None   Consultants:   PCCM  Antimicrobials:   Rocephin started 11/26/15   Medications  Scheduled Meds: . antiseptic oral rinse  7 mL Mouth Rinse BID  . cefTRIAXone (ROCEPHIN)  IV  2 g Intravenous Q24H  . cholecalciferol  1,000 Units Oral Q1500  . docusate sodium  100 mg Oral Q1500  . donepezil  10 mg Oral Q1500  . memantine  5 mg Oral Q1500  . potassium chloride  40 mEq Oral Once   Continuous Infusions: . dextrose 5 % and 0.45% NaCl 1,000 mL with potassium chloride 20 mEq infusion 50 mL/hr at 11/28/15 0945   PRN Meds:.acetaminophen **OR** acetaminophen, ondansetron **OR** ondansetron (ZOFRAN) IV   Antibiotics   Anti-infectives    Start  Dose/Rate Route Frequency Ordered Stop   11/26/15 1300  cefTRIAXone (ROCEPHIN) 1 g in dextrose 5 % 50 mL IVPB  Status:  Discontinued     1 g 100 mL/hr over 30 Minutes Intravenous Every 24 hours 11/25/15 2025 11/26/15 0825   11/26/15 1000  cefTRIAXone (ROCEPHIN) 2 g in dextrose 5 % 50 mL IVPB     2 g 100 mL/hr over 30 Minutes Intravenous Every 24 hours 11/26/15 0825     11/25/15 1400  cefTRIAXone (ROCEPHIN) 1 g in dextrose 5 % 50 mL IVPB     1 g 100 mL/hr over 30 Minutes Intravenous  Once 11/25/15 1358 11/25/15 1514        Subjective:   [name] was seen and examined today. Denies any specific complaints, confused. Denies any pain. No fevers or chills today, no nausea or vomiting. No acute events overnight.    Objective:   Filed Vitals:   11/28/15 0600 11/28/15 0601 11/28/15 0752 11/28/15  1217  BP: 136/84  123/74 154/90  Pulse: 81  81 88  Temp:   98.9 F (37.2 C) 98 F (36.7 C)  TempSrc:   Axillary Oral  Resp: 29  29 27   Height:      Weight:  57.2 kg (126 lb 1.7 oz)    SpO2: 98%  96% 99%    Intake/Output Summary (Last 24 hours) at 11/28/15 1218 Last data filed at 11/28/15 1200  Gross per 24 hour  Intake 2412.5 ml  Output    695 ml  Net 1717.5 ml     Wt Readings from Last 3 Encounters:  11/28/15 57.2 kg (126 lb 1.7 oz)  05/24/15 52.9 kg (116 lb 10 oz)  07/01/13 64.456 kg (142 lb 1.6 oz)     Exam  General: Alert and oriented x 1, NAD  HEENT:    Neck:  Cardiovascular: S1 S2 auscultated, no rubs, murmurs or gallops. Regular rate and rhythm.  Respiratory: Clear to auscultation bilaterally, no wheezing, rales or rhonchi  Gastrointestinal: Soft, nontender, nondistended, + bowel sounds  Ext: no cyanosis clubbing or edema  Neuro: did not follow commands  Skin: No rashes  Psych: somewhat confused   Data Reviewed:  I have personally reviewed following labs and imaging studies  Micro Results Recent Results (from the past 240 hour(s))  Blood Culture (routine x 2)     Status: Abnormal (Preliminary result)   Collection Time: 11/25/15  1:55 PM  Result Value Ref Range Status   Specimen Description BLOOD LEFT ANTECUBITAL  Final   Special Requests BOTTLES DRAWN AEROBIC AND ANAEROBIC 5CC  Final   Culture  Setup Time   Final    GRAM NEGATIVE RODS IN BOTH AEROBIC AND ANAEROBIC BOTTLES CRITICAL RESULT CALLED TO, READ BACK BY AND VERIFIED WITH: Maryann Alar AT 1610 11/26/15 BY L BENFIELD    Culture PROTEUS MIRABILIS SUSCEPTIBILITIES TO FOLLOW  (A)  Final   Report Status PENDING  Incomplete  Blood Culture (routine x 2)     Status: Abnormal (Preliminary result)   Collection Time: 11/25/15  2:25 PM  Result Value Ref Range Status   Specimen Description BLOOD RIGHT ANTECUBITAL  Final   Special Requests BOTTLES DRAWN AEROBIC AND ANAEROBIC 5CC  Final    Culture  Setup Time   Final    GRAM NEGATIVE RODS IN BOTH AEROBIC AND ANAEROBIC BOTTLES Organism ID to follow CRITICAL RESULT CALLED TO, READ BACK BY AND VERIFIED WITH: Peterson Ao D AT 9604 11/26/15 BY L BENFIELD  Culture PROTEUS MIRABILIS SUSCEPTIBILITIES TO FOLLOW  (A)  Final   Report Status PENDING  Incomplete  Blood Culture ID Panel (Reflexed)     Status: Abnormal   Collection Time: 11/25/15  2:25 PM  Result Value Ref Range Status   Enterococcus species NOT DETECTED NOT DETECTED Final   Vancomycin resistance NOT DETECTED NOT DETECTED Final   Listeria monocytogenes NOT DETECTED NOT DETECTED Final   Staphylococcus species NOT DETECTED NOT DETECTED Final   Staphylococcus aureus NOT DETECTED NOT DETECTED Final   Methicillin resistance NOT DETECTED NOT DETECTED Final   Streptococcus species NOT DETECTED NOT DETECTED Final   Streptococcus agalactiae NOT DETECTED NOT DETECTED Final   Streptococcus pneumoniae NOT DETECTED NOT DETECTED Final   Streptococcus pyogenes NOT DETECTED NOT DETECTED Final   Acinetobacter baumannii NOT DETECTED NOT DETECTED Final   Enterobacteriaceae species NOT DETECTED NOT DETECTED Final   Enterobacter cloacae complex NOT DETECTED NOT DETECTED Final   Escherichia coli NOT DETECTED NOT DETECTED Final   Klebsiella oxytoca NOT DETECTED NOT DETECTED Final   Klebsiella pneumoniae NOT DETECTED NOT DETECTED Final   Proteus species DETECTED (A) NOT DETECTED Final    Comment: CRITICAL RESULT CALLED TO, READ BACK BY AND VERIFIED WITH: Maryann Alar AT 1610 11/26/15 BY L BENFIELD    Serratia marcescens NOT DETECTED NOT DETECTED Final   Carbapenem resistance NOT DETECTED NOT DETECTED Final   Haemophilus influenzae NOT DETECTED NOT DETECTED Final   Neisseria meningitidis NOT DETECTED NOT DETECTED Final   Pseudomonas aeruginosa NOT DETECTED NOT DETECTED Final   Candida albicans NOT DETECTED NOT DETECTED Final   Candida glabrata NOT DETECTED NOT DETECTED Final    Candida krusei NOT DETECTED NOT DETECTED Final   Candida parapsilosis NOT DETECTED NOT DETECTED Final   Candida tropicalis NOT DETECTED NOT DETECTED Final  Urine culture     Status: Abnormal   Collection Time: 11/25/15  3:12 PM  Result Value Ref Range Status   Specimen Description URINE, CATHETERIZED  Final   Special Requests NONE  Final   Culture >=100,000 COLONIES/mL PROTEUS MIRABILIS (A)  Final   Report Status 11/28/2015 FINAL  Final   Organism ID, Bacteria PROTEUS MIRABILIS (A)  Final      Susceptibility   Proteus mirabilis - MIC*    AMPICILLIN <=2 SENSITIVE Sensitive     CEFAZOLIN <=4 SENSITIVE Sensitive     CEFTRIAXONE <=1 SENSITIVE Sensitive     CIPROFLOXACIN <=0.25 SENSITIVE Sensitive     GENTAMICIN <=1 SENSITIVE Sensitive     IMIPENEM 4 SENSITIVE Sensitive     NITROFURANTOIN 128 RESISTANT Resistant     TRIMETH/SULFA <=20 SENSITIVE Sensitive     AMPICILLIN/SULBACTAM <=2 SENSITIVE Sensitive     PIP/TAZO <=4 SENSITIVE Sensitive     * >=100,000 COLONIES/mL PROTEUS MIRABILIS  MRSA PCR Screening     Status: None   Collection Time: 11/25/15  8:26 PM  Result Value Ref Range Status   MRSA by PCR NEGATIVE NEGATIVE Final    Comment:        The GeneXpert MRSA Assay (FDA approved for NASAL specimens only), is one component of a comprehensive MRSA colonization surveillance program. It is not intended to diagnose MRSA infection nor to guide or monitor treatment for MRSA infections.     Radiology Reports Dg Chest 2 View  11/25/2015  CLINICAL DATA:  Vomiting today. Question aspiration. Initial encounter. EXAM: CHEST  2 VIEW COMPARISON:  Single-view of the chest 12/06/2014 and 08/31/2014. FINDINGS: The lungs are clear. Heart  size is normal. No pneumothorax or pleural effusion. No focal bony abnormality. Aortic atherosclerosis noted. IMPRESSION: Negative for aspiration.  No acute disease. Electronically Signed   By: Drusilla Kannerhomas  Dalessio M.D.   On: 11/25/2015 14:22   Dg Chest Port 1  View  11/27/2015  CLINICAL DATA:  Cough, possible aspiration EXAM: PORTABLE CHEST 1 VIEW COMPARISON:  11/25/2015 FINDINGS: Soft tissue overlies the right lung base. Mild bibasilar opacities, possibly atelectasis, although pneumonia is not excluded, particularly in the right lower lobe. No definite pleural effusion or pneumothorax. The heart is normal in size. IMPRESSION: Mild bibasilar opacities, possibly atelectasis, although right lower lobe pneumonia is not excluded. Electronically Signed   By: Charline BillsSriyesh  Krishnan M.D.   On: 11/27/2015 13:43   Dg Abd Portable 1v  11/25/2015  CLINICAL DATA:  82 year old female with pain nausea vomiting. Patient was admitted for sepsis. EXAM: PORTABLE ABDOMEN - 1 VIEW COMPARISON:  Pelvic radiograph dated 12/06/2014 FINDINGS: Large amount of dense stool noted throughout the distal colon and rectosigmoid. There is no evidence of bowel obstruction. Right upper quadrant cholecystectomy clips. A 5 mm rounded density with eggshell calcification superimposed over the right iliac bone appears similar to prior study. There is scoliosis with advanced degenerative changes of the spine. There are osteoarthritic changes of the hip joints with narrowing of the femoroacetabular joint space. There are sclerotic changes of the femoral heads. No acute fracture. The soft tissues are grossly unremarkable. IMPRESSION: Constipation.  No evidence of bowel obstruction. Scoliosis, degenerative changes of the spine, and osteoarthritic changes of the hips. No acute fracture. Electronically Signed   By: Elgie CollardArash  Radparvar M.D.   On: 11/25/2015 21:40    Lab Data:  CBC:  Recent Labs Lab 11/25/15 1355 11/26/15 0320 11/27/15 0432 11/28/15 0348  WBC 15.2* 26.7* 15.0* 11.1*  NEUTROABS  --  23.2*  --   --   HGB 16.3* 11.5* 11.2* 11.3*  HCT 50.0* 34.7* 33.3* 33.8*  MCV 92.1 89.7 92.5 91.1  PLT 204 142* 126* 120*   Basic Metabolic Panel:  Recent Labs Lab 11/25/15 1355 11/26/15 0320  11/27/15 0432 11/28/15 0348  NA 147* 145 146* 147*  K 3.5 3.0* 3.2* 3.2*  CL 105 112* 114* 115*  CO2 18* 19* 21* 21*  GLUCOSE 223* 159* 91 81  BUN 23* 23* 23* 16  CREATININE 1.88* 0.86 0.63 0.68  CALCIUM 9.6 7.9* 8.0* 7.8*   GFR: Estimated Creatinine Clearance: 39.7 mL/min (by C-G formula based on Cr of 0.68). Liver Function Tests:  Recent Labs Lab 11/25/15 1355 11/26/15 0320 11/27/15 0432  AST 46* 24 19  ALT 16 11* 11*  ALKPHOS 79 50 49  BILITOT 1.2 0.5 0.5  PROT 7.1 4.8* 4.8*  ALBUMIN 3.4* 2.1* 2.1*   No results for input(s): LIPASE, AMYLASE in the last 168 hours. No results for input(s): AMMONIA in the last 168 hours. Coagulation Profile: No results for input(s): INR, PROTIME in the last 168 hours. Cardiac Enzymes:  Recent Labs Lab 11/26/15 0320 11/26/15 0914 11/26/15 1553  TROPONINI 0.54* 0.41* 0.33*   BNP (last 3 results) No results for input(s): PROBNP in the last 8760 hours. HbA1C: No results for input(s): HGBA1C in the last 72 hours. CBG:  Recent Labs Lab 11/27/15 0637 11/27/15 1312 11/27/15 1745 11/28/15 0006 11/28/15 0559  GLUCAP 84 133* 85 80 86   Lipid Profile: No results for input(s): CHOL, HDL, LDLCALC, TRIG, CHOLHDL, LDLDIRECT in the last 72 hours. Thyroid Function Tests: No results for input(s): TSH, T4TOTAL,  FREET4, T3FREE, THYROIDAB in the last 72 hours. Anemia Panel:  Recent Labs  11/27/15 1326  VITAMINB12 269  FOLATE 7.2  FERRITIN 64  TIBC 146*  IRON 12*  RETICCTPCT 1.3   Urine analysis:    Component Value Date/Time   COLORURINE RED* 11/25/2015 1512   APPEARANCEUR TURBID* 11/25/2015 1512   LABSPEC 1.013 11/25/2015 1512   PHURINE 8.5* 11/25/2015 1512   GLUCOSEU NEGATIVE 11/25/2015 1512   HGBUR LARGE* 11/25/2015 1512   BILIRUBINUR NEGATIVE 11/25/2015 1512   KETONESUR 15* 11/25/2015 1512   PROTEINUR 100* 11/25/2015 1512   UROBILINOGEN 1.0 05/23/2015 1955   NITRITE POSITIVE* 11/25/2015 1512   LEUKOCYTESUR MODERATE*  11/25/2015 1512     Davanta Meuser M.D. [facility] 11/28/2015, 12:18 PM  Pager: [phone] Between 7am to 7pm - call Pager - [phone]  After 7pm go to [email or web address] - password [email or web address]  Call night coverage person covering after 7pm

## 2015-11-28 NOTE — Progress Notes (Signed)
Pt transferred per bed with all belongings by RN and NT . Pt has full dentures upper and lowers in her mouth.

## 2015-11-28 NOTE — Progress Notes (Signed)
Pt has had two bowel movements with soft, brown stool with a light red stain on the pad.  Most likely hemorrhoids?  No signs of active bleeding, stool has no red in it, RN reassured son that it's most likely hemorrhoids. RN will continue to monitor.

## 2015-11-28 NOTE — Progress Notes (Signed)
Attempted report 

## 2015-11-29 LAB — BASIC METABOLIC PANEL
Anion gap: 11 (ref 5–15)
BUN: 14 mg/dL (ref 6–20)
CHLORIDE: 110 mmol/L (ref 101–111)
CO2: 22 mmol/L (ref 22–32)
Calcium: 8 mg/dL — ABNORMAL LOW (ref 8.9–10.3)
Creatinine, Ser: 0.51 mg/dL (ref 0.44–1.00)
GFR calc non Af Amer: >60 mL/min (ref >60)
Glucose, Bld: 115 mg/dL — ABNORMAL HIGH (ref 65–99)
POTASSIUM: 3.6 mmol/L (ref 3.5–5.1)
SODIUM: 143 mmol/L (ref 135–145)

## 2015-11-29 LAB — CULTURE, BLOOD (ROUTINE X 2)

## 2015-11-29 LAB — CBC
HEMATOCRIT: 34.3 % — AB (ref 36.0–46.0)
Hemoglobin: 11.4 g/dL — ABNORMAL LOW (ref 12.0–15.0)
MCH: 29.2 pg (ref 26.0–34.0)
MCHC: 33.2 g/dL (ref 30.0–36.0)
MCV: 87.9 fL (ref 78.0–100.0)
Platelets: 136 10*3/uL — ABNORMAL LOW (ref 150–400)
RBC: 3.9 MIL/uL (ref 3.87–5.11)
RDW: 13.8 % (ref 11.5–15.5)
WBC: 8.9 10*3/uL (ref 4.0–10.5)

## 2015-11-29 LAB — GLUCOSE, CAPILLARY
GLUCOSE-CAPILLARY: 96 mg/dL (ref 65–99)
Glucose-Capillary: 118 mg/dL — ABNORMAL HIGH (ref 65–99)

## 2015-11-29 MED ORDER — AMOXICILLIN-POT CLAVULANATE 875-125 MG PO TABS: 1.0000 | ORAL_TABLET | Freq: Two times a day (BID) | ORAL | Status: DC

## 2015-11-29 NOTE — Progress Notes (Signed)
CSW spoke with patient's son again to confirm SNF choice. Patient's son now would like to take patient home even if she is on IV antibiotics. He expressed really not wanting to put his mother anywhere but home and felt confident that he could continue caring for her.  CSW signing off.  Osborne Cascoadia Fiza Nation LCSWA [phone]

## 2015-11-29 NOTE — Care Management Important Message (Signed)
Important Message  Patient Details  Name: [name] MRN: [id number] Date of Birth: 09-29-1920   Medicare Important Message Given:  Yes    Kyla BalzarineShealy, Trust Crago Abena 11/29/2015, 11:54 AM

## 2015-11-29 NOTE — NC FL2 (Signed)
[place] MEDICAID FL2 LEVEL OF CARE SCREENING TOOL     IDENTIFICATION  Patient Name: [name] Birthdate: June 02, 1921 Sex: female Admission Date (Current Location): 11/25/2015  [facility] and [place] Number:  Producer, television/film/video and Address:  [facility], [address], [place], [place] [address]      Provider Number: [id number]  Attending Physician Name and Address:  Cathren Harsh, MD  Relative Name and Phone Number:  Augusto Gamble, son, [phone]    Current Level of Care: Hospital Recommended Level of Care: Skilled Nursing Facility Prior Approval Number:    Date Approved/Denied:   PASRR Number:    Discharge Plan: SNF    Current Diagnoses: Patient Active Problem List   Diagnosis Date Noted  . Palliative care encounter 11/28/2015  . DNR (do not resuscitate) 11/28/2015  . Proteus septicemia (HCC) 11/26/2015  . Septic shock (HCC)   . AKI (acute kidney injury) (HCC) 11/25/2015  . Severe sepsis (HCC) 11/25/2015  . UTI (urinary tract infection) 05/24/2015  . Dementia 05/23/2015  . Hyperglycemia 05/23/2015  . Acute encephalopathy 08/31/2014  . Fall at home 07/01/2013  . Leukocytosis, unspecified 07/01/2013  . UTI (lower urinary tract infection) 06/30/2013  . Weakness generalized 06/30/2013  . Syncope 10/28/2010  . Alzheimer's dementia 10/28/2010  . Incontinence 10/28/2010    Orientation RESPIRATION BLADDER Height & Weight      (Nonverbal; unable to assess)  Normal Continent, Indwelling catheter (Urinary catheter) Weight: 61.8 kg (136 lb 3.9 oz) Height:   (167.6 cm)  BEHAVIORAL SYMPTOMS/MOOD NEUROLOGICAL BOWEL NUTRITION STATUS      Incontinent Diet (Please see DC summary)  AMBULATORY STATUS COMMUNICATION OF NEEDS Skin   Extensive Assist Non-Verbally (Trouble speaking) Normal                       Personal Care Assistance Level of Assistance  Bathing, Feeding, Dressing Bathing Assistance: Maximum assistance Feeding  assistance: Maximum assistance Dressing Assistance: Maximum assistance     Functional Limitations Info  Speech     Speech Info: Impaired    SPECIAL CARE FACTORS FREQUENCY  PT (By licensed PT)     PT Frequency: min 2x/week              Contractures      Additional Factors Info  Code Status, Allergies Code Status Info: DNR Allergies Info: NKA           Current Medications (11/29/2015):  This is the current hospital active medication list Current Facility-Administered Medications  Medication Dose Route Frequency Provider Last Rate Last Dose  . acetaminophen (TYLENOL) tablet 650 mg  650 mg Oral Q6H PRN Eduard Clos, MD       Or  . acetaminophen (TYLENOL) suppository 650 mg  650 mg Rectal Q6H PRN Eduard Clos, MD      . antiseptic oral rinse (CPC / CETYLPYRIDINIUM CHLORIDE 0.05%) solution 7 mL  7 mL Mouth Rinse BID Ripudeep K Rai, MD   7 mL at 11/28/15 2210  . cefTRIAXone (ROCEPHIN) 2 g in dextrose 5 % 50 mL IVPB  2 g Intravenous Q24H Leatha Gilding, MD   2 g at 11/28/15 0943  . cholecalciferol (VITAMIN D) tablet 1,000 Units  1,000 Units Oral Q1500 Eduard Clos, MD   1,000 Units at 11/28/15 1554  . dextrose 5 % and 0.45% NaCl 1,000 mL with potassium chloride 20 mEq infusion   Intravenous Continuous Ripudeep Jenna Luo, MD 50  mL/hr at 11/28/15 0945    . docusate sodium (COLACE) capsule 100 mg  100 mg Oral Q1500 Eduard ClosArshad N Kakrakandy, MD   100 mg at 11/26/15 1659  . donepezil (ARICEPT) tablet 10 mg  10 mg Oral Q1500 Eduard ClosArshad N Kakrakandy, MD   10 mg at 11/28/15 1554  . memantine (NAMENDA) tablet 5 mg  5 mg Oral Q1500 Eduard ClosArshad N Kakrakandy, MD   5 mg at 11/28/15 1411  . ondansetron (ZOFRAN) tablet 4 mg  4 mg Oral Q6H PRN Eduard ClosArshad N Kakrakandy, MD       Or  . ondansetron [facility](ZOFRAN) injection 4 mg  4 mg Intravenous Q6H PRN Eduard ClosArshad N Kakrakandy, MD      . potassium chloride 20 MEQ/15ML (10%) solution 40 mEq  40 mEq Oral Once Ripudeep Jenna LuoK Rai, MD   40 mEq at 11/27/15 1015      Discharge Medications: Please see discharge summary for a list of discharge medications.  Relevant Imaging Results:  Relevant Lab Results:   Additional Information SSN: [id number]84  Ayman Brull S LampeterRayyan, [place]CSWA

## 2015-11-29 NOTE — Progress Notes (Signed)
[facility] [phone]

## 2015-11-29 NOTE — Discharge Summary (Signed)
Physician Discharge Summary   Patient ID: [name] MRN: [id number] DOB/AGE: 01/08/1921 82 y.o.  Admit date: 11/25/2015 Discharge date: 11/29/2015  Primary Care Physician:  Pcp Not In System  Discharge Diagnoses:    . Proteus septicemia (HCC) . AKI (acute kidney injury) (HCC) improved  . Severe sepsis (HCC) . Proteus UTI (urinary tract infection) . Acute encephalopathy . Alzheimer's dementia   Consults: Palliative   Recommendations for Outpatient Follow-up:  1. Home health PT, OT, RN, HHA arranged 2. Please repeat CBC/BMET at next visit   DIET: Regular diet    Allergies:  No Known Allergies   DISCHARGE MEDICATIONS: Current Discharge Medication List    START taking these medications   Details  amoxicillin-clavulanate (AUGMENTIN) 875-125 MG tablet Take 1 tablet by mouth 2 (two) times daily. X 9 more days Qty: 18 tablet, Refills: 0      CONTINUE these medications which have NOT CHANGED   Details  cholecalciferol (VITAMIN D) 1000 units tablet Take 1,000 Units by mouth daily at 3 pm.    docusate sodium (COLACE) 100 MG capsule Take 100 mg by mouth daily at 3 pm.     donepezil (ARICEPT) 10 MG tablet Take 10 mg by mouth daily at 3 pm.     memantine (NAMENDA) 5 MG tablet Take 5 mg by mouth daily at 3 pm.          Brief H and P: For complete details please refer to admission H and P, but in brief 82 y.o. female with medical history significant of dementia was brought to the ER after patient became less responsive, found to have sepsis due to UTI in the ED. PCCM was consulted and patient was admitted to SDU.    Hospital Course:  Severe sepsis from Proteus mirabilis UTI with Proteus Bacteremia - sepsis physiology improved, leukocytosis resolved Afebrile -Patient was placed on IV ceftriaxone, has received a total of 5 doses of IV ceftriaxone. Urine culture and blood cultures were positive for Proteus mirabilis, pansensitive. Discussed with Dr. Ninetta Lights,  infectious disease, oral Augmentin for 9 more days to complete full course. QTC prolonged hence avoiding fluoroquinolones.  - Lactic acid improved  Acute encephalopathy secondary to #1, In the setting of advanced dementia - - improving, continue antibiotics  Acute renal failure  - Resolved, probably from hypotension and nausea vomiting.   Dementia  - Continue Aricept, Namenda   Elevated troponin  - probably secondary to hypotension and sepsis. EKG does not show anything acute.  - Troponins trending down - 2-D echo showed EF of 50-55%, grade 2 diastolic dysfunction  Dysphagia, aspiration, secondary to dementia, failure to thrive -Speech evaluation done, started on regular diet - Palliative consult was placed for goals of care, currently DO NOT RESUSCITATE. Discussed in detail with the patient's son, he would like the patient to be at home and hence home health was arranged.  Hypokalemia, hypernatremia - Currently improving, sodium 143, potassium 3.6  Anemia - Likely anemia of chronic disease. FOBT negative - Patient had small amount of blood on the pad yesterday however per nursing staff, the bowel movements do not have any blood. H&H remained stable. Likely due to hemorrhoids. Unless severe drop in the hemoglobin or obvious bleeding, conservative management recommended given her age and comorbidities.  Day of Discharge BP 129/66 mmHg  Pulse 90  Temp(Src) 99.6 F (37.6 C) (Oral)  Resp 16  Ht 5\' 6"  (1.676 m)  Wt 61.8 kg (136 lb 3.9 oz)  BMI 22.00 kg/m2  SpO2 97%  Physical Exam: General: Alert and awake oriented x1 not in any acute distress. HEENT: anicteric sclera, pupils reactive to light and accommodation CVS: S1-S2 clear no murmur rubs or gallops Chest: clear to auscultation bilaterally, no wheezing rales or rhonchi Abdomen: soft nontender, nondistended, normal bowel sounds Extremities: no cyanosis, clubbing or edema noted bilaterally    The results of significant  diagnostics from this hospitalization (including imaging, microbiology, ancillary and laboratory) are listed below for reference.    LAB RESULTS: Basic Metabolic Panel:  Recent Labs Lab 11/28/15 0348 11/29/15 0702  NA 147* 143  K 3.2* 3.6  CL 115* 110  CO2 21* 22  GLUCOSE 81 115*  BUN 16 14  CREATININE 0.68 0.51  CALCIUM 7.8* 8.0*   Liver Function Tests:  Recent Labs Lab 11/26/15 0320 11/27/15 0432  AST 24 19  ALT 11* 11*  ALKPHOS 50 49  BILITOT 0.5 0.5  PROT 4.8* 4.8*  ALBUMIN 2.1* 2.1*   No results for input(s): LIPASE, AMYLASE in the last 168 hours. No results for input(s): AMMONIA in the last 168 hours. CBC:  Recent Labs Lab 11/26/15 0320  11/28/15 0348 11/29/15 0702  WBC 26.7*  < > 11.1* 8.9  NEUTROABS 23.2*  --   --   --   HGB 11.5*  < > 11.3* 11.4*  HCT 34.7*  < > 33.8* 34.3*  MCV 89.7  < > 91.1 87.9  PLT 142*  < > 120* 136*  < > = values in this interval not displayed. Cardiac Enzymes:  Recent Labs Lab 11/26/15 0914 11/26/15 1553  TROPONINI 0.41* 0.33*   BNP: Invalid input(s): POCBNP CBG:  Recent Labs Lab 11/28/15 1207 11/28/15 1731  GLUCAP 96 131*    Significant Diagnostic Studies:  Dg Chest 2 View  11/25/2015  CLINICAL DATA:  Vomiting today. Question aspiration. Initial encounter. EXAM: CHEST  2 VIEW COMPARISON:  Single-view of the chest 12/06/2014 and 08/31/2014. FINDINGS: The lungs are clear. Heart size is normal. No pneumothorax or pleural effusion. No focal bony abnormality. Aortic atherosclerosis noted. IMPRESSION: Negative for aspiration.  No acute disease. Electronically Signed   By: Drusilla Kanner M.D.   On: 11/25/2015 14:22   Dg Abd Portable 1v  11/25/2015  CLINICAL DATA:  82 year old female with pain nausea vomiting. Patient was admitted for sepsis. EXAM: PORTABLE ABDOMEN - 1 VIEW COMPARISON:  Pelvic radiograph dated 12/06/2014 FINDINGS: Large amount of dense stool noted throughout the distal colon and rectosigmoid. There is  no evidence of bowel obstruction. Right upper quadrant cholecystectomy clips. A 5 mm rounded density with eggshell calcification superimposed over the right iliac bone appears similar to prior study. There is scoliosis with advanced degenerative changes of the spine. There are osteoarthritic changes of the hip joints with narrowing of the femoroacetabular joint space. There are sclerotic changes of the femoral heads. No acute fracture. The soft tissues are grossly unremarkable. IMPRESSION: Constipation.  No evidence of bowel obstruction. Scoliosis, degenerative changes of the spine, and osteoarthritic changes of the hips. No acute fracture. Electronically Signed   By: Elgie Collard M.D.   On: 11/25/2015 21:40    2D ECHO:   Disposition and Follow-up: Discharge Instructions    Diet - low sodium heart healthy    Complete by:  As directed      Increase activity slowly    Complete by:  As directed             DISPOSITION: Home with home health   DISCHARGE  FOLLOW-UP Follow-up Information    [facility]   Why:  HH RN PT OT HHA. Will call in the next 24-48 hours to set up first home visit   Contact information:   [address] [place] [address] [phone]        Time spent on Discharge: 35 minutes  Signed:   RAI,RIPUDEEP M.D. [facility] 11/29/2015, 1:12 PM Pager: [phone]

## 2015-11-29 NOTE — Care Management Important Message (Signed)
Important Message  Patient Details  Name: [name] MRN: [id number] Date of Birth: [birth year]   Medicare Important Message Given:  Yes    Lawerance Sabalebbie Tristin Gladman, RN 11/29/2015, 11:50 AMImportant Message  Patient Details  Name: [name] MRN: [id number] Date of Birth: [birth year]   Medicare Important Message Given:  Yes    Lawerance Sabalebbie Maui Britten, RN 11/29/2015, 11:50 AM

## 2015-11-29 NOTE — Progress Notes (Signed)
Orders for Pt to D/C today. Pt's son had to go home to get Pt's clothes. Upon arrival back to hospital, son stated that he wasn't sure if he wanted her to go home because it would be dark soon. He stated that he wanted her to stay another night.

## 2015-11-29 NOTE — Progress Notes (Addendum)
Physical Therapy Treatment Patient Details Name: [name] MRN: [id number] DOB: 1920/09/07 Today's Date: 11/29/2015    History of Present Illness 82 y.o. female with medical history significant of dementia was brought to the ER after patient became less responsive, found to have sepsis due to UTI in the ED. PCCM was consulted and patient was admitted to SDU.     PT Comments    PTA assisted son and educated son in transfer techniques.  PTA used stedy to progress pt to standing in a more controlled environment.  Pt had large BM during PT session.  Pt assisted back to bed post session and turned to R side.  Son happy with patient progress.    Follow Up Recommendations  SNF;Supervision/Assistance - 24 hour     Equipment Recommendations   (lift equipment at home stedy vs. hoyer lift.  If patient is to d/c home. )    Recommendations for Other Services       Precautions / Restrictions Precautions Precautions: Fall Restrictions Weight Bearing Restrictions: No    Mobility  Bed Mobility Overal bed mobility: Needs Assistance;+2 for physical assistance Bed Mobility: Supine to Sit     Supine to sit: Max assist;+2 for physical assistance     General bed mobility comments: Pt performed sit to supine with son assisting therapist.    Transfers Overall transfer level: Needs assistance Equipment used: None Transfers: Sit to/from Stand Sit to Stand: Max assist;+2 physical assistance;Mod assist (assist level varried.  )         General transfer comment: Pt required tactile cueing to ascend from elevated bed.  Pt stood and found with incontinence of stool.  Pt required stand pivot in stedy to [facility] to finish BM.  Pt stood from [facility] back to stedy for transfer back to bed.  Pt fatigued from multiple transfers.  STS from bed x 3, BSC x1.STS from stedy pads x2.    Ambulation/Gait                 Stairs            Wheelchair Mobility    Modified Rankin (Stroke  Patients Only)       Balance     Sitting balance-Leahy Scale: Zero       Standing balance-Leahy Scale: Zero                      Cognition Arousal/Alertness: Awake/alert Behavior During Therapy: WFL for tasks assessed/performed Overall Cognitive Status: History of cognitive impairments - at baseline Area of Impairment: Orientation;Following commands;Safety/judgement;Awareness;Problem solving Orientation Level: Disoriented to;Time;Situation;Place   Memory: Decreased short-term memory Following Commands: Follows one step commands inconsistently Safety/Judgement: Decreased awareness of safety Awareness: Intellectual Problem Solving: Slow processing;Difficulty sequencing;Requires verbal cues;Requires tactile cues General Comments: confused    Exercises      General Comments        Pertinent Vitals/Pain Pain Assessment: Faces Faces Pain Scale: Hurts even more Pain Location: generalized Pain Descriptors / Indicators: Grimacing;Guarding;Moaning Pain Intervention(s): Repositioned;Monitored during session    Home Living                      Prior Function            PT Goals (current goals can now be found in the care plan section) Acute Rehab PT Goals Patient Stated Goal: to go home with son Potential to Achieve Goals: Fair Progress towards PT goals: Progressing toward goals  Frequency  Min 2X/week    PT Plan Discharge plan needs to be updated    Co-evaluation             End of Session   Activity Tolerance: Patient limited by fatigue Patient left: in bed;with call bell/phone within reach;with bed alarm set;with family/visitor present     Time: 1541-1611 PT Time Calculation (min) (ACUTE ONLY): 30 min  Charges:  $Therapeutic Activity: 23-37 mins                    G Codes:      [name] 11/29/2015, 4:21 PM  [name], PTA pager [phone]

## 2015-11-29 NOTE — Care Management Note (Signed)
Case Management Note  Patient Details  Name: [name] MRN: [id number] Date of Birth: [birth year]  Subjective/Objective:                 Spoke with patient's son at bedside. Patient will DC today with PO Abx, for sepsis. Son states patient has walker and all other DME needed. He lives with her at  Her home and can provide supervision. [facility]   Action/Plan:  Will DC to home today with son, [facility]. Expected Discharge Date:                  Expected Discharge Plan:  Home w Home Health Services  In-House Referral:     Discharge planning Services  CM Consult  Post Acute Care Choice:  Home Health Choice offered to:  Adult Children  DME Arranged:    DME Agency:     HH Arranged:  RN, PT, OT, Nurse's Aide [facility]  Status of Service:  Completed, signed off  Medicare Important Message Given:  Yes Date Medicare IM Given:    Medicare IM give by:    Date Additional Medicare IM Given:    Additional Medicare Important Message give by:     If discussed at Long Length of Stay Meetings, dates discussed:    Additional Comments:  Lawerance SabalDebbie Alylah Blakney, RN 11/29/2015, 11:56 AM

## 2015-11-30 ENCOUNTER — Inpatient Hospital Stay (HOSPITAL_COMMUNITY): Payer: Medicare Other

## 2015-11-30 DIAGNOSIS — R609 Edema, unspecified: Secondary | ICD-10-CM

## 2015-11-30 DIAGNOSIS — R338 Other retention of urine: Secondary | ICD-10-CM

## 2015-11-30 LAB — CBC
HEMATOCRIT: 37.3 % (ref 36.0–46.0)
HEMOGLOBIN: 12.9 g/dL (ref 12.0–15.0)
MCH: 30.7 pg (ref 26.0–34.0)
MCHC: 34.6 g/dL (ref 30.0–36.0)
MCV: 88.8 fL (ref 78.0–100.0)
Platelets: DECREASED 10*3/uL (ref 150–400)
RBC: 4.2 MIL/uL (ref 3.87–5.11)
RDW: 13.5 % (ref 11.5–15.5)
WBC: 7.9 10*3/uL (ref 4.0–10.5)

## 2015-11-30 LAB — GLUCOSE, CAPILLARY
Glucose-Capillary: 109 mg/dL — ABNORMAL HIGH (ref 65–99)
Glucose-Capillary: 95 mg/dL (ref 65–99)

## 2015-11-30 LAB — BASIC METABOLIC PANEL
ANION GAP: 12 (ref 5–15)
ANION GAP: 10 (ref 5–15)
BUN: 13 mg/dL (ref 6–20)
BUN: 12 mg/dL (ref 6–20)
CALCIUM: 8.1 mg/dL — AB (ref 8.9–10.3)
CHLORIDE: 106 mmol/L (ref 101–111)
CHLORIDE: 105 mmol/L (ref 101–111)
CO2: 20 mmol/L — AB (ref 22–32)
CO2: 22 mmol/L (ref 22–32)
Calcium: 8.1 mg/dL — ABNORMAL LOW (ref 8.9–10.3)
Creatinine, Ser: 0.49 mg/dL (ref 0.44–1.00)
Creatinine, Ser: 0.46 mg/dL (ref 0.44–1.00)
GFR calc non Af Amer: >60 mL/min (ref >60)
GFR calc non Af Amer: >60 mL/min (ref >60)
GLUCOSE: 108 mg/dL — AB (ref 65–99)
Glucose, Bld: 86 mg/dL (ref 65–99)
POTASSIUM: 4.3 mmol/L (ref 3.5–5.1)
Potassium: 5.5 mmol/L — ABNORMAL HIGH (ref 3.5–5.1)
Sodium: 138 mmol/L (ref 135–145)
Sodium: 137 mmol/L (ref 135–145)

## 2015-11-30 NOTE — Progress Notes (Signed)
D/c instructions RX's and follow up appts reviewed/provided to family verbalized understanding. Patient d/c home with son left floor via wheelchair accompanied by staff no s/s pain noted.  Primitivo Merkey, Kae HellerMiranda Lynn, RN

## 2015-11-30 NOTE — Discharge Instructions (Signed)
Bacteremia °Bacteremia is the presence of bacteria in the blood. A small amount of bacteria may not cause any symptoms. °Sometimes, the bacteria spread and cause infection in other parts of the body, such as the heart, joints, bones, or brain. Having a great amount of bacteria can cause a serious, sometimes life-threatening infection called sepsis. °CAUSES °This condition is caused by bacteria that get into the blood. Bacteria can enter the blood: °· During a dental or medical procedure. °· After you brush your teeth so hard that the gums bleed. °· Through a scrape or cut on your skin. °More severe types of bacteremia can be caused by: °· A bacterial infection, such as pneumonia, that spreads to the blood. °· Using a dirty needle. °RISK FACTORS °This condition is more likely to develop in: °· Children and elderly adults. °· People who have a long-lasting (chronic) disease or medical condition. °· People who have an artificial joint or heart valve. °· People who have heart valve disease. °· People who have a tube, such as a catheter or IV tube, that has been inserted for a medical treatment. °· People who have a weak body defense system (immune system). °· People who use IV drugs. °SYMPTOMS °Usually, this condition does not cause symptoms when it is mild. When it is more serious, it may cause: °· Fever. °· Chills. °· Racing heart. °· Shortness of breath. °· Dizziness. °· Weakness. °· Confusion. °· Nausea or vomiting. °· Diarrhea. °Bacteremia that has spread to other parts of the body may cause symptoms in those areas. °DIAGNOSIS °This condition may be diagnosed with a physical exam and tests, such as: °· A complete blood count (CBC). This test looks for signs of infection. °· Blood cultures. These look for bacteria in your blood. °· Tests of any IV tubes. These look for a source of infection. °· Urine tests. °· Imaging tests, such as an X-ray, CT scan, MRI, or heart ultrasound. °TREATMENT °If the condition is mild,  treatment is usually not needed. Usually, the body's immune system will remove the bacteria. If the condition is more serious, it may be treated with: °· Antibiotic medicines through an IV tube. These may be given for about 2 weeks. At first, the antibiotic that is given may kill most types of blood bacteria. If your test results show that a certain kind of bacteria is causing problems, the antibiotic may be changed to kill only the bacteria that are causing problems. °· Antibiotics taken by mouth. °· Removing any catheter or IV tube that is a source of infection. °· Blood pressure and breathing support, if needed. °· Surgery to control the source or spread of infection, if needed. °HOME CARE INSTRUCTIONS °· Take over-the-counter and prescription medicines only as told by your health care provider. °· If you were prescribed an antibiotic, take it as told by your health care provider. Do not stop taking the antibiotic even if you start to feel better. °· Rest at home until your condition is under control. °· Drink enough fluid to keep your urine clear or pale yellow. °· Keep all follow-up visits as told by your health care provider. This is important. °PREVENTION °Take these actions to help prevent future episodes of bacteremia: °· Get all vaccinations as recommended by your health care provider. °· Clean and cover scrapes or cuts. °· Bathe regularly. °· Wash your hands often. °· Before any dental or surgical procedure, ask your health care provider if you should take an antibiotic. °SEEK MEDICAL   CARE IF: °· Your symptoms get worse. °· You continue to have symptoms after treatment. °· You develop new symptoms after treatment. °SEEK IMMEDIATE MEDICAL CARE IF: °· You have chest pain or trouble breathing. °· You develop confusion, dizziness, or weakness. °· You develop pale skin. °  °This information is not intended to replace advice given to you by your health care provider. Make sure you discuss any questions you have  with your health care provider. °  °Document Released: 04/19/2006 Document Revised: 03/27/2015 Document Reviewed: 09/08/2014 °Elsevier Interactive Patient Education ©2016 Elsevier Inc. °Urinary Tract Infection °Urinary tract infections (UTIs) can develop anywhere along your urinary tract. Your urinary tract is your body's drainage system for removing wastes and extra water. Your urinary tract includes two kidneys, two ureters, a bladder, and a urethra. Your kidneys are a pair of bean-shaped organs. Each kidney is about the size of your fist. They are located below your ribs, one on each side of your spine. °CAUSES °Infections are caused by microbes, which are microscopic organisms, including fungi, viruses, and bacteria. These organisms are so small that they can only be seen through a microscope. Bacteria are the microbes that most commonly cause UTIs. °SYMPTOMS  °Symptoms of UTIs may vary by age and gender of the patient and by the location of the infection. Symptoms in young women typically include a frequent and intense urge to urinate and a painful, burning feeling in the bladder or urethra during urination. Older women and men are more likely to be tired, shaky, and weak and have muscle aches and abdominal pain. A fever may mean the infection is in your kidneys. Other symptoms of a kidney infection include pain in your back or sides below the ribs, nausea, and vomiting. °DIAGNOSIS °To diagnose a UTI, your caregiver will ask you about your symptoms. Your caregiver will also ask you to provide a urine sample. The urine sample will be tested for bacteria and white blood cells. White blood cells are made by your body to help fight infection. °TREATMENT  °Typically, UTIs can be treated with medication. Because most UTIs are caused by a bacterial infection, they usually can be treated with the use of antibiotics. The choice of antibiotic and length of treatment depend on your symptoms and the type of bacteria  causing your infection. °HOME CARE INSTRUCTIONS °· If you were prescribed antibiotics, take them exactly as your caregiver instructs you. Finish the medication even if you feel better after you have only taken some of the medication. °· Drink enough water and fluids to keep your urine clear or pale yellow. °· Avoid caffeine, tea, and carbonated beverages. They tend to irritate your bladder. °· Empty your bladder often. Avoid holding urine for long periods of time. °· Empty your bladder before and after sexual intercourse. °· After a bowel movement, women should cleanse from front to back. Use each tissue only once. °SEEK MEDICAL CARE IF:  °· You have back pain. °· You develop a fever. °· Your symptoms do not begin to resolve within 3 days. °SEEK IMMEDIATE MEDICAL CARE IF:  °· You have severe back pain or lower abdominal pain. °· You develop chills. °· You have nausea or vomiting. °· You have continued burning or discomfort with urination. °MAKE SURE YOU:  °· Understand these instructions. °· Will watch your condition. °· Will get help right away if you are not doing well or get worse. °  °This information is not intended to replace advice given to you   you by your health care provider. Make sure you discuss any questions you have with your health care provider.   Document Released: 04/15/2005 Document Revised: 03/27/2015 Document Reviewed: 08/14/2011 Elsevier Interactive Patient Education 2016 Elsevier Inc.  Acute Urinary Retention, Female Acute urinary retention is the temporary inability to urinate. This is an uncommon problem in women. It can be caused by:  Infection.  A side effect of a medicine.  A problem in a nearby organ that presses or squeezes on the bladder or the urethra (the tube that drains the bladder).  Psychological problems.   Surgery on your bladder, urethra, or pelvic organs that causes obstruction to the outflow of urine from your bladder. HOME CARE INSTRUCTIONS  If you are sent  home with a Foley catheter and a drainage system, you will need to discuss the best course of action with your health care provider. While the catheter is in, maintain a good intake of fluids. Keep the drainage bag emptied and lower than your catheter. This is so that contaminated urine will not flow back into your bladder, which could lead to a urinary tract infection. There are two main types of drainage bags. One is a large bag that usually is used at night. It has a good capacity that will allow you to sleep through the night without having to empty it. The second type is called a leg bag. It has a smaller capacity so it needs to be emptied more frequently. However, the main advantage is that it can be attached by a leg strap and goes underneath your clothing, allowing you the freedom to move about or leave your home. Only take over-the-counter or prescription medicines for pain, discomfort, or fever as directed by your health care provider.  SEEK MEDICAL CARE IF:  You develop a low-grade fever.  You experience spasms or leakage of urine with the spasms. SEEK IMMEDIATE MEDICAL CARE IF:   You develop chills or fever.  Your catheter stops draining urine.  Your catheter falls out.  You start to develop increased bleeding that does not respond to rest and increased fluid intake. MAKE SURE YOU:  Understand these instructions.  Will watch your condition.  Will get help right away if you are not doing well or get worse.   This information is not intended to replace advice given to you by your health care provider. Make sure you discuss any questions you have with your health care provider.   Document Released: 07/05/2006 Document Revised: 11/20/2014 Document Reviewed: 12/15/2012 Elsevier Interactive Patient Education [employer] Elsevier Inc.

## 2015-11-30 NOTE — Progress Notes (Signed)
Progress note  Patient had been discharged on 5/12 by Dr. Isidoro Donningai. Due to late hours, family was unable to take patient home. Overnight events regarding acute urinary retention post removal of Foley catheter noted. In and out catheter done last night. Patient was interviewed and examined this morning. Discussed extensively with patient's son at bedside. Discussed with patient's RN. Updated discharge summary.  [name] ScottHONGALGI,[name] Gravatt, MD, FACP, FHM. [facility] Pager [phone]  If 7PM-7AM, please contact night-coverage [email or web address] Password [email or web address] 11/30/2015, 12:12 PM

## 2015-11-30 NOTE — Progress Notes (Addendum)
MD notified about pt's son not wanting his mother to be discharged this late in the evening. MD returned page stating that it was fine and that she would be discharged tomorrow at the son's discretion.

## 2015-11-30 NOTE — Progress Notes (Signed)
Patient incont of urine bed wet, bladder scan performed and show patient had 947 ml urine. MD made aware and was instructed to replace foley patient will be d/c home with foley and follow up with urology. 16 fr foley placed without difficultly 850 ml clear yellow urine returned immediatly. Patient tolerated well patients som was educated on foley care.  [name], Kae HellerMiranda Lynn, RN

## 2015-11-30 NOTE — Discharge Summary (Addendum)
Physician Discharge Summary   Patient ID: [name] MRN: [id number] DOB/AGE: December 28, 1920 82 y.o.  Admit date: 11/25/2015 Discharge date: 11/30/2015  Primary Care Physician:  [name], NP  Discharge Diagnoses:    . Proteus septicemia (HCC) . AKI (acute kidney injury) (HCC) improved  . Severe sepsis (HCC) . Proteus UTI (urinary tract infection) . Acute encephalopathy . Alzheimer's dementia   Consults: Palliative   Recommendations for Outpatient Follow-up:  1. Home health PT, OT, RN, HHA arranged 2. [name], NP/PCP in one week with repeat labs (CBC & BMP). 3. [facility], in 1 week regarding consultation and evaluation of acute urinary retention and decision regarding Foley catheter removal.   DIET: Regular diet    Allergies:  No Known Allergies   DISCHARGE MEDICATIONS: Current Discharge Medication List    START taking these medications   Details  amoxicillin-clavulanate (AUGMENTIN) 875-125 MG tablet Take 1 tablet by mouth 2 (two) times daily. X 9 more days Qty: 18 tablet, Refills: 0      CONTINUE these medications which have NOT CHANGED   Details  cholecalciferol (VITAMIN D) 1000 units tablet Take 1,000 Units by mouth daily at 3 pm.    docusate sodium (COLACE) 100 MG capsule Take 100 mg by mouth daily at 3 pm.     donepezil (ARICEPT) 10 MG tablet Take 10 mg by mouth daily at 3 pm.     memantine (NAMENDA) 5 MG tablet Take 5 mg by mouth daily at 3 pm.          Brief H and P: For complete details please refer to admission H and P, but in brief 82 y.o. female with medical history significant of dementia was brought to the ER after patient became less responsive, found to have sepsis due to UTI in the ED. PCCM was consulted and patient was admitted to SDU. Patient's son lives with patient and has additional caregivers assisting patient. She has 24/7 supervision and care at home. She ambulates with the help of a walker. Prior history  of vasovagal syncope related to constipation and straining.  Patient had been discharged initially on 5/12 but family was unable to take patient home. Patient was interviewed and examined again today. Discussed at length with patient's son at bedside.  Hospital Course:  Severe sepsis from Proteus mirabilis UTI with Proteus Bacteremia - sepsis physiology improved, leukocytosis resolved Afebrile -Patient was placed on IV ceftriaxone, has received a total of 6 doses of IV ceftriaxone. Urine culture and blood cultures were positive for Proteus mirabilis, pansensitive. Dr. Isidoro Donning discussed with Dr. Ninetta Lights, infectious disease, oral Augmentin for 9 more days to complete full course. QTC prolonged hence avoiding fluoroquinolones.  - Lactic acid improved - Sepsis resolved.  Acute encephalopathy secondary to #1, In the setting of advanced dementia - - improving, continue antibiotics - Mental status back to baseline as per patient's son at bedside.  Acute renal failure  - Resolved, probably from hypotension and nausea vomiting.   Dementia  - Continue Aricept, Namenda   Elevated troponin  - probably secondary to hypotension and sepsis. EKG does not show anything acute.  - Troponins trending down - 2-D echo showed EF of 50-55%, grade 2 diastolic dysfunction  Dysphagia, aspiration, secondary to dementia, failure to thrive -Speech evaluation done, started on regular diet - Palliative consult was placed for goals of care, currently DO NOT RESUSCITATE. Dr. Isidoro Donning discussed in detail with the patient's son, he would like the patient to be at home and  hence home health was arranged.  Hypokalemia, hypernatremia - Hypernatremia resolved. Potassium 5.5 which probably was a lab error. Repeated BMP and potassium 4.3.  Anemia - Likely anemia of chronic disease. FOBT negative - Patient had small amount of blood on the pad on 5/11 however per nursing staff, the bowel movements do not have any blood. H&H  remained stable. Likely due to hemorrhoids. Unless severe drop in the hemoglobin or obvious bleeding, conservative management recommended given her age and comorbidities. - Stable.  Mild thrombocytopenia - Stable. Outpatient follow-up.  Acute urinary retention - Indwelling Foley catheter was removed on 5/12. Overnight 5/12, she developed urinary retention and had in and out cath draining 350 mL urine. Incontinent this morning but bladder scan shows >800 mL. Discussed with urologist on call who recommended placing indwelling Foley catheter and discharge patient home on same and outpatient follow-up with urology in 1 week regarding voiding trial and possibly removing Foley catheter at that time. This was discussed at length with patient's son who verbalized understanding.  Constipation - Chronic problem. As per son, patient had a large BM on 5/12. Continue bowel regimen at home.  Right upper extremity pain and swelling  - RUE venous Doppler shows acute superficial thrombosis in the cephalic vein at the site of prior IV site in the antecubital fossa. Chronic appearing superficial thrombosis in the basilic vein and the cephalic vein in the upper arm. No evidence of DVT. Supportive treatment with elevation of limb, heat pad and patient already on antibiotics.   Day of Discharge BP 139/69 mmHg  Pulse 87  Temp(Src) 98.2 F (36.8 C) (Axillary)  Resp 18  Ht 5\' 6"  (1.676 m)  Wt 60.5 kg (133 lb 6.1 oz)  BMI 21.54 kg/m2  SpO2 99%  Physical Exam: General: Pleasant elderly female sitting up comfortably in bed eating breakfast this morning. Patient's son is at bedside. HEENT: anicteric sclera, pupils reactive to light and accommodation CVS: S1-S2 clear no murmur rubs or gallops Chest: clear to auscultation bilaterally, no wheezing rales or rhonchi Abdomen: soft nontender, nondistended, normal bowel sounds Extremities: no cyanosis, clubbing or edema noted bilaterally. Mild diffuse swelling of right  upper extremity distal to the elbow. Minimal bruising in the right antecubital fossa at site of possible prior IV. No increased warmth, erythema, fluctuation or crepitus. Good peripheral pulses felt. No acute arthritic changes but does have chronic arthritic changes and right wrist.   CNS: Alert and oriented to self only. No focal deficits.     The results of significant diagnostics from this hospitalization (including imaging, microbiology, ancillary and laboratory) are listed below for reference.    LAB RESULTS: Basic Metabolic Panel:  Recent Labs Lab 11/30/15 0802 11/30/15 1216  NA 138 137  K 5.5* 4.3  CL 106 105  CO2 20* 22  GLUCOSE 108* 86  BUN 13 12  CREATININE 0.49 0.46  CALCIUM 8.1* 8.1*   Liver Function Tests:  Recent Labs Lab 11/26/15 0320 11/27/15 0432  AST 24 19  ALT 11* 11*  ALKPHOS 50 49  BILITOT 0.5 0.5  PROT 4.8* 4.8*  ALBUMIN 2.1* 2.1*   No results for input(s): LIPASE, AMYLASE in the last 168 hours. No results for input(s): AMMONIA in the last 168 hours. CBC:  Recent Labs Lab 11/26/15 0320  11/29/15 0702 11/30/15 1105  WBC 26.7*  < > 8.9 7.9  NEUTROABS 23.2*  --   --   --   HGB 11.5*  < > 11.4* 12.9  HCT 34.7*  < >  34.3* 37.3  MCV 89.7  < > 87.9 88.8  PLT 142*  < > 136* PLATELET CLUMPS NOTED ON SMEAR, COUNT APPEARS DECREASED  < > = values in this interval not displayed. Cardiac Enzymes:  Recent Labs Lab 11/26/15 0914 11/26/15 1553  TROPONINI 0.41* 0.33*   BNP: Invalid input(s): POCBNP CBG:  Recent Labs Lab 11/30/15 0656 11/30/15 1158  GLUCAP 109* 95    Significant Diagnostic Studies:  Dg Chest 2 View  11/25/2015  CLINICAL DATA:  Vomiting today. Question aspiration. Initial encounter. EXAM: CHEST  2 VIEW COMPARISON:  Single-view of the chest 12/06/2014 and 08/31/2014. FINDINGS: The lungs are clear. Heart size is normal. No pneumothorax or pleural effusion. No focal bony abnormality. Aortic atherosclerosis noted. IMPRESSION:  Negative for aspiration.  No acute disease. Electronically Signed   By: Drusilla Kanner M.D.   On: 11/25/2015 14:22   Dg Abd Portable 1v  11/25/2015  CLINICAL DATA:  82 year old female with pain nausea vomiting. Patient was admitted for sepsis. EXAM: PORTABLE ABDOMEN - 1 VIEW COMPARISON:  Pelvic radiograph dated 12/06/2014 FINDINGS: Large amount of dense stool noted throughout the distal colon and rectosigmoid. There is no evidence of bowel obstruction. Right upper quadrant cholecystectomy clips. A 5 mm rounded density with eggshell calcification superimposed over the right iliac bone appears similar to prior study. There is scoliosis with advanced degenerative changes of the spine. There are osteoarthritic changes of the hip joints with narrowing of the femoroacetabular joint space. There are sclerotic changes of the femoral heads. No acute fracture. The soft tissues are grossly unremarkable. IMPRESSION: Constipation.  No evidence of bowel obstruction. Scoliosis, degenerative changes of the spine, and osteoarthritic changes of the hips. No acute fracture. Electronically Signed   By: Elgie Collard M.D.   On: 11/25/2015 21:40    2D ECHO: 11/27/15: Study Conclusions  - Left ventricle: The cavity size was normal. There was mild focal  basal hypertrophy of the septum. Systolic function was normal.  The estimated ejection fraction was in the range of 50% to 55%.  Wall motion was normal; there were no regional wall motion  abnormalities. Features are consistent with a pseudonormal left  ventricular filling pattern, with concomitant abnormal relaxation  and increased filling pressure (grade 2 diastolic dysfunction).  Doppler parameters are consistent with indeterminate ventricular  filling pressure. - Aortic valve: Trileaflet; moderately thickened, moderately  calcified leaflets. Valve mobility was restricted. - Mitral valve: Calcified annulus. Transvalvular velocity was  within the normal  range. There was no evidence for stenosis.  There was mild regurgitation directed posteriorly. - Left atrium: The atrium was mildly dilated. - Right ventricle: The cavity size was normal. Wall thickness was  normal. Systolic function was normal. - Tricuspid valve: There was no regurgitation. - Inferior vena cava: The vessel was normal in size. The  respirophasic diameter changes were blunted (< 50%), consistent  with normal central venous pressure. - Pericardium, extracardiac: There was a left pleural effusion.  Right upper extremity venous Doppler (preliminary report) 11/30/15: Right upper extremity venous duplex completed.   Preliminary report: Acute superficial thrombosis noted in the cephalic vein at what appears to be a former IV site at the ante cubitus. Chronic appearing superficial thrombosis in the basilic vein and the cephalic vein in the upper arm. No evidence of DVT.   Disposition and Follow-up: Discharge Instructions    Call MD for:  difficulty breathing, headache or visual disturbances    Complete by:  As directed  Call MD for:  extreme fatigue    Complete by:  As directed      Call MD for:  persistant dizziness or light-headedness    Complete by:  As directed      Call MD for:  persistant nausea and vomiting    Complete by:  As directed      Call MD for:  redness, tenderness, or signs of infection (pain, swelling, redness, odor or green/yellow discharge around incision site)    Complete by:  As directed      Call MD for:  severe uncontrolled pain    Complete by:  As directed      Call MD for:  temperature >100.4    Complete by:  As directed      Diet - low sodium heart healthy    Complete by:  As directed      Discharge instructions    Complete by:  As directed   Continue urinary catheter/foley catheter until outpatient consultation and follow up with Urology in 1 week.     Increase activity slowly    Complete by:  As directed              DISPOSITION: Home with home health   DISCHARGE FOLLOW-UP Follow-up Information    Follow up with [facility].   Why:  HH RN PT OT HHA. Will call in the next 24-48 hours to set up first home visit   Contact information:   [address] [place] [address] [phone]       Follow up with [facility]. Schedule an appointment as soon as possible for a visit in 1 week.   Why:  Re: Consultation and follow-up of acute urinary retention.   Contact information:   [address] [place] [address] [phone]      Follow up with [name], NP. Schedule an appointment as soon as possible for a visit in 1 week.   Specialty:  Nurse Practitioner   Why:  To be seen with repeat labs (CBC & BMP).   Contact information:   [address] [place] [address] [phone]        Time spent on Discharge: 35 minutes  Signed:   Jackson South M.D. [facility] 11/30/2015, 1:51 PM Pager: [phone]

## 2015-11-30 NOTE — Progress Notes (Signed)
VASCULAR LAB PRELIMINARY  PRELIMINARY  PRELIMINARY  PRELIMINARY  Right upper extremity venous duplex completed.    Preliminary report:  Acute superficial thrombosis noted in the cephalic vein at what appears to be a former IV site at the ante cubitus.  Chronic appearing superficial thrombosis in the basilic vein and the cephalic vein in the upper arm.  No evidence of DVT.    Report given to Dr. Waymon AmatoHongalgi.  Ivylynn Hoppes, RVT 11/30/2015, 11:04 AM

## 2015-12-03 LAB — CULTURE, BLOOD (ROUTINE X 2)
CULTURE: NO GROWTH
Culture: NO GROWTH

## 2016-03-24 ENCOUNTER — Encounter: Payer: Medicare Other | Attending: Internal Medicine | Admitting: Internal Medicine

## 2016-03-24 DIAGNOSIS — G309 Alzheimer's disease, unspecified: Secondary | ICD-10-CM | POA: Diagnosis not present

## 2016-03-24 DIAGNOSIS — M1990 Unspecified osteoarthritis, unspecified site: Secondary | ICD-10-CM | POA: Diagnosis not present

## 2016-03-24 DIAGNOSIS — L97213 Non-pressure chronic ulcer of right calf with necrosis of muscle: Secondary | ICD-10-CM | POA: Diagnosis not present

## 2016-03-24 DIAGNOSIS — I10 Essential (primary) hypertension: Secondary | ICD-10-CM | POA: Insufficient documentation

## 2016-03-24 DIAGNOSIS — L89612 Pressure ulcer of right heel, stage 2: Secondary | ICD-10-CM | POA: Diagnosis present

## 2016-03-24 DIAGNOSIS — L89613 Pressure ulcer of right heel, stage 3: Secondary | ICD-10-CM | POA: Diagnosis not present

## 2016-03-24 NOTE — Progress Notes (Signed)
[name] [name], [name] L. ([id number]) Visit Report for 03/24/2016 Abuse/Suicide Risk Screen Details [name] [name], [name] Date of Service: 03/24/2016 8:45 AM Patient Name: [name] Account Number: [id number] Medical Record Treating RN: [name] Mealyfful, RN, BSN, [place] SinkRita [id number] Number: Other Clinician: 14-Dec-1920 (82 y.o. Treating [name] [name] Date of Birth/Sex: Female) Physician/Extender: G Primary Care [name] [name] Physician: Referring Physician: Weeks in Treatment: 0 Abuse/Suicide Risk Screen Items Answer ABUSE/SUICIDE RISK SCREEN: Has anyone close to you tried to hurt or harm you recentlyo No Do you feel uncomfortable with anyone in your familyo No Has anyone forced you do things that you didnot want to doo No Do you have any thoughts of harming yourselfo No Patient displays signs or symptoms of abuse and/or neglect. No Electronic Signature(s) Signed: 03/24/2016 4:23:09 PM By: [name] [name], [name] BSN, RN Entered By: [name] [name], [name] on 03/24/2016 08:55:02 [name], [name] SeatsELIZABETH L. ([id number]) -------------------------------------------------------------------------------- Activities of Daily Living Details [name] [name], [name] Date of Service: 03/24/2016 8:45 AM Patient Name: [name] Account Number: [id number] Medical Record Treating RN: [name] Mealyfful, RN, BSN, [place] SinkRita [id number] Number: Other Clinician: 14-Dec-1920 (82 y.o. Treating [name] [name] Date of Birth/Sex: Female) Physician/Extender: G Primary Care [name] [name] Physician: Referring Physician: Weeks in Treatment: 0 Activities of Daily Living Items Answer Activities of Daily Living (Please select one for each item) Drive Automobile Not Able Take Medications Not Able Use Telephone Not Able Care for Appearance Need Assistance Use Toilet Need Assistance Bath / Shower Need Assistance Dress Self Need Assistance Feed Self Need Assistance Walk Need Assistance Get In / Out Bed Need Assistance Housework Need Assistance Prepare Meals Need  Assistance Handle Money Need Assistance Shop for Self Need Assistance Electronic Signature(s) Signed: 03/24/2016 4:23:09 PM By: [name] [name], [name] BSN, RN Entered By: [name] [name], [name] on 03/24/2016 08:55:46 [name], [name] SeatsELIZABETH L. ([id number]) -------------------------------------------------------------------------------- Education Assessment Details [name] [name], [name] Date of Service: 03/24/2016 8:45 AM Patient Name: [name] Account Number: [id number] Medical Record Treating RN: [name] Mealyfful, RN, BSN, [place] SinkRita [id number] Number: Other Clinician: 14-Dec-1920 (82 y.o. Treating [name] [name] Date of Birth/Sex: Female) Physician/Extender: G Primary Care [name] [name] Physician: Referring Physician: Weeks in Treatment: 0 Primary Learner Assessed: Caregiver son Reason Patient is not Primary Learner: alzeihmers Learning Preferences/Education Level/Primary Language Learning Preference: Explanation Highest Education Level: [employer] or Above Preferred Language: English Cognitive Barrier Assessment/Beliefs Language Barrier: No Physical Barrier Assessment Impaired Vision: Yes Glasses Impaired Hearing: No Decreased Hand dexterity: No Knowledge/Comprehension Assessment Knowledge Level: High Comprehension Level: High Ability to understand written High instructions: Ability to understand verbal High instructions: Motivation Assessment Anxiety Level: Calm Cooperation: Cooperative Education Importance: Acknowledges Need Interest in Health Problems: Asks Questions Perception: Coherent Willingness to Engage in Self- High Management Activities: Readiness to Engage in Self- High Management Activities: Electronic Signature(s) [name] [name], [name] ([id number]) Signed: 03/24/2016 4:23:09 PM By: [name] [name], [name] BSN, RN Entered By: [name] [name], [name] on 03/24/2016 08:56:34 [name], [name] SeatsELIZABETH L. ([id number]) -------------------------------------------------------------------------------- Fall Risk Assessment  Details [name] [name], [name] Date of Service: 03/24/2016 8:45 AM Patient Name: [name] Account Number: [id number] Medical Record Treating RN: [name] Mealyfful, RN, BSN, [place] SinkRita [id number] Number: Other Clinician: 14-Dec-1920 (82 y.o. Treating [name] [name] Date of Birth/Sex: Female) Physician/Extender: G Primary Care [name] [name] Physician: Referring Physician: Weeks in Treatment: 0 Fall Risk Assessment Items Have you had 2 or more falls in the last 12 monthso 0 No Have you had any fall that resulted in injury in the last 12 monthso 0 No FALL RISK ASSESSMENT: History of falling - immediate or within 3 months 0 No Secondary diagnosis 0 No Ambulatory aid None/bed rest/wheelchair/nurse 0 Yes Crutches/cane/walker  0 No Furniture 0 No IV Access/Saline Lock 0 No Gait/Training Normal/bed rest/immobile 0 No Weak 10 Yes Impaired 20 Yes Mental Status Oriented to own ability 0 Yes Electronic Signature(s) Signed: 03/24/2016 4:23:09 PM By: [name] Eric BSN, RN Entered By: [name] Eric on 03/24/2016 08:58:30 [name], [name] [name] ([id number]) -------------------------------------------------------------------------------- Foot Assessment Details [name] Babe Date of Service: 03/24/2016 8:45 AM Patient Name: [name] Account Number: [id number] Medical Record Treating RN: [name] Mealy RN, BSN, [place] Sink [id number] Number: Other Clinician: [birth year] (82 y.o. Treating [name] [name] Date of Birth/Sex: Female) Physician/Extender: G Primary Care [name] [name] Physician: Referring Physician: Weeks in Treatment: 0 Foot Assessment Items Site Locations + = Sensation present, - = Sensation absent, C = Callus, U = Ulcer R = Redness, W = Warmth, M = Maceration, PU = Pre-ulcerative lesion F = Fissure, S = Swelling, D = Dryness Assessment Right: Left: Other Deformity: No No Prior Foot Ulcer: No No Prior Amputation: No No Charcot Joint: No No Ambulatory Status: Ambulatory With Help Assistance Device:  Wheelchair Gait: Surveyor, mining) Signed: 03/24/2016 4:23:09 PM By: [name] Eric BSN, RN Bixler, [name] [name] ([id number]) Entered By: [name] Eric on 03/24/2016 08:59:07 Bondarenko, [name] [name] ([id number]) -------------------------------------------------------------------------------- Nutrition Risk Assessment Details [name] Babe Date of Service: 03/24/2016 8:45 AM Patient Name: [name] Account Number: [id number] Medical Record Treating RN: [name] Mealy RN, BSN, [place] Sink [id number] Number: Other Clinician: [birth year] (82 y.o. Treating [name] [name] Date of Birth/Sex: Female) Physician/Extender: G Primary Care [name] [name] Physician: Referring Physician: Weeks in Treatment: 0 Height (in): Weight (lbs): Body Mass Index (BMI): Nutrition Risk Assessment Items NUTRITION RISK SCREEN: I have an illness or condition that made me change the kind and/or 0 No amount of food I eat I eat fewer than two meals per day 3 Yes I eat few fruits and vegetables, or milk products 0 No I have three or more drinks of beer, liquor or wine almost every day 0 No I have tooth or mouth problems that make it hard for me to eat 0 No I don't always have enough money to buy the food I need 0 No I eat alone most of the time 0 No I take three or more different prescribed or over-the-counter drugs a 0 No day Without wanting to, I have lost or gained 10 pounds in the last six 2 Yes months I am not always physically able to shop, cook and/or feed myself 0 No Nutrition Protocols Good Risk Protocol Provide education on Moderate Risk Protocol 0 nutrition Electronic Signature(s) Signed: 03/24/2016 4:23:09 PM By: [name] Eric BSN, RN Entered By: [name] Eric on 03/24/2016 08:58:51

## 2016-03-25 NOTE — Progress Notes (Addendum)
[name], KALLEN ([id number]) Visit Report for 03/24/2016 Allergy List Details [name] Date of Service: 03/24/2016 8:45 AM Patient Name: [name] Account Number: [id number] Medical Record Treating RN: Clover Mealy RN, BSN, [place] Sink [id number] Number: Other Clinician: May 16, 1921 (82 y.o. Treating [name], [name] Date of Birth/Sex: Female) Physician/Extender: G Primary Care FULLER, SUSAN Physician: Referring Physician: [name] Weeks in Treatment: 0 Allergies Active Allergies No known Allergies Allergy Notes Electronic Signature(s) Signed: 03/24/2016 4:23:09 PM By: Elpidio Eric BSN, RN Entered By: Elpidio Eric on 03/24/2016 08:59:40 Crichlow, [name] -------------------------------------------------------------------------------- Arrival Information Details [name] Date of Service: 03/24/2016 8:45 AM Patient Name: [name] Account Number: [id number] Medical Record Treating RN: Clover Mealy RN, BSN, [place] Sink [id number] Number: Other Clinician: March 13, 1921 (82 y.o. Treating [name], [name] Date of Birth/Sex: Female) Physician/Extender: G Primary Care FULLER, SUSAN Physician: Referring Physician: Meliton Rattan in Treatment: 0 Visit Information Patient Arrived: Wheel Chair Arrival Time: 09:00 Accompanied By: son Transfer Assistance: None Patient Identification Verified: Yes Secondary Verification Process Yes Completed: Patient Requires Transmission-Based No Precautions: Patient Has Alerts: No Electronic Signature(s) Signed: 03/24/2016 4:23:09 PM By: Elpidio Eric BSN, RN Entered By: Elpidio Eric on 03/24/2016 09:00:47 Stansell, [name] -------------------------------------------------------------------------------- Clinic Level of Care Assessment Details [name] Date of Service: 03/24/2016 8:45 AM Patient Name: [name] Account Number: [id number] Medical Record Treating RN: Clover Mealy RN, BSN,  [place] Sink [id number] Number: Other Clinician: 1921/05/29 (82 y.o. Treating [name], [name] Date of Birth/Sex: Female) Physician/Extender: G Primary Care FULLER, SUSAN Physician: Referring Physician: Meliton Rattan in Treatment: 0 Clinic Level of Care Assessment Items TOOL 1 Quantity Score []  - Use when EandM and Procedure is performed on INITIAL visit 0 ASSESSMENTS - Nursing Assessment / Reassessment X - General Physical Exam (combine w/ comprehensive assessment (listed just 1 20 below) when performed on new pt. evals) X - Comprehensive Assessment (HX, ROS, Risk Assessments, Wounds Hx, etc.) 1 25 ASSESSMENTS - Wound and Skin Assessment / Reassessment []  - Dermatologic / Skin Assessment (not related to wound area) 0 ASSESSMENTS - Ostomy and/or Continence Assessment and Care []  - Incontinence Assessment and Management 0 []  - Ostomy Care Assessment and Management (repouching, etc.) 0 PROCESS - Coordination of Care X - Simple Patient / Family Education for ongoing care 1 15 []  - Complex (extensive) Patient / Family Education for ongoing care 0 X - Staff obtains Chiropractor, Records, Test Results / Process Orders 1 10 X - Staff telephones HHA, Nursing Homes / Clarify orders / etc 1 10 []  - Routine Transfer to another Facility (non-emergent condition) 0 []  - Routine Hospital Admission (non-emergent condition) 0 X - New Admissions / Manufacturing engineer / Ordering NPWT, Apligraf, etc. 1 15 []  - Emergency Hospital Admission (emergent condition) 0 PROCESS - Special Needs ROSEANNE, JUENGER. ([id number]) []  - Pediatric / Minor Patient Management 0 []  - Isolation Patient Management 0 []  - Hearing / Language / Visual special needs 0 []  - Assessment of Community assistance (transportation, D/C planning, etc.) 0 []  - Additional assistance / Altered mentation 0 []  - Support Surface(s) Assessment (bed, cushion, seat, etc.) 0 INTERVENTIONS - Miscellaneous []  - External ear exam 0 []  -  Patient Transfer (multiple staff / Nurse, adult / Similar devices) 0 []  - Simple Staple / Suture removal (25 or less) 0 []  - Complex Staple / Suture removal (26 or more) 0 []  - Hypo/Hyperglycemic Management (do not check if billed separately) 0 X - Ankle / Brachial Index (ABI) - do not check if billed separately 1 15  Has the patient been seen at the hospital within the last three years: Yes Total Score: 110 Level Of Care: New/Established - Level 3 Electronic Signature(s) Signed: 03/24/2016 4:23:09 PM By: Elpidio Eric BSN, RN Entered By: Elpidio Eric on 03/24/2016 10:02:08 [name] ([id number]) -------------------------------------------------------------------------------- Encounter Discharge Information Details [name] Date of Service: 03/24/2016 8:45 AM Patient Name: [name] Account Number: [id number] Medical Record Treating RN: [name] [id number] Number: Other Clinician: 21-Feb-1921 (82 y.o. Treating [name], [name] Date of Birth/Sex: Female) Physician/Extender: G Primary Care FULLER, SUSAN Physician: Referring Physician: Meliton Rattan in Treatment: 0 Encounter Discharge Information Items Discharge Pain Level: 0 Discharge Condition: Stable Ambulatory Status: Wheelchair Discharge Destination: Home Transportation: Private Auto Accompanied By: son Schedule Follow-up Appointment: No Medication Reconciliation completed No and provided to Patient/Care Ennifer Harston: Provided on Clinical Summary of Care: 03/24/2016 Form Type Recipient Paper Patient ES Electronic Signature(s) Signed: 03/24/2016 4:23:09 PM By: Elpidio Eric BSN, RN Previous Signature: 03/24/2016 10:03:19 AM Version By: Gwenlyn Perking Entered By: Elpidio Eric on 03/24/2016 10:06:04 [place], [name] -------------------------------------------------------------------------------- Lower Extremity Assessment Details [name] Date of Service: 03/24/2016 8:45 AM Patient  Name: [name] Account Number: [id number] Medical Record Treating RN: Clover Mealy RN, BSN, [place] Sink [id number] Number: Other Clinician: [birth year] (82 y.o. Treating [name], [name] Date of Birth/Sex: Female) Physician/Extender: G Primary Care FULLER, SUSAN Physician: Referring Physician: [name] Weeks in Treatment: 0 Vascular Assessment Pulses: Posterior Tibial Dorsalis Pedis Palpable: [Left:Yes] [Right:Yes] Extremity colors, hair growth, and conditions: Extremity Color: [Left:Mottled] [Right:Mottled] Hair Growth on Extremity: [Left:Yes] [Right:Yes] Temperature of Extremity: [Left:Warm] [Right:Warm] Capillary Refill: [Left:< 3 seconds] [Right:< 3 seconds] Blood Pressure: Brachial: [Left:154] [Right:152] Dorsalis Pedis: 145 [Left:Dorsalis Pedis:] Ankle: Posterior Tibial: [Left:Posterior Tibial: 0.94] Toe Nail Assessment Left: Right: Thick: Yes Yes Discolored: Yes Yes Deformed: No No Improper Length and Hygiene: Yes Yes Notes No ABIS on the right due to wound location and pain. Electronic Signature(s) Signed: 07/17/2016 5:33:36 PM By: Elpidio Eric BSN, RN Previous Signature: 03/24/2016 4:23:09 PM Version By: Elpidio Eric BSN, RN Entered By: Elpidio Eric on 04/01/2016 16:15:53 Ossa, [name] -------------------------------------------------------------------------------- Multi Wound Chart Details [name] Date of Service: 03/24/2016 8:45 AM Patient Name: [name] Account Number: [id number] Medical Record Treating RN: Clover Mealy RN, BSN, [place] Sink [id number] Number: Other Clinician: [birth year] (82 y.o. Treating [name], [name] Date of Birth/Sex: Female) Physician/Extender: G Primary Care FULLER, SUSAN Physician: Referring Physician: [name] Weeks in Treatment: 0 Vital Signs Height(in): 68 Pulse(bpm): 77 Weight(lbs): 118 Blood Pressure 154/71 (mmHg): Body Mass Index(BMI): 18 Temperature(F): 97.7 Respiratory  Rate 18 (breaths/min): Photos: [1:No Photos] [2:No Photos] [3:No Photos] Wound Location: [1:Right Calcaneus] [2:Right Lower Leg - Lateral, Proximal] [3:Lower Leg - Lateral, Distal] Wounding Event: [1:Pressure Injury] [2:Pressure Injury] [3:Pressure Injury] Primary Etiology: [1:Pressure Ulcer] [2:Pressure Ulcer] [3:Pressure Ulcer] Date Acquired: [1:01/02/2016] [2:01/02/2016] [3:01/02/2016] Weeks of Treatment: [1:0] [2:0] [3:0] Wound Status: [1:Open] [2:Open] [3:Open] Measurements L x W x D 2.5x3.5x0.8 [2:2x1x0.8] [3:2.5x2x1] (cm) Area (cm) : [1:6.872] [2:1.571] [3:3.927] Volume (cm) : [1:5.498] [2:1.257] [3:3.927] Classification: [1:Category/Stage IV] [2:Category/Stage IV] [3:Category/Stage III] Exudate Amount: [1:Large] [2:Medium] [3:Medium] Exudate Type: [1:Serosanguineous] [2:Serosanguineous] [3:Serosanguineous] Exudate Color: [1:red, brown] [2:red, brown] [3:red, brown] Foul Odor After [1:Yes] [2:Yes] [3:Yes] Cleansing: Odor Anticipated Due to No [2:No] [3:No] Product Use: Wound Margin: [1:Distinct, outline attached] [2:Distinct, outline attached] [3:Distinct, outline attached] Granulation Amount: [1:Small (1-33%)] [2:Small (1-33%)] [3:Small (1-33%)] Granulation Quality: [1:Pink, Pale] [2:Pale] [3:Pink, Pale] Necrotic Amount: [1:Large (67-100%)] [2:Large (67-100%)] [3:Large (67-100%)] Exposed Structures: [1:Tendon: Yes Fascia: No] [2:Tendon: Yes Fascia: No] [  3:Fascia: No Fat: No] Fat: No Fat: No Tendon: No Muscle: No Muscle: No Muscle: No Joint: No Joint: No Joint: No Bone: No Bone: No Bone: No Limited to Skin Breakdown Epithelialization: None None None Periwound Skin Texture: Edema: Yes Edema: No Edema: No Excoriation: No Excoriation: No Excoriation: No Induration: No Induration: No Induration: No Callus: No Callus: No Callus: No Crepitus: No Crepitus: No Crepitus: No Fluctuance: No Fluctuance: No Fluctuance: No Friable: No Friable: No Friable:  No Rash: No Rash: No Rash: No Scarring: No Scarring: No Scarring: No Periwound Skin Moist: Yes Moist: Yes Moist: Yes Moisture: Maceration: No Maceration: No Maceration: No Dry/Scaly: No Dry/Scaly: No Dry/Scaly: No Periwound Skin Color: Atrophie Blanche: No Atrophie Blanche: No Atrophie Blanche: No Cyanosis: No Cyanosis: No Cyanosis: No Ecchymosis: No Ecchymosis: No Ecchymosis: No Erythema: No Erythema: No Erythema: No Hemosiderin Staining: No Hemosiderin Staining: No Hemosiderin Staining: No Mottled: No Mottled: No Mottled: No Pallor: No Pallor: No Pallor: No Rubor: No Rubor: No Rubor: No Temperature: No Abnormality No Abnormality No Abnormality Tenderness on Yes Yes Yes Palpation: Wound Preparation: Ulcer Cleansing: Ulcer Cleansing: Ulcer Cleansing: Rinsed/Irrigated with Rinsed/Irrigated with Rinsed/Irrigated with Saline Saline Saline Topical Anesthetic Topical Anesthetic Topical Anesthetic Applied: Other: lidocaine 4 Applied: Other: lidocaine Applied: Other: lidocaine % 4% 4% Wound Number: 4 N/A N/A Photos: No Photos N/A N/A Wound Location: Right Calcaneus - Medial N/A N/A Wounding Event: Pressure Injury N/A N/A Primary Etiology: Pressure Ulcer N/A N/A Date Acquired: 01/02/2016 N/A N/A Weeks of Treatment: 0 N/A N/A Wound Status: Open N/A N/A Measurements L x W x D 2x1x0.1 N/A N/A (cm) Area (cm) : 1.571 N/A N/A Volume (cm) : 0.157 N/A N/A Labat, Cynthya L. ([id number]) Classification: Category/Stage II N/A N/A Exudate Amount: Medium N/A N/A Exudate Type: Serosanguineous N/A N/A Exudate Color: red, brown N/A N/A Foul Odor After No N/A N/A Cleansing: Odor Anticipated Due to N/A N/A N/A Product Use: Wound Margin: Distinct, outline attached N/A N/A Granulation Amount: None Present (0%) N/A N/A Granulation Quality: N/A N/A N/A Necrotic Amount: Large (67-100%) N/A N/A Exposed Structures: Fascia: No N/A N/A Fat: No Tendon: No Muscle:  No Joint: No Bone: No Limited to Skin Breakdown Epithelialization: None N/A N/A Periwound Skin Texture: Edema: No N/A N/A Excoriation: No Induration: No Callus: No Crepitus: No Fluctuance: No Friable: No Rash: No Scarring: No Periwound Skin Moist: Yes N/A N/A Moisture: Maceration: No Dry/Scaly: No Periwound Skin Color: Atrophie Blanche: No N/A N/A Cyanosis: No Ecchymosis: No Erythema: No Hemosiderin Staining: No Mottled: No Pallor: No Rubor: No Temperature: No Abnormality N/A N/A Tenderness on Yes N/A N/A Palpation: Wound Preparation: Ulcer Cleansing: N/A N/A Rinsed/Irrigated with Saline Topical Anesthetic [name], Ceara L. ([id number]) Applied: Other: Lidocaine 4% Treatment Notes Electronic Signature(s) Signed: 03/24/2016 4:23:09 PM By: Elpidio Eric BSN, RN Entered By: Elpidio Eric on 03/24/2016 09:43:41 [place], [name] ([id number]) -------------------------------------------------------------------------------- Multi-Disciplinary Care Plan Details [name] Date of Service: 03/24/2016 8:45 AM Patient Name: [name] Account Number: [id number] Medical Record Treating RN: Clover Mealy RN, BSN, [place] Sink [id number] Number: Other Clinician: 12/02/1920 (82 y.o. Treating [name], [name] Date of Birth/Sex: Female) Physician/Extender: G Primary Care FULLER, SUSAN Physician: Referring Physician: Meliton Rattan in Treatment: 0 Active Inactive Orientation to the Wound Care Program Nursing Diagnoses: Knowledge deficit related to the wound healing center program Goals: Patient/caregiver will verbalize understanding of the Wound Healing Center Program Date Initiated: 03/24/2016 Goal Status: Active Interventions: Provide education on orientation to the wound center Notes: Pressure Nursing Diagnoses: Knowledge deficit related  to causes and risk factors for pressure ulcer development Knowledge deficit related to management of pressures ulcers Potential for  impaired tissue integrity related to pressure, friction, moisture, and shear Goals: Patient will remain free from development of additional pressure ulcers Date Initiated: 03/24/2016 Goal Status: Active Patient will remain free of pressure ulcers Date Initiated: 03/24/2016 Goal Status: Active Patient/caregiver will verbalize risk factors for pressure ulcer development Date Initiated: 03/24/2016 Goal Status: Active Patient/caregiver will verbalize understanding of pressure ulcer management LURA, FALOR ([id number]) Date Initiated: 03/24/2016 Goal Status: Active Interventions: Assess: immobility, friction, shearing, incontinence upon admission and as needed Assess offloading mechanisms upon admission and as needed Assess potential for pressure ulcer upon admission and as needed Provide education on pressure ulcers Treatment Activities: Patient referred for home evaluation of offloading devices/mattresses : 03/24/2016 Patient referred for pressure reduction/relief devices : 03/24/2016 Patient referred for seating evaluation to ensure proper offloading : 03/24/2016 Pressure reduction/relief device ordered : 03/24/2016 Notes: Wound/Skin Impairment Nursing Diagnoses: Impaired tissue integrity Knowledge deficit related to smoking impact on wound healing Knowledge deficit related to ulceration/compromised skin integrity Goals: Patient/caregiver will verbalize understanding of skin care regimen Date Initiated: 03/24/2016 Goal Status: Active Ulcer/skin breakdown will have a volume reduction of 30% by week 4 Date Initiated: 03/24/2016 Goal Status: Active Ulcer/skin breakdown will have a volume reduction of 50% by week 8 Date Initiated: 03/24/2016 Goal Status: Active Ulcer/skin breakdown will have a volume reduction of 80% by week 12 Date Initiated: 03/24/2016 Goal Status: Active Ulcer/skin breakdown will heal within 14 weeks Date Initiated: 03/24/2016 Goal Status: Active Interventions: Assess  patient/caregiver ability to obtain necessary supplies Assess patient/caregiver ability to perform ulcer/skin care regimen upon admission and as needed Assess ulceration(s) every visit CHLORA, MCBAIN ([id number]) Provide education on ulcer and skin care Treatment Activities: Patient referred to home care : 03/24/2016 Skin care regimen initiated : 03/24/2016 Topical wound management initiated : 03/24/2016 Notes: Electronic Signature(s) Signed: 03/24/2016 4:23:09 PM By: Elpidio Eric BSN, RN Entered By: Elpidio Eric on 03/24/2016 09:40:11 Acklin, [name] -------------------------------------------------------------------------------- Pain Assessment Details [name] Date of Service: 03/24/2016 8:45 AM Patient Name: [name] Account Number: [id number] Medical Record Treating RN: Clover Mealy RN, BSN, [place] Sink [id number] Number: Other Clinician: 09-12-1920 (82 y.o. Treating [name], [name] Date of Birth/Sex: Female) Physician/Extender: G Primary Care FULLER, SUSAN Physician: Referring Physician: [name] Weeks in Treatment: 0 Active Problems Location of Pain Severity and Description of Pain Patient Has Paino No Site Locations With Dressing Change: No Pain Management and Medication Current Pain Management: Electronic Signature(s) Signed: 03/24/2016 4:23:09 PM By: Elpidio Eric BSN, RN Entered By: Elpidio Eric on 03/24/2016 09:00:31 Gertner, [name] -------------------------------------------------------------------------------- Patient/Caregiver Education Details [name] Date of Service: 03/24/2016 8:45 AM Patient Name: [name] Account Number: [id number] Medical Record Treating RN: Clover Mealy RN, BSN, [place] Sink [id number] Number: Other Clinician: 1921/01/14 (82 y.o. Treating [name], [name] Date of Birth/Gender: Female) Physician/Extender: G Primary Care Physician: Meliton Rattan in Treatment: 0 Referring Physician: [name] Education Assessment Education Provided To: Patient Education Topics Provided Pressure: Methods: Explain/Verbal Responses: State content correctly Welcome To The Wound Care Center: Methods: Explain/Verbal Responses: State content correctly Wound/Skin Impairment: Methods: Explain/Verbal Responses: State content correctly Electronic Signature(s) Signed: 03/24/2016 4:23:09 PM By: Elpidio Eric BSN, RN Entered By: Elpidio Eric on 03/24/2016 10:06:27 Sahli, [name] -------------------------------------------------------------------------------- Wound Assessment Details [name] Date of Service: 03/24/2016 8:45 AM Patient Name: [name] Account Number: [id number] Medical Record Treating RN: Clover Mealy RN, BSN, [place] Sink [id number] Number:  Other Clinician: 06-Dec-1920 (82 y.o. Treating [name], [name] Date of Birth/Sex: Female) Physician/Extender: G Primary Care FULLER, SUSAN Physician: Referring Physician: [name] Weeks in Treatment: 0 Wound Status Wound Number: 1 Primary Etiology: Pressure Ulcer Wound Location: Right Calcaneus Wound Status: Open Wounding Event: Pressure Injury Date Acquired: 01/02/2016 Weeks Of Treatment: 0 Clustered Wound: No Photos Photo Uploaded By: Elpidio Eric on 03/25/2016 11:56:44 Wound Measurements Length: (cm) 2.5 Width: (cm) 3.5 Depth: (cm) 0.8 Area: (cm) 6.872 Volume: (cm) 5.498 % Reduction in Area: % Reduction in Volume: Epithelialization: None Tunneling: No Undermining: No Wound Description Classification: Category/Stage IV Wound Margin: Distinct, outline attached Exudate Amount: Large Exudate Type: Serosanguineous Exudate Color: red, brown Foul Odor After Cleansing: Yes Due to Product Use: No Wound Bed Granulation Amount: Small (1-33%) Exposed Structure Mingle, Maxine L. ([id number]) Granulation Quality: Pink, Pale Fascia Exposed: No Necrotic Amount: Large (67-100%) Fat Layer Exposed:  No Necrotic Quality: Adherent Slough Tendon Exposed: Yes Muscle Exposed: No Joint Exposed: No Bone Exposed: No Periwound Skin Texture Texture Color No Abnormalities Noted: No No Abnormalities Noted: No Callus: No Atrophie Blanche: No Crepitus: No Cyanosis: No Excoriation: No Ecchymosis: No Fluctuance: No Erythema: No Friable: No Hemosiderin Staining: No Induration: No Mottled: No Localized Edema: Yes Pallor: No Rash: No Rubor: No Scarring: No Temperature / Pain Moisture Temperature: No Abnormality No Abnormalities Noted: No Tenderness on Palpation: Yes Dry / Scaly: No Maceration: No Moist: Yes Wound Preparation Ulcer Cleansing: Rinsed/Irrigated with Saline Topical Anesthetic Applied: Other: lidocaine 4 %, Electronic Signature(s) Signed: 03/24/2016 4:23:09 PM By: Elpidio Eric BSN, RN Entered By: Elpidio Eric on 03/24/2016 09:12:32 Cafarella, [name] ([id number]) -------------------------------------------------------------------------------- Wound Assessment Details [name] Date of Service: 03/24/2016 8:45 AM Patient Name: [name] Account Number: [id number] Medical Record Treating RN: Clover Mealy RN, BSN, [place] Sink [id number] Number: Other Clinician: [birth year] (82 y.o. Treating [name], [name] Date of Birth/Sex: Female) Physician/Extender: G Primary Care FULLER, SUSAN Physician: Referring Physician: [name] Weeks in Treatment: 0 Wound Status Wound Number: 2 Primary Etiology: Pressure Ulcer Wound Location: Right Lower Leg - Lateral, Wound Status: Open Proximal Wounding Event: Pressure Injury Date Acquired: 01/02/2016 Weeks Of Treatment: 0 Clustered Wound: No Photos Photo Uploaded By: Elpidio Eric on 03/25/2016 11:57:31 Wound Measurements Length: (cm) 2 Width: (cm) 1 Depth: (cm) 0.8 Area: (cm) 1.571 Volume: (cm) 1.257 % Reduction in Area: % Reduction in Volume: Epithelialization: None Tunneling: No Undermining: No Wound  Description AKEEMA, BRODER. ([id number]) Classification: Category/Stage IV Foul Odor After Cleansing: Yes Wound Margin: Distinct, outline attached Due to Product Use: No Exudate Amount: Medium Exudate Type: Serosanguineous Exudate Color: red, brown Wound Bed Granulation Amount: Small (1-33%) Exposed Structure Granulation Quality: Pale Fascia Exposed: No Necrotic Amount: Large (67-100%) Fat Layer Exposed: No Necrotic Quality: Adherent Slough Tendon Exposed: Yes Muscle Exposed: No Joint Exposed: No Bone Exposed: No Periwound Skin Texture Texture Color No Abnormalities Noted: No No Abnormalities Noted: No Callus: No Atrophie Blanche: No Crepitus: No Cyanosis: No Excoriation: No Ecchymosis: No Fluctuance: No Erythema: No Friable: No Hemosiderin Staining: No Induration: No Mottled: No Localized Edema: No Pallor: No Rash: No Rubor: No Scarring: No Temperature / Pain Moisture Temperature: No Abnormality No Abnormalities Noted: No Tenderness on Palpation: Yes Dry / Scaly: No Maceration: No Moist: Yes Wound Preparation Ulcer Cleansing: Rinsed/Irrigated with Saline Topical Anesthetic Applied: Other: lidocaine 4%, Electronic Signature(s) Signed: 03/24/2016 4:23:09 PM By: Elpidio Eric BSN, RN Entered By: Elpidio Eric on 03/24/2016 09:14:46 [name], [name] -------------------------------------------------------------------------------- Wound Assessment Details Kreh, Ainsley Date of Service:  03/24/2016 8:45 AM Patient Name: [name] Account Number: [id number] Medical Record Treating RN: Clover Mealyfful, RN, BSN, [place] SinkRita [id number] Number: Other Clinician: 04/14/1921 (82 y.o. Treating [name], [name] Date of Birth/Sex: Female) Physician/Extender: G Primary Care FULLER, SUSAN Physician: Referring Physician: Tomi BambergerFULLER, SUSAN Weeks in Treatment: 0 Wound Status Wound Number: 3 Primary Etiology: Pressure Ulcer Wound Location: Lower Leg - Lateral,  Distal Wound Status: Open Wounding Event: Pressure Injury Date Acquired: 01/02/2016 Weeks Of Treatment: 0 Clustered Wound: No Photos Photo Uploaded By: Elpidio EricAfful, Rita on 03/25/2016 11:57:31 Wound Measurements Length: (cm) 2.5 Width: (cm) 2 Depth: (cm) 1 Area: (cm) 3.927 Volume: (cm) 3.927 % Reduction in Area: % Reduction in Volume: Epithelialization: None Tunneling: No Undermining: No Wound Description Classification: Category/Stage III Huizar, Dionna L. ([id number]) Foul Odor After Cleansing: Yes Wound Margin: Distinct, outline attached Due to Product Use: No Exudate Amount: Medium Exudate Type: Serosanguineous Exudate Color: red, brown Wound Bed Granulation Amount: Small (1-33%) Exposed Structure Granulation Quality: Pink, Pale Fascia Exposed: No Necrotic Amount: Large (67-100%) Fat Layer Exposed: No Necrotic Quality: Adherent Slough Tendon Exposed: No Muscle Exposed: No Joint Exposed: No Bone Exposed: No Limited to Skin Breakdown Periwound Skin Texture Texture Color No Abnormalities Noted: No No Abnormalities Noted: No Callus: No Atrophie Blanche: No Crepitus: No Cyanosis: No Excoriation: No Ecchymosis: No Fluctuance: No Erythema: No Friable: No Hemosiderin Staining: No Induration: No Mottled: No Localized Edema: No Pallor: No Rash: No Rubor: No Scarring: No Temperature / Pain Moisture Temperature: No Abnormality No Abnormalities Noted: No Tenderness on Palpation: Yes Dry / Scaly: No Maceration: No Moist: Yes Wound Preparation Ulcer Cleansing: Rinsed/Irrigated with Saline Topical Anesthetic Applied: Other: lidocaine 4%, Electronic Signature(s) Signed: 03/24/2016 4:23:09 PM By: Elpidio EricAfful, Rita BSN, RN Entered By: Elpidio EricAfful, Rita on 03/24/2016 09:16:44 [name], [name] ([id number]) -------------------------------------------------------------------------------- Wound Assessment Details [name], [name] Date of Service: 03/24/2016 8:45  AM Patient Name: [name] Account Number: [id number] Medical Record Treating RN: Clover Mealyfful, RN, BSN, [place] SinkRita [id number] Number: Other Clinician: 04/14/1921 (82 y.o. Treating [name], [name] Date of Birth/Sex: Female) Physician/Extender: G Primary Care FULLER, SUSAN Physician: Referring Physician: Tomi BambergerFULLER, SUSAN Weeks in Treatment: 0 Wound Status Wound Number: 4 Primary Etiology: Pressure Ulcer Wound Location: Right Calcaneus - Medial Wound Status: Open Wounding Event: Pressure Injury Date Acquired: 01/02/2016 Weeks Of Treatment: 0 Clustered Wound: No Photos Photo Uploaded By: Elpidio EricAfful, Rita on 03/25/2016 11:57:32 Wound Measurements Length: (cm) 2 Width: (cm) 1 Depth: (cm) 0.1 Area: (cm) 1.571 Volume: (cm) 0.157 % Reduction in Area: % Reduction in Volume: Epithelialization: None Tunneling: No Undermining: No Wound Description Classification: Category/Stage II Santore, Taliana L. ([id number]) Foul Odor After Cleansing: No Wound Margin: Distinct, outline attached Exudate Amount: Medium Exudate Type: Serosanguineous Exudate Color: red, brown Wound Bed Granulation Amount: None Present (0%) Exposed Structure Necrotic Amount: Large (67-100%) Fascia Exposed: No Necrotic Quality: Adherent Slough Fat Layer Exposed: No Tendon Exposed: No Muscle Exposed: No Joint Exposed: No Bone Exposed: No Limited to Skin Breakdown Periwound Skin Texture Texture Color No Abnormalities Noted: No No Abnormalities Noted: No Callus: No Atrophie Blanche: No Crepitus: No Cyanosis: No Excoriation: No Ecchymosis: No Fluctuance: No Erythema: No Friable: No Hemosiderin Staining: No Induration: No Mottled: No Localized Edema: No Pallor: No Rash: No Rubor: No Scarring: No Temperature / Pain Moisture Temperature: No Abnormality No Abnormalities Noted: No Tenderness on Palpation: Yes Dry / Scaly: No Maceration: No Moist: Yes Wound Preparation Ulcer Cleansing: Rinsed/Irrigated with  Saline Topical Anesthetic Applied: Other: Lidocaine 4%, Electronic Signature(s) Signed: 03/24/2016 4:23:09 PM By: Elpidio EricAfful, Rita  BSN, RN Entered By: Elpidio Eric on 03/24/2016 09:18:47 [name], [name] ([id number]) -------------------------------------------------------------------------------- Vitals Details [name] Date of Service: 03/24/2016 8:45 AM Patient Name: [name] Account Number: [id number] Medical Record Treating RN: Clover Mealy RN, BSN,  Sink [id number] Number: Other Clinician: 1921-01-28 (82 y.o. Treating [name], [name] Date of Birth/Sex: Female) Physician/Extender: G Primary Care FULLER, SUSAN Physician: Referring Physician: [name] Weeks in Treatment: 0 Vital Signs Time Taken: 08:59 Temperature (F): 97.7 Height (in): 68 Pulse (bpm): 77 Source: Stated Respiratory Rate (breaths/min): 18 Weight (lbs): 118 Blood Pressure (mmHg): 154/71 Source: Stated Reference Range: 80 - 120 mg / dl Body Mass Index (BMI): 17.9 Electronic Signature(s) Signed: 03/24/2016 4:23:09 PM By: Elpidio Eric BSN, RN Entered By: Elpidio Eric on 03/24/2016 09:00:16

## 2016-03-25 NOTE — Progress Notes (Signed)
[name], [name] ([id number]) Visit Report for 03/24/2016 Chief Complaint Document Details [name], [name] Date of Service: 03/24/2016 8:45 AM Patient Name: [name] Account Number: [id number] Medical Record Treating RN: [name] [id number] Number: Other Clinician: 25-Apr-1921 (82 y.o. Treating [name], [name] Date of Birth/Sex: Female) Physician/Extender: G Primary Care FULLER, SUSAN Physician: Referring Physician: Weeks in Treatment: 0 Information Obtained from: Patient Chief Complaint 03/24/16; patient is here for review wounds on the right lower extremity Electronic Signature(s) Signed: 03/25/2016 7:55:14 AM By: Baltazar Najjar MD Entered By: Baltazar Najjar on 03/24/2016 10:49:20 [name], [name] ([id number]) -------------------------------------------------------------------------------- Debridement Details [name] Date of Service: 03/24/2016 8:45 AM Patient Name: [name] Account Number: [id number] Medical Record Treating RN: Clover Mealy RN, BSN, [place] Sink [id number] Number: Other Clinician: 1920-08-25 (82 y.o. Treating [name], [name] Date of Birth/Sex: Female) Physician/Extender: G Primary Care FULLER, SUSAN Physician: Referring Physician: Weeks in Treatment: 0 Debridement Performed for Wound #3 Distal,Lateral Lower Leg Assessment: Performed By: Physician [name], MD Debridement: Debridement Pre-procedure Yes - 09:40 Verification/Time Out Taken: Start Time: 09:40 Pain Control: Lidocaine 4% Topical Solution Level: Skin/Subcutaneous Tissue Total Area Debrided (L x 2.5 (cm) x 2 (cm) = 5 (cm) W): Tissue and other Non-Viable, Exudate, Fibrin/Slough, Subcutaneous material debrided: Instrument: Curette Bleeding: Minimum Hemostasis Achieved: Pressure End Time: 09:46 Procedural Pain: 0 Post Procedural Pain: 0 Response to Treatment: Procedure was tolerated well Post Debridement Measurements of Total Wound Length: (cm) 2.5 Stage:  Category/Stage III Width: (cm) 2 Depth: (cm) 1 Volume: (cm) 3.927 Character of Wound/Ulcer Post Requires Further Debridement: Debridement Severity of Tissue Post Limited to breakdown of skin Debridement: Post Procedure Diagnosis Same as Pre-procedure [name], [name] ([id number]) Electronic Signature(s) Signed: 03/24/2016 4:23:09 PM By: Elpidio Eric BSN, RN Signed: 03/25/2016 7:55:14 AM By: Baltazar Najjar MD Entered By: Elpidio Eric on 03/24/2016 09:47:07 [name], [name] ([id number]) -------------------------------------------------------------------------------- Debridement Details [name] Date of Service: 03/24/2016 8:45 AM Patient Name: [name] Account Number: [id number] Medical Record Treating RN: Clover Mealy RN, BSN, [place] Sink [id number] Number: Other Clinician: 21-Jun-1921 (82 y.o. Treating [name], [name] Date of Birth/Sex: Female) Physician/Extender: G Primary Care FULLER, SUSAN Physician: Referring Physician: Weeks in Treatment: 0 Debridement Performed for Wound #1 Right,Lateral Calcaneus Assessment: Performed By: Physician [name], MD Debridement: Debridement Pre-procedure Yes - 09:40 Verification/Time Out Taken: Start Time: 09:40 Pain Control: Lidocaine 4% Topical Solution Level: Skin/Subcutaneous Tissue Total Area Debrided (L x 2.5 (cm) x 3.5 (cm) = 8.75 (cm) W): Tissue and other Non-Viable, Exudate, Fibrin/Slough, Subcutaneous material debrided: Instrument: Curette Bleeding: Minimum Hemostasis Achieved: Pressure End Time: 09:46 Procedural Pain: 0 Post Procedural Pain: 0 Response to Treatment: Procedure was tolerated well Post Debridement Measurements of Total Wound Length: (cm) 2.5 Stage: Category/Stage IV Width: (cm) 3.5 Depth: (cm) 0.8 Volume: (cm) 5.498 Character of Wound/Ulcer Post Requires Further Debridement: Debridement Severity of Tissue Post Limited to breakdown of skin Debridement: Post Procedure  Diagnosis Same as Pre-procedure [name], [name] ([id number]) Electronic Signature(s) Signed: 03/24/2016 4:23:09 PM By: Elpidio Eric BSN, RN Signed: 03/25/2016 7:55:14 AM By: Baltazar Najjar MD Entered By: Elpidio Eric on 03/24/2016 09:47:50 [name], [name] ([id number]) -------------------------------------------------------------------------------- HPI Details [name] Date of Service: 03/24/2016 8:45 AM Patient Name: [name] Account Number: [id number] Medical Record Treating RN: [name] [id number] Number: Other Clinician: Jan 11, 1921 (82 y.o. Treating [name], [name] Date of Birth/Sex: Female) Physician/Extender: G Primary Care FULLER, SUSAN Physician: Referring Physician: Weeks in Treatment: 0 History of Present Illness HPI Description: 03/24/16; this is an elderly 82 year old woman with advanced dementia who  was hospitalized from 5/8 through 5/13. At that point she had Proteus sepsis felt to be secondary to a UTI the. Acute kidney injury and delirium. Her son who is her primary caregiver says she came home from the hospital with wounds on her right lower extremity and apparently her right buttock as well. There is no mention on the hospital discharge summary of problems. She has been having [facility] and have been using silver alginate based dressings. Apparently the area on the right buttock is healing and the son stated we did not need to become involved with that. In any case the patient has for wounds to on the right heel and 2 on the posterior lateral right calf, the latter of which is not a usual pressure area however that is the history. She is apparently eating and drinking fairly well. Takes ensure well. She does not have any pressure-relief surface at home. I have reviewed her lab work from the hospital. Her admission albumin was 3.4 on 5/8 Electronic Signature(s) Signed: 03/25/2016 7:55:14 AM By: Baltazar Najjar MD Entered By: Baltazar Najjar  on 03/24/2016 10:56:30 Birman, [name] ([id number]) -------------------------------------------------------------------------------- Physical Exam Details [name] Date of Service: 03/24/2016 8:45 AM Patient Name: [name] Account Number: [id number] Medical Record Treating RN: [name] [id number] Number: Other Clinician: [birth year] (82 y.o. Treating [name], [name] Date of Birth/Sex: Female) Physician/Extender: G Primary Care FULLER, SUSAN Physician: Referring Physician: Weeks in Treatment: 0 Constitutional Patient is hypertensive.. Pulse regular and within target range for patient.Marland Kitchen Respirations regular, non-labored and within target range.. Temperature is normal and within the target range for the patient.. Patient's appearance is neat and clean. Appears in no acute distress. Well nourished and well developed.. Eyes Conjunctivae clear. No discharge.. Ears, Nose, Mouth, and Throat Edentulous no oral lesions. Neck Neck supple and symmetrical. No masses or crepitus. Thyroid within normal limits, without enlargement, tenderness or masses.Marland Kitchen Respiratory Respiratory effort is easy and symmetric bilaterally. Rate is normal at rest and on room air.. Bilateral breath sounds are clear and equal in all lobes with no wheezes, rales or rhonchi.. Cardiovascular Heart rhythm and rate regular, without murmur or gallop.. Femoral arteries without bruits and pulses strong.. Pedal pulses absent bilaterally.. Her lower extremities look quite stable no major edema. No overt stasis physiology. Gastrointestinal (GI) Abdomen is soft and non-distended without masses or tenderness. Bowel sounds active in all quadrants.. No liver or spleen enlargement or tenderness.. Genitourinary (GU) Bladder without fullness, masses or tenderness.. Lymphatic . No lymphadenopathy or glandular swelling noted in neck.. Psychiatric Severe Alzheimer's disease. Notes Wound exam; oOn the right heel  there are 2 areas here. Lateral aspect a stage III wound with some depth. On the Achilles area stage II wound. The area on the lateral aspect of the heel was debrided of surface slough oOn the posterior lateral aspect of the right calf to deep open areas with exposed lateral tendon at the base. [name], [name] ([id number]) Although these were also debrided however there is no overt evidence of infection. Not a usual site for pressure ulcers. Electronic Signature(s) Signed: 03/25/2016 7:55:14 AM By: Baltazar Najjar MD Entered By: Baltazar Najjar on 03/24/2016 11:00:17 [name], [name] ([id number]) -------------------------------------------------------------------------------- Physician Orders Details [name] Date of Service: 03/24/2016 8:45 AM Patient Name: [name] Account Number: [id number] Medical Record Treating RN: Clover Mealy RN, BSN, [place] Sink [id number] Number: Other Clinician: 1921-05-24 (82 y.o. Treating [name], [name] Date of Birth/Sex: Female) Physician/Extender: G Primary Care FULLER, SUSAN Physician: Referring Physician: Weeks in Treatment:  0 Verbal / Phone Orders: Yes Clinician: Afful, RN, BSN, [place] Sinkita Read Back and Verified: Yes Diagnosis Coding ICD-10 Coding Code Description [phone] Pressure ulcer of right heel, stage 2 G30.9 Alzheimer's disease, unspecified Wound Cleansing Wound #1 Right,Lateral Calcaneus o Cleanse wound with mild soap and water o May Shower, gently pat wound dry prior to applying new dressing. o May shower with protection. Wound #2 Right,Proximal,Lateral Lower Leg o Cleanse wound with mild soap and water o May Shower, gently pat wound dry prior to applying new dressing. o May shower with protection. Wound #3 Distal,Lateral Lower Leg o Cleanse wound with mild soap and water o May Shower, gently pat wound dry prior to applying new dressing. o May shower with protection. Wound #4 Right,Medial Calcaneus o Cleanse  wound with mild soap and water o May Shower, gently pat wound dry prior to applying new dressing. o May shower with protection. Anesthetic Wound #1 Right,Lateral Calcaneus o Topical Lidocaine 4% cream applied to wound bed prior to debridement Wound #2 Right,Proximal,Lateral Lower Leg o Topical Lidocaine 4% cream applied to wound bed prior to debridement [name], [name] ([id number]) Wound #3 Distal,Lateral Lower Leg o Topical Lidocaine 4% cream applied to wound bed prior to debridement Wound #4 Right,Medial Calcaneus o Topical Lidocaine 4% cream applied to wound bed prior to debridement Skin Barriers/Peri-Wound Care o Barrier cream Primary Wound Dressing Wound #1 Right,Lateral Calcaneus o Prisma Ag Wound #2 Right,Proximal,Lateral Lower Leg o Prisma Ag Wound #3 Distal,Lateral Lower Leg o Prisma Ag Wound #4 Right,Medial Calcaneus o Prisma Ag Secondary Dressing Wound #1 Right,Lateral Calcaneus o ABD and Kerlix/Conform Wound #2 Right,Proximal,Lateral Lower Leg o ABD and Kerlix/Conform Wound #3 Distal,Lateral Lower Leg o ABD and Kerlix/Conform Wound #4 Right,Medial Calcaneus o ABD and Kerlix/Conform Dressing Change Frequency Wound #1 Right,Lateral Calcaneus o Change dressing every other day. Wound #2 Right,Proximal,Lateral Lower Leg o Change dressing every other day. Wound #3 Distal,Lateral Lower Leg o Change dressing every other day. Wound #4 Right,Medial Calcaneus o Change dressing every other day. [name], [name] ([id number]) Follow-up Appointments Wound #1 Right,Lateral Calcaneus o Return Appointment in 1 week. Wound #2 Right,Proximal,Lateral Lower Leg o Return Appointment in 1 week. Wound #3 Distal,Lateral Lower Leg o Return Appointment in 1 week. Wound #4 Right,Medial Calcaneus o Return Appointment in 1 week. Off-Loading Wound #1 Right,Lateral Calcaneus o Heel suspension boot to: - SAGE BOOTS o Turn and  reposition every 2 hours Wound #2 Right,Proximal,Lateral Lower Leg o Heel suspension boot to: - SAGE BOOTS o Turn and reposition every 2 hours Wound #3 Distal,Lateral Lower Leg o Heel suspension boot to: - SAGE BOOTS o Turn and reposition every 2 hours Wound #4 Right,Medial Calcaneus o Heel suspension boot to: - SAGE BOOTS o Turn and reposition every 2 hours Additional Orders / Instructions Wound #1 Right,Lateral Calcaneus o Increase protein intake. - Ensure, Boost o Other: - VItamin C, A, MVI, Zinc Wound #2 Right,Proximal,Lateral Lower Leg o Increase protein intake. - Ensure, Boost o Other: - VItamin C, A, MVI, Zinc Wound #3 Distal,Lateral Lower Leg o Increase protein intake. - Ensure, Boost o Other: - VItamin C, A, MVI, Zinc Wound #4 Right,Medial Calcaneus o Increase protein intake. - Ensure, Boost o Other: - VItamin C, A, MVI, Zinc [name], Siboney L. ([id number]) Home Health Wound #1 Right,Lateral Calcaneus o Continue Home Health Visits - [facility] o Home Health Nurse may visit PRN to address patientos wound care needs. o FACE TO FACE ENCOUNTER: MEDICARE and MEDICAID PATIENTS: I certify that  this patient is under my care and that I had a face-to-face encounter that meets the physician face-to-face encounter requirements with this patient on this date. The encounter with the patient was in whole or in part for the following MEDICAL CONDITION: (primary reason for Home Healthcare) MEDICAL NECESSITY: I certify, that based on my findings, NURSING services are a medically necessary home health service. HOME BOUND STATUS: I certify that my clinical findings support that this patient is homebound (i.e., Due to illness or injury, pt requires aid of supportive devices such as crutches, cane, wheelchairs, walkers, the use of special transportation or the assistance of another person to leave their place of residence. There is a normal  inability to leave the home and doing so requires considerable and taxing effort. Other absences are for medical reasons / religious services and are infrequent or of short duration when for other reasons). o If current dressing causes regression in wound condition, may D/C ordered dressing product/s and apply Normal Saline Moist Dressing daily until next Wound Healing Center / Other MD appointment. Notify Wound Healing Center of regression in wound condition at [phone]. o Please direct any NON-WOUND related issues/requests for orders to patient's Primary Care Physician Wound #2 Right,Proximal,Lateral Lower Leg o Continue Home Health Visits - [facility] o Home Health Nurse may visit PRN to address patientos wound care needs. o FACE TO FACE ENCOUNTER: MEDICARE and MEDICAID PATIENTS: I certify that this patient is under my care and that I had a face-to-face encounter that meets the physician face-to-face encounter requirements with this patient on this date. The encounter with the patient was in whole or in part for the following MEDICAL CONDITION: (primary reason for Home Healthcare) MEDICAL NECESSITY: I certify, that based on my findings, NURSING services are a medically necessary home health service. HOME BOUND STATUS: I certify that my clinical findings support that this patient is homebound (i.e., Due to illness or injury, pt requires aid of supportive devices such as crutches, cane, wheelchairs, walkers, the use of special transportation or the assistance of another person to leave their place of residence. There is a normal inability to leave the home and doing so requires considerable and taxing effort. Other absences are for medical reasons / religious services and are infrequent or of short duration when for other reasons). o If current dressing causes regression in wound condition, may D/C ordered dressing product/s and apply Normal Saline Moist Dressing  daily until next Wound Healing Center / Other MD appointment. Notify Wound Healing Center of regression in wound condition at [phone]. o Please direct any NON-WOUND related issues/requests for orders to patient's Primary Care Physician Wound #3 Distal,Lateral Lower Leg o Continue Home Health Visits - [facility] o Home Health Nurse may visit PRN to address patientos wound care needs. o FACE TO FACE ENCOUNTER: MEDICARE and MEDICAID PATIENTS: I certify that this patient is under my care and that I had a face-to-face encounter that meets the physician face-to-face encounter requirements with this patient on this date. The encounter with the patient was in whole or in part for the following MEDICAL CONDITION: (primary reason for Home Healthcare) YANNA, LEAKS ([id number]) MEDICAL NECESSITY: I certify, that based on my findings, NURSING services are a medically necessary home health service. HOME BOUND STATUS: I certify that my clinical findings support that this patient is homebound (i.e., Due to illness or injury, pt requires aid of supportive devices such as crutches, cane, wheelchairs, walkers, the use of special  transportation or the assistance of another person to leave their place of residence. There is a normal inability to leave the home and doing so requires considerable and taxing effort. Other absences are for medical reasons / religious services and are infrequent or of short duration when for other reasons). o If current dressing causes regression in wound condition, may D/C ordered dressing product/s and apply Normal Saline Moist Dressing daily until next Wound Healing Center / Other MD appointment. Notify Wound Healing Center of regression in wound condition at [phone]. o Please direct any NON-WOUND related issues/requests for orders to patient's Primary Care Physician Wound #4 Right,Medial Calcaneus o Continue [facility] Health Visits - [facility] o Home Health Nurse may visit PRN to address patientos wound care needs. o FACE TO FACE ENCOUNTER: MEDICARE and MEDICAID PATIENTS: I certify that this patient is under my care and that I had a face-to-face encounter that meets the physician face-to-face encounter requirements with this patient on this date. The encounter with the patient was in whole or in part for the following MEDICAL CONDITION: (primary reason for Home Healthcare) MEDICAL NECESSITY: I certify, that based on my findings, NURSING services are a medically necessary home health service. HOME BOUND STATUS: I certify that my clinical findings support that this patient is homebound (i.e., Due to illness or injury, pt requires aid of supportive devices such as crutches, cane, wheelchairs, walkers, the use of special transportation or the assistance of another person to leave their place of residence. There is a normal inability to leave the home and doing so requires considerable and taxing effort. Other absences are for medical reasons / religious services and are infrequent or of short duration when for other reasons). o If current dressing causes regression in wound condition, may D/C ordered dressing product/s and apply Normal Saline Moist Dressing daily until next Wound Healing Center / Other MD appointment. Notify Wound Healing Center of regression in wound condition at [phone]. o Please direct any NON-WOUND related issues/requests for orders to patient's Primary Care Physician Electronic Signature(s) Signed: 03/24/2016 4:23:09 PM By: Elpidio Eric BSN, RN Signed: 03/25/2016 7:55:14 AM By: Baltazar Najjar MD Entered By: Elpidio Eric on 03/24/2016 09:50:51 Earnhart, [name] ([id number]) -------------------------------------------------------------------------------- Problem List Details [name] Date of Service: 03/24/2016 8:45 AM Patient Name: [name] Account Number: [id number] Medical  Record Treating RN: [name] [id number] Number: Other Clinician: Jul 05, 1921 (82 y.o. 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 Account Number: [id number] Medical Record Treating RN: [name] [id number] Number: Other Clinician: 09-12-1920 (83 y.o. Treating [name], [name] Date of Birth/Sex: Female) Physician/Extender: G Primary Care FULLER, SUSAN Physician: Referring Physician: Weeks in Treatment: 0 Subjective Chief Complaint Information obtained from Patient 03/24/16; patient is here for review wounds on the right lower extremity History of Present Illness (HPI) 03/24/16; this is an elderly 82 year old woman with advanced dementia who was hospitalized from 5/8 through 5/13.  At that point she had Proteus sepsis felt to be secondary to a UTI the. Acute kidney injury and delirium. Her son who is her primary caregiver says she came home from the hospital with wounds on her right lower extremity and apparently her right buttock as well. There is no mention on the hospital discharge summary of problems. She has been having [facility] and have been using silver  alginate based dressings. Apparently the area on the right buttock is healing and the son stated we did not need to become involved with that. In any case the patient has for wounds to on the right heel and 2 on the posterior lateral right calf, the latter of which is not a usual pressure area however that is the history. She is apparently eating and drinking fairly well. Takes ensure well. She does not have any pressure-relief surface at home. I have reviewed her lab work from the hospital. Her admission albumin was 3.4 on 5/8 Wound History Patient presents with 3 open wounds that have been present for approximately 3mos. Patient has been treating wounds in the following manner: silver alginate. Laboratory tests have not been performed in the last month. Patient reportedly has not tested positive for an antibiotic resistant organism. Patient reportedly has not tested positive for osteomyelitis. Patient reportedly has not had testing performed to evaluate circulation in the legs. Patient experiences the following problems associated with their wounds: infection. Patient History Allergies No known Allergies Family History Cancer - Siblings, Diabetes - Father, Mother, Heart Disease - Father, No family history of Hereditary Spherocytosis, Hypertension, Kidney Disease, Lung Disease, Seizures, Stroke, Thyroid Problems, Tuberculosis. ERMEL, VERNE ([id number]) Social History Never smoker, Marital Status - Widowed, Alcohol Use - Never, Drug Use - No History, Caffeine Use - Rarely. Medical History Eyes Patient has history of Cataracts Ear/Nose/Mouth/Throat Denies history of Chronic sinus problems/congestion, Middle ear problems Hematologic/Lymphatic Patient has history of Anemia Denies history of Hemophilia, Human Immunodeficiency Virus, Lymphedema, Sickle Cell Disease Respiratory Denies history of Aspiration, Asthma, Chronic Obstructive Pulmonary Disease (COPD), Pneumothorax, Sleep  Apnea, Tuberculosis Cardiovascular Patient has history of Hypertension Denies history of Angina, Arrhythmia, Congestive Heart Failure, Coronary Artery Disease, Hypotension, Myocardial Infarction, Peripheral Arterial Disease, Peripheral Venous Disease, Phlebitis, Vasculitis Gastrointestinal Denies history of Cirrhosis , Colitis, Crohn s, Hepatitis A, Hepatitis B, Hepatitis C Immunological Denies history of Lupus Erythematosus, Raynaud s, Scleroderma Integumentary (Skin) Patient has history of History of pressure wounds Musculoskeletal Patient has history of Osteoarthritis Neurologic Patient has history of Dementia Oncologic Denies history of Received Chemotherapy, Received Radiation Medical And Surgical History Notes Neurologic Alzeihmers Review of Systems (ROS) Constitutional Symptoms (General Health) The patient has no complaints or symptoms. Eyes Complains or has symptoms of Glasses / Contacts. Ear/Nose/Mouth/Throat The patient has no complaints or symptoms. Hematologic/Lymphatic The patient has no complaints or symptoms. Respiratory The patient has no complaints or symptoms. Cardiovascular The patient has no complaints or symptoms. Gastrointestinal Hammock, Shanoah L. ([id number]) The patient has no complaints or symptoms. Endocrine The patient has no complaints or symptoms. Genitourinary Complains or has symptoms of Incontinence/dribbling. Immunological The patient has no complaints or symptoms. Integumentary (Skin) Complains or has symptoms of Wounds, Breakdown. Musculoskeletal The patient has no complaints or symptoms. Neurologic The patient has no complaints or symptoms. Oncologic The patient has no complaints or symptoms. Psychiatric Complains or has symptoms of Anxiety. Objective Constitutional Patient is hypertensive.. Pulse regular and within target range for patient.Marland Kitchen Respirations regular, non-labored and within target range.. Temperature is  normal  and within the target range for the patient.. Patient's appearance is neat and clean. Appears in no acute distress. Well nourished and well developed.. Vitals Time Taken: 8:59 AM, Height: 68 in, Source: Stated, Weight: 118 lbs, Source: Stated, BMI: 17.9, Temperature: 97.7 F, Pulse: 77 bpm, Respiratory Rate: 18 breaths/min, Blood Pressure: 154/71 mmHg. Eyes Conjunctivae clear. No discharge.. Ears, Nose, Mouth, and Throat Edentulous no oral lesions. Neck Neck supple and symmetrical. No masses or crepitus. Thyroid within normal limits, without enlargement, tenderness or masses.Marland Kitchen Respiratory Respiratory effort is easy and symmetric bilaterally. Rate is normal at rest and on room air.. Bilateral breath sounds are clear and equal in all lobes with no wheezes, rales or rhonchi.. Cardiovascular Heart rhythm and rate regular, without murmur or gallop.. Femoral arteries without bruits and pulses strong.Marland Kitchen Zelada, Unika L. ([id number]) Pedal pulses absent bilaterally.. Her lower extremities look quite stable no major edema. No overt stasis physiology. Gastrointestinal (GI) Abdomen is soft and non-distended without masses or tenderness. Bowel sounds active in all quadrants.. No liver or spleen enlargement or tenderness.. Genitourinary (GU) Bladder without fullness, masses or tenderness.. Lymphatic No lymphadenopathy or glandular swelling noted in neck.. Psychiatric Severe Alzheimer's disease. General Notes: Wound exam; On the right heel there are 2 areas here. Lateral aspect a stage III wound with some depth. On the Achilles area stage II wound. The area on the lateral aspect of the heel was debrided of surface slough On the posterior lateral aspect of the right calf to deep open areas with exposed lateral tendon at the base. Although these were also debrided however there is no overt evidence of infection. Not a usual site for pressure ulcers. Integumentary (Hair, Skin) Wound #1 status is  Open. Original cause of wound was Pressure Injury. The wound is located on the Right,Lateral Calcaneus. The wound measures 2.5cm length x 3.5cm width x 0.8cm depth; 6.872cm^2 area and 5.498cm^3 volume. There is tendon exposed. There is no tunneling or undermining noted. There is a large amount of serosanguineous drainage noted. The wound margin is distinct with the outline attached to the wound base. There is small (1-33%) pink, pale granulation within the wound bed. There is a large (67- 100%) amount of necrotic tissue within the wound bed including Adherent Slough. The periwound skin appearance exhibited: Localized Edema, Moist. The periwound skin appearance did not exhibit: Callus, Crepitus, Excoriation, Fluctuance, Friable, Induration, Rash, Scarring, Dry/Scaly, Maceration, Atrophie Blanche, Cyanosis, Ecchymosis, Hemosiderin Staining, Mottled, Pallor, Rubor, Erythema. Periwound temperature was noted as No Abnormality. The periwound has tenderness on palpation. Wound #2 status is Open. Original cause of wound was Pressure Injury. The wound is located on the Right,Proximal,Lateral Lower Leg. The wound measures 2cm length x 1cm width x 0.8cm depth; 1.571cm^2 area and 1.257cm^3 volume. There is tendon exposed. There is no tunneling or undermining noted. There is a medium amount of serosanguineous drainage noted. The wound margin is distinct with the outline attached to the wound base. There is small (1-33%) pale granulation within the wound bed. There is a large (67-100%) amount of necrotic tissue within the wound bed including Adherent Slough. The periwound skin appearance exhibited: Moist. The periwound skin appearance did not exhibit: Callus, Crepitus, Excoriation, Fluctuance, Friable, Induration, Localized Edema, Rash, Scarring, Dry/Scaly, Maceration, Atrophie Blanche, Cyanosis, Ecchymosis, Hemosiderin Staining, Mottled, Pallor, Rubor, Erythema. Periwound temperature was noted as No  Abnormality. The periwound has tenderness on palpation. Wound #3 status is Open. Original cause of wound was Pressure Injury. The wound is located on the  Distal,Lateral Lower Leg. The wound measures 2.5cm length x 2cm width x 1cm depth; 3.927cm^2 area and 3.927cm^3 volume. The wound is limited to skin breakdown. There is no tunneling or undermining noted. There is a medium amount of serosanguineous drainage noted. The wound margin is distinct with the outline attached to the wound base. There is small (1-33%) pink, pale granulation within the wound bed. Fugitt, Greg L. ([id number]) There is a large (67-100%) amount of necrotic tissue within the wound bed including Adherent Slough. The periwound skin appearance exhibited: Moist. The periwound skin appearance did not exhibit: Callus, Crepitus, Excoriation, Fluctuance, Friable, Induration, Localized Edema, Rash, Scarring, Dry/Scaly, Maceration, Atrophie Blanche, Cyanosis, Ecchymosis, Hemosiderin Staining, Mottled, Pallor, Rubor, Erythema. Periwound temperature was noted as No Abnormality. The periwound has tenderness on palpation. Wound #4 status is Open. Original cause of wound was Pressure Injury. The wound is located on the Right,Medial Calcaneus. The wound measures 2cm length x 1cm width x 0.1cm depth; 1.571cm^2 area and 0.157cm^3 volume. The wound is limited to skin breakdown. There is no tunneling or undermining noted. There is a medium amount of serosanguineous drainage noted. The wound margin is distinct with the outline attached to the wound base. There is no granulation within the wound bed. There is a large (67- 100%) amount of necrotic tissue within the wound bed including Adherent Slough. The periwound skin appearance exhibited: Moist. The periwound skin appearance did not exhibit: Callus, Crepitus, Excoriation, Fluctuance, Friable, Induration, Localized Edema, Rash, Scarring, Dry/Scaly, Maceration, Atrophie Blanche, Cyanosis,  Ecchymosis, Hemosiderin Staining, Mottled, Pallor, Rubor, Erythema. Periwound temperature was noted as No Abnormality. The periwound has tenderness on palpation. 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 There was a Skin/Subcutaneous Tissue Debridement ([id number]) debridement with total area of 8.75 sq cm performed by [name], MD. with the following instrument(s): Curette to remove Non-Viable tissue/material including Exudate, Fibrin/Slough, and Subcutaneous after achieving pain control using Lidocaine 4% Topical Solution. A time out was conducted at 09:40, prior to the start of the procedure. A Minimum amount of bleeding was controlled with Pressure. The procedure was tolerated well with a pain level of 0 throughout and a pain level of 0 following the procedure. Post Debridement Measurements: 2.5cm length x 3.5cm width x 0.8cm depth; 5.498cm^3 volume. Post debridement Stage noted as Category/Stage IV. Character of Wound/Ulcer Post Debridement requires further debridement. Severity of Tissue Post Debridement is: Limited to breakdown of skin. Post procedure Diagnosis Wound #1: Same as Pre-Procedure Mirabal, Sueellen L. ([id number]) Wound #3 Wound #3 is a Pressure Ulcer located on the Distal,Lateral Lower Leg . There was a Skin/Subcutaneous Tissue Debridement ([id number]) debridement with total area of 5 sq cm performed by [name], MD. with the following instrument(s): Curette to remove Non-Viable tissue/material including Exudate, Fibrin/Slough, and Subcutaneous after achieving pain control using Lidocaine 4% Topical Solution. A time out was conducted at 09:40, prior to the  start of the procedure. A Minimum amount of bleeding was controlled with Pressure. The procedure was tolerated well with a pain level of 0 throughout and a pain level of 0 following the procedure. Post Debridement Measurements: 2.5cm length x 2cm width x 1cm depth; 3.927cm^3 volume. Post debridement Stage noted as Category/Stage III. Character of Wound/Ulcer  Post Debridement requires further debridement. Severity of Tissue Post Debridement is: Limited to breakdown of skin. Post procedure Diagnosis Wound #3: Same as Pre-Procedure Plan Wound Cleansing: Wound #1 Right,Lateral Calcaneus: Cleanse wound with mild soap and water May Shower, gently pat wound dry prior to applying new dressing. May shower with protection. Wound #2 Right,Proximal,Lateral Lower Leg: Cleanse wound with mild soap and water May Shower, gently pat wound dry prior to applying new dressing. May shower with protection. Wound #3 Distal,Lateral Lower Leg: Cleanse wound with mild soap and water May Shower, gently pat wound dry prior to applying new dressing. May shower with protection. Wound #4 Right,Medial Calcaneus: Cleanse wound with mild soap and water May Shower, gently pat wound dry prior to applying new dressing. May shower with protection. Anesthetic: Wound #1 Right,Lateral Calcaneus: Topical Lidocaine 4% cream applied to wound bed prior to debridement Wound #2 Right,Proximal,Lateral Lower Leg: Topical Lidocaine 4% cream applied to wound bed prior to debridement Wound #3 Distal,Lateral Lower Leg: Topical Lidocaine 4% cream applied to wound bed prior to debridement Wound #4 Right,Medial Calcaneus: Topical Lidocaine 4% cream applied to wound bed prior to debridement Skin Barriers/Peri-Wound Care: Barrier cream TESSICA, CUPO ([id number]) Primary Wound Dressing: Wound #1 Right,Lateral Calcaneus: Prisma Ag Wound #2 Right,Proximal,Lateral Lower Leg: Prisma Ag Wound #3 Distal,Lateral Lower Leg: Prisma  Ag Wound #4 Right,Medial Calcaneus: Prisma Ag Secondary Dressing: Wound #1 Right,Lateral Calcaneus: ABD and Kerlix/Conform Wound #2 Right,Proximal,Lateral Lower Leg: ABD and Kerlix/Conform Wound #3 Distal,Lateral Lower Leg: ABD and Kerlix/Conform Wound #4 Right,Medial Calcaneus: ABD and Kerlix/Conform Dressing Change Frequency: Wound #1 Right,Lateral Calcaneus: Change dressing every other day. Wound #2 Right,Proximal,Lateral Lower Leg: Change dressing every other day. Wound #3 Distal,Lateral Lower Leg: Change dressing every other day. Wound #4 Right,Medial Calcaneus: Change dressing every other day. Follow-up Appointments: Wound #1 Right,Lateral Calcaneus: Return Appointment in 1 week. Wound #2 Right,Proximal,Lateral Lower Leg: Return Appointment in 1 week. Wound #3 Distal,Lateral Lower Leg: Return Appointment in 1 week. Wound #4 Right,Medial Calcaneus: Return Appointment in 1 week. Off-Loading: Wound #1 Right,Lateral Calcaneus: Heel suspension boot to: - SAGE BOOTS Turn and reposition every 2 hours Wound #2 Right,Proximal,Lateral Lower Leg: Heel suspension boot to: - SAGE BOOTS Turn and reposition every 2 hours Wound #3 Distal,Lateral Lower Leg: Heel suspension boot to: - SAGE BOOTS Turn and reposition every 2 hours Wound #4 Right,Medial Calcaneus: Heel suspension boot to: - SAGE BOOTS Turn and reposition every 2 hours Additional Orders / Instructions: Wound #1 Right,Lateral Calcaneus: Riess, Ely L. ([id number]) Increase protein intake. - Ensure, Boost Other: - VItamin C, A, MVI, Zinc Wound #2 Right,Proximal,Lateral Lower Leg: Increase protein intake. - Ensure, Boost Other: - VItamin C, A, MVI, Zinc Wound #3 Distal,Lateral Lower Leg: Increase protein intake. - Ensure, Boost Other: - VItamin C, A, MVI, Zinc Wound #4 Right,Medial Calcaneus: Increase protein intake. - Ensure, Boost Other: - VItamin C, A, MVI, Zinc Home Health: Wound #1 Right,Lateral  Calcaneus: Continue Home Health Visits - [facility] Home Health Nurse may visit PRN to address patient s wound care needs. FACE TO FACE ENCOUNTER: MEDICARE and MEDICAID PATIENTS: I certify that this patient is under my care and that I had a face-to-face encounter that meets the physician face-to-face encounter requirements with this patient on this date. The encounter with the patient was in whole or in part for the following MEDICAL CONDITION: (primary reason for Home Healthcare) MEDICAL NECESSITY: I certify, that based on my findings, NURSING services are a medically necessary home health  service. HOME BOUND STATUS: I certify that my clinical findings support that this patient is homebound (i.e., Due to illness or injury, pt requires aid of supportive devices such as crutches, cane, wheelchairs, walkers, the use of special transportation or the assistance of another person to leave their place of residence. There is a normal inability to leave the home and doing so requires considerable and taxing effort. Other absences are for medical reasons / religious services and are infrequent or of short duration when for other reasons). If current dressing causes regression in wound condition, may D/C ordered dressing product/s and apply Normal Saline Moist Dressing daily until next Wound Healing Center / Other MD appointment. Notify Wound Healing Center of regression in wound condition at [phone]. Please direct any NON-WOUND related issues/requests for orders to patient's Primary Care Physician Wound #2 Right,Proximal,Lateral Lower Leg: Continue Home Health Visits - [facility] Home Health Nurse may visit PRN to address patient s wound care needs. FACE TO FACE ENCOUNTER: MEDICARE and MEDICAID PATIENTS: I certify that this patient is under my care and that I had a face-to-face encounter that meets the physician face-to-face encounter requirements with this patient on this date.  The encounter with the patient was in whole or in part for the following MEDICAL CONDITION: (primary reason for Home Healthcare) MEDICAL NECESSITY: I certify, that based on my findings, NURSING services are a medically necessary home health service. HOME BOUND STATUS: I certify that my clinical findings support that this patient is homebound (i.e., Due to illness or injury, pt requires aid of supportive devices such as crutches, cane, wheelchairs, walkers, the use of special transportation or the assistance of another person to leave their place of residence. There is a normal inability to leave the home and doing so requires considerable and taxing effort. Other absences are for medical reasons / religious services and are infrequent or of short duration when for other reasons). If current dressing causes regression in wound condition, may D/C ordered dressing product/s and apply Normal Saline Moist Dressing daily until next Wound Healing Center / Other MD appointment. Notify Wound Healing Center of regression in wound condition at [phone]. Please direct any NON-WOUND related issues/requests for orders to patient's Primary Care Physician Wound #3 Distal,Lateral Lower Leg: Continue Home Health Visits - [facility] Home Health Nurse may visit PRN to address patient s wound care needs. FACE TO FACE ENCOUNTER: MEDICARE and MEDICAID PATIENTS: I certify that this patient is under my care and that I had a face-to-face encounter that meets the physician face-to-face encounter SHAYLA, HEMING ([id number]) requirements with this patient on this date. The encounter with the patient was in whole or in part for the following MEDICAL CONDITION: (primary reason for Home Healthcare) MEDICAL NECESSITY: I certify, that based on my findings, NURSING services are a medically necessary home health service. HOME BOUND STATUS: I certify that my clinical findings support that this patient is  homebound (i.e., Due to illness or injury, pt requires aid of supportive devices such as crutches, cane, wheelchairs, walkers, the use of special transportation or the assistance of another person to leave their place of residence. There is a normal inability to leave the home and doing so requires considerable and taxing effort. Other absences are for medical reasons / religious services and are infrequent or of short duration when for other reasons). If current dressing causes regression in wound condition, may D/C ordered dressing product/s and apply Normal Saline Moist Dressing daily until next  Wound Healing Center / Other MD appointment. Notify Wound Healing Center of regression in wound condition at [phone]. Please direct any NON-WOUND related issues/requests for orders to patient's Primary Care Physician Wound #4 Right,Medial Calcaneus: Continue Home Health Visits - [facility] Home Health Nurse may visit PRN to address patient s wound care needs. FACE TO FACE ENCOUNTER: MEDICARE and MEDICAID PATIENTS: I certify that this patient is under my care and that I had a face-to-face encounter that meets the physician face-to-face encounter requirements with this patient on this date. The encounter with the patient was in whole or in part for the following MEDICAL CONDITION: (primary reason for Home Healthcare) MEDICAL NECESSITY: I certify, that based on my findings, NURSING services are a medically necessary home health service. HOME BOUND STATUS: I certify that my clinical findings support that this patient is homebound (i.e., Due to illness or injury, pt requires aid of supportive devices such as crutches, cane, wheelchairs, walkers, the use of special transportation or the assistance of another person to leave their place of residence. There is a normal inability to leave the home and doing so requires considerable and taxing effort. Other absences are for medical reasons /  religious services and are infrequent or of short duration when for other reasons). If current dressing causes regression in wound condition, may D/C ordered dressing product/s and apply Normal Saline Moist Dressing daily until next Wound Healing Center / Other MD appointment. Notify Wound Healing Center of regression in wound condition at [phone]. Please direct any NON-WOUND related issues/requests for orders to patient's Primary Care Physician #1 to both the wounds on the right heel we applied silver collagen under Kerlix Coban wrap. This can be changed 3 times weekly with the assistance of home health. #2 the deep areas on the right lateral calf are really of uncertain etiology in my mind. She does not seem to have significant PAD, venous insufficiency or infection. These are not usual pressure areas. We did dress these with silver collagen the same as they heal. #3 the patient is not a diabetic, however even at this early stage I am thinking about an advanced treatment option. Electronic Signature(s) Signed: 03/25/2016 7:55:14 AM By: Baltazar Najjar MD Entered By: Baltazar Najjar on 03/24/2016 11:02:46 Dearing, [name] ([id number]) CRYSTLE, CARELLI ([id number]) -------------------------------------------------------------------------------- ROS/PFSH Details [name] Date of Service: 03/24/2016 8:45 AM Patient Name: [name] Account Number: [id number] Medical Record Treating RN: Clover Mealy RN, BSN, Campbell Sink [id number] Number: Other Clinician: 02-13-1921 (82 y.o. Treating [name], [name] Date of Birth/Sex: Female) Physician/Extender: G Primary Care FULLER, SUSAN Physician: Referring Physician: Weeks in Treatment: 0 Wound History Do you currently have one or more open woundso Yes How many open wounds do you currently haveo 3 Approximately how long have you had your woundso 3mos How have you been treating your wound(s) until nowo silver alginate Has your wound(s) ever  healed and then re-openedo No Have you had any lab work done in the past montho No Have you tested positive for an antibiotic resistant organism (MRSA, VRE)o No Have you tested positive for osteomyelitis (bone infection)o No Have you had any tests for circulation on your legso No Have you had other problems associated with your woundso Infection Eyes Complaints and Symptoms: Positive for: Glasses / Contacts Medical History: Positive for: Cataracts Genitourinary Complaints and Symptoms: Positive for: Incontinence/dribbling Integumentary (Skin) Complaints and Symptoms: Positive for: Wounds; Breakdown Medical History: Positive for: History of pressure wounds Psychiatric Complaints and Symptoms: Positive for: Anxiety [place],  Allye LMarland Kitchen ([id number]) Constitutional Symptoms (General Health) Complaints and Symptoms: No Complaints or Symptoms Ear/Nose/Mouth/Throat Complaints and Symptoms: No Complaints or Symptoms Medical History: Negative for: Chronic sinus problems/congestion; Middle ear problems Hematologic/Lymphatic Complaints and Symptoms: No Complaints or Symptoms Medical History: Positive for: Anemia Negative for: Hemophilia; Human Immunodeficiency Virus; Lymphedema; Sickle Cell Disease Respiratory Complaints and Symptoms: No Complaints or Symptoms Medical History: Negative for: Aspiration; Asthma; Chronic Obstructive Pulmonary Disease (COPD); Pneumothorax; Sleep Apnea; Tuberculosis Cardiovascular Complaints and Symptoms: No Complaints or Symptoms Medical History: Positive for: Hypertension Negative for: Angina; Arrhythmia; Congestive Heart Failure; Coronary Artery Disease; Hypotension; Myocardial Infarction; Peripheral Arterial Disease; Peripheral Venous Disease; Phlebitis; Vasculitis Gastrointestinal Complaints and Symptoms: No Complaints or Symptoms Medical History: Negative for: Cirrhosis ; Colitis; Crohnos; Hepatitis A; Hepatitis B; Hepatitis  C Endocrine Hessler, Shelbee L. ([id number]) Complaints and Symptoms: No Complaints or Symptoms Immunological Complaints and Symptoms: No Complaints or Symptoms Medical History: Negative for: Lupus Erythematosus; Raynaudos; Scleroderma Musculoskeletal Complaints and Symptoms: No Complaints or Symptoms Medical History: Positive for: Osteoarthritis Neurologic Complaints and Symptoms: No Complaints or Symptoms Medical History: Positive for: Dementia Past Medical History Notes: [place] Oncologic Complaints and Symptoms: No Complaints or Symptoms Medical History: Negative for: Received Chemotherapy; Received Radiation HBO Extended History Items Eyes: Cataracts Immunizations Pneumococcal Vaccine: Received Pneumococcal Vaccination: No Family and Social History Cancer: Yes - Siblings; Diabetes: Yes - Father, Mother; Heart Disease: Yes - Father; Hereditary Spherocytosis: No; Hypertension: No; Kidney Disease: No; Lung Disease: No; Seizures: No; Stroke: No; Thyroid Problems: No; Tuberculosis: No; Never smoker; Marital Status - Widowed; Alcohol Use: Never; Drug Use: No History; Caffeine Use: Rarely; Financial Concerns: No; Food, Clothing or Shelter Needs: No; NAKISHA, CHAI L. ([id number]) Support System Lacking: No; Transportation Concerns: No; Do not resuscitate: Yes (Not Provided); Medical Power of Attorney: Yes - Trude Mcburney (Not Provided) Electronic Signature(s) Signed: 03/24/2016 4:23:09 PM By: Elpidio Eric BSN, RN Signed: 03/25/2016 7:55:14 AM By: Baltazar Najjar MD Entered By: Elpidio Eric on 03/24/2016 09:28:40 [place], [name] ([id number]) -------------------------------------------------------------------------------- SuperBill Details [name] Date of Service: 03/24/2016 Patient Name: [name] Account Number: [id number] Medical Record Treating RN: [name] [id number] Number: Other Clinician: October 11, 1920 (82 y.o. Treating [name], [name] Date of  Birth/Sex: Female) Physician/Extender: G [facility] Weeks in Treatment: 0 FULLER, SUSAN Physician: Referring Physician: Diagnosis Coding ICD-10 Codes Code Description [phone] Pressure ulcer of right heel, stage 2 L89.613 Pressure ulcer of right heel, stage 3 L97.213 Non-pressure chronic ulcer of right calf with necrosis of muscle G30.9 Alzheimer's disease, unspecified Facility Procedures CPT4 Code: [id number] Description: 99213 - WOUND CARE VISIT-LEV 3 EST PT Modifier: Quantity: 1 CPT4 Code: [id number] Description: 11042 - DEB SUBQ TISSUE 20 SQ CM/< ICD-10 Description Diagnosis L89.613 Pressure ulcer of right heel, stage 3 L89.612 Pressure ulcer of right heel, stage 2 Modifier: Quantity: 1 Physician Procedures CPT4 Code: [id number] Description: 99204 - WC PHYS LEVEL 4 - NEW PT ICD-10 Description Diagnosis L89.613 Pressure ulcer of right heel, stage 3 Modifier: Quantity: 1 CPT4 Code: [id number] Mangiapane, ELIZABET Description: 11042 - WC PHYS SUBQ TISS 20 SQ CM ICD-10 Description Diagnosis L89.613 Pressure ulcer of right heel, stage 3 L89.612 Pressure ulcer of right heel, stage 2 H L. ([id number]) Modifier: Quantity: 1 Electronic Signature(s) Signed: 03/25/2016 7:55:14 AM By: Baltazar Najjar MD Entered By: Baltazar Najjar on 03/24/2016 11:04:12

## 2016-03-31 ENCOUNTER — Ambulatory Visit: Payer: Medicare Other | Admitting: Internal Medicine

## 2016-04-01 ENCOUNTER — Encounter: Payer: Medicare Other | Admitting: Internal Medicine

## 2016-04-01 DIAGNOSIS — L89612 Pressure ulcer of right heel, stage 2: Secondary | ICD-10-CM | POA: Diagnosis not present

## 2016-04-02 ENCOUNTER — Other Ambulatory Visit
Admission: RE | Admit: 2016-04-02 | Discharge: 2016-04-02 | Disposition: A | Discharge status: Home / Self Care | Payer: Medicare Other | Source: Ambulatory Visit | Attending: Internal Medicine | Admitting: Internal Medicine

## 2016-04-02 DIAGNOSIS — L089 Local infection of the skin and subcutaneous tissue, unspecified: Secondary | ICD-10-CM | POA: Diagnosis present

## 2016-04-02 NOTE — Progress Notes (Addendum)
[name], [name] ([id number]) Visit Report for 04/01/2016 Arrival Information Details ASHLEA, DUSING Date of Service: 04/01/2016 3:45 PM Patient Name: [name] Account Number: [id number] Medical Record Treating RN: Clover Mealy RN, BSN, [place] Sink [id number] Number: Other Clinician: 09-07-1920 (82 y.o. Treating [name], [name] Date of Birth/Sex: Female) Physician/Extender: G Primary Care FULLER, SUSAN Physician: Referring Physician: Meliton Rattan in Treatment: 1 Visit Information History Since Last Visit All ordered tests and consults were completed: No Patient Arrived: Wheel Chair Added or deleted any medications: No Arrival Time: 15:42 Any new allergies or adverse reactions: No Accompanied By: son Had a fall or experienced change in No Transfer Assistance: EasyPivot activities of daily living that may affect Patient Lift risk of falls: Patient Identification Verified: Yes Signs or symptoms of abuse/neglect since last No Secondary Verification Process Yes visito Completed: Hospitalized since last visit: No Patient Requires Transmission- No Has Dressing in Place as Prescribed: Yes Based Precautions: Pain Present Now: No Patient Has Alerts: No Electronic Signature(s) Signed: 04/01/2016 5:39:43 PM By: Elpidio Eric BSN, RN Entered By: Elpidio Eric on 04/01/2016 15:42:38 [name], [name] ([id number]) -------------------------------------------------------------------------------- Clinic Level of Care Assessment Details [name] Date of Service: 04/01/2016 3:45 PM Patient Name: [name] Account Number: [id number] Medical Record Treating RN: Clover Mealy RN, BSN, [place] Sink [id number] Number: Other Clinician: [birth year] (82 y.o. Treating [name], [name] Date of Birth/Sex: Female) Physician/Extender: G Primary Care FULLER, SUSAN Physician: Referring Physician: Meliton Rattan in Treatment: 1 Clinic Level of Care Assessment Items TOOL 4 Quantity Score []  - Use  when only an EandM is performed on FOLLOW-UP visit 0 ASSESSMENTS - Nursing Assessment / Reassessment X - Reassessment of Co-morbidities (includes updates in patient status) 1 10 X - Reassessment of Adherence to Treatment Plan 1 5 ASSESSMENTS - Wound and Skin Assessment / Reassessment []  - Simple Wound Assessment / Reassessment - one wound 0 X - Complex Wound Assessment / Reassessment - multiple wounds 3 5 []  - Dermatologic / Skin Assessment (not related to wound area) 0 ASSESSMENTS - Focused Assessment []  - Circumferential Edema Measurements - multi extremities 0 []  - Nutritional Assessment / Counseling / Intervention 0 X - Lower Extremity Assessment (monofilament, tuning fork, pulses) 1 5 []  - Peripheral Arterial Disease Assessment (using hand held doppler) 0 ASSESSMENTS - Ostomy and/or Continence Assessment and Care []  - Incontinence Assessment and Management 0 []  - Ostomy Care Assessment and Management (repouching, etc.) 0 PROCESS - Coordination of Care X - Simple Patient / Family Education for ongoing care 1 15 []  - Complex (extensive) Patient / Family Education for ongoing care 0 X - Staff obtains Consents, Records, Test Results / Process Orders 1 10 [name], [name] ([id number]) X - Staff telephones HHA, Nursing Homes / Clarify orders / etc 1 10 []  - Routine Transfer to another Facility (non-emergent condition) 0 []  - Routine Hospital Admission (non-emergent condition) 0 []  - New Admissions / Manufacturing engineer / Ordering NPWT, Apligraf, etc. 0 []  - Emergency Hospital Admission (emergent condition) 0 []  - Simple Discharge Coordination 0 []  - Complex (extensive) Discharge Coordination 0 PROCESS - Special Needs []  - Pediatric / Minor Patient Management 0 []  - Isolation Patient Management 0 []  - Hearing / Language / Visual special needs 0 []  - Assessment of Community assistance (transportation, D/C planning, etc.) 0 []  - Additional assistance / Altered mentation 0 []  -  Support Surface(s) Assessment (bed, cushion, seat, etc.) 0 INTERVENTIONS - Wound Cleansing / Measurement []  - Simple Wound Cleansing - one wound 0 X - Complex  Wound Cleansing - multiple wounds 3 5 X - Wound Imaging (photographs - any number of wounds) 1 5 []  - Wound Tracing (instead of photographs) 0 []  - Simple Wound Measurement - one wound 0 X - Complex Wound Measurement - multiple wounds 3 5 INTERVENTIONS - Wound Dressings X - Small Wound Dressing one or multiple wounds 3 10 []  - Medium Wound Dressing one or multiple wounds 0 []  - Large Wound Dressing one or multiple wounds 0 []  - Application of Medications - topical 0 [name], [name] ([id number]) []  - Application of Medications - injection 0 INTERVENTIONS - Miscellaneous []  - External ear exam 0 X - Specimen Collection (cultures, biopsies, blood, body fluids, etc.) 1 5 []  - Specimen(s) / Culture(s) sent or taken to Lab for analysis 0 []  - Patient Transfer (multiple staff / Nurse, adult / Similar devices) 0 []  - Simple Staple / Suture removal (25 or less) 0 []  - Complex Staple / Suture removal (26 or more) 0 []  - Hypo / Hyperglycemic Management (close monitor of Blood Glucose) 0 X - Ankle / Brachial Index (ABI) - do not check if billed separately 1 15 X - Vital Signs 1 5 Has the patient been seen at the hospital within the last three years: Yes Total Score: 160 Level Of Care: New/Established - Level 5 Electronic Signature(s) Signed: 04/01/2016 5:39:43 PM By: Elpidio Eric BSN, RN Entered By: Elpidio Eric on 04/01/2016 16:19:45 [name], [name] ([id number]) -------------------------------------------------------------------------------- Encounter Discharge Information Details [name] Date of Service: 04/01/2016 3:45 PM Patient Name: [name] Account Number: [id number] Medical Record Treating RN: Clover Mealy RN, BSN, [place] Sink [id number] Number: Other Clinician: 05/19/1921 (82 y.o. Treating [name], [name] Date of  Birth/Sex: Female) Physician/Extender: G Primary Care FULLER, SUSAN Physician: Referring Physician: Meliton Rattan in Treatment: 1 Encounter Discharge Information Items Discharge Pain Level: 0 Discharge Condition: Stable Ambulatory Status: Wheelchair Discharge Destination: Home Transportation: Private Auto Accompanied By: son Schedule Follow-up Appointment: No Medication Reconciliation completed and provided to Patient/Care No Lisbet Busker: Provided on Clinical Summary of Care: 04/01/2016 Form Type Recipient Paper Patient ES Electronic Signature(s) Signed: 04/01/2016 4:52:58 PM By: Elpidio Eric BSN, RN Previous Signature: 04/01/2016 4:33:18 PM Version By: Gwenlyn Perking Entered By: Elpidio Eric on 04/01/2016 16:52:57 Bruno, [name] -------------------------------------------------------------------------------- Lower Extremity Assessment Details [name] Date of Service: 04/01/2016 3:45 PM Patient Name: [name] Account Number: [id number] Medical Record Treating RN: Clover Mealy RN, BSN, [place] Sink [id number] Number: Other Clinician: Sep 11, 1920 (82 y.o. Treating [name], [name] Date of Birth/Sex: Female) Physician/Extender: G Primary Care FULLER, SUSAN Physician: Referring Physician: [name] Weeks in Treatment: 1 Vascular Assessment Pulses: Posterior Tibial Dorsalis Pedis Palpable: [Right:Yes] Extremity colors, hair growth, and conditions: Extremity Color: [Right:Mottled] Hair Growth on Extremity: [Right:No] Temperature of Extremity: [Right:Warm] Capillary Refill: [Right:< 3 seconds] Blood Pressure: Brachial: [Right:141] Dorsalis Pedis: [Left:Dorsalis Pedis: 138] Ankle: Posterior Tibial: [Left:Posterior Tibial:] [Right:0.98] Toe Nail Assessment Left: Right: Thick: Yes Discolored: Yes Deformed: Yes Improper Length and Hygiene: Yes Electronic Signature(s) Signed: 04/01/2016 5:39:43 PM By: Elpidio Eric BSN, RN Entered By: Elpidio Eric on  04/01/2016 16:24:43 [name], [name] ([id number]) -------------------------------------------------------------------------------- Multi Wound Chart Details [name] Date of Service: 04/01/2016 3:45 PM Patient Name: [name] Account Number: [id number] Medical Record Treating RN: Clover Mealy RN, BSN, [place] Sink [id number] Number: Other Clinician: 1920/10/29 (82 y.o. Treating [name], [name] Date of Birth/Sex: Female) Physician/Extender: G Primary Care FULLER, SUSAN Physician: Referring Physician: [name] Weeks in Treatment: 1 Vital Signs Height(in): 68 Pulse(bpm): 91 Weight(lbs): 118 Blood Pressure 141/69 (mmHg):  Body Mass Index(BMI): 18 Temperature(F): 98.2 Respiratory Rate 18 (breaths/min): Photos: [1:No Photos] [2:No Photos] [3:No Photos] Wound Location: [1:Right, Lateral Calcaneus Right Lower Leg - Lateral,] [2:Proximal] [3:Lower Leg - Lateral, Distal] Wounding Event: [1:Pressure Injury] [2:Pressure Injury] [3:Pressure Injury] Primary Etiology: [1:Pressure Ulcer] [2:Pressure Ulcer] [3:Pressure Ulcer] Comorbid History: [1:Cataracts, Anemia, Hypertension, History of Hypertension, History of pressure wounds, Osteoarthritis, Dementia Osteoarthritis, Dementia] [2:Cataracts, Anemia, pressure wounds,] [3:Cataracts, Anemia, Hypertension, History of pressure  wounds, Osteoarthritis, Dementia] Date Acquired: [1:01/02/2016] [2:01/02/2016] [3:01/02/2016] Weeks of Treatment: [1:1] [2:1] [3:1] Wound Status: [1:Converted] [2:Open] [3:Open] Measurements L x W x D 0x0x0 [2:1.7x0.8x0.8] [3:2.5x1x0.9] (cm) Area (cm) : [1:0] [2:1.068] [3:1.963] Volume (cm) : [1:0] [2:0.855] [3:1.767] % Reduction in Area: [1:100.00%] [2:32.00%] [3:50.00%] % Reduction in Volume: 100.00% [2:32.00%] [3:55.00%] Classification: [1:Category/Stage IV] [2:Category/Stage IV] [3:Category/Stage III] Exudate Amount: [1:Large] [2:Medium] [3:Medium] Exudate Type: [1:Serosanguineous] [2:Serosanguineous]  [3:Serosanguineous] Exudate Color: [1:red, brown] [2:red, brown] [3:red, brown] Foul Odor After [1:Yes] [2:Yes] [3:Yes] Cleansing: Odor Anticipated Due to No [2:No] [3:No] Product Use: Friebel, Kalyani L. ([id number]) Wound Margin: Distinct, outline attached Distinct, outline attached Distinct, outline attached Granulation Amount: Small (1-33%) Medium (34-66%) Small (1-33%) Granulation Quality: Pink, Pale Red Pink, Pale Necrotic Amount: Large (67-100%) Small (1-33%) Medium (34-66%) Exposed Structures: Tendon: Yes Tendon: Yes Fascia: No Fascia: No Fascia: No Fat: No Fat: No Fat: No Tendon: No Muscle: No Muscle: No Muscle: No Joint: No Joint: No Joint: No Bone: No Bone: No Bone: No Limited to Skin Breakdown Epithelialization: None None None Periwound Skin Texture: Edema: Yes Edema: No Edema: No Excoriation: No Excoriation: No Excoriation: No Induration: No Induration: No Induration: No Callus: No Callus: No Callus: No Crepitus: No Crepitus: No Crepitus: No Fluctuance: No Fluctuance: No Fluctuance: No Friable: No Friable: No Friable: No Rash: No Rash: No Rash: No Scarring: No Scarring: No Scarring: No Periwound Skin Moist: Yes Moist: Yes Moist: Yes Moisture: Maceration: No Maceration: No Maceration: No Dry/Scaly: No Dry/Scaly: No Dry/Scaly: No Periwound Skin Color: Atrophie Blanche: No Atrophie Blanche: No Atrophie Blanche: No Cyanosis: No Cyanosis: No Cyanosis: No Ecchymosis: No Ecchymosis: No Ecchymosis: No Erythema: No Erythema: No Erythema: No Hemosiderin Staining: No Hemosiderin Staining: No Hemosiderin Staining: No Mottled: No Mottled: No Mottled: No Pallor: No Pallor: No Pallor: No Rubor: No Rubor: No Rubor: No Temperature: No Abnormality No Abnormality No Abnormality Tenderness on Yes Yes Yes Palpation: Wound Preparation: Ulcer Cleansing: Ulcer Cleansing: Ulcer Cleansing: Rinsed/Irrigated with Rinsed/Irrigated with  Rinsed/Irrigated with Saline Saline Saline Topical Anesthetic Topical Anesthetic Topical Anesthetic Applied: Other: lidocaine 4 Applied: Other: lidocaine Applied: Other: lidocaine % 4% 4% Assessment Notes: N/A N/A N/A Wound Number: 4 N/A N/A Photos: No Photos N/A N/A Wound Location: Right Calcaneus - Medial N/A N/A Wounding Event: Pressure Injury N/A N/A Primary Etiology: Pressure Ulcer N/A N/A [name], [name] ([id number]) Comorbid History: Cataracts, Anemia, N/A N/A Hypertension, History of pressure wounds, Osteoarthritis, Dementia Date Acquired: 01/02/2016 N/A N/A Weeks of Treatment: 1 N/A N/A Wound Status: Open N/A N/A Measurements L x W x D 6x8x0.2 N/A N/A (cm) Area (cm) : 37.699 N/A N/A Volume (cm) : 7.54 N/A N/A % Reduction in Area: -2299.70% N/A N/A % Reduction in Volume: -4702.50% N/A N/A Classification: Category/Stage II N/A N/A Exudate Amount: Medium N/A N/A Exudate Type: Serosanguineous N/A N/A Exudate Color: red, brown N/A N/A Foul Odor After No N/A N/A Cleansing: Odor Anticipated Due to N/A N/A N/A Product Use: Wound Margin: Distinct, outline attached N/A N/A Granulation Amount: Small (1-33%) N/A N/A Granulation Quality: Red, Pink  N/A N/A Necrotic Amount: Large (67-100%) N/A N/A Exposed Structures: Fascia: No N/A N/A Fat: No Tendon: No Muscle: No Joint: No Bone: No Limited to Skin Breakdown Epithelialization: None N/A N/A Periwound Skin Texture: Edema: No N/A N/A Excoriation: No Induration: No Callus: No Crepitus: No Fluctuance: No Friable: No Rash: No Scarring: No Periwound Skin Moist: Yes N/A N/A Moisture: Maceration: No Dry/Scaly: No Periwound Skin Color: Atrophie Blanche: No N/A N/A Cyanosis: No Ecchymosis: No Erythema: No [name], [name] ([id number]) Hemosiderin Staining: No Mottled: No Pallor: No Rubor: No Temperature: No Abnormality N/A N/A Tenderness on Yes N/A N/A Palpation: Wound Preparation: Ulcer Cleansing:  N/A N/A Rinsed/Irrigated with Saline Topical Anesthetic Applied: Other: Lidocaine 4% Assessment Notes: Converted with left lateral N/A N/A calcaneous. Both wounds merged together. Treatment Notes Electronic Signature(s) Signed: 04/01/2016 5:39:43 PM By: Elpidio Eric BSN, RN Entered By: Elpidio Eric on 04/01/2016 16:16:43 [name], [name] ([id number]) -------------------------------------------------------------------------------- Multi-Disciplinary Care Plan Details [name] Date of Service: 04/01/2016 3:45 PM Patient Name: [name] Account Number: [id number] Medical Record Treating RN: Clover Mealy RN, BSN, [place] Sink [id number] Number: Other Clinician: Sep 15, 1920 (82 y.o. Treating [name], [name] Date of Birth/Sex: Female) Physician/Extender: G Primary Care FULLER, SUSAN Physician: Referring Physician: Meliton Rattan in Treatment: 1 Active Inactive Orientation to the Wound Care Program Nursing Diagnoses: Knowledge deficit related to the wound healing center program Goals: Patient/caregiver will verbalize understanding of the Wound Healing Center Program Date Initiated: 03/24/2016 Goal Status: Active Interventions: Provide education on orientation to the wound center Notes: Pressure Nursing Diagnoses: Knowledge deficit related to causes and risk factors for pressure ulcer development Knowledge deficit related to management of pressures ulcers Potential for impaired tissue integrity related to pressure, friction, moisture, and shear Goals: Patient will remain free from development of additional pressure ulcers Date Initiated: 03/24/2016 Goal Status: Active Patient will remain free of pressure ulcers Date Initiated: 03/24/2016 Goal Status: Active Patient/caregiver will verbalize risk factors for pressure ulcer development Date Initiated: 03/24/2016 Goal Status: Active Patient/caregiver will verbalize understanding of pressure ulcer management KILEIGH, ORTMANN  ([id number]) Date Initiated: 03/24/2016 Goal Status: Active Interventions: Assess: immobility, friction, shearing, incontinence upon admission and as needed Assess offloading mechanisms upon admission and as needed Assess potential for pressure ulcer upon admission and as needed Provide education on pressure ulcers Treatment Activities: Patient referred for home evaluation of offloading devices/mattresses : 03/24/2016 Patient referred for pressure reduction/relief devices : 03/24/2016 Patient referred for seating evaluation to ensure proper offloading : 03/24/2016 Pressure reduction/relief device ordered : 03/24/2016 Notes: Wound/Skin Impairment Nursing Diagnoses: Impaired tissue integrity Knowledge deficit related to smoking impact on wound healing Knowledge deficit related to ulceration/compromised skin integrity Goals: Patient/caregiver will verbalize understanding of skin care regimen Date Initiated: 03/24/2016 Goal Status: Active Ulcer/skin breakdown will have a volume reduction of 30% by week 4 Date Initiated: 03/24/2016 Goal Status: Active Ulcer/skin breakdown will have a volume reduction of 50% by week 8 Date Initiated: 03/24/2016 Goal Status: Active Ulcer/skin breakdown will have a volume reduction of 80% by week 12 Date Initiated: 03/24/2016 Goal Status: Active Ulcer/skin breakdown will heal within 14 weeks Date Initiated: 03/24/2016 Goal Status: Active Interventions: Assess patient/caregiver ability to obtain necessary supplies Assess patient/caregiver ability to perform ulcer/skin care regimen upon admission and as needed Assess ulceration(s) every visit ZAMYIA, GOWELL ([id number]) Provide education on ulcer and skin care Treatment Activities: Patient referred to home care : 03/24/2016 Skin care regimen initiated : 03/24/2016 Topical wound management initiated : 03/24/2016 Notes: Electronic Signature(s) Signed: 04/01/2016 5:39:43 PM  By: Elpidio Eric BSN, RN Entered By: Elpidio Eric on 04/01/2016 16:16:25 [name], [name] ([id number]) -------------------------------------------------------------------------------- Pain Assessment Details [name] Date of Service: 04/01/2016 3:45 PM Patient Name: [name] Account Number: [id number] Medical Record Treating RN: Clover Mealy RN, BSN, [place] Sink [id number] Number: Other Clinician: 20-Mar-1921 (82 y.o. Treating [name], [name] Date of Birth/Sex: Female) Physician/Extender: G Primary Care FULLER, SUSAN Physician: Referring Physician: [name] Weeks in Treatment: 1 Active Problems Location of Pain Severity and Description of Pain Patient Has Paino Patient Unable to Respond Site Locations Pain Management and Medication Current Pain Management: Electronic Signature(s) Signed: 04/01/2016 5:39:43 PM By: Elpidio Eric BSN, RN Entered By: Elpidio Eric on 04/01/2016 15:42:46 Brecheisen, [name] ([id number]) -------------------------------------------------------------------------------- Patient/Caregiver Education Details [name] Date of Service: 04/01/2016 3:45 PM Patient Name: [name] Account Number: [id number] Medical Record Treating RN: Clover Mealy RN, BSN, [place] Sink [id number] Number: Other Clinician: 1921-02-02 (82 y.o. Treating [name], [name] Date of Birth/Gender: Female) Physician/Extender: G Primary Care Physician: Meliton Rattan in Treatment: 1 Referring Physician: [name] Education Assessment Education Provided To: Patient Education Topics Provided Pressure: Methods: Explain/Verbal Responses: State content correctly Welcome To The Wound Care Center: Methods: Explain/Verbal Responses: State content correctly Wound/Skin Impairment: Methods: Explain/Verbal Responses: State content correctly Electronic Signature(s) Signed: 04/01/2016 5:39:43 PM By: Elpidio Eric BSN, RN Entered By: Elpidio Eric on 04/01/2016 16:53:16 [place], [name]  ([id number]) -------------------------------------------------------------------------------- Wound Assessment Details [name] Date of Service: 04/01/2016 3:45 PM Patient Name: [name] Account Number: [id number] Medical Record Treating RN: Clover Mealy RN, BSN, [place] Sink [id number] Number: Other Clinician: Dec 14, 1920 (82 y.o. Treating [name], [name] Date of Birth/Sex: Female) Physician/Extender: G Primary Care FULLER, SUSAN Physician: Referring Physician: [name] Weeks in Treatment: 1 Wound Status Wound Number: 1 Primary Pressure Ulcer Etiology: Wound Location: Right, Lateral Calcaneus Wound Converted Wounding Event: Pressure Injury Status: Date Acquired: 01/02/2016 Comorbid Cataracts, Anemia, Hypertension, Weeks Of Treatment: 1 History: History of pressure wounds, Clustered Wound: No Osteoarthritis, Dementia Photos Photo Uploaded By: Elpidio Eric on 04/01/2016 17:37:29 Wound Measurements Length: (cm) 0 % Reducti Width: (cm) 0 % Reducti Depth: (cm) 0 Epithelia Area: (cm) 0 Tunnelin Volume: (cm) 0 Undermin on in Area: 100% on in Volume: 100% lization: None g: No ing: No Wound Description Classification: Category/Stage IV Wound Margin: Distinct, outline attached Exudate Amount: Large Exudate Type: Serosanguineous Exudate Color: red, brown Foul Odor After Cleansing: Yes Due to Product Use: No Wound Bed Stogner, Melitta L. ([id number]) Granulation Amount: Small (1-33%) Exposed Structure Granulation Quality: Pink, Pale Fascia Exposed: No Necrotic Amount: Large (67-100%) Fat Layer Exposed: No Necrotic Quality: Adherent Slough Tendon Exposed: Yes Muscle Exposed: No Joint Exposed: No Bone Exposed: No Periwound Skin Texture Texture Color No Abnormalities Noted: No No Abnormalities Noted: No Callus: No Atrophie Blanche: No Crepitus: No Cyanosis: No Excoriation: No Ecchymosis: No Fluctuance: No Erythema: No Friable: No Hemosiderin Staining:  No Induration: No Mottled: No Localized Edema: Yes Pallor: No Rash: No Rubor: No Scarring: No Temperature / Pain Moisture Temperature: No Abnormality No Abnormalities Noted: No Tenderness on Palpation: Yes Dry / Scaly: No Maceration: No Moist: Yes Wound Preparation Ulcer Cleansing: Rinsed/Irrigated with Saline Topical Anesthetic Applied: Other: lidocaine 4 %, Electronic Signature(s) Signed: 04/01/2016 5:39:43 PM By: Elpidio Eric BSN, RN Entered By: Elpidio Eric on 04/01/2016 16:06:21 [name], [name] ([id number]) -------------------------------------------------------------------------------- Wound Assessment Details [name] Date of Service: 04/01/2016 3:45 PM Patient Name: [name] Account Number: [id number] Medical Record Treating RN: Clover Mealy RN, BSN, [place] Sink [id number] Number: Other Clinician: 01/15/1921 (82 y.o. Treating [name],  [name] Date of Birth/Sex: Female) Physician/Extender: G Primary Care FULLER, SUSAN Physician: Referring Physician: [name] Weeks in Treatment: 1 Wound Status Wound Number: 2 Primary Pressure Ulcer Etiology: Wound Location: Right Lower Leg - Lateral, Proximal Wound Open Status: Wounding Event: Pressure Injury Comorbid Cataracts, Anemia, Hypertension, Date Acquired: 01/02/2016 History: History of pressure wounds, Weeks Of Treatment: 1 Osteoarthritis, Dementia Clustered Wound: No Photos Photo Uploaded By: Elpidio Eric on 04/01/2016 17:38:09 Wound Measurements Length: (cm) 1.7 Width: (cm) 0.8 Depth: (cm) 0.8 Area: (cm) 1.068 Volume: (cm) 0.855 % Reduction in Area: 32% % Reduction in Volume: 32% Epithelialization: None Tunneling: No Undermining: No Wound Description Classification: Category/Stage IV Foul Odor Af Wound Margin: Distinct, outline attached Due to Produ Exudate Amount: Medium Exudate Type: Serosanguineous Exudate Color: red, brown ter Cleansing: Yes ct Use: No Wound Bed Hsiao, Miniya L.  ([id number]) Granulation Amount: Medium (34-66%) Exposed Structure Granulation Quality: Red Fascia Exposed: No Necrotic Amount: Small (1-33%) Fat Layer Exposed: No Necrotic Quality: Adherent Slough Tendon Exposed: Yes Muscle Exposed: No Joint Exposed: No Bone Exposed: No Periwound Skin Texture Texture Color No Abnormalities Noted: No No Abnormalities Noted: No Callus: No Atrophie Blanche: No Crepitus: No Cyanosis: No Excoriation: No Ecchymosis: No Fluctuance: No Erythema: No Friable: No Hemosiderin Staining: No Induration: No Mottled: No Localized Edema: No Pallor: No Rash: No Rubor: No Scarring: No Temperature / Pain Moisture Temperature: No Abnormality No Abnormalities Noted: No Tenderness on Palpation: Yes Dry / Scaly: No Maceration: No Moist: Yes Wound Preparation Ulcer Cleansing: Rinsed/Irrigated with Saline Topical Anesthetic Applied: Other: lidocaine 4%, Treatment Notes Wound #2 (Right, Proximal, Lateral Lower Leg) 1. Cleansed with: Clean wound with Normal Saline 4. Dressing Applied: Aquacel Ag 5. Secondary Dressing Applied ABD Pad 7. Secured with Secretary/administrator) Signed: 04/01/2016 5:39:43 PM By: Elpidio Eric BSN, RN Entered By: Elpidio Eric on 04/01/2016 16:00:14 [name], [name] ([id number]) -------------------------------------------------------------------------------- Wound Assessment Details [name] Date of Service: 04/01/2016 3:45 PM Patient Name: [name] Account Number: [id number] Medical Record Treating RN: Clover Mealy RN, BSN, [place] Sink [id number] Number: Other Clinician: Dec 24, 1920 (82 y.o. Treating [name], [name] Date of Birth/Sex: Female) Physician/Extender: G Primary Care FULLER, SUSAN Physician: Referring Physician: [name] Weeks in Treatment: 1 Wound Status Wound Number: 3 Primary Pressure Ulcer Etiology: Wound Location: Lower Leg - Lateral, Distal Wound Open Wounding Event: Pressure  Injury Status: Date Acquired: 01/02/2016 Comorbid Cataracts, Anemia, Hypertension, Weeks Of Treatment: 1 History: History of pressure wounds, Clustered Wound: No Osteoarthritis, Dementia Photos Photo Uploaded By: Elpidio Eric on 04/01/2016 17:38:09 Wound Measurements Length: (cm) 2.5 Width: (cm) 1 Depth: (cm) 0.9 Area: (cm) 1.963 Volume: (cm) 1.767 % Reduction in Area: 50% % Reduction in Volume: 55% Epithelialization: None Tunneling: No Undermining: No Wound Description Classification: Category/Stage III Foul Odor Af Wound Margin: Distinct, outline attached Due to Produ Exudate Amount: Medium Exudate Type: Serosanguineous Exudate Color: red, brown ter Cleansing: Yes ct Use: No Wound Bed Ledlow, [place] L. ([id number]) Granulation Amount: Small (1-33%) Exposed Structure Granulation Quality: Pink, Pale Fascia Exposed: No Necrotic Amount: Medium (34-66%) Fat Layer Exposed: No Necrotic Quality: Adherent Slough Tendon Exposed: No Muscle Exposed: No Joint Exposed: No Bone Exposed: No Limited to Skin Breakdown Periwound Skin Texture Texture Color No Abnormalities Noted: No No Abnormalities Noted: No Callus: No Atrophie Blanche: No Crepitus: No Cyanosis: No Excoriation: No Ecchymosis: No Fluctuance: No Erythema: No Friable: No Hemosiderin Staining: No Induration: No Mottled: No Localized Edema: No Pallor: No Rash: No Rubor: No Scarring: No Temperature / Pain Moisture Temperature: No  Abnormality No Abnormalities Noted: No Tenderness on Palpation: Yes Dry / Scaly: No Maceration: No Moist: Yes Wound Preparation Ulcer Cleansing: Rinsed/Irrigated with Saline Topical Anesthetic Applied: Other: lidocaine 4%, Treatment Notes Wound #3 (Distal, Lateral Lower Leg) 1. Cleansed with: Clean wound with Normal Saline 4. Dressing Applied: Aquacel Ag 5. Secondary Dressing Applied ABD Pad 7. Secured with Secretary/administrator) Signed: 04/01/2016  5:39:43 PM By: Elpidio Eric BSN, RN Entered By: Elpidio Eric on 04/01/2016 16:03:24 [name], SALLE ([id number]) SHIRL, WEIR ([id number]) -------------------------------------------------------------------------------- Wound Assessment Details [name] Date of Service: 04/01/2016 3:45 PM Patient Name: [name] Account Number: [id number] Medical Record Treating RN: Clover Mealy RN, BSN, [place] Sink [id number] Number: Other Clinician: March 20, 1921 (82 y.o. Treating [name], [name] Date of Birth/Sex: Female) Physician/Extender: G Primary Care FULLER, SUSAN Physician: Referring Physician: [name] Weeks in Treatment: 1 Wound Status Wound Number: 4 Primary Pressure Ulcer Etiology: Wound Location: Right Calcaneus - Medial Wound Open Wounding Event: Pressure Injury Status: Date Acquired: 01/02/2016 Comorbid Cataracts, Anemia, Hypertension, Weeks Of Treatment: 1 History: History of pressure wounds, Clustered Wound: No Osteoarthritis, Dementia Photos Photo Uploaded By: Elpidio Eric on 04/01/2016 17:37:30 Wound Measurements Length: (cm) 6 Width: (cm) 8 Depth: (cm) 0.2 Area: (cm) 37.699 Volume: (cm) 7.54 % Reduction in Area: -2299.7% % Reduction in Volume: -4702.5% Epithelialization: None Tunneling: No Undermining: No Wound Description Classification: Category/Stage II Wound Margin: Distinct, outline attached Exudate Amount: Medium Exudate Type: Serosanguineous Exudate Color: red, brown Foul Odor After Cleansing: No Wound Bed Issa, Marleen L. ([id number]) Granulation Amount: Small (1-33%) Exposed Structure Granulation Quality: Red, Pink Fascia Exposed: No Necrotic Amount: Large (67-100%) Fat Layer Exposed: No Necrotic Quality: Adherent Slough Tendon Exposed: No Muscle Exposed: No Joint Exposed: No Bone Exposed: No Limited to Skin Breakdown Periwound Skin Texture Texture Color No Abnormalities Noted: No No Abnormalities Noted: No Callus:  No Atrophie Blanche: No Crepitus: No Cyanosis: No Excoriation: No Ecchymosis: No Fluctuance: No Erythema: No Friable: No Hemosiderin Staining: No Induration: No Mottled: No Localized Edema: No Pallor: No Rash: No Rubor: No Scarring: No Temperature / Pain Moisture Temperature: No Abnormality No Abnormalities Noted: No Tenderness on Palpation: Yes Dry / Scaly: No Maceration: No Moist: Yes Wound Preparation Ulcer Cleansing: Rinsed/Irrigated with Saline Topical Anesthetic Applied: Other: Lidocaine 4%, Assessment Notes Converted with left lateral calcaneous. Both wounds merged together. Treatment Notes Wound #4 (Right, Medial Calcaneus) 1. Cleansed with: Clean wound with Normal Saline 4. Dressing Applied: Aquacel Ag 5. Secondary Dressing Applied ABD Pad 7. Secured with Secretary/administrator) Signed: 04/01/2016 5:39:43 PM By: Elpidio Eric BSN, RN [address], [place] ([id number]) Entered By: Elpidio Eric on 04/01/2016 16:08:37 [place], [name] ([id number]) -------------------------------------------------------------------------------- Vitals Details [name] Date of Service: 04/01/2016 3:45 PM Patient Name: [name] Account Number: [id number] Medical Record Treating RN: Clover Mealy RN, BSN, [place] Sink [id number] Number: Other Clinician: 1920/10/24 (82 y.o. Treating [name], [name] Date of Birth/Sex: Female) Physician/Extender: G Primary Care FULLER, SUSAN Physician: Referring Physician: [name] Weeks in Treatment: 1 Vital Signs Time Taken: 15:44 Temperature (F): 98.2 Height (in): 68 Pulse (bpm): 91 Weight (lbs): 118 Respiratory Rate (breaths/min): 18 Body Mass Index (BMI): 17.9 Blood Pressure (mmHg): 141/69 Reference Range: 80 - 120 mg / dl Electronic Signature(s) Signed: 04/01/2016 5:39:43 PM By: Elpidio Eric BSN, RN Entered By: Elpidio Eric on 04/01/2016 15:45:38

## 2016-04-02 NOTE — Progress Notes (Signed)
KIYANI, JERNIGAN ([id number]) Visit Report for 04/01/2016 Chief Complaint Document Details VUNG, KUSH Date of Service: 04/01/2016 3:45 PM Patient Name: [name] Account Number: [id number] Medical Record Treating RN: [name] [id number] Number: Other Clinician: 1921-03-22 (82 y.o. Treating [name], [name] Date of Birth/Sex: Female) Physician/Extender: G Primary Care FULLER, SUSAN Physician: Referring Physician: Meliton Rattan in Treatment: 1 Information Obtained from: Patient Chief Complaint 03/24/16; patient is here for review wounds on the right lower extremity Electronic Signature(s) Signed: 04/02/2016 7:42:49 AM By: Baltazar Najjar MD Entered By: Baltazar Najjar on 04/01/2016 18:45:40 Schnarr, [name] -------------------------------------------------------------------------------- HPI Details [name] Date of Service: 04/01/2016 3:45 PM Patient Name: [name] Account Number: [id number] Medical Record Treating RN: [name] [id number] Number: Other Clinician: [birth year] (82 y.o. Treating [name], [name] Date of Birth/Sex: Female) Physician/Extender: G Primary Care FULLER, SUSAN Physician: Referring Physician: Meliton Rattan in Treatment: 1 History of Present Illness HPI Description: 03/24/16; this is an elderly 82 year old woman with advanced dementia who was hospitalized from 5/8 through 5/13. At that point she had Proteus sepsis felt to be secondary to a UTI the. Acute kidney injury and delirium. Her son who is her primary caregiver says she came home from the hospital with wounds on her right lower extremity and apparently her right buttock as well. There is no mention on the hospital discharge summary of problems. She has been having [facility] and have been using silver alginate based dressings. Apparently the area on the right buttock is healing and the son stated we did not need to become involved with that.  In any case the patient has for wounds to on the right heel and 2 on the posterior lateral right calf, the latter of which is not a usual pressure area however that is the history. She is apparently eating and drinking fairly well. Takes ensure well. She does not have any pressure-relief surface at home. I have reviewed her lab work from the hospital. Her admission albumin was 3.4 on 5/8 04/01/16; patient arrives with wounds looking much the same as last week. She has an extensive pressure area over her right heel and to small but deep wounds on the lateral aspect of her right lower leg. These wounds are not connected. Although there are advertised his pressure ulcers these 2 wounds are not usual for pressure ulcerations. We have not looked at the pressure ulceration on her buttock at the request of the patient's son who is the primary caregiver Electronic Signature(s) Signed: 04/02/2016 7:42:49 AM By: Baltazar Najjar MD Entered By: Baltazar Najjar on 04/01/2016 18:47:11 Bolt, [name] -------------------------------------------------------------------------------- Physical Exam Details [name] Date of Service: 04/01/2016 3:45 PM Patient Name: [name] Account Number: [id number] Medical Record Treating RN: [name] [id number] Number: Other Clinician: 1921/06/10 (82 y.o. Treating [name], [name] Date of Birth/Sex: Female) Physician/Extender: G Primary Care FULLER, SUSAN Physician: Referring Physician: [name] Weeks in Treatment: 1 Constitutional Sitting or standing Blood Pressure is within target range for patient.. Pulse regular and within target range for patient.Marland Kitchen Respirations regular, non-labored and within target range.. Temperature is normal and within the target range for the patient.. Patient with advanced dementia however she does not look to be acutely medically unwell. Eyes Conjunctivae clear. No discharge.Marland Kitchen Respiratory Respiratory  effort is easy and symmetric bilaterally. Rate is normal at rest and on room air.. Cardiovascular Heart rhythm and rate regular, without murmur or gallop.Shanon Rosser feel her dorsalis pedis or posterior tibial pulses however her popliteal and  femoral pulses are palpable.. Lymphatic Nonpalpable in the popliteal or inguinal area. Integumentary (Hair, Skin) There is no obvious skin lesions here. Areas on the right lateral leg have no surrounding crepitus and no erythema.Marland Kitchen Psychiatric Severe dementia however this does not appear to be unstable. Notes Wound exam; there were 2 areas on the right heel last week these of colon last. The patient's son states she does have a bunny boot and attempt to offload this at night. There is no evidence of infection here however this is up substantial stage II wound. The 2 small wounds on her right lateral leg are deep punched out holes. There is no evidence of surrounding infection nevertheless I went ahead and cultured the lower area. Once again these are not usual pressure areas Electronic Signature(s) Signed: 04/02/2016 7:42:49 AM By: Baltazar Najjar MD Entered By: Baltazar Najjar on 04/01/2016 18:51:38 Mullan, [name] ([id number]) -------------------------------------------------------------------------------- Physician Orders Details [name] Date of Service: 04/01/2016 3:45 PM Patient Name: [name] Account Number: [id number] Medical Record Treating RN: Clover Mealy RN, BSN, [place] [id number] Number: Other Clinician: [birth year] (82 y.o. Treating [name], [name] Date of Birth/Sex: Female) Physician/Extender: G Primary Care FULLER, SUSAN Physician: Referring Physician: Meliton Rattan in Treatment: 1 Verbal / Phone Orders: Yes Clinician: Afful, RN, BSN, Rita Read Back and Verified: Yes Diagnosis Coding Wound Cleansing Wound #2 Right,Proximal,Lateral Lower Leg o Cleanse wound with mild soap and water o May Shower, gently pat  wound dry prior to applying new dressing. o May shower with protection. Wound #3 Distal,Lateral Lower Leg o Cleanse wound with mild soap and water o May Shower, gently pat wound dry prior to applying new dressing. o May shower with protection. Wound #4 Right,Medial Calcaneus o Cleanse wound with mild soap and water o May Shower, gently pat wound dry prior to applying new dressing. o May shower with protection. Anesthetic Wound #2 Right,Proximal,Lateral Lower Leg o Topical Lidocaine 4% cream applied to wound bed prior to debridement Wound #3 Distal,Lateral Lower Leg o Topical Lidocaine 4% cream applied to wound bed prior to debridement Wound #4 Right,Medial Calcaneus o Topical Lidocaine 4% cream applied to wound bed prior to debridement Skin Barriers/Peri-Wound Care o Barrier cream Primary Wound Dressing Wound #2 Right,Proximal,Lateral Lower Leg o Aquacel Ag Wound #3 Distal,Lateral Lower Leg Outen, [place] L. ([id number]) o Aquacel Ag Wound #4 Right,Medial Calcaneus o Aquacel Ag Secondary Dressing Wound #2 Right,Proximal,Lateral Lower Leg o ABD and Kerlix/Conform Wound #3 Distal,Lateral Lower Leg o ABD and Kerlix/Conform Wound #4 Right,Medial Calcaneus o ABD and Kerlix/Conform Dressing Change Frequency Wound #2 Right,Proximal,Lateral Lower Leg o Change dressing every other day. Wound #3 Distal,Lateral Lower Leg o Change dressing every other day. Wound #4 Right,Medial Calcaneus o Change dressing every other day. Follow-up Appointments Wound #2 Right,Proximal,Lateral Lower Leg o Return Appointment in 1 week. Wound #3 Distal,Lateral Lower Leg o Return Appointment in 1 week. Wound #4 Right,Medial Calcaneus o Return Appointment in 1 week. Off-Loading Wound #2 Right,Proximal,Lateral Lower Leg o Heel suspension boot to: - SAGE BOOTS o Turn and reposition every 2 hours Wound #3 Distal,Lateral Lower Leg o Heel  suspension boot to: - SAGE BOOTS o Turn and reposition every 2 hours Wound #4 Right,Medial Calcaneus o Heel suspension boot to: - SAGE BOOTS o Turn and reposition every 2 hours Blash, Caitlain L. ([id number]) Additional Orders / Instructions Wound #2 Right,Proximal,Lateral Lower Leg o Increase protein intake. - Ensure, Boost o Other: - VItamin C, A, MVI, Zinc Wound #3 Distal,Lateral Lower Leg o Increase protein  intake. - Ensure, Boost o Other: - VItamin C, A, MVI, Zinc Wound #4 Right,Medial Calcaneus o Increase protein intake. - Ensure, Boost o Other: - VItamin C, A, MVI, Zinc Home Health Wound #2 Right,Proximal,Lateral Lower Leg o Continue Home Health Visits - [facility] o Home Health Nurse may visit PRN to address patientos wound care needs. o FACE TO FACE ENCOUNTER: MEDICARE and MEDICAID PATIENTS: I certify that this patient is under my care and that I had a face-to-face encounter that meets the physician face-to-face encounter requirements with this patient on this date. The encounter with the patient was in whole or in part for the following MEDICAL CONDITION: (primary reason for Home Healthcare) MEDICAL NECESSITY: I certify, that based on my findings, NURSING services are a medically necessary home health service. HOME BOUND STATUS: I certify that my clinical findings support that this patient is homebound (i.e., Due to illness or injury, pt requires aid of supportive devices such as crutches, cane, wheelchairs, walkers, the use of special transportation or the assistance of another person to leave their place of residence. There is a normal inability to leave the home and doing so requires considerable and taxing effort. Other absences are for medical reasons / religious services and are infrequent or of short duration when for other reasons). o If current dressing causes regression in wound condition, may D/C ordered dressing product/s and  apply Normal Saline Moist Dressing daily until next Wound Healing Center / Other MD appointment. Notify Wound Healing Center of regression in wound condition at [phone]. o Please direct any NON-WOUND related issues/requests for orders to patient's Primary Care Physician Wound #3 Distal,Lateral Lower Leg o Continue Home Health Visits - [facility] o Home Health Nurse may visit PRN to address patientos wound care needs. o FACE TO FACE ENCOUNTER: MEDICARE and MEDICAID PATIENTS: I certify that this patient is under my care and that I had a face-to-face encounter that meets the physician face-to-face encounter requirements with this patient on this date. The encounter with the patient was in whole or in part for the following MEDICAL CONDITION: (primary reason for Home Healthcare) MEDICAL NECESSITY: I certify, that based on my findings, NURSING services are a medically necessary home health service. HOME BOUND STATUS: I certify that my clinical findings support that this patient is homebound (i.e., Due to illness or injury, pt requires aid of supportive devices such as crutches, cane, wheelchairs, walkers, the use of special transportation or the assistance of another person to leave their place of residence. There is a normal inability to leave the home and doing so requires considerable and taxing effort. Other Coralee RudSUMMERS, Vergene L. ([id number]) absences are for medical reasons / religious services and are infrequent or of short duration when for other reasons). o If current dressing causes regression in wound condition, may D/C ordered dressing product/s and apply Normal Saline Moist Dressing daily until next Wound Healing Center / Other MD appointment. Notify Wound Healing Center of regression in wound condition at [phone]. o Please direct any NON-WOUND related issues/requests for orders to patient's Primary Care Physician Wound #4 Right,Medial Calcaneus o  Continue Home Health Visits - [facility] o Home Health Nurse may visit PRN to address patientos wound care needs. o FACE TO FACE ENCOUNTER: MEDICARE and MEDICAID PATIENTS: I certify that this patient is under my care and that I had a face-to-face encounter that meets the physician face-to-face encounter requirements with this patient on this date. The encounter with the patient  was in whole or in part for the following MEDICAL CONDITION: (primary reason for Home Healthcare) MEDICAL NECESSITY: I certify, that based on my findings, NURSING services are a medically necessary home health service. HOME BOUND STATUS: I certify that my clinical findings support that this patient is homebound (i.e., Due to illness or injury, pt requires aid of supportive devices such as crutches, cane, wheelchairs, walkers, the use of special transportation or the assistance of another person to leave their place of residence. There is a normal inability to leave the home and doing so requires considerable and taxing effort. Other absences are for medical reasons / religious services and are infrequent or of short duration when for other reasons). o If current dressing causes regression in wound condition, may D/C ordered dressing product/s and apply Normal Saline Moist Dressing daily until next Wound Healing Center / Other MD appointment. Notify Wound Healing Center of regression in wound condition at [phone]. o Please direct any NON-WOUND related issues/requests for orders to patient's Primary Care Physician Laboratory o Bacteria identified in Wound by Culture (MICRO) - right lower leg oooo LOINC Code: 6462-6 oooo Convenience Name: Wound culture routine Electronic Signature(s) Signed: 04/01/2016 5:39:43 PM By: Elpidio Eric BSN, RN Signed: 04/02/2016 7:42:49 AM By: Baltazar Najjar MD Entered By: Elpidio Eric on 04/01/2016 16:18:25 Ivie, [name]  ([id number]) -------------------------------------------------------------------------------- Problem List Details [name] Date of Service: 04/01/2016 3:45 PM Patient Name: [name] Account Number: [id number] Medical Record Treating RN: [name] [id number] Number: Other Clinician: April 21, 1921 (82 y.o. 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 Account Number: [id number] Medical Record Treating RN: [name] [id number] Number: Other Clinician: 12-03-1920 (82 y.o. Treating [name], [name] Date of Birth/Sex: Female) Physician/Extender: G Primary Care FULLER, SUSAN Physician: Referring Physician: Meliton Rattan in Treatment: 1 Subjective Chief Complaint Information obtained from Patient 03/24/16; patient is here for review wounds on the right lower extremity History of Present Illness (HPI) 03/24/16; this is an elderly 82 year old woman with advanced dementia who was hospitalized from 5/8 through 5/13. At that point she had Proteus sepsis felt to be secondary to a UTI the. Acute kidney injury and delirium. Her son who is her primary  caregiver says she came home from the hospital with wounds on her right lower extremity and apparently her right buttock as well. There is no mention on the hospital discharge summary of problems. She has been having [facility] and have been using silver alginate based dressings. Apparently the area on the right buttock is healing and the son stated we did not need to become involved with that. In any case the patient has for wounds to on the right heel and 2 on the posterior lateral right calf, the latter of which is not a usual pressure area however that is the history. She is apparently eating and drinking fairly well. Takes ensure well. She does not have  any pressure-relief surface at home. I have reviewed her lab work from the hospital. Her admission albumin was 3.4 on 5/8 04/01/16; patient arrives with wounds looking much the same as last week. She has an extensive pressure area over her right heel and to small but deep wounds on the lateral aspect of her right lower leg. These wounds are not connected. Although there are advertised his pressure ulcers these 2 wounds are not usual for pressure ulcerations. We have not looked at the pressure ulceration on her buttock at the request of the patient's son who is the primary caregiver Objective Constitutional Sitting or standing Blood Pressure is within target range for patient.. Pulse regular and within target range for patient.Marland Kitchen Respirations regular, non-labored and within target range.. Temperature is normal and within the target range for the patient.. Patient with advanced dementia however she does not look to be acutely medically unwell. SINA, SUMPTER ([id number]) Vitals Time Taken: 3:44 PM, Height: 68 in, Weight: 118 lbs, BMI: 17.9, Temperature: 98.2 F, Pulse: 91 bpm, Respiratory Rate: 18 breaths/min, Blood Pressure: 141/69 mmHg. Eyes Conjunctivae clear. No discharge.Marland Kitchen Respiratory Respiratory effort is easy and  symmetric bilaterally. Rate is normal at rest and on room air.. Cardiovascular Heart rhythm and rate regular, without murmur or gallop.Shanon Rosser feel her dorsalis pedis or posterior tibial pulses however her popliteal and femoral pulses are palpable.. Lymphatic Nonpalpable in the popliteal or inguinal area. Psychiatric Severe dementia however this does not appear to be unstable. General Notes: Wound exam; there were 2 areas on the right heel last week these of colon last. The patient's son states she does have a bunny boot and attempt to offload this at night. There is no evidence of infection here however this is up substantial stage II wound. The 2 small wounds on her right lateral leg are deep punched out holes. There is no evidence of surrounding infection nevertheless I went ahead and cultured the lower area. Once again these are not usual pressure areas Integumentary (Hair, Skin) There is no obvious skin lesions here. Areas on the right lateral leg have no surrounding crepitus and no erythema.. Wound #1 status is Converted. Original cause of wound was Pressure Injury. The wound is located on the Right,Lateral Calcaneus. The wound measures 0cm length x 0cm width x 0cm depth; 0cm^2 area and 0cm^3 volume. There is tendon exposed. There is no tunneling or undermining noted. There is a large amount of serosanguineous drainage noted. The wound margin is distinct with the outline attached to the wound base. There is small (1-33%) pink, pale granulation within the wound bed. There is a large (67- 100%) amount of necrotic tissue within the wound bed including Adherent Slough. The periwound skin appearance exhibited: Localized Edema, Moist. The periwound skin appearance did not exhibit: Callus, Crepitus, Excoriation, Fluctuance, Friable, Induration, Rash, Scarring, Dry/Scaly, Maceration, Atrophie Blanche, Cyanosis, Ecchymosis, Hemosiderin Staining, Mottled, Pallor, Rubor, Erythema.  Periwound temperature was noted as No Abnormality. The periwound has tenderness on palpation. Wound #2 status is Open. Original cause of wound was Pressure Injury. The wound is located on the Right,Proximal,Lateral Lower Leg. The wound measures 1.7cm length x 0.8cm width x 0.8cm depth; 1.068cm^2 area and 0.855cm^3 volume. There is tendon exposed. There is no tunneling or undermining noted. There is a medium amount of serosanguineous drainage noted. The wound margin is distinct with the outline attached to the wound base. There is medium (34-66%) red granulation within the wound bed. There is a small (1-33%) amount of necrotic  tissue within the wound bed including Adherent Slough. The periwound skin appearance exhibited: Moist. The periwound skin appearance did not exhibit: Callus, Crepitus, Excoriation, Fluctuance, Friable, Induration, Localized Edema, Rash, Scarring, Dry/Scaly, Heron, Saryna L. ([id number]) Maceration, Atrophie Blanche, Cyanosis, Ecchymosis, Hemosiderin Staining, Mottled, Pallor, Rubor, Erythema. Periwound temperature was noted as No Abnormality. The periwound has tenderness on palpation. Wound #3 status is Open. Original cause of wound was Pressure Injury. The wound is located on the Distal,Lateral Lower Leg. The wound measures 2.5cm length x 1cm width x 0.9cm depth; 1.963cm^2 area and 1.767cm^3 volume. The wound is limited to skin breakdown. There is no tunneling or undermining noted. There is a medium amount of serosanguineous drainage noted. The wound margin is distinct with the outline attached to the wound base. There is small (1-33%) pink, pale granulation within the wound bed. There is a medium (34-66%) amount of necrotic tissue within the wound bed including Adherent Slough. The periwound skin appearance exhibited: Moist. The periwound skin appearance did not exhibit: Callus, Crepitus, Excoriation, Fluctuance, Friable, Induration, Localized Edema, Rash, Scarring,  Dry/Scaly, Maceration, Atrophie Blanche, Cyanosis, Ecchymosis, Hemosiderin Staining, Mottled, Pallor, Rubor, Erythema. Periwound temperature was noted as No Abnormality. The periwound has tenderness on palpation. Wound #4 status is Open. Original cause of wound was Pressure Injury. The wound is located on the Right,Medial Calcaneus. The wound measures 6cm length x 8cm width x 0.2cm depth; 37.699cm^2 area and 7.54cm^3 volume. The wound is limited to skin breakdown. There is no tunneling or undermining noted. There is a medium amount of serosanguineous drainage noted. The wound margin is distinct with the outline attached to the wound base. There is small (1-33%) red, pink granulation within the wound bed. There is a large (67-100%) amount of necrotic tissue within the wound bed including Adherent Slough. The periwound skin appearance exhibited: Moist. The periwound skin appearance did not exhibit: Callus, Crepitus, Excoriation, Fluctuance, Friable, Induration, Localized Edema, Rash, Scarring, Dry/Scaly, Maceration, Atrophie Blanche, Cyanosis, Ecchymosis, Hemosiderin Staining, Mottled, Pallor, Rubor, Erythema. Periwound temperature was noted as No Abnormality. The periwound has tenderness on palpation. General Notes: Converted with left lateral calcaneous. Both wounds merged together. 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 ([id number]) Wound #2 Right,Proximal,Lateral Lower Leg: Cleanse wound with mild soap and water May Shower, gently pat wound dry prior to applying new dressing. May shower with protection. Wound #3 Distal,Lateral Lower Leg: Cleanse wound with mild soap and water May Shower, gently pat wound dry prior to applying new  dressing. May shower with protection. Wound #4 Right,Medial Calcaneus: Cleanse wound with mild soap and water May Shower, gently pat wound dry prior to applying new dressing. May shower with protection. Anesthetic: Wound #2 Right,Proximal,Lateral Lower Leg: Topical Lidocaine 4% cream applied to wound bed prior to debridement Wound #3 Distal,Lateral Lower Leg: Topical Lidocaine 4% cream applied to wound bed prior to debridement Wound #4 Right,Medial Calcaneus: Topical Lidocaine 4% cream applied to wound bed prior to debridement Skin Barriers/Peri-Wound Care: Barrier cream Primary Wound Dressing: Wound #2 Right,Proximal,Lateral Lower Leg: Aquacel Ag Wound #3 Distal,Lateral Lower Leg: Aquacel Ag Wound #4 Right,Medial Calcaneus: Aquacel Ag Secondary Dressing: Wound #2 Right,Proximal,Lateral Lower Leg: ABD and Kerlix/Conform Wound #3 Distal,Lateral Lower Leg: ABD and Kerlix/Conform Wound #4 Right,Medial Calcaneus: ABD and Kerlix/Conform Dressing Change Frequency: Wound #2  Right,Proximal,Lateral Lower Leg: Change dressing every other day. Wound #3 Distal,Lateral Lower Leg: Change dressing every other day. Wound #4 Right,Medial Calcaneus: Change dressing every other day. Follow-up Appointments: Wound #2 Right,Proximal,Lateral Lower Leg: Return Appointment in 1 week. Wound #3 Distal,Lateral Lower Leg: Return Appointment in 1 week. Wound #4 Right,Medial Calcaneus: Return Appointment in 1 week. Off-Loading: Wound #2 Right,Proximal,Lateral Lower Leg: MAKAI, AGOSTINELLI L. ([id number]) Heel suspension boot to: - [employer] Turn and reposition every 2 hours Wound #3 Distal,Lateral Lower Leg: Heel suspension boot to: - [employer] Turn and reposition every 2 hours Wound #4 Right,Medial Calcaneus: Heel suspension boot to: - [employer] Turn and reposition every 2 hours Additional Orders / Instructions: Wound #2 Right,Proximal,Lateral Lower Leg: Increase protein intake. - Ensure,  Boost Other: - VItamin C, A, MVI, Zinc Wound #3 Distal,Lateral Lower Leg: Increase protein intake. - Ensure, Boost Other: - VItamin C, A, MVI, Zinc Wound #4 Right,Medial Calcaneus: Increase protein intake. - Ensure, Boost Other: - VItamin C, A, MVI, Zinc Home Health: Wound #2 Right,Proximal,Lateral Lower Leg: Continue Home Health Visits - [facility] Home Health Nurse may visit PRN to address patient s wound care needs. FACE TO FACE ENCOUNTER: MEDICARE and MEDICAID PATIENTS: I certify that this patient is under my care and that I had a face-to-face encounter that meets the physician face-to-face encounter requirements with this patient on this date. The encounter with the patient was in whole or in part for the following MEDICAL CONDITION: (primary reason for Home Healthcare) MEDICAL NECESSITY: I certify, that based on my findings, NURSING services are a medically necessary home health service. HOME BOUND STATUS: I certify that my clinical findings support that this patient is homebound (i.e., Due to illness or injury, pt requires aid of supportive devices such as crutches, cane, wheelchairs, walkers, the use of special transportation or the assistance of another person to leave their place of residence. There is a normal inability to leave the home and doing so requires considerable and taxing effort. Other absences are for medical reasons / religious services and are infrequent or of short duration when for other reasons). If current dressing causes regression in wound condition, may D/C ordered dressing product/s and apply Normal Saline Moist Dressing daily until next Wound Healing Center / Other MD appointment. Notify Wound Healing Center of regression in wound condition at [phone]. Please direct any NON-WOUND related issues/requests for orders to patient's Primary Care Physician Wound #3 Distal,Lateral Lower Leg: Continue Home Health Visits - [facility] Home  Health Nurse may visit PRN to address patient s wound care needs. FACE TO FACE ENCOUNTER: MEDICARE and MEDICAID PATIENTS: I certify that this patient is under my care and that I had a face-to-face encounter that meets the physician face-to-face encounter requirements with this patient on this date. The encounter with the patient was in whole or in part for the following MEDICAL CONDITION: (primary reason for Home Healthcare) MEDICAL NECESSITY: I certify, that based on my findings, NURSING services are a medically necessary home health service. HOME BOUND STATUS: I certify that my clinical findings support that this patient is homebound (i.e., Due to illness or injury, pt requires aid of supportive devices such as crutches, cane, wheelchairs, walkers, the use of special transportation or the assistance of another person to leave their place of residence. There is a normal inability to leave the home and doing so requires considerable and taxing effort. Other absences are for medical reasons / religious services and are infrequent  or of short duration when for other reasons). If current dressing causes regression in wound condition, may D/C ordered dressing product/s and apply Normal Saline Moist Dressing daily until next Wound Healing Center / Other MD appointment. Notify Wound Fassnacht, Jaelen L. ([id number]) Healing Center of regression in wound condition at [phone]. Please direct any NON-WOUND related issues/requests for orders to patient's Primary Care Physician Wound #4 Right,Medial Calcaneus: Continue Home Health Visits - [facility] Home Health Nurse may visit PRN to address patient s wound care needs. FACE TO FACE ENCOUNTER: MEDICARE and MEDICAID PATIENTS: I certify that this patient is under my care and that I had a face-to-face encounter that meets the physician face-to-face encounter requirements with this patient on this date. The encounter with the patient was in whole  or in part for the following MEDICAL CONDITION: (primary reason for Home Healthcare) MEDICAL NECESSITY: I certify, that based on my findings, NURSING services are a medically necessary home health service. HOME BOUND STATUS: I certify that my clinical findings support that this patient is homebound (i.e., Due to illness or injury, pt requires aid of supportive devices such as crutches, cane, wheelchairs, walkers, the use of special transportation or the assistance of another person to leave their place of residence. There is a normal inability to leave the home and doing so requires considerable and taxing effort. Other absences are for medical reasons / religious services and are infrequent or of short duration when for other reasons). If current dressing causes regression in wound condition, may D/C ordered dressing product/s and apply Normal Saline Moist Dressing daily until next Wound Healing Center / Other MD appointment. Notify Wound Healing Center of regression in wound condition at [phone]. Please direct any NON-WOUND related issues/requests for orders to patient's Primary Care Physician Laboratory ordered were: Wound culture routine - right lower leg #1 continue with Aquacel Ag to all of these wounds. #2 although it is easy to explain the right heel has a pressure ulcer the 2 areas on the lateral aspect of her right leg look like arterial wounds. We are not able to do meaningful ABIs on the right leg however the left leg was 0.94. Although the pulses in her feet are difficult to feel she does have a palpable popliteal and femoral pulse are more her for foot is warm. I cannot easily ascribed the wounds as arterial. I have gone ahead and cultured the lower 1 the these although they are not clearly infectious either. Electronic Signature(s) Signed: 04/02/2016 7:42:49 AM By: Baltazar Najjar MD Entered By: Baltazar Najjar on 04/01/2016 18:53:27 Crusoe, [name]  ([id number]) -------------------------------------------------------------------------------- SuperBill Details [name] Date of Service: 04/01/2016 Patient Name: [name] Account Number: [id number] Medical Record Treating RN: [name] [id number] Number: Other Clinician: 10-04-1920 (82 y.o. Treating [name], [name] Date of Birth/Sex: Female) Physician/Extender: G Primary Care Weeks in Treatment: 1 FULLER, SUSAN Physician: Referring Physician: [name] Diagnosis Coding ICD-10 Codes Code Description [phone] Pressure ulcer of right heel, stage 2 L89.613 Pressure ulcer of right heel, stage 3 L97.213 Non-pressure chronic ulcer of right calf with necrosis of muscle G30.9 Alzheimer's disease, unspecified Facility Procedures CPT4 Code: [id number] Description: 38756 - WOUND CARE VISIT-LEV 5 EST PT Modifier: Quantity: 1 Physician Procedures CPT4 Code Description: [id number] - WC PHYS LEVEL 3 - EST PT ICD-10 Description Diagnosis L97.213 Non-pressure chronic ulcer of right calf with necro Modifier: sis of muscle Quantity: 1 Electronic Signature(s) Signed: 04/02/2016 7:42:49 AM By: Baltazar Najjar MD Entered By: Baltazar Najjar  on 04/01/2016 18:54:09

## 2016-04-05 LAB — AEROBIC CULTURE W GRAM STAIN (SUPERFICIAL SPECIMEN)

## 2016-04-05 LAB — AEROBIC CULTURE  (SUPERFICIAL SPECIMEN): GRAM STAIN: NONE SEEN

## 2016-04-08 ENCOUNTER — Encounter: Payer: Medicare Other | Admitting: Internal Medicine

## 2016-04-08 DIAGNOSIS — L89612 Pressure ulcer of right heel, stage 2: Secondary | ICD-10-CM | POA: Diagnosis not present

## 2016-04-09 NOTE — Progress Notes (Signed)
[name], [name] ([id number]) Visit Report for 04/08/2016 Arrival Information Details KAWANDA, DRUMHELLER Date of Service: 04/08/2016 2:15 PM Patient Name: [name] Account Number: [id number] Medical Record Treating RN: Clover Mealy RN, BSN, [place] Sink [id number] Number: Other Clinician: 1921/07/11 (82 y.o. Treating [name], [name] Date of Birth/Sex: Female) Physician/Extender: G Primary Care FULLER, SUSAN Physician: Referring Physician: Meliton Rattan in Treatment: 2 Visit Information History Since Last Visit All ordered tests and consults were completed: No Patient Arrived: Wheel Chair Added or deleted any medications: No Arrival Time: 14:39 Any new allergies or adverse reactions: No Accompanied By: son Had a fall or experienced change in No activities of daily living that may affect Transfer Assistance: None risk of falls: Patient Identification Verified: Yes Signs or symptoms of abuse/neglect since last No Secondary Verification Process Yes visito Completed: Hospitalized since last visit: No Patient Requires Transmission-Based No Has Dressing in Place as Prescribed: Yes Precautions: Pain Present Now: No Patient Has Alerts: No Electronic Signature(s) Signed: 04/08/2016 5:37:17 PM By: Elpidio Eric BSN, RN Entered By: Elpidio Eric on 04/08/2016 14:39:49 Muse, [name] -------------------------------------------------------------------------------- Encounter Discharge Information Details [name] Date of Service: 04/08/2016 2:15 PM Patient Name: [name] Account Number: [id number] Medical Record Treating RN: Clover Mealy RN, BSN, [place] Sink [id number] Number: Other Clinician: February 10, 1921 (82 y.o. Treating [name], [name] Date of Birth/Sex: Female) Physician/Extender: G Primary Care FULLER, SUSAN Physician: Referring Physician: Meliton Rattan in Treatment: 2 Encounter Discharge Information Items Discharge Pain Level: 0 Discharge Condition:  Stable Ambulatory Status: Wheelchair Discharge Destination: Home Transportation: Private Auto Accompanied By: son Schedule Follow-up Appointment: No Medication Reconciliation completed and provided to Patient/Care No [place] Scibilia: Provided on Clinical Summary of Care: 04/08/2016 Form Type Recipient Paper Patient ES Electronic Signature(s) Signed: 04/08/2016 3:11:20 PM By: Gwenlyn Perking Entered By: Gwenlyn Perking on 04/08/2016 15:11:19 Tan, [name] -------------------------------------------------------------------------------- Lower Extremity Assessment Details [name] Date of Service: 04/08/2016 2:15 PM Patient Name: [name] Account Number: [id number] Medical Record Treating RN: Clover Mealy RN, BSN, [place] Sink [id number] Number: Other Clinician: Mar 20, 1921 (82 y.o. Treating [name], [name] Date of Birth/Sex: Female) Physician/Extender: G Primary Care FULLER, SUSAN Physician: Referring Physician: [name] Weeks in Treatment: 2 Vascular Assessment Pulses: Posterior Tibial Dorsalis Pedis Palpable: [Right:Yes] Extremity colors, hair growth, and conditions: Extremity Color: [Right:Mottled] Hair Growth on Extremity: [Right:No] Temperature of Extremity: [Right:Warm] Capillary Refill: [Right:< 3 seconds] Toe Nail Assessment Left: Right: Thick: Yes Discolored: Yes Deformed: Yes Improper Length and Hygiene: Yes Electronic Signature(s) Signed: 04/08/2016 5:37:17 PM By: Elpidio Eric BSN, RN Entered By: Elpidio Eric on 04/08/2016 14:40:25 Kuehnle, [name] -------------------------------------------------------------------------------- Multi Wound Chart Details [name] Date of Service: 04/08/2016 2:15 PM Patient Name: [name] Account Number: [id number] Medical Record Treating RN: Clover Mealy RN, BSN, [place] Sink [id number] Number: Other Clinician: December 19, 1920 (82 y.o. Treating [name], [name] Date of Birth/Sex: Female)  Physician/Extender: G Primary Care FULLER, SUSAN Physician: Referring Physician: [name] Weeks in Treatment: 2 Vital Signs Height(in): 68 Pulse(bpm): 78 Weight(lbs): 118 Blood Pressure 150/53 (mmHg): Body Mass Index(BMI): 18 Temperature(F): 97.8 Respiratory Rate 17 (breaths/min): Photos: [2:No Photos] [3:No Photos] [4:No Photos] Wound Location: [2:Right Lower Leg - Lateral, Proximal] [3:Right Lower Leg - Lateral, Distal] [4:Right Calcaneus - Medial] Wounding Event: [2:Pressure Injury] [3:Pressure Injury] [4:Pressure Injury] Primary Etiology: [2:Pressure Ulcer] [3:Pressure Ulcer] [4:Pressure Ulcer] Comorbid History: [2:Cataracts, Anemia, Hypertension, History of pressure wounds, Osteoarthritis, Dementia] [3:Cataracts, Anemia, Hypertension, History of pressure wounds, Osteoarthritis, Dementia] [4:Cataracts, Anemia, Hypertension, History of pressure  wounds, Osteoarthritis, Dementia] Date Acquired: [2:01/02/2016] [3:01/02/2016] [4:01/02/2016] Weeks of Treatment: [2:2] [  3:2] [4:2] Wound Status: [2:Open] [3:Open] [4:Open] Measurements L x W x D 1.5x0.5x0.54 [3:1.7x0.8x0.4] [4:3x7x0.2] (cm) Area (cm) : [2:0.589] [3:1.068] [4:16.493] Volume (cm) : [2:0.318] [3:0.427] [4:3.299] % Reduction in Area: [2:62.50%] [3:72.80%] [4:-949.80%] % Reduction in Volume: 74.70% [3:89.10%] [4:-2001.30%] Classification: [2:Category/Stage IV] [3:Category/Stage III] [4:Category/Stage II] Exudate Amount: [2:Medium] [3:Medium] [4:Medium] Exudate Type: [2:Serosanguineous] [3:Serosanguineous] [4:Serosanguineous] Exudate Color: [2:red, brown] [3:red, brown] [4:red, brown] Foul Odor After [2:Yes] [3:Yes] [4:No] Cleansing: Odor Anticipated Due to No [3:No] [4:N/A] Product Use: Conaty, Byrd L. ([id number]) Wound Margin: Distinct, outline attached Distinct, outline attached Distinct, outline attached Granulation Amount: Medium (34-66%) Medium (34-66%) Small (1-33%) Granulation Quality: Red Pink,  Pale Red, Pink Necrotic Amount: Small (1-33%) Small (1-33%) Large (67-100%) Exposed Structures: Tendon: Yes Fascia: No Fascia: No Fascia: No Fat: No Fat: No Fat: No Tendon: No Tendon: No Muscle: No Muscle: No Muscle: No Joint: No Joint: No Joint: No Bone: No Bone: No Bone: No Limited to Skin Limited to Skin Breakdown Breakdown Epithelialization: None Small (1-33%) None Periwound Skin Texture: Edema: No Edema: No Edema: No Excoriation: No Excoriation: No Excoriation: No Induration: No Induration: No Induration: No Callus: No Callus: No Callus: No Crepitus: No Crepitus: No Crepitus: No Fluctuance: No Fluctuance: No Fluctuance: No Friable: No Friable: No Friable: No Rash: No Rash: No Rash: No Scarring: No Scarring: No Scarring: No Periwound Skin Moist: Yes Moist: Yes Moist: Yes Moisture: Maceration: No Maceration: No Maceration: No Dry/Scaly: No Dry/Scaly: No Dry/Scaly: No Periwound Skin Color: Atrophie Blanche: No Atrophie Blanche: No Atrophie Blanche: No Cyanosis: No Cyanosis: No Cyanosis: No Ecchymosis: No Ecchymosis: No Ecchymosis: No Erythema: No Erythema: No Erythema: No Hemosiderin Staining: No Hemosiderin Staining: No Hemosiderin Staining: No Mottled: No Mottled: No Mottled: No Pallor: No Pallor: No Pallor: No Rubor: No Rubor: No Rubor: No Temperature: No Abnormality No Abnormality No Abnormality Tenderness on Yes Yes Yes Palpation: Wound Preparation: Ulcer Cleansing: Ulcer Cleansing: Ulcer Cleansing: Rinsed/Irrigated with Rinsed/Irrigated with Rinsed/Irrigated with Saline Saline Saline Topical Anesthetic Topical Anesthetic Topical Anesthetic Applied: Other: lidocaine Applied: Other: lidocaine Applied: Other: Lidocaine 4% 4% 4% Treatment Notes Electronic Signature(s) Signed: 04/08/2016 5:37:17 PM By: Elpidio Eric BSN, RN Entered By: Elpidio Eric on 04/08/2016 15:00:28 TANESHIA, LORENCE ([id number]) RIDHIMA, GOLBERG ([id number]) -------------------------------------------------------------------------------- Multi-Disciplinary Care Plan Details [name] Date of Service: 04/08/2016 2:15 PM Patient Name: [name] Account Number: [id number] Medical Record Treating RN: Clover Mealy RN, BSN, [place] Sink [id number] Number: Other Clinician: 03-03-1921 (82 y.o. Treating [name], [name] Date of Birth/Sex: Female) Physician/Extender: G Primary Care FULLER, SUSAN Physician: Referring Physician: Meliton Rattan in Treatment: 2 Active Inactive Orientation to the Wound Care Program Nursing Diagnoses: Knowledge deficit related to the wound healing center program Goals: Patient/caregiver will verbalize understanding of the Wound Healing Center Program Date Initiated: 03/24/2016 Goal Status: Active Interventions: Provide education on orientation to the wound center Notes: Pressure Nursing Diagnoses: Knowledge deficit related to causes and risk factors for pressure ulcer development Knowledge deficit related to management of pressures ulcers Potential for impaired tissue integrity related to pressure, friction, moisture, and shear Goals: Patient will remain free from development of additional pressure ulcers Date Initiated: 03/24/2016 Goal Status: Active Patient will remain free of pressure ulcers Date Initiated: 03/24/2016 Goal Status: Active Patient/caregiver will verbalize risk factors for pressure ulcer development Date Initiated: 03/24/2016 Goal Status: Active Patient/caregiver will verbalize understanding of pressure ulcer management ALETTE, KATAOKA ([id number]) Date Initiated: 03/24/2016 Goal Status: Active Interventions: Assess: immobility, friction, shearing, incontinence upon admission and as needed Assess offloading  mechanisms upon admission and as needed Assess potential for pressure ulcer upon admission and as needed Provide education on pressure ulcers Treatment Activities: Patient  referred for home evaluation of offloading devices/mattresses : 03/24/2016 Patient referred for pressure reduction/relief devices : 03/24/2016 Patient referred for seating evaluation to ensure proper offloading : 03/24/2016 Pressure reduction/relief device ordered : 03/24/2016 Notes: Wound/Skin Impairment Nursing Diagnoses: Impaired tissue integrity Knowledge deficit related to smoking impact on wound healing Knowledge deficit related to ulceration/compromised skin integrity Goals: Patient/caregiver will verbalize understanding of skin care regimen Date Initiated: 03/24/2016 Goal Status: Active Ulcer/skin breakdown will have a volume reduction of 30% by week 4 Date Initiated: 03/24/2016 Goal Status: Active Ulcer/skin breakdown will have a volume reduction of 50% by week 8 Date Initiated: 03/24/2016 Goal Status: Active Ulcer/skin breakdown will have a volume reduction of 80% by week 12 Date Initiated: 03/24/2016 Goal Status: Active Ulcer/skin breakdown will heal within 14 weeks Date Initiated: 03/24/2016 Goal Status: Active Interventions: Assess patient/caregiver ability to obtain necessary supplies Assess patient/caregiver ability to perform ulcer/skin care regimen upon admission and as needed Assess ulceration(s) every visit Coralee RudSUMMERS, Kairie L. ([id number]) Provide education on ulcer and skin care Treatment Activities: Patient referred to home care : 03/24/2016 Skin care regimen initiated : 03/24/2016 Topical wound management initiated : 03/24/2016 Notes: Electronic Signature(s) Signed: 04/08/2016 5:37:17 PM By: Elpidio EricAfful, Rita BSN, RN Entered By: Elpidio EricAfful, Rita on 04/08/2016 15:00:09 Gombert, [name] ([id number]) -------------------------------------------------------------------------------- Pain Assessment Details [name], Genell Date of Service: 04/08/2016 2:15 PM Patient Name: [name] Account Number: [id number] Medical Record Treating RN: Clover Mealyfful, RN, BSN, [place] SinkRita [id number] Number: Other  Clinician: 1921/02/05 (82 y.o. Treating [name], [name] Date of Birth/Sex: Female) Physician/Extender: G Primary Care FULLER, SUSAN Physician: Referring Physician: Tomi BambergerFULLER, SUSAN Weeks in Treatment: 2 Active Problems Location of Pain Severity and Description of Pain Patient Has Paino Patient Unable to Respond Site Locations Pain Management and Medication Current Pain Management: Electronic Signature(s) Signed: 04/08/2016 5:37:17 PM By: Elpidio EricAfful, Rita BSN, RN Entered By: Elpidio EricAfful, Rita on 04/08/2016 14:39:57 [place], [name] ([id number]) -------------------------------------------------------------------------------- Patient/Caregiver Education Details [name], Florine Date of Service: 04/08/2016 2:15 PM Patient Name: [name] Account Number: [id number] Medical Record Treating RN: Clover Mealyfful, RN, BSN, [place] SinkRita [id number] Number: Other Clinician: 1921/02/05 (82 y.o. Treating [name], [name] Date of Birth/Gender: Female) Physician/Extender: G Primary Care Physician: Meliton RattanFULLER, SUSAN Weeks in Treatment: 2 Referring Physician: Tomi BambergerFULLER, SUSAN Education Assessment Education Provided To: Patient Education Topics Provided Pressure: Methods: Explain/Verbal Responses: State content correctly Welcome To The Wound Care Center: Methods: Explain/Verbal Responses: State content correctly Wound/Skin Impairment: Methods: Explain/Verbal Responses: State content correctly Electronic Signature(s) Signed: 04/08/2016 5:37:17 PM By: Elpidio EricAfful, Rita BSN, RN Entered By: Elpidio EricAfful, Rita on 04/08/2016 15:10:22 Lafever, [name] ([id number]) -------------------------------------------------------------------------------- Wound Assessment Details [name], Myeisha Date of Service: 04/08/2016 2:15 PM Patient Name: [name] Account Number: [id number] Medical Record Treating RN: Clover Mealyfful, RN, BSN, [place] SinkRita [id number] Number: Other Clinician: 1921/02/05 (82 y.o. Treating [name], [name] Date of Birth/Sex: Female)  Physician/Extender: G Primary Care FULLER, SUSAN Physician: Referring Physician: Tomi BambergerFULLER, SUSAN Weeks in Treatment: 2 Wound Status Wound Number: 2 Primary Pressure Ulcer Etiology: Wound Location: Right Lower Leg - Lateral, Proximal Wound Open Status: Wounding Event: Pressure Injury Comorbid Cataracts, Anemia, Hypertension, Date Acquired: 01/02/2016 History: History of pressure wounds, Weeks Of Treatment: 2 Osteoarthritis, Dementia Clustered Wound: No Photos Photo Uploaded By: Elpidio EricAfful, Rita on 04/08/2016 16:54:19 Wound Measurements Length: (cm) 1.5 Width: (cm) 0.5 Depth: (cm) 0.54 Area: (cm) 0.589 Volume: (cm) 0.318 % Reduction in Area: 62.5% % Reduction in  Volume: 74.7% Epithelialization: None Tunneling: No Undermining: No Wound Description Classification: Category/Stage IV Foul Odor Af Wound Margin: Distinct, outline attached Due to Produ Exudate Amount: Medium Exudate Type: Serosanguineous Exudate Color: red, brown ter Cleansing: Yes ct Use: No Wound Bed Jarosz, Janiqua L. ([id number]) Granulation Amount: Medium (34-66%) Exposed Structure Granulation Quality: Red Fascia Exposed: No Necrotic Amount: Small (1-33%) Fat Layer Exposed: No Necrotic Quality: Adherent Slough Tendon Exposed: Yes Muscle Exposed: No Joint Exposed: No Bone Exposed: No Periwound Skin Texture Texture Color No Abnormalities Noted: No No Abnormalities Noted: No Callus: No Atrophie Blanche: No Crepitus: No Cyanosis: No Excoriation: No Ecchymosis: No Fluctuance: No Erythema: No Friable: No Hemosiderin Staining: No Induration: No Mottled: No Localized Edema: No Pallor: No Rash: No Rubor: No Scarring: No Temperature / Pain Moisture Temperature: No Abnormality No Abnormalities Noted: No Tenderness on Palpation: Yes Dry / Scaly: No Maceration: No Moist: Yes Wound Preparation Ulcer Cleansing: Rinsed/Irrigated with Saline Topical Anesthetic Applied: Other: lidocaine  4%, Treatment Notes Wound #2 (Right, Proximal, Lateral Lower Leg) 1. Cleansed with: Clean wound with Normal Saline 4. Dressing Applied: Aquacel Ag 5. Secondary Dressing Applied ABD Pad Kerlix/Conform 7. Secured with Secretary/administrator) Signed: 04/08/2016 5:37:17 PM By: Elpidio Eric BSN, RN Entered By: Elpidio Eric on 04/08/2016 14:58:35 Bibby, [name] ([id number]) COLTON, ENGDAHL ([id number]) -------------------------------------------------------------------------------- Wound Assessment Details [name] Date of Service: 04/08/2016 2:15 PM Patient Name: [name] Account Number: [id number] Medical Record Treating RN: Clover Mealy RN, BSN, [place] Sink [id number] Number: Other Clinician: 1921/05/12 (82 y.o. Treating [name], [name] Date of Birth/Sex: Female) Physician/Extender: G Primary Care FULLER, SUSAN Physician: Referring Physician: [name] Weeks in Treatment: 2 Wound Status Wound Number: 3 Primary Pressure Ulcer Etiology: Wound Location: Right Lower Leg - Lateral, Distal Wound Open Status: Wounding Event: Pressure Injury Comorbid Cataracts, Anemia, Hypertension, Date Acquired: 01/02/2016 History: History of pressure wounds, Weeks Of Treatment: 2 Osteoarthritis, Dementia Clustered Wound: No Photos Photo Uploaded By: Elpidio Eric on 04/08/2016 16:54:19 Wound Measurements Length: (cm) 1.7 Width: (cm) 0.8 Depth: (cm) 0.4 Area: (cm) 1.068 Volume: (cm) 0.427 % Reduction in Area: 72.8% % Reduction in Volume: 89.1% Epithelialization: Small (1-33%) Tunneling: No Undermining: No Wound Description Classification: Category/Stage III Foul Odor Af Wound Margin: Distinct, outline attached Due to Produ Exudate Amount: Medium Exudate Type: Serosanguineous Exudate Color: red, brown ter Cleansing: Yes ct Use: No Wound Bed [place], Perel L. ([id number]) Granulation Amount: Medium (34-66%) Exposed Structure Granulation Quality: Pink,  Pale Fascia Exposed: No Necrotic Amount: Small (1-33%) Fat Layer Exposed: No Necrotic Quality: Adherent Slough Tendon Exposed: No Muscle Exposed: No Joint Exposed: No Bone Exposed: No Limited to Skin Breakdown Periwound Skin Texture Texture Color No Abnormalities Noted: No No Abnormalities Noted: No Callus: No Atrophie Blanche: No Crepitus: No Cyanosis: No Excoriation: No Ecchymosis: No Fluctuance: No Erythema: No Friable: No Hemosiderin Staining: No Induration: No Mottled: No Localized Edema: No Pallor: No Rash: No Rubor: No Scarring: No Temperature / Pain Moisture Temperature: No Abnormality No Abnormalities Noted: No Tenderness on Palpation: Yes Dry / Scaly: No Maceration: No Moist: Yes Wound Preparation Ulcer Cleansing: Rinsed/Irrigated with Saline Topical Anesthetic Applied: Other: lidocaine 4%, Treatment Notes Wound #3 (Right, Distal, Lateral Lower Leg) 1. Cleansed with: Clean wound with Normal Saline 4. Dressing Applied: Aquacel Ag 5. Secondary Dressing Applied ABD Pad Kerlix/Conform 7. Secured with Secretary/administrator) Signed: 04/08/2016 5:37:17 PM By: Elpidio Eric BSN, RN Entered By: Elpidio Eric on 04/08/2016 14:59:38 Terriquez, [name] ([id number]) Clinkscale, [name] -------------------------------------------------------------------------------- Wound Assessment  Details [name] Date of Service: 04/08/2016 2:15 PM Patient Name: [name] Account Number: [id number] Medical Record Treating RN: Clover Mealy RN, BSN, [place] Sink [id number] Number: Other Clinician: 11-12-1920 (82 y.o. Treating [name], [name] Date of Birth/Sex: Female) Physician/Extender: G Primary Care FULLER, SUSAN Physician: Referring Physician: [name] Weeks in Treatment: 2 Wound Status Wound Number: 4 Primary Pressure Ulcer Etiology: Wound Location: Right Calcaneus - Medial Wound Open Wounding Event: Pressure Injury Status: Date  Acquired: 01/02/2016 Comorbid Cataracts, Anemia, Hypertension, Weeks Of Treatment: 2 History: History of pressure wounds, Clustered Wound: No Osteoarthritis, Dementia Photos Photo Uploaded By: Elpidio Eric on 04/08/2016 16:55:09 Wound Measurements Length: (cm) 3 Width: (cm) 7 Depth: (cm) 0.2 Area: (cm) 16.493 Volume: (cm) 3.299 % Reduction in Area: -949.8% % Reduction in Volume: -2001.3% Epithelialization: None Tunneling: No Undermining: No Wound Description Classification: Category/Stage II Wound Margin: Distinct, outline attached Exudate Amount: Medium Exudate Type: Serosanguineous Exudate Color: red, brown Foul Odor After Cleansing: No Wound Bed Nusbaum, Skyleen L. ([id number]) Granulation Amount: Small (1-33%) Exposed Structure Granulation Quality: Red, Pink Fascia Exposed: No Necrotic Amount: Large (67-100%) Fat Layer Exposed: No Necrotic Quality: Adherent Slough Tendon Exposed: No Muscle Exposed: No Joint Exposed: No Bone Exposed: No Limited to Skin Breakdown Periwound Skin Texture Texture Color No Abnormalities Noted: No No Abnormalities Noted: No Callus: No Atrophie Blanche: No Crepitus: No Cyanosis: No Excoriation: No Ecchymosis: No Fluctuance: No Erythema: No Friable: No Hemosiderin Staining: No Induration: No Mottled: No Localized Edema: No Pallor: No Rash: No Rubor: No Scarring: No Temperature / Pain Moisture Temperature: No Abnormality No Abnormalities Noted: No Tenderness on Palpation: Yes Dry / Scaly: No Maceration: No Moist: Yes Wound Preparation Ulcer Cleansing: Rinsed/Irrigated with Saline Topical Anesthetic Applied: Other: Lidocaine 4%, Treatment Notes Wound #4 (Right, Medial Calcaneus) 1. Cleansed with: Clean wound with Normal Saline 4. Dressing Applied: Aquacel Ag 5. Secondary Dressing Applied ABD Pad Kerlix/Conform 7. Secured with Secretary/administrator) Signed: 04/08/2016 5:37:17 PM By: Elpidio Eric BSN,  RN Entered By: Elpidio Eric on 04/08/2016 14:59:59 Prajapati, [name] ([id number]) MELVA, FAUX ([id number]) -------------------------------------------------------------------------------- Vitals Details [name] Date of Service: 04/08/2016 2:15 PM Patient Name: [name] Account Number: [id number] Medical Record Treating RN: Clover Mealy RN, BSN, [place] Sink [id number] Number: Other Clinician: 08/25/1920 (82 y.o. Treating [name], [name] Date of Birth/Sex: Female) Physician/Extender: G Primary Care FULLER, SUSAN Physician: Referring Physician: [name] Weeks in Treatment: 2 Vital Signs Time Taken: 14:42 Temperature (F): 97.8 Height (in): 68 Pulse (bpm): 78 Weight (lbs): 118 Respiratory Rate (breaths/min): 17 Body Mass Index (BMI): 17.9 Blood Pressure (mmHg): 150/53 Reference Range: 80 - 120 mg / dl Electronic Signature(s) Signed: 04/08/2016 5:37:17 PM By: Elpidio Eric BSN, RN Entered By: Elpidio Eric on 04/08/2016 14:43:13

## 2016-04-09 NOTE — Progress Notes (Signed)
[name], [name] ([id number]) Visit Report for 04/08/2016 Chief Complaint Document Details [name], [name] Date of Service: 04/08/2016 2:15 PM Patient Name: [name] Account Number: [id number] Medical Record Treating RN: Clover Mealy RN, BSN, [place] Sink [id number] Number: Other Clinician: 11-28-1920 (82 y.o. Treating Ernestene Coover Date of Birth/Sex: Female) Physician/Extender: G Primary Care FULLER, SUSAN Physician: Referring Physician: Meliton Rattan in Treatment: 2 Information Obtained from: Patient Chief Complaint 03/24/16; patient is here for review wounds on the right lower extremity Electronic Signature(s) Signed: 04/08/2016 4:24:47 PM By: Baltazar Najjar MD Entered By: Baltazar Najjar on 04/08/2016 15:12:33 Mccaskey, [name] -------------------------------------------------------------------------------- Debridement Details [name] Date of Service: 04/08/2016 2:15 PM Patient Name: [name] Account Number: [id number] Medical Record Treating RN: Clover Mealy RN, BSN, [place] Sink [id number] Number: Other Clinician: 07-30-1920 (82 y.o. Treating Zyan Coby Date of Birth/Sex: Female) Physician/Extender: G Primary Care FULLER, SUSAN Physician: Referring Physician: Meliton Rattan in Treatment: 2 Debridement Performed for Wound #4 Right,Medial Calcaneus Assessment: Performed By: Physician [name], MD Debridement: Debridement Pre-procedure Yes - 14:55 Verification/Time Out Taken: Start Time: 14:55 Pain Control: Lidocaine 4% Topical Solution Level: Skin/Subcutaneous Tissue Total Area Debrided (L x 3 (cm) x 7 (cm) = 21 (cm) W): Tissue and other Non-Viable, Exudate, Fibrin/Slough, Subcutaneous material debrided: Instrument: Curette Bleeding: Minimum Hemostasis Achieved: Pressure End Time: 15:00 Procedural Pain: 0 Post Procedural Pain: 0 Response to Treatment: Procedure was tolerated well Post Debridement Measurements of Total  Wound Length: (cm) 3 Stage: Category/Stage II Width: (cm) 7 Depth: (cm) 0.2 Volume: (cm) 3.299 Character of Wound/Ulcer Post Requires Further Debridement: Debridement Severity of Tissue Post Fat layer exposed Debridement: Post Procedure Diagnosis Same as Pre-procedure [name], [name] ([id number]) Electronic Signature(s) Signed: 04/08/2016 4:24:47 PM By: Baltazar Najjar MD Signed: 04/08/2016 5:37:17 PM By: Elpidio Eric BSN, RN Entered By: Baltazar Najjar on 04/08/2016 15:12:15 [name], [name] ([id number]) -------------------------------------------------------------------------------- HPI Details [name] Date of Service: 04/08/2016 2:15 PM Patient Name: [name] Account Number: [id number] Medical Record Treating RN: Clover Mealy RN, BSN,  Sink [id number] Number: Other Clinician: Sep 18, 1920 (82 y.o. Treating [name] Date of Birth/Sex: Female) Physician/Extender: G Primary Care FULLER, SUSAN Physician: Referring Physician: Meliton Rattan in Treatment: 2 History of Present Illness HPI Description: 03/24/16; this is an elderly 82 year old woman with advanced dementia who was hospitalized from 5/8 through 5/13. At that point she had Proteus sepsis felt to be secondary to a UTI the. Acute kidney injury and delirium. Her son who is her primary caregiver says she came home from the hospital with wounds on her right lower extremity and apparently her right buttock as well. There is no mention on the hospital discharge summary of problems. She has been having [facility] and have been using silver alginate based dressings. Apparently the area on the right buttock is healing and the son stated we did not need to become involved with that. In any case the patient has for wounds to on the right heel and 2 on the posterior lateral right calf, the latter of which is not a usual pressure area however that is the history. She is apparently eating and drinking  fairly well. Takes ensure well. She does not have any pressure-relief surface at home. I have reviewed her lab work from the hospital. Her admission albumin was 3.4 on 5/8 04/01/16; patient arrives with wounds looking much the same as last week. She has an extensive pressure area over her right heel and to small but deep wounds on the lateral aspect of her  right lower leg. These wounds are not connected. Although there are advertised his pressure ulcers these 2 wounds are not usual for pressure ulcerations. We have not looked at the pressure ulceration on her buttock at the request of the patient's son who is the primary caregiver 04/08/16; wounds are stable to improved this week. Culture I did of the lower wound on her right lateral leg grew methicillin sensitive staph aureus she is on Septra. We are using silver alginate to all the wounds. Electronic Signature(s) Signed: 04/08/2016 4:24:47 PM By: Baltazar Najjar MD Entered By: Baltazar Najjar on 04/08/2016 15:13:32 Rakes, [name] ([id number]) -------------------------------------------------------------------------------- Physical Exam Details [name] Date of Service: 04/08/2016 2:15 PM Patient Name: [name] Account Number: [id number] Medical Record Treating RN: Clover Mealy RN, BSN, [place] Sink [id number] Number: Other Clinician: 12-27-1920 (82 y.o. Treating Yazleemar Strassner Date of Birth/Sex: Female) Physician/Extender: G Primary Care FULLER, SUSAN Physician: Referring Physician: Tomi [name] Weeks in Treatment: 2 Constitutional Sitting or standing Blood Pressure is within target range for patient.. Pulse regular and within target range for patient.Marland Kitchen Respirations regular, non-labored and within target range.. Patient's appearance is neat and clean. Appears in no acute distress. Well nourished and well developed.. Notes Wound exam; 2 small areas on the right lateral leg which are not connected. She now has a single wound area  on the right heel which required debridement to remove surface slough and nonviable subcutaneous tissue. There is no overt evidence of infection however she is going to start Septra DS for a week today Electronic Signature(s) Signed: 04/08/2016 4:24:47 PM By: Baltazar Najjar MD Entered By: Baltazar Najjar on 04/08/2016 15:15:49 Radloff, [name] -------------------------------------------------------------------------------- Physician Orders Details [name] Date of Service: 04/08/2016 2:15 PM Patient Name: [name] Account Number: [id number] Medical Record Treating RN: Clover Mealy RN, BSN,  Sink [id number] Number: Other Clinician: 09/21/1920 (82 y.o. Treating Jaeson Molstad Date of Birth/Sex: Female) Physician/Extender: G Primary Care FULLER, SUSAN Physician: Referring Physician: Meliton Rattan in Treatment: 2 Verbal / Phone Orders: Yes Clinician: Afful, RN, BSN, Rita Read Back and Verified: Yes Diagnosis Coding Wound Cleansing Wound #2 Right,Proximal,Lateral Lower Leg o Cleanse wound with mild soap and water o May Shower, gently pat wound dry prior to applying new dressing. o May shower with protection. Wound #3 Right,Distal,Lateral Lower Leg o Cleanse wound with mild soap and water o May Shower, gently pat wound dry prior to applying new dressing. o May shower with protection. Wound #4 Right,Medial Calcaneus o Cleanse wound with mild soap and water o May Shower, gently pat wound dry prior to applying new dressing. o May shower with protection. Anesthetic Wound #2 Right,Proximal,Lateral Lower Leg o Topical Lidocaine 4% cream applied to wound bed prior to debridement Wound #3 Right,Distal,Lateral Lower Leg o Topical Lidocaine 4% cream applied to wound bed prior to debridement Wound #4 Right,Medial Calcaneus o Topical Lidocaine 4% cream applied to wound bed prior to debridement Skin Barriers/Peri-Wound Care o Barrier  cream Primary Wound Dressing Wound #2 Right,Proximal,Lateral Lower Leg o Aquacel Ag Wound #3 Right,Distal,Lateral Lower Leg Pomeroy, Lashun L. ([id number]) o Aquacel Ag Wound #4 Right,Medial Calcaneus o Aquacel Ag Secondary Dressing Wound #2 Right,Proximal,Lateral Lower Leg o ABD and Kerlix/Conform Wound #3 Right,Distal,Lateral Lower Leg o ABD and Kerlix/Conform Wound #4 Right,Medial Calcaneus o ABD and Kerlix/Conform Dressing Change Frequency Wound #2 Right,Proximal,Lateral Lower Leg o Change dressing every other day. Wound #3 Right,Distal,Lateral Lower Leg o Change dressing every other day. Wound #4 Right,Medial Calcaneus o Change dressing every other day. Follow-up Appointments  Wound #2 Right,Proximal,Lateral Lower Leg o Return Appointment in 1 week. Wound #3 Right,Distal,Lateral Lower Leg o Return Appointment in 1 week. Wound #4 Right,Medial Calcaneus o Return Appointment in 1 week. Off-Loading Wound #2 Right,Proximal,Lateral Lower Leg o Heel suspension boot to: - SAGE BOOTS o Turn and reposition every 2 hours Wound #3 Right,Distal,Lateral Lower Leg o Heel suspension boot to: - SAGE BOOTS o Turn and reposition every 2 hours Wound #4 Right,Medial Calcaneus o Heel suspension boot to: - SAGE BOOTS o Turn and reposition every 2 hours Dobbins, Laasya L. ([id number]) Additional Orders / Instructions Wound #2 Right,Proximal,Lateral Lower Leg o Increase protein intake. - Ensure, Boost o Other: - VItamin C, A, MVI, Zinc Wound #3 Right,Distal,Lateral Lower Leg o Increase protein intake. - Ensure, Boost o Other: - VItamin C, A, MVI, Zinc Wound #4 Right,Medial Calcaneus o Increase protein intake. - Ensure, Boost o Other: - VItamin C, A, MVI, Zinc Home Health Wound #2 Right,Proximal,Lateral Lower Leg o Continue Home Health Visits - [facility] o Home Health Nurse may visit PRN to address patientos wound care  needs. o FACE TO FACE ENCOUNTER: MEDICARE and MEDICAID PATIENTS: I certify that this patient is under my care and that I had a face-to-face encounter that meets the physician face-to-face encounter requirements with this patient on this date. The encounter with the patient was in whole or in part for the following MEDICAL CONDITION: (primary reason for Home Healthcare) MEDICAL NECESSITY: I certify, that based on my findings, NURSING services are a medically necessary home health service. HOME BOUND STATUS: I certify that my clinical findings support that this patient is homebound (i.e., Due to illness or injury, pt requires aid of supportive devices such as crutches, cane, wheelchairs, walkers, the use of special transportation or the assistance of another person to leave their place of residence. There is a normal inability to leave the home and doing so requires considerable and taxing effort. Other absences are for medical reasons / religious services and are infrequent or of short duration when for other reasons). o If current dressing causes regression in wound condition, may D/C ordered dressing product/s and apply Normal Saline Moist Dressing daily until next Wound Healing Center / Other MD appointment. Notify Wound Healing Center of regression in wound condition at [phone]. o Please direct any NON-WOUND related issues/requests for orders to patient's Primary Care Physician Wound #3 Right,Distal,Lateral Lower Leg o Continue Home Health Visits - [facility] o Home Health Nurse may visit PRN to address patientos wound care needs. o FACE TO FACE ENCOUNTER: MEDICARE and MEDICAID PATIENTS: I certify that this patient is under my care and that I had a face-to-face encounter that meets the physician face-to-face encounter requirements with this patient on this date. The encounter with the patient was in whole or in part for the following MEDICAL CONDITION: (primary  reason for Home Healthcare) MEDICAL NECESSITY: I certify, that based on my findings, NURSING services are a medically necessary home health service. HOME BOUND STATUS: I certify that my clinical findings support that this patient is homebound (i.e., Due to illness or injury, pt requires aid of supportive devices such as crutches, cane, wheelchairs, walkers, the use of special transportation or the assistance of another person to leave their place of residence. There is a normal inability to leave the home and doing so requires considerable and taxing effort. Other AVEREY, KONING ([id number]) absences are for medical reasons / religious services and are infrequent or of short duration  when for other reasons). o If current dressing causes regression in wound condition, may D/C ordered dressing product/s and apply Normal Saline Moist Dressing daily until next Wound Healing Center / Other MD appointment. Notify Wound Healing Center of regression in wound condition at [phone]. o Please direct any NON-WOUND related issues/requests for orders to patient's Primary Care Physician Wound #4 Right,Medial Calcaneus o Continue Home Health Visits - [facility] o Home Health Nurse may visit PRN to address patientos wound care needs. o FACE TO FACE ENCOUNTER: MEDICARE and MEDICAID PATIENTS: I certify that this patient is under my care and that I had a face-to-face encounter that meets the physician face-to-face encounter requirements with this patient on this date. The encounter with the patient was in whole or in part for the following MEDICAL CONDITION: (primary reason for Home Healthcare) MEDICAL NECESSITY: I certify, that based on my findings, NURSING services are a medically necessary home health service. HOME BOUND STATUS: I certify that my clinical findings support that this patient is homebound (i.e., Due to illness or injury, pt requires aid of supportive devices such as  crutches, cane, wheelchairs, walkers, the use of special transportation or the assistance of another person to leave their place of residence. There is a normal inability to leave the home and doing so requires considerable and taxing effort. Other absences are for medical reasons / religious services and are infrequent or of short duration when for other reasons). o If current dressing causes regression in wound condition, may D/C ordered dressing product/s and apply Normal Saline Moist Dressing daily until next Wound Healing Center / Other MD appointment. Notify Wound Healing Center of regression in wound condition at [phone]. o Please direct any NON-WOUND related issues/requests for orders to patient's Primary Care Physician Electronic Signature(s) Signed: 04/08/2016 4:24:47 PM By: Baltazar Najjar MD Signed: 04/08/2016 5:37:17 PM By: Elpidio Eric BSN, RN Entered By: Elpidio Eric on 04/08/2016 15:02:30 Fantini, [name] ([id number]) -------------------------------------------------------------------------------- Problem List Details [name] Date of Service: 04/08/2016 2:15 PM Patient Name: [name] Account Number: [id number] Medical Record Treating RN: Clover Mealy RN, BSN, [place] Sink [id number] Number: Other Clinician: Sep 21, 1920 (82 y.o. 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 Account Number: [id number] Medical Record Treating RN: Clover Mealy RN, BSN, [place] Sink [id number] Number: Other Clinician: 1920/08/05 (82 y.o. Treating Firas Guardado Date of Birth/Sex: Female) Physician/Extender: G Primary Care FULLER, SUSAN Physician: Referring Physician: Meliton Rattan in Treatment: 2 Subjective Chief Complaint Information obtained from Patient 03/24/16; patient is here for review wounds on the right lower extremity History of Present Illness (HPI) 03/24/16; this is an elderly 82 year old woman with advanced dementia who was hospitalized from 5/8 through 5/13. At that point she had Proteus sepsis felt to be secondary to a UTI the. Acute kidney injury and delirium. Her son who is her primary caregiver says she came home from the hospital with wounds on her  right lower extremity and apparently her right buttock as well. There is no mention on the hospital discharge summary of problems. She has been having [facility] and have been using silver alginate based dressings. Apparently the area on the right buttock is healing and the son stated we did not need to become involved with that. In any case the patient has for wounds to on the right heel and 2 on the posterior lateral right calf, the latter of which is not a usual pressure area however that is the history. She is apparently eating and drinking fairly well. Takes ensure well. She does not have any pressure-relief surface at home. I have reviewed her lab work from the hospital. Her admission albumin was 3.4 on 5/8 04/01/16; patient arrives with wounds looking much the same as last week. She has an extensive pressure area over her right heel and to small but deep wounds on the  lateral aspect of her right lower leg. These wounds are not connected. Although there are advertised his pressure ulcers these 2 wounds are not usual for pressure ulcerations. We have not looked at the pressure ulceration on her buttock at the request of the patient's son who is the primary caregiver 04/08/16; wounds are stable to improved this week. Culture I did of the lower wound on her right lateral leg grew methicillin sensitive staph aureus she is on Septra. We are using silver alginate to all the wounds. Objective Constitutional Sitting or standing Blood Pressure is within target range for patient.. Pulse regular and within target range for patient.Marland Kitchen Respirations regular, non-labored and within target range.. Patient's appearance is neat and Maguire, Chandell L. ([id number]) clean. Appears in no acute distress. Well nourished and well developed.. Vitals Time Taken: 2:42 PM, Height: 68 in, Weight: 118 lbs, BMI: 17.9, Temperature: 97.8 F, Pulse: 78 bpm, Respiratory Rate: 17 breaths/min, Blood Pressure: 150/53 mmHg. General Notes: Wound exam; 2 small areas on the right lateral leg which are not connected. She now has a single wound area on the right heel which required debridement to remove surface slough and nonviable subcutaneous tissue. There is no overt evidence of infection however she is going to start Septra DS for a week today Integumentary (Hair, Skin) Wound #2 status is Open. Original cause of wound was Pressure Injury. The wound is located on the Right,Proximal,Lateral Lower Leg. The wound measures 1.5cm length x 0.5cm width x 0.54cm depth; 0.589cm^2 area and 0.318cm^3 volume. There is tendon exposed. There is no tunneling or undermining noted. There is a medium amount of serosanguineous drainage noted. The wound margin is distinct with the outline attached to the wound base. There is medium (34-66%) red granulation within the wound bed. There is a small (1-33%) amount of  necrotic tissue within the wound bed including Adherent Slough. The periwound skin appearance exhibited: Moist. The periwound skin appearance did not exhibit: Callus, Crepitus, Excoriation, Fluctuance, Friable, Induration, Localized Edema, Rash, Scarring, Dry/Scaly, Maceration, Atrophie Blanche, Cyanosis, Ecchymosis, Hemosiderin Staining, Mottled, Pallor, Rubor, Erythema. Periwound temperature was noted as No Abnormality. The periwound has tenderness on palpation. Wound #3 status is Open. Original cause of wound was Pressure Injury. The wound is located on the Right,Distal,Lateral Lower Leg. The wound measures 1.7cm length x 0.8cm width x 0.4cm depth; 1.068cm^2 area and 0.427cm^3 volume. The wound is limited to skin breakdown. There is no tunneling or undermining noted. There is a medium amount of serosanguineous drainage noted. The wound margin is distinct with the outline attached to  the wound base. There is medium (34-66%) pink, pale granulation within the wound bed. There is a small (1-33%) amount of necrotic tissue within the wound bed including Adherent Slough. The periwound skin appearance exhibited: Moist. The periwound skin appearance did not exhibit: Callus, Crepitus, Excoriation, Fluctuance, Friable, Induration, Localized Edema, Rash, Scarring, Dry/Scaly, Maceration, Atrophie Blanche, Cyanosis, Ecchymosis, Hemosiderin Staining, Mottled, Pallor, Rubor, Erythema. Periwound temperature was noted as No Abnormality. The periwound has tenderness on palpation. Wound #4 status is Open. Original cause of wound was Pressure Injury. The wound is located on the Right,Medial Calcaneus. The wound measures 3cm length x 7cm width x 0.2cm depth; 16.493cm^2 area and 3.299cm^3 volume. The wound is limited to skin breakdown. There is no tunneling or undermining noted. There is a medium amount of serosanguineous drainage noted. The wound margin is distinct with the outline attached to the wound base.  There is small (1-33%) red, pink granulation within the wound bed. There is a large (67-100%) amount of necrotic tissue within the wound bed including Adherent Slough. The periwound skin appearance exhibited: Moist. The periwound skin appearance did not exhibit: Callus, Crepitus, Excoriation, Fluctuance, Friable, Induration, Localized Edema, Rash, Scarring, Dry/Scaly, Maceration, Atrophie Blanche, Cyanosis, Ecchymosis, Hemosiderin Staining, Mottled, Pallor, Rubor, Erythema. Periwound temperature was noted as No Abnormality. The periwound has tenderness on palpation. Coralee RudSUMMERS, Rendy L. 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 There was a Skin/Subcutaneous Tissue Debridement ([id number]) debridement with total area of 21 sq cm performed by Maxwell CaulOBSON, Laurynn Mccorvey G, MD. with the following instrument(s): Curette to remove Non-Viable tissue/material including Exudate, Fibrin/Slough, and Subcutaneous after achieving pain control using Lidocaine 4% Topical Solution. A time out was conducted at 14:55, prior to the start of the procedure. A Minimum amount of bleeding was controlled with Pressure. The procedure was tolerated well with a pain level of 0 throughout and a pain level of 0 following the procedure. Post Debridement Measurements: 3cm length x 7cm width x 0.2cm depth; 3.299cm^3 volume. Post debridement Stage noted as Category/Stage II. Character of Wound/Ulcer Post Debridement requires further debridement. Severity of Tissue Post Debridement is: Fat layer exposed. Post procedure Diagnosis Wound #4: Same as Pre-Procedure Plan Wound Cleansing: Wound #2 Right,Proximal,Lateral Lower  Leg: Cleanse wound with mild soap and water May Shower, gently pat wound dry prior to applying new dressing. May shower with protection. Wound #3 Right,Distal,Lateral Lower Leg: Cleanse wound with mild soap and water May Shower, gently pat wound dry prior to applying new dressing. May shower with protection. Wound #4 Right,Medial Calcaneus: Coombes, Lynnae L. ([id number]) Cleanse wound with mild soap and water May Shower, gently pat wound dry prior to applying new dressing. May shower with protection. Anesthetic: Wound #2 Right,Proximal,Lateral Lower Leg: Topical Lidocaine 4% cream applied to wound bed prior to debridement Wound #3 Right,Distal,Lateral Lower Leg: Topical Lidocaine 4% cream applied to wound bed prior to debridement Wound #4 Right,Medial Calcaneus: Topical Lidocaine 4% cream applied to wound bed prior to debridement Skin Barriers/Peri-Wound Care: Barrier cream Primary Wound Dressing: Wound #2 Right,Proximal,Lateral Lower Leg: Aquacel Ag Wound #3 Right,Distal,Lateral Lower Leg: Aquacel Ag Wound #4 Right,Medial Calcaneus: Aquacel Ag Secondary Dressing: Wound #2 Right,Proximal,Lateral Lower Leg: ABD and Kerlix/Conform Wound #3 Right,Distal,Lateral Lower Leg: ABD and Kerlix/Conform Wound #  4 Right,Medial Calcaneus: ABD and Kerlix/Conform Dressing Change Frequency: Wound #2 Right,Proximal,Lateral Lower Leg: Change dressing every other day. Wound #3 Right,Distal,Lateral Lower Leg: Change dressing every other day. Wound #4 Right,Medial Calcaneus: Change dressing every other day. Follow-up Appointments: Wound #2 Right,Proximal,Lateral Lower Leg: Return Appointment in 1 week. Wound #3 Right,Distal,Lateral Lower Leg: Return Appointment in 1 week. Wound #4 Right,Medial Calcaneus: Return Appointment in 1 week. Off-Loading: Wound #2 Right,Proximal,Lateral Lower Leg: Heel suspension boot to: - [employer] Turn and reposition every 2 hours Wound #3  Right,Distal,Lateral Lower Leg: Heel suspension boot to: - SAGE [employer] Turn and reposition every 2 hours Wound #4 Right,Medial Calcaneus: Heel suspension boot to: - SAGE [employer] Turn and reposition every 2 hours Additional Orders / Instructions: SHERBY, MONCAYO ([id number]) Wound #2 Right,Proximal,Lateral Lower Leg: Increase protein intake. - Ensure, Boost Other: - VItamin C, A, MVI, Zinc Wound #3 Right,Distal,Lateral Lower Leg: Increase protein intake. - Ensure, Boost Other: - VItamin C, A, MVI, Zinc Wound #4 Right,Medial Calcaneus: Increase protein intake. - Ensure, Boost Other: - VItamin C, A, MVI, Zinc Home Health: Wound #2 Right,Proximal,Lateral Lower Leg: Continue Home Health Visits - [facility] Home Health Nurse may visit PRN to address patient s wound care needs. FACE TO FACE ENCOUNTER: MEDICARE and MEDICAID PATIENTS: I certify that this patient is under my care and that I had a face-to-face encounter that meets the physician face-to-face encounter requirements with this patient on this date. The encounter with the patient was in whole or in part for the following MEDICAL CONDITION: (primary reason for Home Healthcare) MEDICAL NECESSITY: I certify, that based on my findings, NURSING services are a medically necessary home health service. HOME BOUND STATUS: I certify that my clinical findings support that this patient is homebound (i.e., Due to illness or injury, pt requires aid of supportive devices such as crutches, cane, wheelchairs, walkers, the use of special transportation or the assistance of another person to leave their place of residence. There is a normal inability to leave the home and doing so requires considerable and taxing effort. Other absences are for medical reasons / religious services and are infrequent or of short duration when for other reasons). If current dressing causes regression in wound condition, may D/C ordered dressing product/s and  apply Normal Saline Moist Dressing daily until next Wound Healing Center / Other MD appointment. Notify Wound Healing Center of regression in wound condition at [phone]. Please direct any NON-WOUND related issues/requests for orders to patient's Primary Care Physician Wound #3 Right,Distal,Lateral Lower Leg: Continue Home Health Visits - [facility] Home Health Nurse may visit PRN to address patient s wound care needs. FACE TO FACE ENCOUNTER: MEDICARE and MEDICAID PATIENTS: I certify that this patient is under my care and that I had a face-to-face encounter that meets the physician face-to-face encounter requirements with this patient on this date. The encounter with the patient was in whole or in part for the following MEDICAL CONDITION: (primary reason for Home Healthcare) MEDICAL NECESSITY: I certify, that based on my findings, NURSING services are a medically necessary home health service. HOME BOUND STATUS: I certify that my clinical findings support that this patient is homebound (i.e., Due to illness or injury, pt requires aid of supportive devices such as crutches, cane, wheelchairs, walkers, the use of special transportation or the assistance of another person to leave their place of residence. There is a normal inability to leave the home and doing so requires considerable and taxing effort. Other  absences are for medical reasons / religious services and are infrequent or of short duration when for other reasons). If current dressing causes regression in wound condition, may D/C ordered dressing product/s and apply Normal Saline Moist Dressing daily until next Wound Healing Center / Other MD appointment. Notify Wound Healing Center of regression in wound condition at [phone]. Please direct any NON-WOUND related issues/requests for orders to patient's Primary Care Physician Wound #4 Right,Medial Calcaneus: Continue Home Health Visits - [facility] Health  Nurse may visit PRN to address patient s wound care needs. FACE TO FACE ENCOUNTER: MEDICARE and MEDICAID PATIENTS: I certify that this patient is under my care and that I had a face-to-face encounter that meets the physician face-to-face encounter requirements with this patient on this date. The encounter with the patient was in whole or in part for the following MEDICAL CONDITION: (primary reason for Home Healthcare) MEDICAL NECESSITY: I certify, ADABELLA, STANIS ([id number]) that based on my findings, NURSING services are a medically necessary home health service. HOME BOUND STATUS: I certify that my clinical findings support that this patient is homebound (i.e., Due to illness or injury, pt requires aid of supportive devices such as crutches, cane, wheelchairs, walkers, the use of special transportation or the assistance of another person to leave their place of residence. There is a normal inability to leave the home and doing so requires considerable and taxing effort. Other absences are for medical reasons / religious services and are infrequent or of short duration when for other reasons). If current dressing causes regression in wound condition, may D/C ordered dressing product/s and apply Normal Saline Moist Dressing daily until next Wound Healing Center / Other MD appointment. Notify Wound Healing Center of regression in wound condition at [phone]. Please direct any NON-WOUND related issues/requests for orders to patient's Primary Care Physician #1 I'm going to continue silver alginate to all wound areas, the 2 small areas on the right lateral leg and the heel. She is now getting 7 days of antibiotics. #2 a fairly aggressive debridement to the wound across her heel, if this material was adherent by may need to change the dressing at that point. #3 in general the wounds on her right lateral leg o2 both looks smaller Electronic Signature(s) Signed: 04/08/2016 4:24:47 PM By:  Baltazar Najjar MD Entered By: Baltazar Najjar on 04/08/2016 15:16:48 Meals, [name] ([id number]) -------------------------------------------------------------------------------- SuperBill Details [name] Date of Service: 04/08/2016 Patient Name: [name] Account Number: [id number] Medical Record Treating RN: Clover Mealy RN, BSN, Waldo Sink [id number] Number: Other Clinician: 1921/07/06 (82 y.o. 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 Alzheimer's disease, unspecified Facility Procedures CPT4 Code: [id number] Description: 11042 - DEB SUBQ TISSUE 20 SQ CM/< ICD-10 Description Diagnosis L89.613 Pressure ulcer of right heel, stage 3 Modifier: Quantity: 1 CPT4 Code: [id number] Description: 11045 - DEB SUBQ TISS EA ADDL 20CM ICD-10 Description Diagnosis L89.613 Pressure ulcer of right heel, stage 3 Modifier: Quantity: 1 Physician Procedures CPT4 Code: [id number] Description: 11042 - WC PHYS SUBQ TISS 20 SQ CM ICD-10 Description Diagnosis L89.613 Pressure ulcer of right heel, stage 3 Modifier: Quantity: 1 CPT4 Code: [id number] Galambos, ELIZABE Description: 11045 - WC PHYS SUBQ TISS EA ADDL 20 CM  ICD-10 Description Diagnosis L89.613 Pressure ulcer of right heel, stage 3 TH L. ([id number]) Modifier: Quantity: 1 Electronic Signature(s) Signed: 04/08/2016 4:24:47 PM By: Baltazar Najjar MD Entered By: Baltazar Najjar on 04/08/2016 15:17:24

## 2016-04-15 ENCOUNTER — Encounter: Payer: Medicare Other | Admitting: Internal Medicine

## 2016-04-15 DIAGNOSIS — L89612 Pressure ulcer of right heel, stage 2: Secondary | ICD-10-CM | POA: Diagnosis not present

## 2016-04-22 ENCOUNTER — Encounter: Payer: Medicare Other | Attending: Physician Assistant | Admitting: Physician Assistant

## 2016-04-22 DIAGNOSIS — L89610 Pressure ulcer of right heel, unstageable: Secondary | ICD-10-CM | POA: Diagnosis not present

## 2016-04-22 DIAGNOSIS — G309 Alzheimer's disease, unspecified: Secondary | ICD-10-CM | POA: Insufficient documentation

## 2016-04-22 DIAGNOSIS — L97212 Non-pressure chronic ulcer of right calf with fat layer exposed: Secondary | ICD-10-CM | POA: Insufficient documentation

## 2016-04-22 DIAGNOSIS — I10 Essential (primary) hypertension: Secondary | ICD-10-CM | POA: Diagnosis not present

## 2016-04-22 DIAGNOSIS — M1990 Unspecified osteoarthritis, unspecified site: Secondary | ICD-10-CM | POA: Diagnosis not present

## 2016-04-24 NOTE — Progress Notes (Addendum)
[name], [name] ([id number]) Visit Report for 04/22/2016 Chief Complaint Document Details Patient Name: [name], [name]. Date of Service: 04/22/2016 1:45 PM Medical Record Number: [id number] Patient Account Number: [id number] Date of Birth/Sex: Jul 28, 1920 ([name]) Treating RN: Ashok Cordia, Debi Primary Care Physician: [name] Other Clinician: Referring Physician: [name] Treating Physician/Extender: Linwood Dibbles, HOYT Weeks in Treatment: 4 Information Obtained from: Patient Chief Complaint Follow-up regarding right lateral calf wounds and right heel pressure injury Electronic Signature(s) Signed: 04/23/2016 1:37:32 AM By: Lenda Kelp PA-C Entered By: Lenda Kelp on 04/22/2016 14:39:46 Maring, [name] -------------------------------------------------------------------------------- HPI Details Patient Name: [name] Babe L. Date of Service: 04/22/2016 1:45 PM Medical Record Number: [id number] Patient Account Number: [id number] Date of Birth/Sex: 12-15-1920 ([name]) Treating RN: Ashok Cordia, Debi Primary Care Physician: [name] Other Clinician: Referring Physician: [name] Treating Physician/Extender: Linwood Dibbles, HOYT Weeks in Treatment: 4 History of Present Illness HPI Description: 03/24/16; this is an elderly 82 year old woman with advanced dementia who was hospitalized from 5/8 through 5/13. At that point she had Proteus sepsis felt to be secondary to a UTI the. Acute kidney injury and delirium. Her son who is her primary caregiver says she came home from the hospital with wounds on her right lower extremity and apparently her right buttock as well. There is no mention on the hospital discharge summary of problems. She has been having [facility] and have been using silver alginate based dressings. Apparently the area on the right buttock is healing and the son stated we did not need to become involved with  that. In any case the patient has for wounds to on the right heel and 2 on the posterior lateral right calf, the latter of which is not a usual pressure area however that is the history. She is apparently eating and drinking fairly well. Takes ensure well. She does not have any pressure-relief surface at home. I have reviewed her lab work from the hospital. Her admission albumin was 3.4 on 5/8 04/01/16; patient arrives with wounds looking much the same as last week. She has an extensive pressure area over her right heel and to small but deep wounds on the lateral aspect of her right lower leg. These wounds are not connected. Although there are advertised his pressure ulcers these 2 wounds are not usual for pressure ulcerations. We have not looked at the pressure ulceration on her buttock at the request of the patient's son who is the primary caregiver 04/08/16; wounds are stable to improved this week. Culture I did of the lower wound on her right lateral leg grew methicillin sensitive staph aureus she is on Septra. We are using silver alginate to all the wounds. 04/15/16; the 2 small areas on the lateral aspect of this lady's right leg continued to improve however the heel is once again covered with callus, residual alginate, acrotic material all of which requires a difficult debridement. There continues to be purulent material under this surface. This is in spite of a week of Septra that I gave him for MSSA 04/22/16 Patient today presents for follow-up evaluation. She is seen with her son in the office at this point in time she does have Alzheimer's. The good news is her wounds appeared to be somewhat smaller with current therapy. At this point in time the infection which was cultured previously appears to have improved in regard to the right heel wound. There is no purulent drainage at this point in time and a  good portion of the wound actually is eschar covered and dry with a medial portion  actually open to granulation with very little slough covering. She really has no significant discomfort with palpation manipulation of the wound at this point in time. Electronic Signature(s) Signed: 04/23/2016 1:37:32 AM By: Lenda Kelp PA-C Entered By: Lenda Kelp on 04/22/2016 14:41:50 Ranta, [name] ([id number]) -------------------------------------------------------------------------------- Physical Exam Details Patient Name: [name], [name] Date of Service: 04/22/2016 1:45 PM Medical Record Number: [id number] Patient Account Number: [id number] Date of Birth/Sex: 17-Apr-1921 ([name]) Treating RN: Ashok Cordia, Debi Primary Care Physician: [name] Other Clinician: Referring Physician: [name] Treating Physician/Extender: STONE III, HOYT Weeks in Treatment: 4 Constitutional chronically ill appearing but in no apparent distress. Respiratory normal breathing without difficulty. clear to auscultation bilaterally. Cardiovascular regular rate and rhythm with normal S1, S2. Psychiatric Patient is not able to cooperate in decision making regarding care. Patient has dementia. patient is confused. Electronic Signature(s) Signed: 04/23/2016 1:37:32 AM By: Lenda Kelp PA-C Entered By: Lenda Kelp on 04/22/2016 14:43:12 Jerez, [name] ([id number]) -------------------------------------------------------------------------------- Physician Orders Details Patient Name: [name] Babe L. Date of Service: 04/22/2016 1:45 PM Medical Record Number: [id number] Patient Account Number: [id number] Date of Birth/Sex: Feb 25, 1921 ([name]) Treating RN: [name] Primary Care Physician: [name] Other Clinician: Referring Physician: [name] Treating Physician/Extender: Lenda Kelp Weeks in Treatment: 4 Verbal / Phone Orders: Yes Clinician: [name] Read Back and Verified: Yes Diagnosis Coding Wound Cleansing Wound #2  Right,Proximal,Lateral Lower Leg o Cleanse wound with mild soap and water o May Shower, gently pat wound dry prior to applying new dressing. o May shower with protection. Wound #3 Right,Distal,Lateral Lower Leg o Cleanse wound with mild soap and water o May Shower, gently pat wound dry prior to applying new dressing. o May shower with protection. Wound #4 Right,Medial Calcaneus o Cleanse wound with mild soap and water o May Shower, gently pat wound dry prior to applying new dressing. o May shower with protection. Anesthetic Wound #2 Right,Proximal,Lateral Lower Leg o Topical Lidocaine 4% cream applied to wound bed prior to debridement - for clinic use Wound #3 Right,Distal,Lateral Lower Leg o Topical Lidocaine 4% cream applied to wound bed prior to debridement - for clinic use Wound #4 Right,Medial Calcaneus o Topical Lidocaine 4% cream applied to wound bed prior to debridement - for clinic use Skin Barriers/Peri-Wound Care Wound #4 Right,Medial Calcaneus o Barrier cream Primary Wound Dressing Wound #2 Right,Proximal,Lateral Lower Leg o Aquacel Ag Wound #3 Right,Distal,Lateral Lower Leg o Aquacel Ag Broaddus, Shalane L. ([id number]) Wound #4 Right,Medial Calcaneus o Aquacel Ag Secondary Dressing Wound #2 Right,Proximal,Lateral Lower Leg o ABD and Kerlix/Conform Wound #3 Right,Distal,Lateral Lower Leg o ABD and Kerlix/Conform Wound #4 Right,Medial Calcaneus o ABD and Kerlix/Conform - tape and stretch netting #4 o Dry Gauze Dressing Change Frequency Wound #2 Right,Proximal,Lateral Lower Leg o Change dressing every other day. Wound #3 Right,Distal,Lateral Lower Leg o Change dressing every other day. Wound #4 Right,Medial Calcaneus o Change dressing every other day. Follow-up Appointments Wound #2 Right,Proximal,Lateral Lower Leg o Return Appointment in 1 week. Wound #3 Right,Distal,Lateral Lower Leg o Return Appointment  in 1 week. Wound #4 Right,Medial Calcaneus o Return Appointment in 1 week. Off-Loading Wound #2 Right,Proximal,Lateral Lower Leg o Heel suspension boot to: - SAGE BOOTS o Turn and reposition every 2 hours Wound #3 Right,Distal,Lateral Lower Leg o Heel suspension boot to: - SAGE BOOTS o Turn and reposition every 2 hours Wound #  4 Right,Medial Calcaneus o Heel suspension boot to: - SAGE BOOTS o Turn and reposition every 2 hours Petre, Camyla L. ([id number]) Additional Orders / Instructions Wound #2 Right,Proximal,Lateral Lower Leg o Increase protein intake. - Ensure, Boost o Other: - VItamin C, A, MVI, Zinc Wound #3 Right,Distal,Lateral Lower Leg o Increase protein intake. - Ensure, Boost o Other: - VItamin C, A, MVI, Zinc Wound #4 Right,Medial Calcaneus o Increase protein intake. - Ensure, Boost o Other: - VItamin C, A, MVI, Zinc Home Health Wound #2 Right,Proximal,Lateral Lower Leg o Continue Home Health Visits - [facility] o Home Health Nurse may visit PRN to address patientos wound care needs. o FACE TO FACE ENCOUNTER: MEDICARE and MEDICAID PATIENTS: I certify that this patient is under my care and that I had a face-to-face encounter that meets the physician face-to-face encounter requirements with this patient on this date. The encounter with the patient was in whole or in part for the following MEDICAL CONDITION: (primary reason for Home Healthcare) MEDICAL NECESSITY: I certify, that based on my findings, NURSING services are a medically necessary home health service. HOME BOUND STATUS: I certify that my clinical findings support that this patient is homebound (i.e., Due to illness or injury, pt requires aid of supportive devices such as crutches, cane, wheelchairs, walkers, the use of special transportation or the assistance of another person to leave their place of residence. There is a normal inability to leave the home and doing  so requires considerable and taxing effort. Other absences are for medical reasons / religious services and are infrequent or of short duration when for other reasons). o If current dressing causes regression in wound condition, may D/C ordered dressing product/s and apply Normal Saline Moist Dressing daily until next Wound Healing Center / Other MD appointment. Notify Wound Healing Center of regression in wound condition at [phone]. o Please direct any NON-WOUND related issues/requests for orders to patient's Primary Care Physician Wound #3 Right,Distal,Lateral Lower Leg o Continue Home Health Visits - [facility] o Home Health Nurse may visit PRN to address patientos wound care needs. o FACE TO FACE ENCOUNTER: MEDICARE and MEDICAID PATIENTS: I certify that this patient is under my care and that I had a face-to-face encounter that meets the physician face-to-face encounter requirements with this patient on this date. The encounter with the patient was in whole or in part for the following MEDICAL CONDITION: (primary reason for Home Healthcare) MEDICAL NECESSITY: I certify, that based on my findings, NURSING services are a medically necessary home health service. HOME BOUND STATUS: I certify that my clinical findings support that this patient is homebound (i.e., Due to illness or injury, pt requires aid of supportive devices such as crutches, cane, wheelchairs, walkers, the use of special transportation or the assistance of another person to leave their place of residence. There is a normal inability to leave the home and doing so requires considerable and taxing effort. Other GERALDINA, PARROTT ([id number]) absences are for medical reasons / religious services and are infrequent or of short duration when for other reasons). o If current dressing causes regression in wound condition, may D/C ordered dressing product/s and apply Normal Saline Moist Dressing daily  until next Wound Healing Center / Other MD appointment. Notify Wound Healing Center of regression in wound condition at [phone]. o Please direct any NON-WOUND related issues/requests for orders to patient's Primary Care Physician Wound #4 Right,Medial Calcaneus o Continue Home Health Visits - [facility] o Home  Health Nurse may visit PRN to address patientos wound care needs. o FACE TO FACE ENCOUNTER: MEDICARE and MEDICAID PATIENTS: I certify that this patient is under my care and that I had a face-to-face encounter that meets the physician face-to-face encounter requirements with this patient on this date. The encounter with the patient was in whole or in part for the following MEDICAL CONDITION: (primary reason for Home Healthcare) MEDICAL NECESSITY: I certify, that based on my findings, NURSING services are a medically necessary home health service. HOME BOUND STATUS: I certify that my clinical findings support that this patient is homebound (i.e., Due to illness or injury, pt requires aid of supportive devices such as crutches, cane, wheelchairs, walkers, the use of special transportation or the assistance of another person to leave their place of residence. There is a normal inability to leave the home and doing so requires considerable and taxing effort. Other absences are for medical reasons / religious services and are infrequent or of short duration when for other reasons). o If current dressing causes regression in wound condition, may D/C ordered dressing product/s and apply Normal Saline Moist Dressing daily until next Wound Healing Center / Other MD appointment. Notify Wound Healing Center of regression in wound condition at [phone]. o Please direct any NON-WOUND related issues/requests for orders to patient's Primary Care Physician Medications-please add to medication list. Wound #2 Right,Proximal,Lateral Lower Leg o P.O. Antibiotics -  Cefadroxil Wound #3 Right,Distal,Lateral Lower Leg o P.O. Antibiotics - Cefadroxil Wound #4 Right,Medial Calcaneus o P.O. Antibiotics - Cefadroxil Electronic Signature(s) Signed: 04/23/2016 1:37:32 AM By: Lenda KelpStone III, Hoyt PA-C Signed: 04/23/2016 4:28:52 PM By: Elliot GurneyWoody, RN, BSN, Kim RN, BSN Entered By: Elliot GurneyWoody, RN, BSN, Kim on 04/22/2016 14:34:00 Jaco, [name] ([id number]) -------------------------------------------------------------------------------- Problem List Details Patient Name: [name] BabeSUMMERS, [name] Date of Service: 04/22/2016 1:45 PM Medical Record Number: [id number] Patient Account Number: [id number] Date of Birth/Sex: 03/08/1921 (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 ([id number]) -------------------------------------------------------------------------------- Progress Note Details Patient Name: Finkbiner, [name] Date of Service: 04/22/2016 1:45 PM Medical Record Number: [id number] Patient Account Number: [id number] Date of Birth/Sex: 03/08/1921 ([name]) Treating RN: Ashok CordiaPinkerton, Debi Primary Care Physician: Tomi BambergerFULLER, SUSAN Other Clinician: Referring Physician: Tomi BambergerFULLER, SUSAN Treating Physician/Extender: Linwood DibblesSTONE III, HOYT Weeks in  Treatment: 4 Subjective Chief Complaint Information obtained from Patient Follow-up regarding right lateral calf wounds and right heel pressure injury History of Present Illness (HPI) 03/24/16; this is an elderly 82 year old woman with advanced dementia who was hospitalized from 5/8 through 5/13. At that point she had Proteus sepsis felt to be secondary to a UTI the. Acute kidney injury and delirium. Her son who is her primary caregiver says she came home from the hospital with wounds on her right lower extremity and apparently her right buttock as well. There is no mention on the hospital discharge summary of problems. She has been having [facility] and have been using  silver alginate based dressings. Apparently the area on the right buttock is healing and the son stated we did not need to become involved with that. In any case the patient has for wounds to on the right heel and 2 on the posterior lateral right calf, the latter of which is not a usual pressure area however that is the history. She is apparently eating and drinking fairly well. Takes ensure well. She does not have any pressure-relief surface at home. I have reviewed her lab work from the hospital. Her admission albumin was 3.4 on 5/8 04/01/16; patient arrives with wounds looking much the same as last week. She has an extensive pressure area over her right heel and to small but deep wounds on the lateral aspect of her right lower leg. These wounds are not connected. Although there are advertised his pressure ulcers these 2 wounds are not usual for pressure ulcerations. We have not looked at the pressure ulceration on her buttock at the request of the patient's son who is the primary caregiver 04/08/16; wounds are stable to improved this week. Culture I did of the lower wound on her right lateral leg grew methicillin sensitive staph aureus she is on Septra. We are using silver alginate to all the wounds. 04/15/16; the 2  small areas on the lateral aspect of this lady's right leg continued to improve however the heel is once again covered with callus, residual alginate, acrotic material all of which requires a difficult debridement. There continues to be purulent material under this surface. This is in spite of a week of Septra that I gave him for MSSA 04/22/16 Patient today presents for follow-up evaluation. She is seen with her son in the office at this point in time she does have Alzheimer's. The good news is her wounds appeared to be somewhat smaller with current therapy. At this point in time the infection which was cultured previously appears to have improved in regard to the right heel wound. There is no purulent drainage at this point in time and a good portion of the wound actually is eschar covered and dry with a medial portion actually open to granulation with very little slough covering. She really has no significant discomfort with palpation manipulation of the wound at this point in time. REATHER, STELLER L. ([id number]) Objective Constitutional chronically ill appearing but in no apparent distress. Vitals Time Taken: 2:02 PM, Height: 68 in, Weight: 118 lbs, BMI: 17.9, Temperature: 97.6 F, Pulse: 87 bpm, Respiratory Rate: 16 breaths/min, Blood Pressure: 143/78 mmHg. Respiratory normal breathing without difficulty. clear to auscultation bilaterally. Cardiovascular regular rate and rhythm with normal S1, S2. Psychiatric Patient is not able to cooperate in decision making regarding care. Patient has dementia. patient is confused. Integumentary (Hair, Skin) Wound #2 status is Open. Original cause of wound was Pressure Injury. The wound is located on the Right,Proximal,Lateral Lower Leg. The wound measures 0.4cm length x 0.2cm width x 0.1cm depth; 0.063cm^2 area and 0.006cm^3 volume. Wound #3 status is Open. Original cause of wound was Pressure Injury. The wound is located on  the Right,Distal,Lateral Lower Leg. The wound measures 1cm length x 0.2cm width x 0.1cm depth; 0.157cm^2 area and 0.016cm^3 volume. Wound #4 status is Open. Original cause of wound was Pressure Injury. The wound is located on the Right,Medial Calcaneus. The wound measures 2.5cm length x 2.5cm width x 0.2cm depth; 4.909cm^2 area and 0.982cm^3 volume. Assessment Active Problems ICD-10 L89.610 - Pressure ulcer of right heel, unstageable L97.212 - Non-pressure chronic  ulcer of right calf with fat layer exposed G30.9 - Alzheimer's disease, unspecified Stetler, Courtlyn L. ([id number]) Diagnoses ICD-10 L89.610: Pressure ulcer of right heel, unstageable L97.212: Non-pressure chronic ulcer of right calf with fat layer exposed G30.9: Alzheimer's disease, unspecified Plan Wound Cleansing: Wound #2 Right,Proximal,Lateral Lower Leg: Cleanse wound with mild soap and water May Shower, gently pat wound dry prior to applying new dressing. May shower with protection. Wound #3 Right,Distal,Lateral Lower Leg: Cleanse wound with mild soap and water May Shower, gently pat wound dry prior to applying new dressing. May shower with protection. Wound #4 Right,Medial Calcaneus: Cleanse wound with mild soap and water May Shower, gently pat wound dry prior to applying new dressing. May shower with protection. Anesthetic: Wound #2 Right,Proximal,Lateral Lower Leg: Topical Lidocaine 4% cream applied to wound bed prior to debridement - for clinic use Wound #3 Right,Distal,Lateral Lower Leg: Topical Lidocaine 4% cream applied to wound bed prior to debridement - for clinic use Wound #4 Right,Medial Calcaneus: Topical Lidocaine 4% cream applied to wound bed prior to debridement - for clinic use Skin Barriers/Peri-Wound Care: Wound #4 Right,Medial Calcaneus: Barrier cream Primary Wound Dressing: Wound #2 Right,Proximal,Lateral Lower Leg: Aquacel Ag Wound #3 Right,Distal,Lateral Lower Leg: Aquacel  Ag Wound #4 Right,Medial Calcaneus: Aquacel Ag Secondary Dressing: Wound #2 Right,Proximal,Lateral Lower Leg: ABD and Kerlix/Conform Wound #3 Right,Distal,Lateral Lower Leg: ABD and Kerlix/Conform Wound #4 Right,Medial Calcaneus: ABD and Kerlix/Conform - tape and stretch netting #4 Dry Gauze Inabinet, Shakima L. ([id number]) Dressing Change Frequency: Wound #2 Right,Proximal,Lateral Lower Leg: Change dressing every other day. Wound #3 Right,Distal,Lateral Lower Leg: Change dressing every other day. Wound #4 Right,Medial Calcaneus: Change dressing every other day. Follow-up Appointments: Wound #2 Right,Proximal,Lateral Lower Leg: Return Appointment in 1 week. Wound #3 Right,Distal,Lateral Lower Leg: Return Appointment in 1 week. Wound #4 Right,Medial Calcaneus: Return Appointment in 1 week. Off-Loading: Wound #2 Right,Proximal,Lateral Lower Leg: Heel suspension boot to: - SAGE BOOTS Turn and reposition every 2 hours Wound #3 Right,Distal,Lateral Lower Leg: Heel suspension boot to: - SAGE BOOTS Turn and reposition every 2 hours Wound #4 Right,Medial Calcaneus: Heel suspension boot to: - SAGE BOOTS Turn and reposition every 2 hours Additional Orders / Instructions: Wound #2 Right,Proximal,Lateral Lower Leg: Increase protein intake. - Ensure, Boost Other: - VItamin C, A, MVI, Zinc Wound #3 Right,Distal,Lateral Lower Leg: Increase protein intake. - Ensure, Boost Other: - VItamin C, A, MVI, Zinc Wound #4 Right,Medial Calcaneus: Increase protein intake. - Ensure, Boost Other: - VItamin C, A, MVI, Zinc Home Health: Wound #2 Right,Proximal,Lateral Lower Leg: Continue Home Health Visits - [facility] Home Health Nurse may visit PRN to address patient s wound care needs. FACE TO FACE ENCOUNTER: MEDICARE and MEDICAID PATIENTS: I certify that this patient is under my care and that I had a face-to-face encounter that meets the physician face-to-face  encounter requirements with this patient on this date. The encounter with the patient was in whole or in part for the following MEDICAL CONDITION: (primary reason for Home Healthcare) MEDICAL NECESSITY: I certify, that based on my findings, NURSING services are a medically necessary home health service. HOME BOUND STATUS: I certify that my clinical findings support that this patient is homebound (i.e., Due to illness or injury, pt requires aid of supportive devices such as crutches, cane, wheelchairs, walkers, the use of special transportation or the assistance of another person to leave their place of residence. There is a normal inability to leave the home and doing so requires considerable and taxing effort.  Other absences are for medical reasons / religious services and are infrequent or of short duration when for other reasons). If current dressing causes regression in wound condition, may D/C ordered dressing product/s and apply Normal Saline Moist Dressing daily until next Wound Healing Center / Other MD appointment. Notify Wound Healing Center of regression in wound condition at [phone]. CEANNA, WAREING ([id number]) Please direct any NON-WOUND related issues/requests for orders to patient's Primary Care Physician Wound #3 Right,Distal,Lateral Lower Leg: Continue Home Health Visits - [facility] Home Health Nurse may visit PRN to address patient s wound care needs. FACE TO FACE ENCOUNTER: MEDICARE and MEDICAID PATIENTS: I certify that this patient is under my care and that I had a face-to-face encounter that meets the physician face-to-face encounter requirements with this patient on this date. The encounter with the patient was in whole or in part for the following MEDICAL CONDITION: (primary reason for Home Healthcare) MEDICAL NECESSITY: I certify, that based on my findings, NURSING services are a medically necessary home health service. HOME BOUND STATUS: I certify  that my clinical findings support that this patient is homebound (i.e., Due to illness or injury, pt requires aid of supportive devices such as crutches, cane, wheelchairs, walkers, the use of special transportation or the assistance of another person to leave their place of residence. There is a normal inability to leave the home and doing so requires considerable and taxing effort. Other absences are for medical reasons / religious services and are infrequent or of short duration when for other reasons). If current dressing causes regression in wound condition, may D/C ordered dressing product/s and apply Normal Saline Moist Dressing daily until next Wound Healing Center / Other MD appointment. Notify Wound Healing Center of regression in wound condition at [phone]. Please direct any NON-WOUND related issues/requests for orders to patient's Primary Care Physician Wound #4 Right,Medial Calcaneus: Continue Home Health Visits - [facility] Home Health Nurse may visit PRN to address patient s wound care needs. FACE TO FACE ENCOUNTER: MEDICARE and MEDICAID PATIENTS: I certify that this patient is under my care and that I had a face-to-face encounter that meets the physician face-to-face encounter requirements with this patient on this date. The encounter with the patient was in whole or in part for the following MEDICAL CONDITION: (primary reason for Home Healthcare) MEDICAL NECESSITY: I certify, that based on my findings, NURSING services are a medically necessary home health service. HOME BOUND STATUS: I certify that my clinical findings support that this patient is homebound (i.e., Due to illness or injury, pt requires aid of supportive devices such as crutches, cane, wheelchairs, walkers, the use of special transportation or the assistance of another person to leave their place of residence. There is a normal inability to leave the home and doing so requires considerable and taxing  effort. Other absences are for medical reasons / religious services and are infrequent or of short duration when for other reasons). If current dressing causes regression in wound condition, may D/C ordered dressing product/s and apply Normal Saline Moist Dressing daily until next Wound Healing Center / Other MD appointment. Notify Wound Healing Center of regression in wound condition at [phone]. Please direct any NON-WOUND related issues/requests for orders to patient's Primary Care Physician Medications-please add to medication list.: Wound #2 Right,Proximal,Lateral Lower Leg: P.O. Antibiotics - Cefadroxil Wound #3 Right,Distal,Lateral Lower Leg: P.O. Antibiotics - Cefadroxil Wound #4 Right,Medial Calcaneus: P.O. Antibiotics - Cefadroxil Follow-Up Appointments: A Patient Clinical Summary of  Care was provided to ES CYNTHYA, YAM. ([id number]) On evaluation today the right lateral calf wounds appear to be doing fairly well. I was able just with cleansing the wound with a wet-to-dry calls able to remove any adherent slough at this point in time. The same is true of the medial aspect of patient's right heel wound. She tolerated all this without any complication today and only minimal discomfort based on her response. I'm going recommend that we continue at this point in time with the Aquacel Ag to all wounds. I'm also recommending a heel cup and for her to continue to utilize the Prevalon boot in order to offload pressure on this right heel. Patient's son is in agreement. I also recommend continuing with offloading in regard to returning in repositioning and minimum every 2 hours in the skilled nursing facility. All this was discussed with patient's son as well during the office visit today and all questions and concerns were answered and encouraged to the best my ability. We will see her back in one week. Electronic Signature(s) Signed: 05/13/2016 9:03:37 PM By: Lenda Kelp  PA-C Previous Signature: 04/23/2016 1:37:32 AM Version By: Lenda Kelp PA-C Entered By: Lenda Kelp on 05/13/2016 21:03:37 Wandel, [name] -------------------------------------------------------------------------------- SuperBill Details Patient Name: [name] Babe L. Date of Service: 04/22/2016 Medical Record Number: [id number] Patient Account Number: [id number] Date of Birth/Sex: 1921/01/15 ([name]) Treating RN: Ashok Cordia, Debi Primary Care Physician: [name] Other Clinician: Referring Physician: [name] Treating Physician/Extender: Linwood Dibbles, HOYT Weeks in Treatment: 4 Diagnosis Coding ICD-10 Codes Code Description L89.610 Pressure ulcer of right heel, unstageable L97.212 Non-pressure chronic ulcer of right calf with fat layer exposed G30.9 Alzheimer's disease, unspecified Facility Procedures CPT4 Code: 62952841 Description: 99213 - WOUND CARE VISIT-LEV 3 EST PT Modifier: Quantity: 1 Physician Procedures CPT4 Code: 3244010 Description: 99213 - WC PHYS LEVEL 3 - EST PT ICD-10 Description Diagnosis L89.610 Pressure ulcer of right heel, unstageable L97.212 Non-pressure chronic ulcer of right calf with fat G30.9 Alzheimer's disease, unspecified Modifier: layer exposed Quantity: 1 Electronic Signature(s) Signed: 04/23/2016 1:37:32 AM By: Lenda Kelp PA-C Entered By: Lenda Kelp on 04/22/2016 14:47:01

## 2016-04-24 NOTE — Progress Notes (Signed)
[name], SCHENDEL ([id number]) Visit Report for 04/15/2016 Chief Complaint Document Details [name], LOPATA Date of Service: 04/15/2016 1:30 PM Patient Name: [name] Account Number: [id number] Medical Record Treating RN: [name] [id number] Number: Other Clinician: 1921-07-03 (82 y.o. Treating [name], [name] Date of Birth/Sex: Female) Physician/Extender: G Primary Care FULLER, SUSAN Physician: Referring Physician: Meliton Rattan in Treatment: 3 Information Obtained from: Patient Chief Complaint 03/24/16; patient is here for review wounds on the right lower extremity Electronic Signature(s) Signed: 04/24/2016 8:10:29 AM By: Baltazar Najjar MD Entered By: Baltazar Najjar on 04/15/2016 14:44:54 Mcclish, [name] -------------------------------------------------------------------------------- Debridement Details [name] Date of Service: 04/15/2016 1:30 PM Patient Name: [name] Account Number: [id number] Medical Record Treating RN: [name] [id number] Number: Other Clinician: 1921-04-12 (82 y.o. Treating [name], [name] Date of Birth/Sex: Female) Physician/Extender: G Primary Care FULLER, SUSAN Physician: Referring Physician: [name] Weeks in Treatment: 3 Debridement Performed for Wound #4 Right,Medial Calcaneus Assessment: Performed By: Physician [name], MD Debridement: Debridement Pre-procedure Yes - 13:54 Verification/Time Out Taken: Start Time: 13:55 Pain Control: Lidocaine 4% Topical Solution Level: Skin/Subcutaneous Tissue Total Area Debrided (L x 3 (cm) x 7 (cm) = 21 (cm) W): Tissue and other Viable, Non-Viable, Exudate, Fibrin/Slough, Subcutaneous material debrided: Instrument: Curette Bleeding: Moderate Hemostasis Achieved: Pressure End Time: 14:00 Procedural Pain: 0 Post Procedural Pain: 0 Response to Treatment: Procedure was tolerated well Post Debridement Measurements of Total  Wound Length: (cm) 3 Stage: Category/Stage II Width: (cm) 7 Depth: (cm) 0.2 Volume: (cm) 3.299 Character of Wound/Ulcer Post Stable Debridement: Severity of Tissue Post Fat layer exposed Debridement: Post Procedure Diagnosis Same as Pre-procedure [name], REINERTSEN ([id number]) Electronic Signature(s) Signed: 04/15/2016 5:55:37 PM By: [name] Signed: 04/24/2016 8:10:29 AM By: Baltazar Najjar MD Entered By: Baltazar Najjar on 04/15/2016 14:44:40 Rohm, [name] ([id number]) -------------------------------------------------------------------------------- HPI Details [name] Date of Service: 04/15/2016 1:30 PM Patient Name: [name] Account Number: [id number] Medical Record Treating RN: [name] [id number] Number: Other Clinician: 05/31/1921 (82 y.o. Treating [name], [name] Date of Birth/Sex: Female) Physician/Extender: G Primary Care FULLER, SUSAN Physician: Referring Physician: Meliton Rattan in Treatment: 3 History of Present Illness HPI Description: 03/24/16; this is an elderly 82 year old woman with advanced dementia who was hospitalized from 5/8 through 5/13. At that point she had Proteus sepsis felt to be secondary to a UTI the. Acute kidney injury and delirium. Her son who is her primary caregiver says she came home from the hospital with wounds on her right lower extremity and apparently her right buttock as well. There is no mention on the hospital discharge summary of problems. She has been having [facility] and have been using silver alginate based dressings. Apparently the area on the right buttock is healing and the son stated we did not need to become involved with that. In any case the patient has for wounds to on the right heel and 2 on the posterior lateral right calf, the latter of which is not a usual pressure area however that is the history. She is apparently eating and drinking fairly well. Takes ensure  well. She does not have any pressure-relief surface at home. I have reviewed her lab work from the hospital. Her admission albumin was 3.4 on 5/8 04/01/16; patient arrives with wounds looking much the same as last week. She has an extensive pressure area over her right heel and to small but deep wounds on the lateral aspect of her right lower leg. These wounds are not connected. Although  there are advertised his pressure ulcers these 2 wounds are not usual for pressure ulcerations. We have not looked at the pressure ulceration on her buttock at the request of the patient's son who is the primary caregiver 04/08/16; wounds are stable to improved this week. Culture I did of the lower wound on her right lateral leg grew methicillin sensitive staph aureus she is on Septra. We are using silver alginate to all the wounds. 04/15/16; the 2 small areas on the lateral aspect of this lady's right leg continued to improve however the heel is once again covered with callus, residual alginate, acrotic material all of which requires a difficult debridement. There continues to be purulent material under this surface. This is in spite of a week of Septra that I gave him for MSSA Electronic Signature(s) Signed: 04/24/2016 8:10:29 AM By: Baltazar Najjar MD Entered By: Baltazar Najjar on 04/15/2016 14:46:06 Henricksen, [name] ([id number]) -------------------------------------------------------------------------------- Physical Exam Details [name] Date of Service: 04/15/2016 1:30 PM Patient Name: [name] Account Number: [id number] Medical Record Treating RN: [name] [id number] Number: Other Clinician: 1920/11/26 (82 y.o. Treating [name], [name] Date of Birth/Sex: Female) Physician/Extender: G Primary Care FULLER, SUSAN Physician: Referring Physician: [name] Weeks in Treatment: 3 Constitutional Sitting or standing Blood Pressure is within target range for patient.. Pulse regular  and within target range for patient.Marland Kitchen Respirations regular, non-labored and within target range.. Temperature is normal and within the target range for the patient.. Patient's appearance is neat and clean. Appears in no acute distress. Well nourished and well developed.. Notes Wound exam; the 2 small areas on the right lateral leg appear improved. The right heel wound is a horizontal long wound which requires aggressive debridement. Purulent material noted Electronic Signature(s) Signed: 04/24/2016 8:10:29 AM By: Baltazar Najjar MD Entered By: Baltazar Najjar on 04/15/2016 14:47:49 Dignan, [name] -------------------------------------------------------------------------------- Physician Orders Details [name] Date of Service: 04/15/2016 1:30 PM Patient Name: [name] Account Number: [id number] Medical Record Treating RN: [name] [id number] Number: Other Clinician: 05-20-1921 (82 y.o. Treating [name], [name] Date of Birth/Sex: Female) Physician/Extender: G Primary Care FULLER, SUSAN Physician: Referring Physician: Meliton Rattan in Treatment: 3 Verbal / Phone Orders: Yes Clinician: Pinkerton, Debi Read Back and Verified: Yes Diagnosis Coding Wound Cleansing Wound #2 Right,Proximal,Lateral Lower Leg o Cleanse wound with mild soap and water o May Shower, gently pat wound dry prior to applying new dressing. o May shower with protection. Wound #3 Right,Distal,Lateral Lower Leg o Cleanse wound with mild soap and water o May Shower, gently pat wound dry prior to applying new dressing. o May shower with protection. Wound #4 Right,Medial Calcaneus o Cleanse wound with mild soap and water o May Shower, gently pat wound dry prior to applying new dressing. o May shower with protection. Anesthetic Wound #2 Right,Proximal,Lateral Lower Leg o Topical Lidocaine 4% cream applied to wound bed prior to debridement - for clinic  use Wound #3 Right,Distal,Lateral Lower Leg o Topical Lidocaine 4% cream applied to wound bed prior to debridement - for clinic use Wound #4 Right,Medial Calcaneus o Topical Lidocaine 4% cream applied to wound bed prior to debridement - for clinic use Skin Barriers/Peri-Wound Care Wound #4 Right,Medial Calcaneus o Barrier cream Primary Wound Dressing Wound #2 Right,Proximal,Lateral Lower Leg o Aquacel Ag [place], Kalena L. ([id number]) Wound #3 Right,Distal,Lateral Lower Leg o Aquacel Ag Wound #4 Right,Medial Calcaneus o Aquacel Ag Secondary Dressing Wound #2 Right,Proximal,Lateral Lower Leg o ABD and Kerlix/Conform Wound #3 Right,Distal,Lateral Lower Leg o ABD  and Kerlix/Conform Wound #4 Right,Medial Calcaneus o ABD and Kerlix/Conform - tape and stretch netting #4 o Dry Gauze Dressing Change Frequency Wound #2 Right,Proximal,Lateral Lower Leg o Change dressing every other day. Wound #3 Right,Distal,Lateral Lower Leg o Change dressing every other day. Wound #4 Right,Medial Calcaneus o Change dressing every other day. Follow-up Appointments Wound #2 Right,Proximal,Lateral Lower Leg o Return Appointment in 1 week. Wound #3 Right,Distal,Lateral Lower Leg o Return Appointment in 1 week. Wound #4 Right,Medial Calcaneus o Return Appointment in 1 week. Off-Loading Wound #2 Right,Proximal,Lateral Lower Leg o Heel suspension boot to: - SAGE BOOTS o Turn and reposition every 2 hours Wound #3 Right,Distal,Lateral Lower Leg o Heel suspension boot to: - SAGE BOOTS o Turn and reposition every 2 hours Wound #4 Right,Medial Calcaneus Noseworthy, Nekisha L. ([id number]) o Heel suspension boot to: - SAGE BOOTS o Turn and reposition every 2 hours Additional Orders / Instructions Wound #2 Right,Proximal,Lateral Lower Leg o Increase protein intake. - Ensure, Boost o Other: - VItamin C, A, MVI, Zinc Wound #3 Right,Distal,Lateral Lower  Leg o Increase protein intake. - Ensure, Boost o Other: - VItamin C, A, MVI, Zinc Wound #4 Right,Medial Calcaneus o Increase protein intake. - Ensure, Boost o Other: - VItamin C, A, MVI, Zinc Home Health Wound #2 Right,Proximal,Lateral Lower Leg o Continue Home Health Visits - [facility] o Home Health Nurse may visit PRN to address patientos wound care needs. o FACE TO FACE ENCOUNTER: MEDICARE and MEDICAID PATIENTS: I certify that this patient is under my care and that I had a face-to-face encounter that meets the physician face-to-face encounter requirements with this patient on this date. The encounter with the patient was in whole or in part for the following MEDICAL CONDITION: (primary reason for Home Healthcare) MEDICAL NECESSITY: I certify, that based on my findings, NURSING services are a medically necessary home health service. HOME BOUND STATUS: I certify that my clinical findings support that this patient is homebound (i.e., Due to illness or injury, pt requires aid of supportive devices such as crutches, cane, wheelchairs, walkers, the use of special transportation or the assistance of another person to leave their place of residence. There is a normal inability to leave the home and doing so requires considerable and taxing effort. Other absences are for medical reasons / religious services and are infrequent or of short duration when for other reasons). o If current dressing causes regression in wound condition, may D/C ordered dressing product/s and apply Normal Saline Moist Dressing daily until next Wound Healing Center / Other MD appointment. Notify Wound Healing Center of regression in wound condition at [phone]. o Please direct any NON-WOUND related issues/requests for orders to patient's Primary Care Physician Wound #3 Right,Distal,Lateral Lower Leg o Continue Home Health Visits - [facility] o Home Health Nurse may visit PRN  to address patientos wound care needs. o FACE TO FACE ENCOUNTER: MEDICARE and MEDICAID PATIENTS: I certify that this patient is under my care and that I had a face-to-face encounter that meets the physician face-to-face encounter requirements with this patient on this date. The encounter with the patient was in whole or in part for the following MEDICAL CONDITION: (primary reason for Home Healthcare) MEDICAL NECESSITY: I certify, that based on my findings, NURSING services are a medically necessary home health service. HOME BOUND STATUS: I certify that my clinical findings support that this patient is homebound (i.e., Due to illness or injury, pt requires aid of supportive devices such as crutches, cane,  wheelchairs, walkers, the use of special Baumgarner, Armida L. ([id number]) transportation or the assistance of another person to leave their place of residence. There is a normal inability to leave the home and doing so requires considerable and taxing effort. Other absences are for medical reasons / religious services and are infrequent or of short duration when for other reasons). o If current dressing causes regression in wound condition, may D/C ordered dressing product/s and apply Normal Saline Moist Dressing daily until next Wound Healing Center / Other MD appointment. Notify Wound Healing Center of regression in wound condition at [phone]. o Please direct any NON-WOUND related issues/requests for orders to patient's Primary Care Physician Wound #4 Right,Medial Calcaneus o Continue Home Health Visits - [facility] o Home Health Nurse may visit PRN to address patientos wound care needs. o FACE TO FACE ENCOUNTER: MEDICARE and MEDICAID PATIENTS: I certify that this patient is under my care and that I had a face-to-face encounter that meets the physician face-to-face encounter requirements with this patient on this date. The encounter with the patient was in whole  or in part for the following MEDICAL CONDITION: (primary reason for Home Healthcare) MEDICAL NECESSITY: I certify, that based on my findings, NURSING services are a medically necessary home health service. HOME BOUND STATUS: I certify that my clinical findings support that this patient is homebound (i.e., Due to illness or injury, pt requires aid of supportive devices such as crutches, cane, wheelchairs, walkers, the use of special transportation or the assistance of another person to leave their place of residence. There is a normal inability to leave the home and doing so requires considerable and taxing effort. Other absences are for medical reasons / religious services and are infrequent or of short duration when for other reasons). o If current dressing causes regression in wound condition, may D/C ordered dressing product/s and apply Normal Saline Moist Dressing daily until next Wound Healing Center / Other MD appointment. Notify Wound Healing Center of regression in wound condition at [phone]. o Please direct any NON-WOUND related issues/requests for orders to patient's Primary Care Physician Medications-please add to medication list. Wound #2 Right,Proximal,Lateral Lower Leg o P.O. Antibiotics - Cefadroxil Wound #3 Right,Distal,Lateral Lower Leg o P.O. Antibiotics - Cefadroxil Wound #4 Right,Medial Calcaneus o P.O. Antibiotics - Cefadroxil Electronic Signature(s) Signed: 04/15/2016 5:55:37 PM By: [name] Signed: 04/24/2016 8:10:29 AM By: Baltazar Najjar MD Entered By: [name] on 04/15/2016 14:05:45 Augspurger, [name] ([id number]) -------------------------------------------------------------------------------- Problem List Details [name] Date of Service: 04/15/2016 1:30 PM Patient Name: [name] Account Number: [id number] Medical Record Treating RN: [name] [id number] Number: Other Clinician: Mar 11, 1921 (82 y.o. 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 Account Number: [id number] Medical Record Treating RN: [name] [id number] Number: Other Clinician: [birth year] (82 y.o. Treating [name], [name] Date of Birth/Sex: Female) Physician/Extender: G Primary Care FULLER, SUSAN Physician: Referring Physician: Toni Arthurs,  SUSAN Weeks in Treatment: 3 Subjective Chief Complaint Information obtained from Patient 03/24/16; patient is here for review wounds on the right lower extremity History of Present Illness (HPI) 03/24/16; this is an elderly 82 year old woman with advanced dementia who was hospitalized from 5/8 through 5/13. At that point she had Proteus sepsis felt to be secondary to a UTI the. Acute kidney injury and delirium. Her son who is her primary caregiver says she came home from the hospital with wounds on her right lower extremity and apparently her right buttock as well. There is no mention on the hospital discharge summary of problems. She has been having [facility] and have been using silver alginate based dressings. Apparently the area on the right  buttock is healing and the son stated we did not need to become involved with that. In any case the patient has for wounds to on the right heel and 2 on the posterior lateral right calf, the latter of which is not a usual pressure area however that is the history. She is apparently eating and drinking fairly well. Takes ensure well. She does not have any pressure-relief surface at home. I have reviewed her lab work from the hospital. Her admission albumin was 3.4 on 5/8 04/01/16; patient arrives with wounds looking much the same as last week. She has an extensive pressure area over her right heel and to small but deep wounds on the lateral aspect of her right lower leg. These wounds are not connected. Although there are advertised his pressure ulcers these 2 wounds are not usual for pressure ulcerations. We have not looked at the pressure ulceration on her buttock at the request of the patient's son who is the primary caregiver 04/08/16; wounds are stable to improved this week. Culture I did of the lower wound on her right lateral leg grew methicillin sensitive staph aureus she is on Septra. We are using silver alginate to all the wounds. 04/15/16; the 2 small areas on the lateral aspect of this lady's right leg continued to improve however the heel is once again covered with callus, residual alginate, acrotic material all of which requires a difficult debridement. There continues to be purulent material under this surface. This is in spite of a week of Septra that I gave him for MSSA Objective Moler, Adaia L. ([id number]) Constitutional Sitting or standing Blood Pressure is within target range for patient.. Pulse regular and within target range for patient.Marland Kitchen Respirations regular, non-labored and within target range.. Temperature is normal and within the target range for the patient.. Patient's appearance is neat and clean. Appears in no acute distress. Well nourished and well  developed.. Vitals Time Taken: 1:37 PM, Height: 68 in, Weight: 118 lbs, BMI: 17.9, Pulse: 79 bpm, Respiratory Rate: 16 breaths/min, Blood Pressure: 128/78 mmHg. General Notes: Pt refused to have her temperature taken. General Notes: Wound exam; the 2 small areas on the right lateral leg appear improved. The right heel wound is a horizontal long wound which requires aggressive debridement. Purulent material noted Integumentary (Hair, Skin) Wound #2 status is Open. Original cause of wound was Pressure Injury. The wound is located on the Right,Proximal,Lateral Lower Leg. The wound measures 1cm length x 0.5cm width x 0.4cm depth; 0.393cm^2 area and 0.157cm^3 volume. There is tendon exposed. There is no tunneling or undermining noted. There is a large amount of serosanguineous drainage noted. The wound margin is distinct with the outline attached to the wound base. There is medium (34-66%) red granulation within the  wound bed. There is a medium (34-66%) amount of necrotic tissue within the wound bed including Adherent Slough. The periwound skin appearance exhibited: Moist. The periwound skin appearance did not exhibit: Callus, Crepitus, Excoriation, Fluctuance, Friable, Induration, Localized Edema, Rash, Scarring, Dry/Scaly, Maceration, Atrophie Blanche, Cyanosis, Ecchymosis, Hemosiderin Staining, Mottled, Pallor, Rubor, [name]. Periwound temperature was noted as No Abnormality. The periwound has tenderness on palpation. Wound #3 status is Open. Original cause of wound was Pressure Injury. The wound is located on the Right,Distal,Lateral Lower Leg. The wound measures 1.3cm length x 0.6cm width x 0.3cm depth; 0.613cm^2 area and 0.184cm^3 volume. The wound is limited to skin breakdown. There is no tunneling noted, however, there is undermining starting at 11:00 and ending at 7:00 with a maximum distance of 0.3cm. There is a medium amount of serosanguineous drainage noted. The wound margin is  distinct with the outline attached to the wound base. There is medium (34-66%) pink, pale granulation within the wound bed. There is a medium (34-66%) amount of necrotic tissue within the wound bed including Adherent Slough. The periwound skin appearance exhibited: Moist. The periwound skin appearance did not exhibit: Callus, Crepitus, Excoriation, Fluctuance, Friable, Induration, Localized Edema, Rash, Scarring, Dry/Scaly, Maceration, Atrophie Blanche, Cyanosis, Ecchymosis, Hemosiderin Staining, Mottled, Pallor, Rubor, [name]. Periwound temperature was noted as No Abnormality. The periwound has tenderness on palpation. Wound #4 status is Open. Original cause of wound was Pressure Injury. The wound is located on the Right,Medial Calcaneus. The wound measures 3cm length x 7cm width x 0.2cm depth; 16.493cm^2 area and 3.299cm^3 volume. The wound is limited to skin breakdown. There is no tunneling or undermining noted. There is a medium amount of serosanguineous drainage noted. The wound margin is distinct with the outline attached to the wound base. There is small (1-33%) red, pink granulation within the wound bed. There is a large (67-100%) amount of necrotic tissue within the wound bed including Adherent Slough. The periwound skin appearance exhibited: Moist. The periwound skin appearance did not exhibit: Callus, Crepitus, Excoriation, Fluctuance, Friable, Induration, Localized Edema, Rash, Scarring, Dry/Scaly, Maceration, Atrophie Blanche, Cyanosis, Ecchymosis, Hemosiderin Staining, Mottled, Pallor, Rubor, Esters, [name] ([id number]) [name]. Periwound temperature was noted as No Abnormality. The periwound has tenderness on palpation. 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 There was a Skin/Subcutaneous Tissue Debridement ([id number]) debridement with total area of 21 sq cm performed by [name], MD. with the following instrument(s): Curette to remove Viable and Non-Viable tissue/material including Exudate, Fibrin/Slough, and Subcutaneous after achieving pain control using Lidocaine 4% Topical Solution. A time out was conducted at 13:54, prior to the start of the procedure. A Moderate amount of bleeding was controlled with Pressure. The procedure was tolerated well with a pain level of 0 throughout and a pain level of 0 following the procedure. Post Debridement Measurements: 3cm length x 7cm width x 0.2cm depth; 3.299cm^3 volume. Post debridement Stage noted as Category/Stage II. Character of Wound/Ulcer Post Debridement is stable. Severity of Tissue Post Debridement is: Fat layer exposed. Post procedure Diagnosis Wound #4: Same as Pre-Procedure Plan Wound Cleansing: Wound #2 Right,Proximal,Lateral Lower Leg: Cleanse wound with mild soap and water May Shower, gently pat wound dry prior to applying new dressing. May shower with protection.  Wound #3 Right,Distal,Lateral Lower Leg: Garron, [name] ([id number]) Cleanse wound with mild soap and water May Shower, gently pat wound dry prior to applying new dressing. May shower with protection. Wound #4 Right,Medial Calcaneus: Cleanse wound with mild soap and water May Shower, gently pat wound dry prior to applying new dressing. May shower with protection. Anesthetic: Wound #2 Right,Proximal,Lateral Lower Leg: Topical Lidocaine 4% cream applied to wound bed prior to debridement - for clinic use Wound #3 Right,Distal,Lateral Lower Leg: Topical Lidocaine 4% cream applied to wound bed prior to debridement - for clinic use Wound #4 Right,Medial Calcaneus: Topical Lidocaine 4% cream applied to wound  bed prior to debridement - for clinic use Skin Barriers/Peri-Wound Care: Wound #4 Right,Medial Calcaneus: Barrier cream Primary Wound Dressing: Wound #2 Right,Proximal,Lateral Lower Leg: Aquacel Ag Wound #3 Right,Distal,Lateral Lower Leg: Aquacel Ag Wound #4 Right,Medial Calcaneus: Aquacel Ag Secondary Dressing: Wound #2 Right,Proximal,Lateral Lower Leg: ABD and Kerlix/Conform Wound #3 Right,Distal,Lateral Lower Leg: ABD and Kerlix/Conform Wound #4 Right,Medial Calcaneus: ABD and Kerlix/Conform - tape and stretch netting #4 Dry Gauze Dressing Change Frequency: Wound #2 Right,Proximal,Lateral Lower Leg: Change dressing every other day. Wound #3 Right,Distal,Lateral Lower Leg: Change dressing every other day. Wound #4 Right,Medial Calcaneus: Change dressing every other day. Follow-up Appointments: Wound #2 Right,Proximal,Lateral Lower Leg: Return Appointment in 1 week. Wound #3 Right,Distal,Lateral Lower Leg: Return Appointment in 1 week. Wound #4 Right,Medial Calcaneus: Return Appointment in 1 week. Off-Loading: Wound #2 Right,Proximal,Lateral Lower Leg: Heel suspension boot to: - [employer] Turn and reposition every 2 hours Wound #3 Right,Distal,Lateral Lower Leg: Etsitty, Noreta L. ([id number]) Heel suspension boot to: - SAGE [employer]S Turn and reposition every 2 hours Wound #4 Right,Medial Calcaneus: Heel suspension boot to: - SAGE [employer] Turn and reposition every 2 hours Additional Orders / Instructions: Wound #2 Right,Proximal,Lateral Lower Leg: Increase protein intake. - Ensure, Boost Other: - VItamin C, A, MVI, Zinc Wound #3 Right,Distal,Lateral Lower Leg: Increase protein intake. - Ensure, Boost Other: - VItamin C, A, MVI, Zinc Wound #4 Right,Medial Calcaneus: Increase protein intake. - Ensure, Boost Other: - VItamin C, A, MVI, Zinc Home Health: Wound #2 Right,Proximal,Lateral Lower Leg: Continue Home Health Visits - [facility] Home Health Nurse  may visit PRN to address patient s wound care needs. FACE TO FACE ENCOUNTER: MEDICARE and MEDICAID PATIENTS: I certify that this patient is under my care and that I had a face-to-face encounter that meets the physician face-to-face encounter requirements with this patient on this date. The encounter with the patient was in whole or in part for the following MEDICAL CONDITION: (primary reason for Home Healthcare) MEDICAL NECESSITY: I certify, that based on my findings, NURSING services are a medically necessary home health service. HOME BOUND STATUS: I certify that my clinical findings support that this patient is homebound (i.e., Due to illness or injury, pt requires aid of supportive devices such as crutches, cane, wheelchairs, walkers, the use of special transportation or the assistance of another person to leave their place of residence. There is a normal inability to leave the home and doing so requires considerable and taxing effort. Other absences are for medical reasons / religious services and are infrequent or of short duration when for other reasons). If current dressing causes regression in wound condition, may D/C ordered dressing product/s and apply Normal Saline Moist Dressing daily until next Wound Healing Center / Other MD appointment. Notify Wound Healing Center of regression in wound condition at [phone]. Please direct any NON-WOUND related issues/requests for  orders to patient's Primary Care Physician Wound #3 Right,Distal,Lateral Lower Leg: Continue Home Health Visits - [facility] Home Health Nurse may visit PRN to address patient s wound care needs. FACE TO FACE ENCOUNTER: MEDICARE and MEDICAID PATIENTS: I certify that this patient is under my care and that I had a face-to-face encounter that meets the physician face-to-face encounter requirements with this patient on this date. The encounter with the patient was in whole or in part for the following MEDICAL  CONDITION: (primary reason for Home Healthcare) MEDICAL NECESSITY: I certify, that based on my findings, NURSING services are a medically necessary home health service. HOME BOUND STATUS: I certify that my clinical findings support that this patient is homebound (i.e., Due to illness or injury, pt requires aid of supportive devices such as crutches, cane, wheelchairs, walkers, the use of special transportation or the assistance of another person to leave their place of residence. There is a normal inability to leave the home and doing so requires considerable and taxing effort. Other absences are for medical reasons / religious services and are infrequent or of short duration when for other reasons). If current dressing causes regression in wound condition, may D/C ordered dressing product/s and apply Normal Saline Moist Dressing daily until next Wound Healing Center / Other MD appointment. Notify Wound Healing Center of regression in wound condition at [phone]. Please direct any NON-WOUND related issues/requests for orders to patient's Primary Care Physician Wound #4 Right,Medial Calcaneus: [name], [name] ([id number]) Continue Home Health Visits - [facility] Home Health Nurse may visit PRN to address patient s wound care needs. FACE TO FACE ENCOUNTER: MEDICARE and MEDICAID PATIENTS: I certify that this patient is under my care and that I had a face-to-face encounter that meets the physician face-to-face encounter requirements with this patient on this date. The encounter with the patient was in whole or in part for the following MEDICAL CONDITION: (primary reason for Home Healthcare) MEDICAL NECESSITY: I certify, that based on my findings, NURSING services are a medically necessary home health service. HOME BOUND STATUS: I certify that my clinical findings support that this patient is homebound (i.e., Due to illness or injury, pt requires aid of supportive devices such as  crutches, cane, wheelchairs, walkers, the use of special transportation or the assistance of another person to leave their place of residence. There is a normal inability to leave the home and doing so requires considerable and taxing effort. Other absences are for medical reasons / religious services and are infrequent or of short duration when for other reasons). If current dressing causes regression in wound condition, may D/C ordered dressing product/s and apply Normal Saline Moist Dressing daily until next Wound Healing Center / Other MD appointment. Notify Wound Healing Center of regression in wound condition at [phone]. Please direct any NON-WOUND related issues/requests for orders to patient's Primary Care Physician Medications-please add to medication list.: Wound #2 Right,Proximal,Lateral Lower Leg: P.O. Antibiotics - Cefadroxil Wound #3 Right,Distal,Lateral Lower Leg: P.O. Antibiotics - Cefadroxil Wound #4 Right,Medial Calcaneus: P.O. Antibiotics - Cefadroxil #1 I'm any continue with the silver alginate based dressings to all areas #2 oral cefadroxil 250/525ml tid for a futher week #3 the 2 small areas on the right lateral leg are improved however the heel area is not. After aggressive debridement the base of the wound doesn't appear to unhealthy nor is there a lot of depth. Electronic Signature(s) Signed: 04/24/2016 8:10:29 AM By: Baltazar Najjarobson, [name] MD Entered By: Baltazar Najjarobson, [name] on  04/15/2016 14:51:56 [name], [name] ([id number]) -------------------------------------------------------------------------------- SuperBill Details [name] Date of Service: 04/15/2016 Patient Name: [name] Account Number: [id number] Medical Record Treating RN: [name] [id number] Number: Other Clinician: April 29, 1921 (82 y.o. 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 Alzheimer's disease, unspecified Facility Procedures CPT4 Code: [id number] Description: 11042 - DEB SUBQ TISSUE 20 SQ CM/< ICD-10 Description Diagnosis L89.613 Pressure ulcer of right heel, stage 3 Modifier: Quantity: 1 CPT4 Code: [id number] Description: 11045 - DEB SUBQ TISS EA ADDL 20CM ICD-10 Description Diagnosis L89.613 Pressure ulcer of right heel, stage 3 Modifier: Quantity: 1 Physician Procedures CPT4 Code: [id number] Description: 11042 - WC PHYS SUBQ TISS 20 SQ CM ICD-10 Description Diagnosis L89.613 Pressure ulcer of right heel, stage 3 Modifier: Quantity: 1 CPT4 Code: [id number] Greear, ELIZABE Description: 11045 - WC PHYS SUBQ TISS EA ADDL 20 CM ICD-10 Description Diagnosis L89.613 Pressure ulcer of right heel, stage 3 TH L. ([id number]) Modifier: Quantity: 1 Electronic Signature(s) Signed: 04/24/2016 8:10:29 AM By: Baltazar Najjar MD Entered By: Baltazar Najjar on 04/15/2016 14:52:27

## 2016-04-24 NOTE — Progress Notes (Signed)
[name], [name] ([id number]) Visit Report for 04/22/2016 Arrival Information Details Patient Name: [name], [name]. Date of Service: 04/22/2016 1:45 PM Medical Record Number: [id number] Patient Account Number: [id number] Date of Birth/Sex: August 03, 1920 ([name]) Treating RN: [name] Primary Care Physician: [name] Other Clinician: Referring Physician: Tomi [name] Treating Physician/Extender: [name], [name] Weeks in Treatment: 4 Visit Information History Since Last Visit Added or deleted any medications: No Patient Arrived: Wheel Chair Any new allergies or adverse No reactions: Arrival Time: 13:57 Had a fall or experienced change in No Accompanied By: son activities of daily living that may Transfer Assistance: Manual affect Patient Identification Verified: Yes risk of falls: Secondary Verification Process Yes Signs or symptoms of abuse/neglect No Completed: since last visito Patient Requires Transmission-Based No Hospitalized since last visit: No Precautions: Pain Present Now: Unable to Patient Has Alerts: No Respond Electronic Signature(s) Signed: 04/23/2016 4:28:52 PM By: Elliot Gurney, RN, BSN, Kim RN, BSN Entered By: Elliot Gurney, RN, BSN, Kim on 04/22/2016 14:02:17 [name], [name] Seats ([id number]) -------------------------------------------------------------------------------- Clinic Level of Care Assessment Details Patient Name: [name], [name] Date of Service: 04/22/2016 1:45 PM Medical Record Number: [id number] Patient Account Number: [id number] Date of Birth/Sex: [birth year] ([name]) Treating RN: [name] Primary Care Physician: [name] Other Clinician: Referring Physician: Tomi [name] Treating Physician/Extender: [name], [name] Weeks in Treatment: 4 Clinic Level of Care Assessment Items TOOL 4 Quantity Score []  - Use when only an EandM is performed on FOLLOW-UP visit 0 ASSESSMENTS - Nursing Assessment / Reassessment []  -  Reassessment of Co-morbidities (includes updates in patient status) 0 X - Reassessment of Adherence to Treatment Plan 1 5 ASSESSMENTS - Wound and Skin Assessment / Reassessment []  - Simple Wound Assessment / Reassessment - one wound 0 X - Complex Wound Assessment / Reassessment - multiple wounds 3 5 []  - Dermatologic / Skin Assessment (not related to wound area) 0 ASSESSMENTS - Focused Assessment []  - Circumferential Edema Measurements - multi extremities 0 []  - Nutritional Assessment / Counseling / Intervention 0 []  - Lower Extremity Assessment (monofilament, tuning fork, pulses) 0 []  - Peripheral Arterial Disease Assessment (using hand held doppler) 0 ASSESSMENTS - Ostomy and/or Continence Assessment and Care []  - Incontinence Assessment and Management 0 []  - Ostomy Care Assessment and Management (repouching, etc.) 0 PROCESS - Coordination of Care X - Simple Patient / Family Education for ongoing care 1 15 []  - Complex (extensive) Patient / Family Education for ongoing care 0 X - Staff obtains Chiropractor, Records, Test Results / Process Orders 1 10 []  - Staff telephones HHA, Nursing Homes / Clarify orders / etc 0 []  - Routine Transfer to another Facility (non-emergent condition) 0 [name], [name] ([id number]) []  - Routine Hospital Admission (non-emergent condition) 0 []  - New Admissions / Manufacturing engineer / Ordering NPWT, Apligraf, etc. 0 []  - Emergency Hospital Admission (emergent condition) 0 X - Simple Discharge Coordination 1 10 []  - Complex (extensive) Discharge Coordination 0 PROCESS - Special Needs []  - Pediatric / Minor Patient Management 0 []  - Isolation Patient Management 0 []  - Hearing / Language / Visual special needs 0 []  - Assessment of Community assistance (transportation, D/C planning, etc.) 0 []  - Additional assistance / Altered mentation 0 []  - Support Surface(s) Assessment (bed, cushion, seat, etc.) 0 INTERVENTIONS - Wound Cleansing / Measurement []  -  Simple Wound Cleansing - one wound 0 X - Complex Wound Cleansing - multiple wounds 3 5 X - Wound Imaging (photographs - any number of wounds) 1  5 []  - Wound Tracing (instead of photographs) 0 []  - Simple Wound Measurement - one wound 0 X - Complex Wound Measurement - multiple wounds 3 5 INTERVENTIONS - Wound Dressings []  - Small Wound Dressing one or multiple wounds 0 []  - Medium Wound Dressing one or multiple wounds 0 X - Large Wound Dressing one or multiple wounds 1 20 []  - Application of Medications - topical 0 []  - Application of Medications - injection 0 INTERVENTIONS - Miscellaneous []  - External ear exam 0 [name], [name] ([id number]) []  - Specimen Collection (cultures, biopsies, blood, body fluids, etc.) 0 []  - Specimen(s) / Culture(s) sent or taken to Lab for analysis 0 []  - Patient Transfer (multiple staff / Nurse, adultHoyer Lift / Similar devices) 0 []  - Simple Staple / Suture removal (25 or less) 0 []  - Complex Staple / Suture removal (26 or more) 0 []  - Hypo / Hyperglycemic Management (close monitor of Blood Glucose) 0 []  - Ankle / Brachial Index (ABI) - do not check if billed separately 0 X - Vital Signs 1 5 Has the patient been seen at the hospital within the last three years: Yes Total Score: 115 Level Of Care: New/Established - Level 3 Electronic Signature(s) Signed: 04/23/2016 4:28:52 PM By: Elliot GurneyWoody, RN, BSN, Kim RN, BSN Entered By: Elliot GurneyWoody, RN, BSN, Kim on 04/22/2016 14:39:18 [name], [name] SeatsELIZABETH L. ([id number]) -------------------------------------------------------------------------------- Encounter Discharge Information Details Patient Name: [name], [name] Date of Service: 04/22/2016 1:45 PM Medical Record Number: [id number] Patient Account Number: [id number] Date of Birth/Sex: Dec 27, 1920 ([name]) Treating RN: [name], [name] Primary Care Physician: [name] BambergerFULLER, [name] Other Clinician: Referring Physician: Tomi BambergerFULLER, [name] Treating Physician/Extender: [name] DibblesSTONE III,  [name] Weeks in Treatment: 4 Encounter Discharge Information Items Schedule Follow-up Appointment: No Medication Reconciliation completed No and provided to Patient/Care Jhoselin Crume: Provided on Clinical Summary of Care: 04/22/2016 Form Type Recipient Paper Patient ES Electronic Signature(s) Signed: 04/22/2016 2:34:44 PM By: Gwenlyn PerkingMoore, Shelia Entered By: Gwenlyn PerkingMoore, Shelia on 04/22/2016 14:34:44 Klinger, [name] SeatsELIZABETH L. ([id number]) -------------------------------------------------------------------------------- Lower Extremity Assessment Details Patient Name: [name], [name] Date of Service: 04/22/2016 1:45 PM Medical Record Number: [id number] Patient Account Number: [id number] Date of Birth/Sex: Dec 27, 1920 ([name]) Treating RN: [name] CoventryWoody, Kim Primary Care Physician: [name] BambergerFULLER, [name] Other Clinician: Referring Physician: Tomi BambergerFULLER, [name] Treating Physician/Extender: [name] DibblesSTONE III, [name] Weeks in Treatment: 4 Vascular Assessment Pulses: Posterior Tibial Palpable: [Right:Yes] Dorsalis Pedis Palpable: [Right:Yes] Extremity colors, hair growth, and conditions: Extremity Color: [Right:Pale] Hair Growth on Extremity: [Right:No] Temperature of Extremity: [Right:Warm] Capillary Refill: [Right:> 3 seconds] Dependent Rubor: [Right:No] Blanched when Elevated: [Right:No] Lipodermatosclerosis: [Right:No] Toe Nail Assessment Left: Right: Thick: Yes Discolored: Yes Deformed: Yes Improper Length and Hygiene: Yes Electronic Signature(s) Signed: 04/23/2016 4:28:52 PM By: Elliot GurneyWoody, RN, BSN, Kim RN, BSN Entered By: Elliot GurneyWoody, RN, BSN, Kim on 04/22/2016 14:10:38 Somoza, [name] SeatsELIZABETH L. ([id number]) -------------------------------------------------------------------------------- Multi Wound Chart Details Patient Name: [name], [name] Date of Service: 04/22/2016 1:45 PM Medical Record Number: [id number] Patient Account Number: [id number] Date of Birth/Sex: Dec 27, 1920 ([name]) Treating RN: [name] CoventryWoody,  Kim Primary Care Physician: [name] BambergerFULLER, [name] Other Clinician: Referring Physician: Tomi BambergerFULLER, [name] Treating Physician/Extender: [name] DibblesSTONE III, [name] Weeks in Treatment: 4 Vital Signs Height(in): 68 Pulse(bpm): 87 Weight(lbs): 118 Blood Pressure 143/78 (mmHg): Body Mass Index(BMI): 18 Temperature(F): 97.6 Respiratory Rate 16 (breaths/min): Photos: [2:No Photos] [3:No Photos] [4:No Photos] Wound Location: [2:Right, Proximal, Lateral Lower Leg] [3:Right, Distal, Lateral Lower Leg] [4:Right, Medial Calcaneus] Wounding Event: [2:Pressure Injury] [3:Pressure Injury] [4:Pressure Injury] Primary Etiology: [2:Pressure Ulcer] [3:Pressure Ulcer] [4:Pressure Ulcer] Date Acquired: [2:01/02/2016] [3:01/02/2016] [  4:01/02/2016] Weeks of Treatment: [2:4] [3:4] [4:4] Wound Status: [2:Open] [3:Open] [4:Open] Measurements L x W x D 0.4x0.2x0.1 [3:1x0.2x0.1] [4:2.5x2.5x0.2] (cm) Area (cm) : [2:0.063] [3:0.157] [4:4.909] Volume (cm) : [2:0.006] [3:0.016] [4:0.982] % Reduction in Area: [2:96.00%] [3:96.00%] [4:-212.50%] % Reduction in Volume: 99.50% [3:99.60%] [4:-525.50%] Classification: [2:Category/Stage IV] [3:Category/Stage III] [4:Category/Stage II] Periwound Skin Texture: No Abnormalities Noted [3:No Abnormalities Noted] [4:No Abnormalities Noted] Periwound Skin [2:No Abnormalities Noted] [3:No Abnormalities Noted] [4:No Abnormalities Noted] Moisture: Periwound Skin Color: No Abnormalities Noted [3:No Abnormalities Noted] [4:No Abnormalities Noted] Tenderness on [2:No] [3:No] [4:No] Treatment Notes Electronic Signature(s) Signed: 04/23/2016 4:28:52 PM By: Elliot Gurney, RN, BSN, Kim RN, BSN Entered By: Elliot Gurney, RN, BSN, Kim on 04/22/2016 14:38:11 Howes, [name] Seats ([id number]) Wiest, [name] Seats ([id number]) -------------------------------------------------------------------------------- Multi-Disciplinary Care Plan Details Patient Name: [name], [name] Date of Service: 04/22/2016 1:45  PM Medical Record Number: [id number] Patient Account Number: [id number] Date of Birth/Sex: 01/28/1921 ([name]) Treating RN: [name] Primary Care Physician: [name] Other Clinician: Referring Physician: Tomi [name] Treating Physician/Extender: [name], [name] Weeks in Treatment: 4 Active Inactive Orientation to the Wound Care Program Nursing Diagnoses: Knowledge deficit related to the wound healing center program Goals: Patient/caregiver will verbalize understanding of the Wound Healing Center Program Date Initiated: 03/24/2016 Goal Status: Active Interventions: Provide education on orientation to the wound center Notes: Pressure Nursing Diagnoses: Knowledge deficit related to causes and risk factors for pressure ulcer development Knowledge deficit related to management of pressures ulcers Potential for impaired tissue integrity related to pressure, friction, moisture, and shear Goals: Patient will remain free from development of additional pressure ulcers Date Initiated: 03/24/2016 Goal Status: Active Patient will remain free of pressure ulcers Date Initiated: 03/24/2016 Goal Status: Active Patient/caregiver will verbalize risk factors for pressure ulcer development Date Initiated: 03/24/2016 Goal Status: Active Patient/caregiver will verbalize understanding of pressure ulcer management Date Initiated: 03/24/2016 Goal Status: Active Interventions: EUDELIA, HILTUNEN ([id number]) Assess: immobility, friction, shearing, incontinence upon admission and as needed Assess offloading mechanisms upon admission and as needed Assess potential for pressure ulcer upon admission and as needed Provide education on pressure ulcers Treatment Activities: Patient referred for home evaluation of offloading devices/mattresses : 03/24/2016 Patient referred for pressure reduction/relief devices : 03/24/2016 Patient referred for seating evaluation to ensure proper offloading :  03/24/2016 Pressure reduction/relief device ordered : 03/24/2016 Notes: Wound/Skin Impairment Nursing Diagnoses: Impaired tissue integrity Knowledge deficit related to smoking impact on wound healing Knowledge deficit related to ulceration/compromised skin integrity Goals: Patient/caregiver will verbalize understanding of skin care regimen Date Initiated: 03/24/2016 Goal Status: Active Ulcer/skin breakdown will have a volume reduction of 30% by week 4 Date Initiated: 03/24/2016 Goal Status: Active Ulcer/skin breakdown will have a volume reduction of 50% by week 8 Date Initiated: 03/24/2016 Goal Status: Active Ulcer/skin breakdown will have a volume reduction of 80% by week 12 Date Initiated: 03/24/2016 Goal Status: Active Ulcer/skin breakdown will heal within 14 weeks Date Initiated: 03/24/2016 Goal Status: Active Interventions: Assess patient/caregiver ability to obtain necessary supplies Assess patient/caregiver ability to perform ulcer/skin care regimen upon admission and as needed Assess ulceration(s) every visit Provide education on ulcer and skin care Treatment Activities: Patient referred to home care : 03/24/2016 [name], [name] ([id number]) Skin care regimen initiated : 03/24/2016 Topical wound management initiated : 03/24/2016 Notes: Electronic Signature(s) Signed: 04/23/2016 4:28:52 PM By: Elliot Gurney, RN, BSN, Kim RN, BSN Entered By: Elliot Gurney, RN, BSN, Kim on 04/22/2016 14:31:43 Mosely, [name] Seats ([id number]) -------------------------------------------------------------------------------- Pain Assessment Details Patient Name: [name], [name] Date  of Service: 04/22/2016 1:45 PM Medical Record Number: [id number] Patient Account Number: [id number] Date of Birth/Sex: May 02, 1921 ([name]) Treating RN: [name] Primary Care Physician: [name] Other Clinician: Referring Physician: Tomi [name] Treating Physician/Extender: [name], [name] Weeks in Treatment:  4 Active Problems Location of Pain Severity and Description of Pain Patient Has Paino No Site Locations With Dressing Change: No Pain Management and Medication Current Pain Management: Electronic Signature(s) Signed: 04/23/2016 4:28:52 PM By: Elliot Gurney, RN, BSN, Kim RN, BSN Entered By: Elliot Gurney, RN, BSN, Kim on 04/22/2016 14:02:27 Sellin, [name] Seats ([id number]) -------------------------------------------------------------------------------- Patient/Caregiver Education Details Patient Name: [name] L. Date of Service: 04/22/2016 1:45 PM Medical Record Number: [id number] Patient Account Number: [id number] Date of Birth/Gender: January 31, 1921 ([name]) Treating RN: [name] Primary Care Physician: [name] Other Clinician: Referring Physician: Tomi [name] Treating Physician/Extender: Skeet Simmer in Treatment: 4 Education Assessment Education Provided To: Patient Education Topics Provided Wound/Skin Impairment: Handouts: Caring for Your Ulcer Methods: Demonstration Responses: State content correctly Electronic Signature(s) Signed: 04/23/2016 4:28:52 PM By: Elliot Gurney, RN, BSN, Kim RN, BSN Entered By: Elliot Gurney, RN, BSN, Kim on 04/22/2016 14:40:31 Monacelli, [name] Seats ([id number]) -------------------------------------------------------------------------------- Wound Assessment Details Patient Name: [name], [name] Date of Service: 04/22/2016 1:45 PM Medical Record Number: [id number] Patient Account Number: [id number] Date of Birth/Sex: 03/22/1921 ([name]) Treating RN: [name] Primary Care Physician: [name] Other Clinician: Referring Physician: Tomi [name] Treating Physician/Extender: [name], [name] Weeks in Treatment: 4 Wound Status Wound Number: 2 Primary Etiology: Pressure Ulcer Wound Location: Right, Proximal, Lateral Lower Wound Status: Open Leg Wounding Event: Pressure Injury Date Acquired: 01/02/2016 Weeks Of Treatment:  4 Clustered Wound: No Wound Measurements Length: (cm) 0.4 Width: (cm) 0.2 Depth: (cm) 0.1 Area: (cm) 0.063 Volume: (cm) 0.006 % Reduction in Area: 96% % Reduction in Volume: 99.5% Wound Description Classification: Category/Stage IV Periwound Skin Texture Texture Color No Abnormalities Noted: No No Abnormalities Noted: No Moisture No Abnormalities Noted: No Treatment Notes Wound #2 (Right, Proximal, Lateral Lower Leg) 1. Cleansed with: Clean wound with Normal Saline 2. Anesthetic Topical Lidocaine 4% cream to wound bed prior to debridement 4. Dressing Applied: Aquacel Ag 5. Secondary Dressing Applied ABD Pad Kerlix/Conform 7. Secured with Tape [name], [name] ([id number]) Notes netting, heel cup Electronic Signature(s) Signed: 04/23/2016 4:28:52 PM By: Elliot Gurney, RN, BSN, Kim RN, BSN Entered By: Elliot Gurney, RN, BSN, Kim on 04/22/2016 14:24:49 Vanengen, [name] Seats ([id number]) -------------------------------------------------------------------------------- Wound Assessment Details Patient Name: [name], [name] Date of Service: 04/22/2016 1:45 PM Medical Record Number: [id number] Patient Account Number: [id number] Date of Birth/Sex: September 16, 1920 ([name]) Treating RN: [name] Primary Care Physician: [name] Other Clinician: Referring Physician: Tomi [name] Treating Physician/Extender: [name], [name] Weeks in Treatment: 4 Wound Status Wound Number: 3 Primary Etiology: Pressure Ulcer Wound Location: Right, Distal, Lateral Lower Leg Wound Status: Open Wounding Event: Pressure Injury Date Acquired: 01/02/2016 Weeks Of Treatment: 4 Clustered Wound: No Wound Measurements Length: (cm) 1 Width: (cm) 0.2 Depth: (cm) 0.1 Area: (cm) 0.157 Volume: (cm) 0.016 % Reduction in Area: 96% % Reduction in Volume: 99.6% Wound Description Classification: Category/Stage III Periwound Skin Texture Texture Color No Abnormalities Noted: No No  Abnormalities Noted: No Moisture No Abnormalities Noted: No Treatment Notes Wound #3 (Right, Distal, Lateral Lower Leg) 1. Cleansed with: Clean wound with Normal Saline 2. Anesthetic Topical Lidocaine 4% cream to wound bed prior to debridement 4. Dressing Applied: Aquacel Ag 5. Secondary Dressing Applied ABD Pad Kerlix/Conform 7. Secured with Tape  Notes [name], CASTORO ([id number]) netting, heel cup Electronic Signature(s) Signed: 04/23/2016 4:28:52 PM By: Elliot Gurney, RN, BSN, Kim RN, BSN Entered By: Elliot Gurney, RN, BSN, Kim on 04/22/2016 14:24:51 Rudy, [name] Seats ([id number]) -------------------------------------------------------------------------------- Wound Assessment Details Patient Name: [name], [name] Date of Service: 04/22/2016 1:45 PM Medical Record Number: [id number] Patient Account Number: [id number] Date of Birth/Sex: 1921/03/08 ([name]) Treating RN: [name] Primary Care Physician: [name] Other Clinician: Referring Physician: Tomi [name] Treating Physician/Extender: [name], [name] Weeks in Treatment: 4 Wound Status Wound Number: 4 Primary Etiology: Pressure Ulcer Wound Location: Right, Medial Calcaneus Wound Status: Open Wounding Event: Pressure Injury Date Acquired: 01/02/2016 Weeks Of Treatment: 4 Clustered Wound: No Wound Measurements Length: (cm) 2.5 Width: (cm) 2.5 Depth: (cm) 0.2 Area: (cm) 4.909 Volume: (cm) 0.982 % Reduction in Area: -212.5% % Reduction in Volume: -525.5% Wound Description Classification: Category/Stage II Periwound Skin Texture Texture Color No Abnormalities Noted: No No Abnormalities Noted: No Moisture No Abnormalities Noted: No Treatment Notes Wound #4 (Right, Medial Calcaneus) 1. Cleansed with: Clean wound with Normal Saline 2. Anesthetic Topical Lidocaine 4% cream to wound bed prior to debridement 4. Dressing Applied: Aquacel Ag 5. Secondary Dressing Applied ABD  Pad Kerlix/Conform 7. Secured with Tape Notes RAMSEY, MIDGETT ([id number]) netting, heel cup Electronic Signature(s) Signed: 04/23/2016 4:28:52 PM By: Elliot Gurney, RN, BSN, Kim RN, BSN Entered By: Elliot Gurney, RN, BSN, Kim on 04/22/2016 14:24:52 Ghee, [name] Seats ([id number]) -------------------------------------------------------------------------------- Vitals Details Patient Name: [name] L. Date of Service: 04/22/2016 1:45 PM Medical Record Number: [id number] Patient Account Number: [id number] Date of Birth/Sex: 07/05/1921 ([name]) Treating RN: [name] Primary Care Physician: [name] Other Clinician: Referring Physician: Tomi [name] Treating Physician/Extender: [name], [name] Weeks in Treatment: 4 Vital Signs Time Taken: 14:02 Temperature (F): 97.6 Height (in): 68 Pulse (bpm): 87 Weight (lbs): 118 Respiratory Rate (breaths/min): 16 Body Mass Index (BMI): 17.9 Blood Pressure (mmHg): 143/78 Reference Range: 80 - 120 mg / dl Electronic Signature(s) Signed: 04/23/2016 4:28:52 PM By: Elliot Gurney, RN, BSN, Kim RN, BSN Entered By: Elliot Gurney, RN, BSN, Kim on 04/22/2016 14:03:06

## 2016-04-24 NOTE — Progress Notes (Signed)
[name], [name] Visit Report for 04/15/2016 Arrival Information Details Lum [name], [name] Date of Service: 04/15/2016 1:30 PM Patient Name: [name] Account Number: [id number] Medical Record Treating RN: Phillis Haggisinkerton, Debi [id number] Number: Other Clinician: Nov 26, 1920 (82 y.o. Treating [name], [name] Date of Birth/Sex: Female) Physician/Extender: G Primary Care FULLER, SUSAN Physician: Referring Physician: Meliton RattanFULLER, SUSAN Weeks in Treatment: 3 Visit Information History Since Last Visit All ordered tests and consults were completed: No Patient Arrived: Wheel Chair Added or deleted any medications: No Arrival Time: 13:36 Any new allergies or adverse reactions: No Accompanied By: son Had a fall or experienced change in No Transfer Assistance: [employer] activities of daily living that may affect Patient Lift risk of falls: Patient Identification Verified: Yes Signs or symptoms of abuse/neglect since last No Secondary Verification Process Yes visito Completed: Hospitalized since last visit: No Patient Requires Transmission- No Pain Present Now: No Based Precautions: Patient Has Alerts: No Electronic Signature(s) Signed: 04/15/2016 5:55:37 PM By: Alejandro MullingPinkerton, Debra Entered By: Alejandro MullingPinkerton, Debra on 04/15/2016 13:37:12 Lau, [name] SeatsELIZABETH [name] -------------------------------------------------------------------------------- Encounter Discharge Information Details Lum [name], [name] Date of Service: 04/15/2016 1:30 PM Patient Name: [name] Account Number: [id number] Medical Record Treating RN: Phillis Haggisinkerton, Debi [id number] Number: Other Clinician: Nov 26, 1920 (82 y.o. Treating [name], [name] Date of Birth/Sex: Female) Physician/Extender: G Primary Care FULLER, SUSAN Physician: Referring Physician: Meliton RattanFULLER, SUSAN Weeks in Treatment: 3 Encounter Discharge Information Items Discharge Pain Level: 0 Discharge Condition: Stable Ambulatory Status:  Wheelchair Discharge Destination: Home Transportation: Private Auto Accompanied By: son Schedule Follow-up Appointment: Yes Medication Reconciliation completed and provided to Patient/Care Yes Jeanne Terrance: Provided on Clinical Summary of Care: 04/15/2016 Form Type Recipient Paper Patient ES Electronic Signature(s) Signed: 04/15/2016 2:18:18 PM By: Gwenlyn PerkingMoore, Shelia Entered By: Gwenlyn PerkingMoore, Shelia on 04/15/2016 14:18:17 [name], [name] SeatsELIZABETH [name] -------------------------------------------------------------------------------- Lower Extremity Assessment Details Lum [name], [name] Date of Service: 04/15/2016 1:30 PM Patient Name: [name] Account Number: [id number] Medical Record Treating RN: Phillis Haggisinkerton, Debi [id number] Number: Other Clinician: Nov 26, 1920 (82 y.o. Treating [name], [name] Date of Birth/Sex: Female) Physician/Extender: G Primary Care FULLER, SUSAN Physician: Referring Physician: Tomi BambergerFULLER, SUSAN Weeks in Treatment: 3 Vascular Assessment Pulses: Posterior Tibial Dorsalis Pedis Palpable: [Right:Yes] Extremity colors, hair growth, and conditions: Extremity Color: [Right:Mottled] Temperature of Extremity: [Right:Warm] Capillary Refill: [Right:< 3 seconds] Toe Nail Assessment Left: Right: Thick: Yes Discolored: Yes Deformed: Yes Improper Length and Hygiene: Yes Electronic Signature(s) Signed: 04/15/2016 5:55:37 PM By: Alejandro MullingPinkerton, Debra Entered By: Alejandro MullingPinkerton, Debra on 04/15/2016 13:41:30 Hillyard, [name] SeatsELIZABETH L. ([id number]) -------------------------------------------------------------------------------- Multi Wound Chart Details Lum [name], [name] Date of Service: 04/15/2016 1:30 PM Patient Name: [name] Account Number: [id number] Medical Record Treating RN: Phillis Haggisinkerton, Debi [id number] Number: Other Clinician: Nov 26, 1920 (82 y.o. Treating [name], [name] Date of Birth/Sex: Female) Physician/Extender: G Primary Care FULLER, SUSAN Physician: Referring Physician:  Tomi BambergerFULLER, SUSAN Weeks in Treatment: 3 Vital Signs Height(in): 68 Pulse(bpm): 79 Weight(lbs): 118 Blood Pressure 128/78 (mmHg): Body Mass Index(BMI): 18 Temperature(F): Respiratory Rate 16 (breaths/min): Photos: [2:No Photos] [3:No Photos] [4:No Photos] Wound Location: [2:Right Lower Leg - Lateral, Proximal] [3:Right Lower Leg - Lateral, Distal] [4:Right Calcaneus - Medial] Wounding Event: [2:Pressure Injury] [3:Pressure Injury] [4:Pressure Injury] Primary Etiology: [2:Pressure Ulcer] [3:Pressure Ulcer] [4:Pressure Ulcer] Comorbid History: [2:Cataracts, Anemia, Hypertension, History of pressure wounds, Osteoarthritis, Dementia] [3:Cataracts, Anemia, Hypertension, History of pressure wounds, Osteoarthritis, Dementia] [4:Cataracts, Anemia, Hypertension, History of pressure  wounds, Osteoarthritis, Dementia] Date Acquired: [2:01/02/2016] [3:01/02/2016] [4:01/02/2016] Weeks of Treatment: [2:3] [3:3] [4:3] Wound Status: [2:Open] [3:Open] [4:Open] Measurements L x W x D 1x0.5x0.4 [3:1.3x0.6x0.3] [4:3x7x0.2] (cm) Area (cm) : [2:0.393] [3:0.613] [  4:16.493] Volume (cm) : [2:0.157] [3:0.184] [4:3.299] % Reduction in Area: [2:75.00%] [3:84.40%] [4:-949.80%] % Reduction in Volume: 87.50% [3:95.30%] [4:-2001.30%] Starting Position 1 [3:11] (o'clock): Ending Position 1 [3:7] (o'clock): Maximum Distance 1 [3:0.3] (cm): Undermining: [2:No] [3:Yes] [4:No] Classification: [2:Category/Stage IV] [3:Category/Stage III] [4:Category/Stage II] Exudate Amount: Large Medium Medium Exudate Type: Serosanguineous Serosanguineous Serosanguineous Exudate Color: red, brown red, brown red, brown Foul Odor After Yes Yes No Cleansing: Odor Anticipated Due to No No N/A Product Use: Wound Margin: Distinct, outline attached Distinct, outline attached Distinct, outline attached Granulation Amount: Medium (34-66%) Medium (34-66%) Small (1-33%) Granulation Quality: Red Pink, Pale Red, Pink Necrotic Amount:  Medium (34-66%) Medium (34-66%) Large (67-100%) Exposed Structures: Tendon: Yes Fascia: No Fascia: No Fascia: No Fat: No Fat: No Fat: No Tendon: No Tendon: No Muscle: No Muscle: No Muscle: No Joint: No Joint: No Joint: No Bone: No Bone: No Bone: No Limited to Skin Limited to Skin Breakdown Breakdown Epithelialization: None Small (1-33%) None Periwound Skin Texture: Edema: No Edema: No Edema: No Excoriation: No Excoriation: No Excoriation: No Induration: No Induration: No Induration: No Callus: No Callus: No Callus: No Crepitus: No Crepitus: No Crepitus: No Fluctuance: No Fluctuance: No Fluctuance: No Friable: No Friable: No Friable: No Rash: No Rash: No Rash: No Scarring: No Scarring: No Scarring: No Periwound Skin Moist: Yes Moist: Yes Moist: Yes Moisture: Maceration: No Maceration: No Maceration: No Dry/Scaly: No Dry/Scaly: No Dry/Scaly: No Periwound Skin Color: Atrophie Blanche: No Atrophie Blanche: No Atrophie Blanche: No Cyanosis: No Cyanosis: No Cyanosis: No Ecchymosis: No Ecchymosis: No Ecchymosis: No Erythema: No Erythema: No Erythema: No Hemosiderin Staining: No Hemosiderin Staining: No Hemosiderin Staining: No Mottled: No Mottled: No Mottled: No Pallor: No Pallor: No Pallor: No Rubor: No Rubor: No Rubor: No Temperature: No Abnormality No Abnormality No Abnormality Tenderness on Yes Yes Yes Palpation: Wound Preparation: Ulcer Cleansing: Ulcer Cleansing: Ulcer Cleansing: Rinsed/Irrigated with Rinsed/Irrigated with Rinsed/Irrigated with Saline Saline Saline Topical Anesthetic Topical Anesthetic Topical Anesthetic Applied: Other: lidocaine Applied: Other: lidocaine Applied: Other: Lidocaine 4% 4% 4% [name], [name] ([id number]) Treatment Notes Electronic Signature(s) Signed: 04/15/2016 5:55:37 PM By: [name] Entered By: [name] on 04/15/2016 13:50:46 Walder, [name]  ([id number]) -------------------------------------------------------------------------------- Multi-Disciplinary Care Plan Details [name] Date of Service: 04/15/2016 1:30 PM Patient Name: [name] Account Number: [id number] Medical Record Treating RN: [name] [id number] Number: Other Clinician: 04/08/1921 (82 y.o. Treating [name], [name] Date of Birth/Sex: Female) Physician/Extender: G Primary Care FULLER, SUSAN Physician: Referring Physician: Meliton Rattan in Treatment: 3 Active Inactive Orientation to the Wound Care Program Nursing Diagnoses: Knowledge deficit related to the wound healing center program Goals: Patient/caregiver will verbalize understanding of the Wound Healing Center Program Date Initiated: 03/24/2016 Goal Status: Active Interventions: Provide education on orientation to the wound center Notes: Pressure Nursing Diagnoses: Knowledge deficit related to causes and risk factors for pressure ulcer development Knowledge deficit related to management of pressures ulcers Potential for impaired tissue integrity related to pressure, friction, moisture, and shear Goals: Patient will remain free from development of additional pressure ulcers Date Initiated: 03/24/2016 Goal Status: Active Patient will remain free of pressure ulcers Date Initiated: 03/24/2016 Goal Status: Active Patient/caregiver will verbalize risk factors for pressure ulcer development Date Initiated: 03/24/2016 Goal Status: Active Patient/caregiver will verbalize understanding of pressure ulcer management [name], [name] Date Initiated: 03/24/2016 Goal Status: Active Interventions: Assess: immobility, friction, shearing, incontinence upon admission and as needed Assess offloading mechanisms upon admission and as needed Assess potential for pressure ulcer  upon admission and as needed Provide education on pressure ulcers Treatment Activities: Patient referred  for home evaluation of offloading devices/mattresses : 03/24/2016 Patient referred for pressure reduction/relief devices : 03/24/2016 Patient referred for seating evaluation to ensure proper offloading : 03/24/2016 Pressure reduction/relief device ordered : 03/24/2016 Notes: Wound/Skin Impairment Nursing Diagnoses: Impaired tissue integrity Knowledge deficit related to smoking impact on wound healing Knowledge deficit related to ulceration/compromised skin integrity Goals: Patient/caregiver will verbalize understanding of skin care regimen Date Initiated: 03/24/2016 Goal Status: Active Ulcer/skin breakdown will have a volume reduction of 30% by week 4 Date Initiated: 03/24/2016 Goal Status: Active Ulcer/skin breakdown will have a volume reduction of 50% by week 8 Date Initiated: 03/24/2016 Goal Status: Active Ulcer/skin breakdown will have a volume reduction of 80% by week 12 Date Initiated: 03/24/2016 Goal Status: Active Ulcer/skin breakdown will heal within 14 weeks Date Initiated: 03/24/2016 Goal Status: Active Interventions: Assess patient/caregiver ability to obtain necessary supplies Assess patient/caregiver ability to perform ulcer/skin care regimen upon admission and as needed Assess ulceration(s) every visit [name], [name] ([id number]) Provide education on ulcer and skin care Treatment Activities: Patient referred to home care : 03/24/2016 Skin care regimen initiated : 03/24/2016 Topical wound management initiated : 03/24/2016 Notes: Electronic Signature(s) Signed: 04/15/2016 5:55:37 PM By: [name] Entered By: [name] on 04/15/2016 13:50:37 [name], [name] ([id number]) -------------------------------------------------------------------------------- Pain Assessment Details [name] Date of Service: 04/15/2016 1:30 PM Patient Name: [name] Account Number: [id number] Medical Record Treating RN: [name] [id number] Number: Other  Clinician: 1920/08/28 (82 y.o. Treating [name], [name] Date of Birth/Sex: Female) Physician/Extender: G Primary Care FULLER, SUSAN Physician: Referring Physician: Meliton Rattan in Treatment: 3 Active Problems Location of Pain Severity and Description of Pain Patient Has Paino No Site Locations With Dressing Change: No Pain Management and Medication Current Pain Management: Electronic Signature(s) Signed: 04/15/2016 5:55:37 PM By: [name] Entered By: [name] on 04/15/2016 13:37:42 Volkman, [name] -------------------------------------------------------------------------------- Patient/Caregiver Education Details [name] Date of Service: 04/15/2016 1:30 PM Patient Name: [name] Account Number: [id number] Medical Record Treating RN: [name] [id number] Number: Other Clinician: 1920/12/18 (82 y.o. Treating [name], [name] Date of Birth/Gender: Female) Physician/Extender: G Primary Care Physician: Meliton Rattan in Treatment: 3 Referring Physician: [name] Education Assessment Education Provided To: Patient Education Topics Provided Wound/Skin Impairment: Handouts: Other: change dressing as ordered Methods: Demonstration, Explain/Verbal Responses: State content correctly Electronic Signature(s) Signed: 04/15/2016 5:55:37 PM By: [name] Entered By: [name] on 04/15/2016 14:17:21 Ertl, [name] ([id number]) -------------------------------------------------------------------------------- Wound Assessment Details [name] Date of Service: 04/15/2016 1:30 PM Patient Name: [name] Account Number: [id number] Medical Record Treating RN: [name] [id number] Number: Other Clinician: Mar 01, 1921 (82 y.o. Treating [name], [name] Date of Birth/Sex: Female) Physician/Extender: G Primary Care FULLER, SUSAN Physician: Referring Physician: [name] Weeks in  Treatment: 3 Wound Status Wound Number: 2 Primary Pressure Ulcer Etiology: Wound Location: Right Lower Leg - Lateral, Proximal Wound Open Status: Wounding Event: Pressure Injury Comorbid Cataracts, Anemia, Hypertension, Date Acquired: 01/02/2016 History: History of pressure wounds, Weeks Of Treatment: 3 Osteoarthritis, Dementia Clustered Wound: No Photos Wound Measurements Length: (cm) 1 Width: (cm) 0.5 Depth: (cm) 0.4 Area: (cm) 0.393 Volume: (cm) 0.157 % Reduction in Area: 75% % Reduction in Volume: 87.5% Epithelialization: None Tunneling: No Undermining: No Wound Description Classification: Category/Stage IV Foul Odor Af Wound Margin: Distinct, outline attached Due to Produ Exudate Amount: Large Exudate Type: Serosanguineous Exudate Color: red, brown ter Cleansing: Yes ct Use: No Wound Bed  Granulation Amount: Medium (34-66%) Exposed Structure Atilano, Aissatou L. ([id number]) Granulation Quality: Red Fascia Exposed: No Necrotic Amount: Medium (34-66%) Fat Layer Exposed: No Necrotic Quality: Adherent Slough Tendon Exposed: Yes Muscle Exposed: No Joint Exposed: No Bone Exposed: No Periwound Skin Texture Texture Color No Abnormalities Noted: No No Abnormalities Noted: No Callus: No Atrophie Blanche: No Crepitus: No Cyanosis: No Excoriation: No Ecchymosis: No Fluctuance: No Erythema: No Friable: No Hemosiderin Staining: No Induration: No Mottled: No Localized Edema: No Pallor: No Rash: No Rubor: No Scarring: No Temperature / Pain Moisture Temperature: No Abnormality No Abnormalities Noted: No Tenderness on Palpation: Yes Dry / Scaly: No Maceration: No Moist: Yes Wound Preparation Ulcer Cleansing: Rinsed/Irrigated with Saline Topical Anesthetic Applied: Other: lidocaine 4%, Treatment Notes Wound #2 (Right, Proximal, Lateral Lower Leg) 1. Cleansed with: Clean wound with Normal Saline 2. Anesthetic Topical Lidocaine 4% cream to wound  bed prior to debridement 4. Dressing Applied: Aquacel Ag 5. Secondary Dressing Applied ABD Pad Dry Gauze Kerlix/Conform 7. Secured with Tape Notes netting [name], [name] ([id number]) Electronic Signature(s) Signed: 04/15/2016 5:55:37 PM By: [name] Entered By: [name] on 04/15/2016 17:34:45 [name], [name] -------------------------------------------------------------------------------- Wound Assessment Details [name] Date of Service: 04/15/2016 1:30 PM Patient Name: [name] Account Number: [id number] Medical Record Treating RN: [name] [id number] Number: Other Clinician: October 17, 1920 (82 y.o. Treating [name], [name] Date of Birth/Sex: Female) Physician/Extender: G Primary Care FULLER, SUSAN Physician: Referring Physician: [name] Weeks in Treatment: 3 Wound Status Wound Number: 3 Primary Pressure Ulcer Etiology: Wound Location: Right Lower Leg - Lateral, Distal Wound Open Status: Wounding Event: Pressure Injury Comorbid Cataracts, Anemia, Hypertension, Date Acquired: 01/02/2016 History: History of pressure wounds, Weeks Of Treatment: 3 Osteoarthritis, Dementia Clustered Wound: No Photos Wound Measurements Length: (cm) 1.3 % Reduction i Width: (cm) 0.6 % Reduction i Depth: (cm) 0.3 Epithelializa Area: (cm) 0.613 Tunneling: Volume: (cm) 0.184 Undermining: Starting P Ending Pos Maximum Di n Area: 84.4% n Volume: 95.3% tion: Small (1-33%) No Yes osition (o'clock): 11 ition (o'clock): 7 stance: (cm) 0.3 Wound Description Classification: Category/Stage III Wound Margin: Distinct, outline attached Exudate Amount: Medium Noffke, [name] ([id number]) Foul Odor After Cleansing: Yes Due to Product Use: No Exudate Type: Serosanguineous Exudate Color: red, brown Wound Bed Granulation Amount: Medium (34-66%) Exposed Structure Granulation Quality: Pink, Pale Fascia Exposed:  No Necrotic Amount: Medium (34-66%) Fat Layer Exposed: No Necrotic Quality: Adherent Slough Tendon Exposed: No Muscle Exposed: No Joint Exposed: No Bone Exposed: No Limited to Skin Breakdown Periwound Skin Texture Texture Color No Abnormalities Noted: No No Abnormalities Noted: No Callus: No Atrophie Blanche: No Crepitus: No Cyanosis: No Excoriation: No Ecchymosis: No Fluctuance: No Erythema: No Friable: No Hemosiderin Staining: No Induration: No Mottled: No Localized Edema: No Pallor: No Rash: No Rubor: No Scarring: No Temperature / Pain Moisture Temperature: No Abnormality No Abnormalities Noted: No Tenderness on Palpation: Yes Dry / Scaly: No Maceration: No Moist: Yes Wound Preparation Ulcer Cleansing: Rinsed/Irrigated with Saline Topical Anesthetic Applied: Other: lidocaine 4%, Treatment Notes Wound #3 (Right, Distal, Lateral Lower Leg) 1. Cleansed with: Clean wound with Normal Saline 2. Anesthetic Topical Lidocaine 4% cream to wound bed prior to debridement 4. Dressing Applied: Aquacel Ag 5. Secondary Dressing Applied ABD Pad Dry Gauze [name], [name] ([id number]) Kerlix/Conform 7. Secured with Tape Notes netting Electronic Signature(s) Signed: 04/15/2016 5:55:37 PM By: [name] Entered By: [name] on 04/15/2016 17:35:06 [name], [name] ([id number]) -------------------------------------------------------------------------------- Wound Assessment Details [name] Date of Service: 04/15/2016 1:30  PM Patient Name: [name] Account Number: [id number] Medical Record Treating RN: [name] [id number] Number: Other Clinician: 18-Feb-1921 (82 y.o. Treating [name], [name] Date of Birth/Sex: Female) Physician/Extender: G Primary Care FULLER, SUSAN Physician: Referring Physician: [name] Weeks in Treatment: 3 Wound Status Wound Number: 4 Primary Pressure Ulcer Etiology: Wound Location: Right  Calcaneus - Medial Wound Open Wounding Event: Pressure Injury Status: Date Acquired: 01/02/2016 Comorbid Cataracts, Anemia, Hypertension, Weeks Of Treatment: 3 History: History of pressure wounds, Clustered Wound: No Osteoarthritis, Dementia Photos Wound Measurements Length: (cm) 3 Width: (cm) 7 Depth: (cm) 0.2 Area: (cm) 16.493 Volume: (cm) 3.299 % Reduction in Area: -949.8% % Reduction in Volume: -2001.3% Epithelialization: None Tunneling: No Undermining: No Wound Description Classification: Category/Stage II Wound Margin: Distinct, outline attached Exudate Amount: Medium Exudate Type: Serosanguineous Exudate Color: red, brown Foul Odor After Cleansing: No Wound Bed Granulation Amount: Small (1-33%) Exposed Structure [name], [name] ([id number]) Granulation Quality: Red, Pink Fascia Exposed: No Necrotic Amount: Large (67-100%) Fat Layer Exposed: No Necrotic Quality: Adherent Slough Tendon Exposed: No Muscle Exposed: No Joint Exposed: No Bone Exposed: No Limited to Skin Breakdown Periwound Skin Texture Texture Color No Abnormalities Noted: No No Abnormalities Noted: No Callus: No Atrophie Blanche: No Crepitus: No Cyanosis: No Excoriation: No Ecchymosis: No Fluctuance: No Erythema: No Friable: No Hemosiderin Staining: No Induration: No Mottled: No Localized Edema: No Pallor: No Rash: No Rubor: No Scarring: No Temperature / Pain Moisture Temperature: No Abnormality No Abnormalities Noted: No Tenderness on Palpation: Yes Dry / Scaly: No Maceration: No Moist: Yes Wound Preparation Ulcer Cleansing: Rinsed/Irrigated with Saline Topical Anesthetic Applied: Other: Lidocaine 4%, Treatment Notes Wound #4 (Right, Medial Calcaneus) 1. Cleansed with: Clean wound with Normal Saline 2. Anesthetic Topical Lidocaine 4% cream to wound bed prior to debridement 4. Dressing Applied: Aquacel Ag 5. Secondary Dressing Applied ABD Pad Dry  Gauze Kerlix/Conform 7. Secured with Tape Notes netting [name], [name] ([id number]) Electronic Signature(s) Signed: 04/15/2016 5:55:37 PM By: [name] Entered By: [name] on 04/15/2016 17:35:46 Montanaro, [name] ([id number]) -------------------------------------------------------------------------------- Vitals Details [name] Date of Service: 04/15/2016 1:30 PM Patient Name: [name] Account Number: [id number] Medical Record Treating RN: [name] [id number] Number: Other Clinician: 1921-01-30 (82 y.o. Treating [name], [name] Date of Birth/Sex: Female) Physician/Extender: G Primary Care FULLER, SUSAN Physician: Referring Physician: [name] Weeks in Treatment: 3 Vital Signs Time Taken: 13:37 Pulse (bpm): 79 Height (in): 68 Respiratory Rate (breaths/min): 16 Weight (lbs): 118 Blood Pressure (mmHg): 128/78 Body Mass Index (BMI): 17.9 Reference Range: 80 - 120 mg / dl Notes Pt refused to have her temperature taken. Electronic Signature(s) Signed: 04/15/2016 5:55:37 PM By: [name] Entered By: [name] on 04/15/2016 13:40:29

## 2016-04-28 ENCOUNTER — Encounter: Payer: Medicare Other | Admitting: Physician Assistant

## 2016-04-28 DIAGNOSIS — L89610 Pressure ulcer of right heel, unstageable: Secondary | ICD-10-CM | POA: Diagnosis not present

## 2016-04-29 NOTE — Progress Notes (Signed)
[name], WIEDERHOLT ([id number]) Visit Report for 04/28/2016 Chief Complaint Document Details [name], [name] 04/28/2016 3:00 Patient Name: Date of Service: [name] Medical Record Patient Account Number: [id number] Number: Treating RN: [name] Date of Birth/Sex: 02/06/1921 ([name]) Other Clinician: Primary Care Physician: Toni Arthurs, SUSAN Treating STONE III, HOYT Referring Physician: [name] Physician/Extender: Weeks in Treatment: 5 Information Obtained from: Patient Chief Complaint Follow-up regarding right lateral calf wounds and right heel pressure injury Electronic Signature(s) Signed: 04/29/2016 9:39:47 AM By: Lenda Kelp PA-C Entered By: Lenda Kelp on 04/28/2016 16:48:06 Fratto, [name] ([id number]) -------------------------------------------------------------------------------- Debridement Details [name], [name] 04/28/2016 3:00 Patient Name: Date of Service: [name] Medical Record Patient Account Number: [id number] Number: Treating RN: [name] Date of Birth/Sex: [birth year] ([name]) Other Clinician: Primary Care Physician: FULLER, SUSAN Treating STONE III, HOYT Referring Physician: [name] Physician/Extender: Weeks in Treatment: 5 Debridement Performed for Wound #4 Right,Medial Calcaneus Assessment: Performed By: Physician STONE III, HOYT, Debridement: Open Wound/Selective Debridement Selective Description: Pre-procedure Yes - 15:40 Verification/Time Out Taken: Start Time: 15:41 Pain Control: Other : lidocaine 4% Level: Non-Viable Tissue Total Area Debrided (L x 2 (cm) x 2.5 (cm) = 5 (cm) W): Tissue and other Non-Viable, Eschar material debrided: Instrument: Forceps, Scissors Bleeding: Minimum Hemostasis Achieved: Pressure End Time: 15:48 Procedural Pain: 2 Post Procedural Pain: 0 Response to Treatment: Procedure was tolerated well Post Debridement Measurements of Total  Wound Length: (cm) 0.5 Stage: Category/Stage II Width: (cm) 2 Depth: (cm) 0.1 Volume: (cm) 0.079 Character of Wound/Ulcer Post Requires Further Debridement: Debridement Severity of Tissue Post Fat layer exposed Debridement: Post Procedure Diagnosis Same as Pre-procedure CHANIA, KOCHANSKI ([id number]) Electronic Signature(s) Signed: 04/29/2016 9:39:47 AM By: Lenda Kelp PA-C Signed: 04/29/2016 11:44:35 AM By: Elliot Gurney, RN, BSN, Kim RN, BSN Entered By: Elliot Gurney, RN, BSN, Kim on 04/28/2016 15:48:48 Abramovich, [name] ([id number]) -------------------------------------------------------------------------------- HPI Details [name], [name] 04/28/2016 3:00 Patient Name: Date of Service: [name] Medical Record Patient Account Number: [id number] Number: Treating RN: [name] Date of Birth/Sex: Sep 15, 1920 ([name]) Other Clinician: Primary Care Physician: [name] Treating STONE III, HOYT Referring Physician: [name] Physician/Extender: Weeks in Treatment: 5 History of Present Illness HPI Description: 03/24/16; this is an elderly 82 year old woman with advanced dementia who was hospitalized from 5/8 through 5/13. At that point she had Proteus sepsis felt to be secondary to a UTI the. Acute kidney injury and delirium. Her son who is her primary caregiver says she came home from the hospital with wounds on her right lower extremity and apparently her right buttock as well. There is no mention on the hospital discharge summary of problems. She has been having [facility] and have been using silver alginate based dressings. Apparently the area on the right buttock is healing and the son stated we did not need to become involved with that. In any case the patient has for wounds to on the right heel and 2 on the posterior lateral right calf, the latter of which is not a usual pressure area however that is the history. She is apparently eating  and drinking fairly well. Takes ensure well. She does not have any pressure-relief surface at home. I have reviewed her lab work from the hospital. Her admission albumin was 3.4 on 5/8 04/01/16; patient arrives with wounds looking much the same as last week. She has an extensive pressure area over her right heel and to small but deep wounds on the lateral aspect  of her right lower leg. These wounds are not connected. Although there are advertised his pressure ulcers these 2 wounds are not usual for pressure ulcerations. We have not looked at the pressure ulceration on her buttock at the request of the patient's son who is the primary caregiver 04/08/16; wounds are stable to improved this week. Culture I did of the lower wound on her right lateral leg grew methicillin sensitive staph aureus she is on Septra. We are using silver alginate to all the wounds. 04/15/16; the 2 small areas on the lateral aspect of this lady's right leg continued to improve however the heel is once again covered with callus, residual alginate, acrotic material all of which requires a difficult debridement. There continues to be purulent material under this surface. This is in spite of a week of Septra that I gave him for MSSA 04/22/16 Patient today presents for follow-up evaluation. She is seen with her son in the office at this point in time she does have Alzheimer's. The good news is her wounds appeared to be somewhat smaller with current therapy. At this point in time the infection which was cultured previously appears to have improved in regard to the right heel wound. There is no purulent drainage at this point in time and a good portion of the wound actually is eschar covered and dry with a medial portion actually open to granulation with very little slough covering. She really has no significant discomfort with palpation manipulation of the wound at this point in time. 04/28/16 patient presents today for follow-up  evaluation concerning her right lateral calf wound as well as right heel wound. she has not really been complaining this for as pain is concerned according to her son seen with her in the office at this point in time today. She seems to show no interval signs or symptoms of infection overall has been tolerating the dressing changes well. She does have Alzheimer's therefore is not able to rate her pain although she does respond with an affirmative that she hurts when I press over the heel region. CHRISTIANN, HAGERTY ([id number]) Electronic Signature(s) Signed: 04/29/2016 9:39:47 AM By: Lenda Kelp PA-C Entered By: Lenda Kelp on 04/28/2016 16:49:29 Mizrachi, [name] ([id number]) -------------------------------------------------------------------------------- Physical Exam Details Dooling, Mry 04/28/2016 3:00 Patient Name: Date of Service: [name] Medical Record Patient Account Number: [id number] Number: Treating RN: [name] Date of Birth/Sex: February 07, 1921 ([name]) Other Clinician: Primary Care Physician: Toni Arthurs, SUSAN Treating STONE III, HOYT Referring Physician: [name] Physician/Extender: Weeks in Treatment: 5 Constitutional Well-nourished and well-hydrated in no acute distress. Respiratory normal breathing without difficulty. Cardiovascular 1+ dorsalis pedis/posterior tibialis pulses. no clubbing, cyanosis, no significant edema, <3 sec cap refill. Psychiatric Patient is not able to cooperate in decision making regarding care. Patient has dementia. patient is confused but cooperative. Electronic Signature(s) Signed: 04/29/2016 9:39:47 AM By: Lenda Kelp PA-C Entered By: Lenda Kelp on 04/28/2016 16:50:17 Gramajo, [name] ([id number]) -------------------------------------------------------------------------------- Physician Orders Details Loewenstein, KAITLYNN 04/28/2016 3:00 Patient Name: Date of Service: L.  PM Medical Record Patient Account Number: [id number] Number: Treating RN: [name] Date of Birth/Sex: 13-Oct-1920 ([name]) Other Clinician: Primary Care Physician: Toni Arthurs, SUSAN Treating STONE III, HOYT Referring Physician: [name] Physician/Extender: Tania Ade in Treatment: 5 Verbal / Phone Orders: Yes Clinician: [name] Read Back and Verified: Yes Diagnosis Coding Wound Cleansing Wound #3 Right,Distal,Lateral Lower Leg o Cleanse wound with mild soap and water o May Shower, gently  pat wound dry prior to applying new dressing. o May shower with protection. Wound #4 Right,Medial Calcaneus o Cleanse wound with mild soap and water o May Shower, gently pat wound dry prior to applying new dressing. o May shower with protection. Anesthetic Wound #3 Right,Distal,Lateral Lower Leg o Topical Lidocaine 4% cream applied to wound bed prior to debridement - for clinic use Wound #4 Right,Medial Calcaneus o Topical Lidocaine 4% cream applied to wound bed prior to debridement - for clinic use Primary Wound Dressing Wound #3 Right,Distal,Lateral Lower Leg o Aquacel Ag Wound #4 Right,Medial Calcaneus o Aquacel Ag Secondary Dressing Wound #3 Right,Distal,Lateral Lower Leg o ABD and Kerlix/Conform Wound #4 Right,Medial Calcaneus o ABD and Kerlix/Conform - tape and stretch netting #4 o Dry Gauze Dressing Change Frequency Bieri, Ovetta L. ([id number]) Wound #3 Right,Distal,Lateral Lower Leg o Change dressing every other day. Wound #4 Right,Medial Calcaneus o Change dressing every other day. Follow-up Appointments Wound #3 Right,Distal,Lateral Lower Leg o Return Appointment in 1 week. Wound #4 Right,Medial Calcaneus o Return Appointment in 1 week. Off-Loading Wound #3 Right,Distal,Lateral Lower Leg o Heel suspension boot to: - SAGE BOOTS o Turn and reposition every 2 hours Wound #4 Right,Medial Calcaneus o Heel  suspension boot to: - SAGE BOOTS o Turn and reposition every 2 hours Additional Orders / Instructions Wound #3 Right,Distal,Lateral Lower Leg o Increase protein intake. - Ensure, Boost o Other: - VItamin C, A, MVI, Zinc Wound #4 Right,Medial Calcaneus o Increase protein intake. - Ensure, Boost o Other: - VItamin C, A, MVI, Zinc Home Health Wound #3 Right,Distal,Lateral Lower Leg o Continue Home Health Visits - [facility] o Home Health Nurse may visit PRN to address patientos wound care needs. o FACE TO FACE ENCOUNTER: MEDICARE and MEDICAID PATIENTS: I certify that this patient is under my care and that I had a face-to-face encounter that meets the physician face-to-face encounter requirements with this patient on this date. The encounter with the patient was in whole or in part for the following MEDICAL CONDITION: (primary reason for Home Healthcare) MEDICAL NECESSITY: I certify, that based on my findings, NURSING services are a medically necessary home health service. HOME BOUND STATUS: I certify that my clinical findings support that this patient is homebound (i.e., Due to illness or injury, pt requires aid of supportive devices such as crutches, cane, wheelchairs, walkers, the use of special transportation or the assistance of another person to leave their place of residence. There is a normal inability to leave the home and doing so requires considerable and taxing effort. Other absences are for medical reasons / religious services and are infrequent or of short duration when for other reasons). ADENIKE, SHIDLER ([id number]) o If current dressing causes regression in wound condition, may D/C ordered dressing product/s and apply Normal Saline Moist Dressing daily until next Wound Healing Center / Other MD appointment. Notify Wound Healing Center of regression in wound condition at [phone]. o Please direct any NON-WOUND related issues/requests for  orders to patient's Primary Care Physician Wound #4 Right,Medial Calcaneus o Continue Home Health Visits - [facility] o Home Health Nurse may visit PRN to address patientos wound care needs. o FACE TO FACE ENCOUNTER: MEDICARE and MEDICAID PATIENTS: I certify that this patient is under my care and that I had a face-to-face encounter that meets the physician face-to-face encounter requirements with this patient on this date. The encounter with the patient was in whole or in part for the following MEDICAL CONDITION: (primary reason  for Home Healthcare) MEDICAL NECESSITY: I certify, that based on my findings, NURSING services are a medically necessary home health service. HOME BOUND STATUS: I certify that my clinical findings support that this patient is homebound (i.e., Due to illness or injury, pt requires aid of supportive devices such as crutches, cane, wheelchairs, walkers, the use of special transportation or the assistance of another person to leave their place of residence. There is a normal inability to leave the home and doing so requires considerable and taxing effort. Other absences are for medical reasons / religious services and are infrequent or of short duration when for other reasons). o If current dressing causes regression in wound condition, may D/C ordered dressing product/s and apply Normal Saline Moist Dressing daily until next Wound Healing Center / Other MD appointment. Notify Wound Healing Center of regression in wound condition at [phone]. o Please direct any NON-WOUND related issues/requests for orders to patient's Primary Care Physician Medications-please add to medication list. Wound #3 Right,Distal,Lateral Lower Leg o P.O. Antibiotics - Cefadroxil Wound #4 Right,Medial Calcaneus o P.O. Antibiotics - Cefadroxil Electronic Signature(s) Signed: 04/29/2016 9:39:47 AM By: Lenda Kelp PA-C Signed: 04/29/2016 11:44:35 AM By: Elliot Gurney, RN,  BSN, Kim RN, BSN Entered By: Elliot Gurney, RN, BSN, Kim on 04/28/2016 15:49:58 Denn, [name] ([id number]) -------------------------------------------------------------------------------- Problem List Details MICA, RAMDASS 04/28/2016 3:00 Patient Name: Date of Service: [name] Medical Record Patient Account Number: [id number] Number: Treating RN: [name] Date of Birth/Sex: 15-Feb-1921 (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 Medical Record Patient Account Number: [id number] Number: Treating RN: [name] Date of Birth/Sex: September 17, 1920 ([name]) Other Clinician: Primary Care Physician: [name] Treating STONE III, HOYT Referring Physician: [name] Physician/Extender: Weeks in Treatment: 5 Subjective Chief Complaint Information obtained from Patient Follow-up regarding right lateral calf wounds and right heel pressure injury History of Present Illness (HPI) 03/24/16; this is an elderly 82 year old woman with advanced dementia who was  hospitalized from 5/8 through 5/13. At that point she had Proteus sepsis felt to be secondary to a UTI the. Acute kidney injury and delirium. Her son who is her primary caregiver says she came home from the hospital with wounds on her right lower extremity and apparently her right buttock as well. There is no mention on the hospital discharge summary of problems. She has been having [facility] and have been using silver alginate based dressings. Apparently the area on the right buttock is healing and the son stated we did not need to become involved with that. In any case the patient has for wounds to on the right heel and 2 on the posterior lateral right calf, the latter of which is not a usual pressure area however that is the history. She is apparently eating and drinking fairly well. Takes ensure well. She does not have  any pressure-relief surface at home. I have reviewed her lab work from the hospital. Her admission albumin was 3.4 on 5/8 04/01/16; patient arrives with wounds looking much the same as last week. She has an extensive pressure area over her right heel and to small but deep wounds on the lateral aspect of her right lower leg. These wounds are not connected. Although there are advertised his pressure ulcers these 2 wounds are not usual for pressure ulcerations. We have not looked at the pressure ulceration on her buttock at the request of the patient's son who is the primary caregiver 04/08/16; wounds are stable to improved this week. Culture I did of the lower wound on her right lateral leg grew methicillin sensitive staph aureus she is on Septra. We are using silver alginate to all the wounds. 04/15/16; the 2 small areas on the lateral aspect of this lady's right leg continued to improve however the heel is once again covered with callus, residual alginate, acrotic material all of which requires a difficult debridement. There continues to be purulent material under this  surface. This is in spite of a week of Septra that I gave him for MSSA 04/22/16 Patient today presents for follow-up evaluation. She is seen with her son in the office at this point in time she does have Alzheimer's. The good news is her wounds appeared to be somewhat smaller with current therapy. At this point in time the infection which was cultured previously appears to have improved in regard to the right heel wound. There is no purulent drainage at this point in time and a good portion of the wound actually is eschar covered and dry with a medial portion actually open to granulation with very little slough covering. She really has no significant discomfort with palpation manipulation of the wound at this point in time. AMARRA, SAWYER ([id number]) 04/28/16 patient presents today for follow-up evaluation concerning her right lateral calf wound as well as right heel wound. she has not really been complaining this for as pain is concerned according to her son seen with her in the office at this point in time today. She seems to show no interval signs or symptoms of infection overall has been tolerating the dressing changes well. She does have Alzheimer's therefore is not able to rate her pain although she does respond with an affirmative that she hurts when I press over the heel region. Objective Constitutional Well-nourished and well-hydrated in no acute distress. Vitals Time Taken: 3:23 PM, Height: 68 in, Weight: 118 lbs, BMI: 17.9, Pulse: 88 bpm, Respiratory Rate: 16 breaths/min, Blood Pressure: 140/78 mmHg. Respiratory normal breathing without difficulty. Cardiovascular 1+ dorsalis pedis/posterior tibialis pulses. no clubbing, cyanosis, no significant edema, Psychiatric Patient is not able to cooperate in decision making regarding care. Patient has dementia. patient is confused but cooperative. Integumentary (Hair, Skin) Wound #2 status is Healed - Epithelialized. Original  cause of wound was Pressure Injury. The wound is located on the Right,Proximal,Lateral Lower Leg. The wound measures 0cm length x 0cm width x 0cm depth; 0cm^2 area and 0cm^3 volume. Wound #3 status is Open. Original cause of wound was Pressure Injury. The wound is located on the Right,Distal,Lateral Lower Leg. The wound measures 0.4cm length x 0.2cm width x 0.1cm depth; 0.063cm^2 area and 0.006cm^3 volume. The wound is limited to skin breakdown. There is no tunneling noted. There is a medium amount of serous drainage noted. The wound margin is flat and intact. There is small (1-33%) pink granulation within  the wound bed. There is a large (67-100%) amount of necrotic tissue within the wound bed including Adherent Slough. The periwound skin appearance exhibited: Scarring. The periwound skin appearance did not exhibit: Callus, Crepitus, Excoriation, Fluctuance, Friable, Induration, Localized Edema, Rash, Dry/Scaly, Maceration, Moist, Atrophie Blanche, Cyanosis, Ecchymosis, Hemosiderin Staining, Mottled, Pallor, Rubor, Erythema. Wound #4 status is Open. Original cause of wound was Pressure Injury. The wound is located on the Right,Medial Calcaneus. The wound measures 2cm length x 2cm width x 0.1cm depth; 3.142cm^2 area and 0.314cm^3 volume. The wound is limited to skin breakdown. There is no tunneling or undermining noted. Obryant, Evyn L. ([id number]) There is a none present amount of drainage noted. The wound margin is indistinct and nonvisible. There is no granulation within the wound bed. There is a large (67-100%) amount of necrotic tissue within the wound bed including Eschar. The periwound skin appearance exhibited: Dry/Scaly. The periwound skin appearance did not exhibit: Callus, Crepitus, Excoriation, Fluctuance, Friable, Induration, Localized Edema, Rash, Scarring, Maceration, Moist, Atrophie Blanche, Cyanosis, Ecchymosis, Hemosiderin Staining, Mottled, Pallor, Rubor,  Erythema. Assessment Active Problems ICD-10 L89.610 - Pressure ulcer of right heel, unstageable L97.212 - Non-pressure chronic ulcer of right calf with fat layer exposed G30.9 - Alzheimer's disease, unspecified Procedures Wound #4 Wound #4 is a Pressure Ulcer located on the Right,Medial Calcaneus . There was a Non-Viable Tissue Open Wound/Selective [phone]) debridement with total area of 5 sq cm performed by STONE III, HOYT. with the following instrument(s): Forceps and Scissors to remove Non-Viable tissue/material including Eschar after achieving pain control using Other (lidocaine 4%). A time out was conducted at 15:40, prior to the start of the procedure. A Minimum amount of bleeding was controlled with Pressure. The procedure was tolerated well with a pain level of 2 throughout and a pain level of 0 following the procedure. Post Debridement Measurements: 0.5cm length x 2cm width x 0.1cm depth; 0.079cm^3 volume. Post debridement Stage noted as Category/Stage II. Character of Wound/Ulcer Post Debridement requires further debridement. Severity of Tissue Post Debridement is: Fat layer exposed. Post procedure Diagnosis Wound #4: Same as Pre-Procedure Plan Wound Cleansing: Wound #3 Right,Distal,Lateral Lower Leg: Cleanse wound with mild soap and water Coralee RudSUMMERS, Yanelly L. ([id number]) May Shower, gently pat wound dry prior to applying new dressing. May shower with protection. Wound #4 Right,Medial Calcaneus: Cleanse wound with mild soap and water May Shower, gently pat wound dry prior to applying new dressing. May shower with protection. Anesthetic: Wound #3 Right,Distal,Lateral Lower Leg: Topical Lidocaine 4% cream applied to wound bed prior to debridement - for clinic use Wound #4 Right,Medial Calcaneus: Topical Lidocaine 4% cream applied to wound bed prior to debridement - for clinic use Primary Wound Dressing: Wound #3 Right,Distal,Lateral Lower Leg: Aquacel Ag Wound #4  Right,Medial Calcaneus: Aquacel Ag Secondary Dressing: Wound #3 Right,Distal,Lateral Lower Leg: ABD and Kerlix/Conform Wound #4 Right,Medial Calcaneus: ABD and Kerlix/Conform - tape and stretch netting #4 Dry Gauze Dressing Change Frequency: Wound #3 Right,Distal,Lateral Lower Leg: Change dressing every other day. Wound #4 Right,Medial Calcaneus: Change dressing every other day. Follow-up Appointments: Wound #3 Right,Distal,Lateral Lower Leg: Return Appointment in 1 week. Wound #4 Right,Medial Calcaneus: Return Appointment in 1 week. Off-Loading: Wound #3 Right,Distal,Lateral Lower Leg: Heel suspension boot to: - SAGE BOOTS Turn and reposition every 2 hours Wound #4 Right,Medial Calcaneus: Heel suspension boot to: - SAGE BOOTS Turn and reposition every 2 hours Additional Orders / Instructions: Wound #3 Right,Distal,Lateral Lower Leg: Increase protein intake. - Ensure, Boost Other: - VItamin C, A, MVI,  Zinc Wound #4 Right,Medial Calcaneus: Increase protein intake. - Ensure, Boost Other: - V[facility]amin C, A, [facility], [facility]: Wound #3 Right,Distal,Lateral Lower Leg: Continue Home Health Visits - [facility] Home Health Nurse may visit PRN to address patient s wound care needs. FACE TO FACE ENCOUNTER: MEDICARE and MEDICAID PATIENTS: I certify that this patient is under CARLINDA, [place]. ([id number]) my care and that I had a face-to-face encounter that meets the physician face-to-face encounter requirements with this patient on this date. The encounter with the patient was in whole or in part for the following MEDICAL CONDITION: (primary reason for Home Healthcare) MEDICAL NECESSITY: I certify, that based on my findings, NURSING services are a medically necessary home health service. HOME BOUND STATUS: I certify that my clinical findings support that this patient is homebound (i.e., Due to illness or injury, pt requires aid of supportive devices such as crutches,  cane, wheelchairs, walkers, the use of special transportation or the assistance of another person to leave their place of residence. There is a normal inability to leave the home and doing so requires considerable and taxing effort. Other absences are for medical reasons / religious services and are infrequent or of short duration when for other reasons). If current dressing causes regression in wound condition, may D/C ordered dressing product/s and apply Normal Saline Moist Dressing daily until next Wound Healing Center / Other MD appointment. Notify Wound Healing Center of regression in wound condition at [phone]. Please direct any NON-WOUND related issues/requests for orders to patient's Primary Care Physician Wound #4 Right,Medial Calcaneus: Continue Home Health Visits - [facility] Home Health Nurse may visit PRN to address patient s wound care needs. FACE TO FACE ENCOUNTER: MEDICARE and MEDICAID PATIENTS: I certify that this patient is under my care and that I had a face-to-face encounter that meets the physician face-to-face encounter requirements with this patient on this date. The encounter with the patient was in whole or in part for the following MEDICAL CONDITION: (primary reason for Home Healthcare) MEDICAL NECESSITY: I certify, that based on my findings, NURSING services are a medically necessary home health service. HOME BOUND STATUS: I certify that my clinical findings support that this patient is homebound (i.e., Due to illness or injury, pt requires aid of supportive devices such as crutches, cane, wheelchairs, walkers, the use of special transportation or the assistance of another person to leave their place of residence. There is a normal inability to leave the home and doing so requires considerable and taxing effort. Other absences are for medical reasons / religious services and are infrequent or of short duration when for other reasons). If current dressing  causes regression in wound condition, may D/C ordered dressing product/s and apply Normal Saline Moist Dressing daily until next Wound Healing Center / Other MD appointment. Notify Wound Healing Center of regression in wound condition at [phone]. Please direct any NON-WOUND related issues/requests for orders to patient's Primary Care Physician Medications-please add to medication list.: Wound #3 Right,Distal,Lateral Lower Leg: P.O. Antibiotics - Cefadroxil Wound #4 Right,Medial Calcaneus: P.O. Antibiotics - Cefadroxil Follow-Up Appointments: A follow-up appointment should be scheduled. Medication Reconciliation completed and provided to Patient/Care Provider. A Patient Clinical Summary of Care was provided to ES At this point in time I feel like that she has been doing very well as far as dressing changes are concerned. I was able to selectively debride her wound of what appeared to be eschar over the right heel although this Check,  Antoinetta L. ([id number]) apparently was meshed somewhat with some of the alginate dressing. Once this was removed she had a much smaller region of opening more medial on the heel at this point in time. Overall this looked dramatically better than could be noted with the eschar present. Subsequently on the recommend that we continue at this point in time with the Aquacel Ag over the heel as well as the lateral portion of her calf on the right. We will see her for reevaluation in one week but I'm extremely pleased with the way this looks at this point in time. No sharp debridement was even necessary. Electronic Signature(s) Signed: 04/29/2016 9:39:47 AM By: Lenda Kelp PA-C Entered By: Lenda Kelp on 04/28/2016 16:52:40 Quiocho, [name] ([id number]) -------------------------------------------------------------------------------- SuperBill Details Patient Name: [name] L. Date of Service: 04/28/2016 Medical Record Number:  [id number] Patient Account Number: [id number] Date of Birth/Sex: [birth year] ([name]) Treating RN: Ashok Cordia, Debi Primary Care Physician: [name] Other Clinician: Referring Physician: [name] Treating Physician/Extender: Linwood Dibbles, HOYT Weeks in Treatment: 5 Diagnosis Coding ICD-10 Codes Code Description L89.610 Pressure ulcer of right heel, unstageable L97.212 Non-pressure chronic ulcer of right calf with fat layer exposed G30.9 Alzheimer's disease, unspecified Facility Procedures CPT4 Code: [id number] Description: [phone] - DEBRIDE WOUND 1ST 20 SQ CM OR < ICD-10 Description Diagnosis L89.610 Pressure ulcer of right heel, unstageable L97.212 Non-pressure chronic ulcer of right calf with fat l G30.9 Alzheimer's disease, unspecified Modifier: ayer exposed Quantity: 1 Physician Procedures CPT4 Code: [id number] Description: 97597 - WC PHYS DEBR WO ANESTH 20 SQ CM ICD-10 Description Diagnosis L89.610 Pressure ulcer of right heel, unstageable L97.212 Non-pressure chronic ulcer of right calf with fat l G30.9 Alzheimer's disease, unspecified Modifier: ayer exposed Quantity: 1 Electronic Signature(s) Signed: 04/29/2016 9:39:47 AM By: Lenda Kelp PA-C Entered By: Lenda Kelp on 04/28/2016 16:54:56

## 2016-04-29 NOTE — Progress Notes (Signed)
TARRIE, MCMICHEN ([id number]) Visit Report for 04/28/2016 Arrival Information Details Patient Name: [name], [name]. Date of Service: 04/28/2016 3:00 PM Medical Record Number: [id number] Patient Account Number: [id number] Date of Birth/Sex: 1920-12-03 ([name]) Treating RN: [name] Primary Care Physician: [name] Other Clinician: Referring Physician: [name] Treating Physician/Extender: Linwood Dibbles, HOYT Weeks in Treatment: 5 Visit Information History Since Last Visit Added or deleted any medications: No Patient Arrived: Wheel Chair Any new allergies or adverse No reactions: Arrival Time: 15:21 Had a fall or experienced change in No Accompanied By: son activities of daily living that may Transfer Assistance: None affect Patient Identification Verified: Yes risk of falls: Secondary Verification Process Yes Signs or symptoms of abuse/neglect No Completed: since last visito Patient Requires Transmission-Based No Hospitalized since last visit: No Precautions: Has Dressing in Place as Yes Patient Has Alerts: No Prescribed: Pain Present Now: Unable to Respond Electronic Signature(s) Signed: 04/29/2016 11:44:35 AM By: Elliot Gurney, RN, BSN, Kim RN, BSN Entered By: Elliot Gurney, RN, BSN, Kim on 04/28/2016 15:21:58 [name] ([id number]) -------------------------------------------------------------------------------- Encounter Discharge Information Details Patient Name: [name], [name] Date of Service: 04/28/2016 3:00 PM Medical Record Number: [id number] Patient Account Number: [id number] Date of Birth/Sex: 1920-12-18 ([name]) Treating RN: [name] Primary Care Physician: [name] Other Clinician: Referring Physician: [name] Treating Physician/Extender: Linwood Dibbles, HOYT Weeks in Treatment: 5 Encounter Discharge Information Items Discharge Pain Level: 0 Discharge Condition: Stable Ambulatory Status: Wheelchair Discharge  Destination: Home Transportation: Private Auto Accompanied By: son Schedule Follow-up Appointment: Yes Medication Reconciliation completed and provided to Patient/Care Yes Tammie Yanda: Provided on Clinical Summary of Care: 04/28/2016 Form Type Recipient Paper Patient ES Electronic Signature(s) Signed: 04/28/2016 3:58:16 PM By: Gwenlyn Perking Entered By: Gwenlyn Perking on 04/28/2016 15:58:15 [name] ([id number]) -------------------------------------------------------------------------------- Lower Extremity Assessment Details Patient Name: [name], [name] Date of Service: 04/28/2016 3:00 PM Medical Record Number: [id number] Patient Account Number: [id number] Date of Birth/Sex: 11-17-1920 ([name]) Treating RN: [name] Primary Care Physician: [name] Other Clinician: Referring Physician: [name] Treating Physician/Extender: Linwood Dibbles, HOYT Weeks in Treatment: 5 Vascular Assessment Pulses: Posterior Tibial Dorsalis Pedis Palpable: [Right:Yes] Extremity colors, hair growth, and conditions: Extremity Color: [Right:Normal] Hair Growth on Extremity: [Right:No] Temperature of Extremity: [Right:Warm] Capillary Refill: [Right:> 3 seconds] Dependent Rubor: [Right:No] Blanched when Elevated: [Right:No] Lipodermatosclerosis: [Right:No] Toe Nail Assessment Left: Right: Thick: Yes Discolored: Yes Deformed: Yes Improper Length and Hygiene: Yes Electronic Signature(s) Signed: 04/29/2016 11:44:35 AM By: Elliot Gurney, RN, BSN, Kim RN, BSN Entered By: Elliot Gurney, RN, BSN, Kim on 04/28/2016 15:29:33 [name] ([id number]) -------------------------------------------------------------------------------- Multi Wound Chart Details Patient Name: [name], [name] Date of Service: 04/28/2016 3:00 PM Medical Record Number: [id number] Patient Account Number: [id number] Date of Birth/Sex: 1921/02/15 ([name]) Treating RN: [name] Primary Care  Physician: [name] Other Clinician: Referring Physician: [name] Treating Physician/Extender: Linwood Dibbles, HOYT Weeks in Treatment: 5 Vital Signs Height(in): 68 Pulse(bpm): 88 Weight(lbs): 118 Blood Pressure 140/78 (mmHg): Body Mass Index(BMI): 18 Temperature(F): Respiratory Rate 16 (breaths/min): Photos: Wound Location: Right Lower Leg - Lateral, Right Lower Leg - Lateral, Right Calcaneus - Medial Proximal Distal Wounding Event: Pressure Injury Pressure Injury Pressure Injury Primary Etiology: Pressure Ulcer Pressure Ulcer Pressure Ulcer Comorbid History: Cataracts, Anemia, Cataracts, Anemia, Cataracts, Anemia, Hypertension, History of Hypertension, History of Hypertension, History of pressure wounds, pressure wounds, pressure wounds, Osteoarthritis, Dementia Osteoarthritis, Dementia Osteoarthritis, Dementia Date Acquired: 01/02/2016 01/02/2016 01/02/2016 Weeks of Treatment: 5 5 5  Wound Status: Healed -  Epithelialized Open Open Measurements L x W x D 0x0x0 0.4x0.2x0.1 2x2x0.1 (cm) Area (cm) : 0 0.063 3.142 Volume (cm) : 0 0.006 0.314 % Reduction in Area: 100.00% 98.40% -100.00% % Reduction in Volume: 100.00% 99.80% -100.00% Classification: Category/Stage IV Category/Stage III Category/Stage II Exudate Amount: N/A Medium None Present Exudate Type: N/A Serous N/A Exudate Color: N/A amber N/A Wound Margin: N/A Flat and Intact Indistinct, nonvisible Granulation Amount: N/A Small (1-33%) None Present (0%) Granulation Quality: N/A Pink N/A Lafond, [name] ([id number]) Necrotic Amount: N/A Large (67-100%) Large (67-100%) Necrotic Tissue: N/A Adherent Slough Eschar Epithelialization: N/A Small (1-33%) Small (1-33%) Periwound Skin Texture: No Abnormalities Noted Scarring: Yes Edema: No Edema: No Excoriation: No Excoriation: No Induration: No Induration: No Callus: No Callus: No Crepitus: No Crepitus: No Fluctuance: No Fluctuance: No Friable:  No Friable: No Rash: No Rash: No Scarring: No Periwound Skin No Abnormalities Noted Maceration: No Dry/Scaly: Yes Moisture: Moist: No Maceration: No Dry/Scaly: No Moist: No Periwound Skin Color: No Abnormalities Noted Atrophie Blanche: No Atrophie Blanche: No Cyanosis: No Cyanosis: No Ecchymosis: No Ecchymosis: No Erythema: No Erythema: No Hemosiderin Staining: No Hemosiderin Staining: No Mottled: No Mottled: No Pallor: No Pallor: No Rubor: No Rubor: No Tenderness on No No No Palpation: Wound Preparation: N/A Ulcer Cleansing: Ulcer Cleansing: Rinsed/Irrigated with Rinsed/Irrigated with Saline Saline Topical Anesthetic Topical Anesthetic Applied: Other: lidocaine Applied: Other: lidocaine 4% 4% Treatment Notes Electronic Signature(s) Signed: 04/29/2016 11:44:35 AM By: Elliot GurneyWoody, RN, BSN, Kim RN, BSN Entered By: Elliot GurneyWoody, RN, BSN, Kim on 04/28/2016 15:37:26 Storrs, [name] SeatsELIZABETH L. ([id number]) -------------------------------------------------------------------------------- Multi-Disciplinary Care Plan Details Patient Name: [name], [name] Date of Service: 04/28/2016 3:00 PM Medical Record Number: [id number] Patient Account Number: [id number] Date of Birth/Sex: 10-Dec-1920 ([name]) Treating RN: Huel CoventryWoody, Kim Primary Care Physician: Tomi BambergerFULLER, SUSAN Other Clinician: Referring Physician: Tomi BambergerFULLER, SUSAN Treating Physician/Extender: Linwood DibblesSTONE III, HOYT Weeks in Treatment: 5 Active Inactive Orientation to the Wound Care Program Nursing Diagnoses: Knowledge deficit related to the wound healing center program Goals: Patient/caregiver will verbalize understanding of the Wound Healing Center Program Date Initiated: 03/24/2016 Goal Status: Active Interventions: Provide education on orientation to the wound center Notes: Pressure Nursing Diagnoses: Knowledge deficit related to causes and risk factors for pressure ulcer development Knowledge deficit related to management of  pressures ulcers Potential for impaired tissue integrity related to pressure, friction, moisture, and shear Goals: Patient will remain free from development of additional pressure ulcers Date Initiated: 03/24/2016 Goal Status: Active Patient will remain free of pressure ulcers Date Initiated: 03/24/2016 Goal Status: Active Patient/caregiver will verbalize risk factors for pressure ulcer development Date Initiated: 03/24/2016 Goal Status: Active Patient/caregiver will verbalize understanding of pressure ulcer management Date Initiated: 03/24/2016 Goal Status: Active Interventions: Coralee RudSUMMERS, Nadea L. ([id number]) Assess: immobility, friction, shearing, incontinence upon admission and as needed Assess offloading mechanisms upon admission and as needed Assess potential for pressure ulcer upon admission and as needed Provide education on pressure ulcers Treatment Activities: Patient referred for home evaluation of offloading devices/mattresses : 03/24/2016 Patient referred for pressure reduction/relief devices : 03/24/2016 Patient referred for seating evaluation to ensure proper offloading : 03/24/2016 Pressure reduction/relief device ordered : 03/24/2016 Notes: Wound/Skin Impairment Nursing Diagnoses: Impaired tissue integrity Knowledge deficit related to smoking impact on wound healing Knowledge deficit related to ulceration/compromised skin integrity Goals: Patient/caregiver will verbalize understanding of skin care regimen Date Initiated: 03/24/2016 Goal Status: Active Ulcer/skin breakdown will have a volume reduction of 30% by week 4 Date Initiated: 03/24/2016 Goal Status: Active Ulcer/skin breakdown will have  a volume reduction of 50% by week 8 Date Initiated: 03/24/2016 Goal Status: Active Ulcer/skin breakdown will have a volume reduction of 80% by week 12 Date Initiated: 03/24/2016 Goal Status: Active Ulcer/skin breakdown will heal within 14 weeks Date Initiated: 03/24/2016 Goal Status:  Active Interventions: Assess patient/caregiver ability to obtain necessary supplies Assess patient/caregiver ability to perform ulcer/skin care regimen upon admission and as needed Assess ulceration(s) every visit Provide education on ulcer and skin care Treatment Activities: Patient referred to home care : 03/24/2016 HELYNE, GENTHER ([id number]) Skin care regimen initiated : 03/24/2016 Topical wound management initiated : 03/24/2016 Notes: Electronic Signature(s) Signed: 04/29/2016 11:44:35 AM By: Elliot Gurney, RN, BSN, Kim RN, BSN Entered By: Elliot Gurney, RN, BSN, Kim on 04/28/2016 15:37:19 Muradyan, [name] ([id number]) -------------------------------------------------------------------------------- Pain Assessment Details Patient Name: [name], [name] Date of Service: 04/28/2016 3:00 PM Medical Record Number: [id number] Patient Account Number: [id number] Date of Birth/Sex: 10-01-1920 ([name]) Treating RN: [name] Primary Care Physician: [name] Other Clinician: Referring Physician: [name] Treating Physician/Extender: Linwood Dibbles, HOYT Weeks in Treatment: 5 Active Problems Location of Pain Severity and Description of Pain Patient Has Paino Patient Unable to Respond Site Locations Pain Management and Medication Current Pain Management: Electronic Signature(s) Signed: 04/29/2016 11:44:35 AM By: Elliot Gurney, RN, BSN, Kim RN, BSN Entered By: Elliot Gurney, RN, BSN, Kim on 04/28/2016 15:22:12 Tetzlaff, [name] ([id number]) -------------------------------------------------------------------------------- Patient/Caregiver Education Details Patient Name: [name] Babe L. Date of Service: 04/28/2016 3:00 PM Medical Record Number: [id number] Patient Account Number: [id number] Date of Birth/Gender: 1921/06/30 ([name]) Treating RN: [name] Primary Care Physician: [name] Other Clinician: Referring Physician: [name] Treating Physician/Extender:  Skeet Simmer in Treatment: 5 Education Assessment Education Provided To: Patient Education Topics Provided Wound/Skin Impairment: Handouts: Caring for Your Ulcer, Other: wound care as prescribed Methods: Demonstration, Explain/Verbal Responses: State content correctly Electronic Signature(s) Signed: 04/29/2016 11:44:35 AM By: Elliot Gurney, RN, BSN, Kim RN, BSN Entered By: Elliot Gurney, RN, BSN, Kim on 04/28/2016 15:57:42 Naser, [name] ([id number]) -------------------------------------------------------------------------------- Wound Assessment Details Patient Name: [name], [name] Date of Service: 04/28/2016 3:00 PM Medical Record Number: [id number] Patient Account Number: [id number] Date of Birth/Sex: 10/05/1920 ([name]) Treating RN: [name] Primary Care Physician: [name] Other Clinician: Referring Physician: [name] Treating Physician/Extender: Linwood Dibbles, HOYT Weeks in Treatment: 5 Wound Status Wound Number: 2 Primary Pressure Ulcer Etiology: Wound Location: Right Lower Leg - Lateral, Proximal Wound Healed - Epithelialized Status: Wounding Event: Pressure Injury Comorbid Cataracts, Anemia, Hypertension, Date Acquired: 01/02/2016 History: History of pressure wounds, Weeks Of Treatment: 5 Osteoarthritis, Dementia Clustered Wound: No Photos Wound Measurements Length: (cm) 0 % Reduction Width: (cm) 0 % Reduction Depth: (cm) 0 Area: (cm) 0 Volume: (cm) 0 in Area: 100% in Volume: 100% Wound Description Classification: Category/Stage IV Periwound Skin Texture Texture Color No Abnormalities Noted: No No Abnormalities Noted: No Moisture No Abnormalities Noted: No Electronic Signature(s) Signed: 04/29/2016 11:44:35 AM By: Elliot Gurney, RN, BSN, Kim RN, BSN Entered By: Elliot Gurney, RN, BSN, Kim on 04/28/2016 15:34:55 Beagle, [name] ([id number]) -------------------------------------------------------------------------------- Wound  Assessment Details Patient Name: [name], [name] Date of Service: 04/28/2016 3:00 PM Medical Record Number: [id number] Patient Account Number: [id number] Date of Birth/Sex: 10/10/1920 ([name]) Treating RN: [name] Primary Care Physician: [name] Other Clinician: Referring Physician: [name] Treating Physician/Extender: Linwood Dibbles, HOYT Weeks in Treatment: 5 Wound Status Wound Number: 3 Primary Pressure Ulcer Etiology: Wound Location: Right Lower Leg - Lateral, Distal Wound Open Status: Wounding Event:  Pressure Injury Comorbid Cataracts, Anemia, Hypertension, Date Acquired: 01/02/2016 History: History of pressure wounds, Weeks Of Treatment: 5 Osteoarthritis, Dementia Clustered Wound: No Photos Wound Measurements Length: (cm) 0.4 Width: (cm) 0.2 Depth: (cm) 0.1 Area: (cm) 0.063 Volume: (cm) 0.006 % Reduction in Area: 98.4% % Reduction in Volume: 99.8% Epithelialization: Small (1-33%) Tunneling: No Wound Description Classification: Category/Stage III Wound Margin: Flat and Intact Exudate Amount: Medium Exudate Type: Serous Exudate Color: amber Foul Odor After Cleansing: No Wound Bed Granulation Amount: Small (1-33%) Exposed Structure Granulation Quality: Pink Fascia Exposed: No Necrotic Amount: Large (67-100%) Fat Layer Exposed: No Necrotic Quality: Adherent Slough Tendon Exposed: No Muscle Exposed: No Joint Exposed: No Bone Exposed: No Detert, Drishti L. ([id number]) Limited to Skin Breakdown Periwound Skin Texture Texture Color No Abnormalities Noted: No No Abnormalities Noted: No Callus: No Atrophie Blanche: No Crepitus: No Cyanosis: No Excoriation: No Ecchymosis: No Fluctuance: No Erythema: No Friable: No Hemosiderin Staining: No Induration: No Mottled: No Localized Edema: No Pallor: No Rash: No Rubor: No Scarring: Yes Moisture No Abnormalities Noted: No Dry / Scaly: No Maceration: No Moist: No Wound  Preparation Ulcer Cleansing: Rinsed/Irrigated with Saline Topical Anesthetic Applied: Other: lidocaine 4%, Treatment Notes Wound #3 (Right, Distal, Lateral Lower Leg) 1. Cleansed with: Clean wound with Normal Saline 2. Anesthetic Topical Lidocaine 4% cream to wound bed prior to debridement 4. Dressing Applied: Aquacel Ag 5. Secondary Dressing Applied ABD Pad Kerlix/Conform 7. Secured with Tape Notes netting, heel cup Electronic Signature(s) Signed: 04/29/2016 11:44:35 AM By: Elliot Gurney, RN, BSN, Kim RN, BSN Entered By: Elliot Gurney, RN, BSN, Kim on 04/28/2016 15:34:27 Bartmess, [name] ([id number]) -------------------------------------------------------------------------------- Wound Assessment Details Patient Name: [name], [name] Date of Service: 04/28/2016 3:00 PM Medical Record Number: [id number] Patient Account Number: [id number] Date of Birth/Sex: July 17, 1921 ([name]) Treating RN: [name] Primary Care Physician: [name] Other Clinician: Referring Physician: [name] Treating Physician/Extender: Linwood Dibbles, HOYT Weeks in Treatment: 5 Wound Status Wound Number: 4 Primary Pressure Ulcer Etiology: Wound Location: Right Calcaneus - Medial Wound Open Wounding Event: Pressure Injury Status: Date Acquired: 01/02/2016 Comorbid Cataracts, Anemia, Hypertension, Weeks Of Treatment: 5 History: History of pressure wounds, Clustered Wound: No Osteoarthritis, Dementia Photos Wound Measurements Length: (cm) 2 Width: (cm) 2 Depth: (cm) 0.1 Area: (cm) 3.142 Volume: (cm) 0.314 % Reduction in Area: -100% % Reduction in Volume: -100% Epithelialization: Small (1-33%) Tunneling: No Undermining: No Wound Description Classification: Category/Stage II Wound Margin: Indistinct, nonvisible Exudate Amount: None Present Foul Odor After Cleansing: No Wound Bed Granulation Amount: None Present (0%) Exposed Structure Necrotic Amount: Large (67-100%) Fascia  Exposed: No Necrotic Quality: Eschar Fat Layer Exposed: No Tendon Exposed: No Muscle Exposed: No Joint Exposed: No Bone Exposed: No Limited to Skin Breakdown [name], [name] ([id number]) Periwound Skin Texture Texture Color No Abnormalities Noted: No No Abnormalities Noted: No Callus: No Atrophie Blanche: No Crepitus: No Cyanosis: No Excoriation: No Ecchymosis: No Fluctuance: No Erythema: No Friable: No Hemosiderin Staining: No Induration: No Mottled: No Localized Edema: No Pallor: No Rash: No Rubor: No Scarring: No Moisture No Abnormalities Noted: No Dry / Scaly: Yes Maceration: No Moist: No Wound Preparation Ulcer Cleansing: Rinsed/Irrigated with Saline Topical Anesthetic Applied: Other: lidocaine 4%, Treatment Notes Wound #4 (Right, Medial Calcaneus) 1. Cleansed with: Clean wound with Normal Saline 2. Anesthetic Topical Lidocaine 4% cream to wound bed prior to debridement 4. Dressing Applied: Aquacel Ag 5. Secondary Dressing Applied ABD Pad Kerlix/Conform 7. Secured with Tape Notes netting, heel cup Electronic Signature(s) Signed: 04/29/2016 11:44:35  AM By: Elliot Gurney, RN, BSN, Kim RN, BSN Entered By: Elliot Gurney, RN, BSN, Kim on 04/28/2016 15:33:05 [name], [name] ([id number]) -------------------------------------------------------------------------------- Vitals Details Patient Name: [name], [name] Date of Service: 04/28/2016 3:00 PM Medical Record Number: [id number] Patient Account Number: [id number] Date of Birth/Sex: 10-Aug-1920 ([name]) Treating RN: [name] Primary Care Physician: [name] Other Clinician: Referring Physician: [name] Treating Physician/Extender: Linwood Dibbles, HOYT Weeks in Treatment: 5 Vital Signs Time Taken: 15:23 Pulse (bpm): 88 Height (in): 68 Respiratory Rate (breaths/min): 16 Weight (lbs): 118 Blood Pressure (mmHg): 140/78 Body Mass Index (BMI): 17.9 Reference Range: 80 - 120 mg /  dl Electronic Signature(s) Signed: 04/29/2016 11:44:35 AM By: Elliot Gurney, RN, BSN, Kim RN, BSN Entered By: Elliot Gurney, RN, BSN, Kim on 04/28/2016 15:23:47

## 2016-05-06 ENCOUNTER — Encounter: Payer: Medicare Other | Admitting: Internal Medicine

## 2016-05-06 DIAGNOSIS — L89610 Pressure ulcer of right heel, unstageable: Secondary | ICD-10-CM | POA: Diagnosis not present

## 2016-05-07 NOTE — Progress Notes (Signed)
[name], [name] ([id number]) Visit Report for 05/06/2016 Arrival Information Details [name], [name] Date of Service: 05/06/2016 1:30 PM Patient Name: [name] Account Number: [id number] Medical Record Treating RN: Curtis SitesDorthy, Joanna [id number] Number: Other Clinician: 30-Dec-1920 (82 y.o. Treating [name], [name] Date of Birth/Sex: Female) Physician/Extender: G Primary Care FULLER, SUSAN Physician: Referring Physician: Meliton RattanFULLER, SUSAN Weeks in Treatment: 6 Visit Information History Since Last Visit Added or deleted any medications: No Patient Arrived: Wheel Chair Any new allergies or adverse reactions: No Arrival Time: 13:34 Had a fall or experienced change in No activities of daily living that may affect Accompanied By: son risk of falls: Transfer Assistance: Manual Signs or symptoms of abuse/neglect since last No Patient Identification Verified: Yes visito Secondary Verification Process Yes Hospitalized since last visit: No Completed: Pain Present Now: No Patient Requires Transmission-Based No Precautions: Patient Has Alerts: No Electronic Signature(s) Signed: 05/06/2016 5:13:45 PM By: Curtis Sitesorthy, Joanna Entered By: Curtis Sitesorthy, Joanna on 05/06/2016 13:34:22 Strausbaugh, [name] SeatsELIZABETH L. ([id number]) -------------------------------------------------------------------------------- Clinic Level of Care Assessment Details [name], [name] Date of Service: 05/06/2016 1:30 PM Patient Name: [name] Account Number: [id number] Medical Record Treating RN: Curtis SitesDorthy, Joanna [id number] Number: Other Clinician: 30-Dec-1920 (82 y.o. Treating [name], [name] Date of Birth/Sex: Female) Physician/Extender: G Primary Care FULLER, SUSAN Physician: Referring Physician: Meliton RattanFULLER, SUSAN Weeks in Treatment: 6 Clinic Level of Care Assessment Items TOOL 4 Quantity Score []  - Use when only an EandM is performed on FOLLOW-UP visit 0 ASSESSMENTS - Nursing Assessment / Reassessment X - Reassessment of  Co-morbidities (includes updates in patient status) 1 10 X - Reassessment of Adherence to Treatment Plan 1 5 ASSESSMENTS - Wound and Skin Assessment / Reassessment []  - Simple Wound Assessment / Reassessment - one wound 0 X - Complex Wound Assessment / Reassessment - multiple wounds 2 5 []  - Dermatologic / Skin Assessment (not related to wound area) 0 ASSESSMENTS - Focused Assessment []  - Circumferential Edema Measurements - multi extremities 0 []  - Nutritional Assessment / Counseling / Intervention 0 X - Lower Extremity Assessment (monofilament, tuning fork, pulses) 1 5 []  - Peripheral Arterial Disease Assessment (using hand held doppler) 0 ASSESSMENTS - Ostomy and/or Continence Assessment and Care []  - Incontinence Assessment and Management 0 []  - Ostomy Care Assessment and Management (repouching, etc.) 0 PROCESS - Coordination of Care X - Simple Patient / Family Education for ongoing care 1 15 []  - Complex (extensive) Patient / Family Education for ongoing care 0 []  - Staff obtains Consents, Records, Test Results / Process Orders 0 [name], [name] ([id number]) []  - Staff telephones HHA, Nursing Homes / Clarify orders / etc 0 []  - Routine Transfer to another Facility (non-emergent condition) 0 []  - Routine Hospital Admission (non-emergent condition) 0 []  - New Admissions / [employer] Authorizations / Ordering NPWT, Apligraf, etc. 0 []  - Emergency Hospital Admission (emergent condition) 0 X - Simple Discharge Coordination 1 10 []  - Complex (extensive) Discharge Coordination 0 PROCESS - Special Needs []  - Pediatric / Minor Patient Management 0 []  - Isolation Patient Management 0 []  - Hearing / Language / Visual special needs 0 []  - Assessment of Community assistance (transportation, D/C planning, etc.) 0 []  - Additional assistance / Altered mentation 0 []  - Support Surface(s) Assessment (bed, cushion, seat, etc.) 0 INTERVENTIONS - Wound Cleansing / Measurement []  - Simple Wound  Cleansing - one wound 0 X - Complex Wound Cleansing - multiple wounds 2 5 X - Wound Imaging (photographs - any number of wounds) 1 5 []  - Wound Tracing (instead of  photographs) 0 []  - Simple Wound Measurement - one wound 0 X - Complex Wound Measurement - multiple wounds 2 5 INTERVENTIONS - Wound Dressings X - Small Wound Dressing one or multiple wounds 2 10 []  - Medium Wound Dressing one or multiple wounds 0 []  - Large Wound Dressing one or multiple wounds 0 []  - Application of Medications - topical 0 Orrick, [name] ([id number]) []  - Application of Medications - injection 0 INTERVENTIONS - Miscellaneous []  - External ear exam 0 []  - Specimen Collection (cultures, biopsies, blood, body fluids, etc.) 0 []  - Specimen(s) / Culture(s) sent or taken to Lab for analysis 0 []  - Patient Transfer (multiple staff / Michiel Sites Lift / Similar devices) 0 []  - Simple Staple / Suture removal (25 or less) 0 []  - Complex Staple / Suture removal (26 or more) 0 []  - Hypo / Hyperglycemic Management (close monitor of Blood Glucose) 0 []  - Ankle / Brachial Index (ABI) - do not check if billed separately 0 X - Vital Signs 1 5 Has the patient been seen at the hospital within the last three years: Yes Total Score: 105 Level Of Care: New/Established - Level 3 Electronic Signature(s) Signed: 05/06/2016 5:13:45 PM By: Curtis Sites Entered By: Curtis Sites on 05/06/2016 14:15:26 [name], [name] ([id number]) -------------------------------------------------------------------------------- Encounter Discharge Information Details [name] Date of Service: 05/06/2016 1:30 PM Patient Name: [name] Account Number: [id number] Medical Record Treating RN: Curtis Sites [id number] Number: Other Clinician: 1921/05/25 (82 y.o. Treating [name], [name] Date of Birth/Sex: Female) Physician/Extender: G Primary Care FULLER, SUSAN Physician: Referring Physician: Meliton Rattan in Treatment:  6 Encounter Discharge Information Items Discharge Pain Level: 0 Discharge Condition: Stable Ambulatory Status: Wheelchair Discharge Destination: Home Transportation: Private Auto Accompanied By: son Schedule Follow-up Appointment: Yes Medication Reconciliation completed and provided to Patient/Care No [name]: Provided on Clinical Summary of Care: 05/06/2016 Form Type Recipient Paper Patient ES Electronic Signature(s) Signed: 05/06/2016 2:17:09 PM By: Curtis Sites Previous Signature: 05/06/2016 2:07:46 PM Version By: Gwenlyn Perking Entered By: Curtis Sites on 05/06/2016 14:17:09 Caffee, [name] -------------------------------------------------------------------------------- Lower Extremity Assessment Details [name] Date of Service: 05/06/2016 1:30 PM Patient Name: [name] Account Number: [id number] Medical Record Treating RN: Curtis Sites [id number] Number: Other Clinician: May 14, 1921 (82 y.o. Treating [name], [name] Date of Birth/Sex: Female) Physician/Extender: G Primary Care FULLER, SUSAN Physician: Referring Physician: [name] Weeks in Treatment: 6 Edema Assessment Assessed: [Left: No] [Right: No] Edema: [Left: N] [Right: o] Vascular Assessment Pulses: Posterior Tibial Dorsalis Pedis Palpable: [Right:Yes] Extremity colors, hair growth, and conditions: Extremity Color: [Right:Normal] Hair Growth on Extremity: [Right:No] Temperature of Extremity: [Right:Warm] Capillary Refill: [Right:< 3 seconds] Electronic Signature(s) Signed: 05/06/2016 5:13:45 PM By: Curtis Sites Entered By: Curtis Sites on 05/06/2016 13:45:19 Barresi, [name] ([id number]) -------------------------------------------------------------------------------- Multi Wound Chart Details [name] Date of Service: 05/06/2016 1:30 PM Patient Name: [name] Account Number: [id number] Medical Record Treating RN: Curtis Sites [id number] Number: Other Clinician: 07/20/1921 (82 y.o. Treating [name], [name] Date of Birth/Sex: Female) Physician/Extender: G Primary Care FULLER, SUSAN Physician: Referring Physician: [name] Weeks in Treatment: 6 Vital Signs Height(in): 68 Pulse(bpm): 82 Weight(lbs): 118 Blood Pressure 139/63 (mmHg): Body Mass Index(BMI): 18 Temperature(F): Respiratory Rate 16 (breaths/min): Photos: [N/A:N/A] Wound Location: Right Lower Leg - Lateral, Right Calcaneus - Medial N/A Distal Wounding Event: Pressure Injury Pressure Injury N/A Primary Etiology: Pressure Ulcer Pressure Ulcer N/A Comorbid History: Cataracts, Anemia, Cataracts, Anemia, N/A Hypertension, History of Hypertension, History of pressure wounds, pressure wounds, Osteoarthritis, Dementia Osteoarthritis,  Dementia Date Acquired: 01/02/2016 01/02/2016 N/A Weeks of Treatment: 6 6 N/A Wound Status: Open Open N/A Measurements L x W x D 0.1x0.1x0.1 2x1.6x0.1 N/A (cm) Area (cm) : 0.008 2.513 N/A Volume (cm) : 0.001 0.251 N/A % Reduction in Area: 99.80% -60.00% N/A % Reduction in Volume: 100.00% -59.90% N/A Classification: Category/Stage III Category/Stage II N/A Exudate Amount: Medium None Present N/A [name], [name] ([id number]) Exudate Type: Serous N/A N/A Exudate Color: amber N/A N/A Wound Margin: Flat and Intact Indistinct, nonvisible N/A Granulation Amount: None Present (0%) None Present (0%) N/A Necrotic Amount: None Present (0%) Large (67-100%) N/A Necrotic Tissue: N/A Eschar N/A Exposed Structures: Fascia: No Fascia: No N/A Fat: No Fat: No Tendon: No Tendon: No Muscle: No Muscle: No Joint: No Joint: No Bone: No Bone: No Limited to Skin Limited to Skin Breakdown Breakdown Epithelialization: Large (67-100%) Medium (34-66%) N/A Periwound Skin Texture: Scarring: Yes Edema: No N/A Edema: No Excoriation: No Excoriation: No Induration: No Induration: No Callus: No Callus:  No Crepitus: No Crepitus: No Fluctuance: No Fluctuance: No Friable: No Friable: No Rash: No Rash: No Scarring: No Periwound Skin Maceration: No Dry/Scaly: Yes N/A Moisture: Moist: No Maceration: No Dry/Scaly: No Moist: No Periwound Skin Color: Atrophie Blanche: No Atrophie Blanche: No N/A Cyanosis: No Cyanosis: No Ecchymosis: No Ecchymosis: No Erythema: No Erythema: No Hemosiderin Staining: No Hemosiderin Staining: No Mottled: No Mottled: No Pallor: No Pallor: No Rubor: No Rubor: No Tenderness on No No N/A Palpation: Wound Preparation: Ulcer Cleansing: Ulcer Cleansing: N/A Rinsed/Irrigated with Rinsed/Irrigated with Saline Saline Topical Anesthetic Topical Anesthetic Applied: Other: lidocaine Applied: Other: lidocaine 4% 4% Treatment Notes Electronic Signature(s) Signed: 05/06/2016 5:13:45 PM By: [name], [name] ([id number]) Entered By: Curtis Sites on 05/06/2016 13:55:05 Schwoerer, [name] ([id number]) -------------------------------------------------------------------------------- Multi-Disciplinary Care Plan Details [name] Date of Service: 05/06/2016 1:30 PM Patient Name: [name] Account Number: [id number] Medical Record Treating RN: Curtis Sites [id number] Number: Other Clinician: Jan 24, 1921 (82 y.o. Treating [name], [name] Date of Birth/Sex: Female) Physician/Extender: G Primary Care FULLER, SUSAN Physician: Referring Physician: Meliton Rattan in Treatment: 6 Active Inactive Orientation to the Wound Care Program Nursing Diagnoses: Knowledge deficit related to the wound healing center program Goals: Patient/caregiver will verbalize understanding of the Wound Healing Center Program Date Initiated: 03/24/2016 Goal Status: Active Interventions: Provide education on orientation to the wound center Notes: Pressure Nursing Diagnoses: Knowledge deficit related to causes and risk factors for pressure  ulcer development Knowledge deficit related to management of pressures ulcers Potential for impaired tissue integrity related to pressure, friction, moisture, and shear Goals: Patient will remain free from development of additional pressure ulcers Date Initiated: 03/24/2016 Goal Status: Active Patient will remain free of pressure ulcers Date Initiated: 03/24/2016 Goal Status: Active Patient/caregiver will verbalize risk factors for pressure ulcer development Date Initiated: 03/24/2016 Goal Status: Active Patient/caregiver will verbalize understanding of pressure ulcer management [name], [name] ([id number]) Date Initiated: 03/24/2016 Goal Status: Active Interventions: Assess: immobility, friction, shearing, incontinence upon admission and as needed Assess offloading mechanisms upon admission and as needed Assess potential for pressure ulcer upon admission and as needed Provide education on pressure ulcers Treatment Activities: Patient referred for home evaluation of offloading devices/mattresses : 03/24/2016 Patient referred for pressure reduction/relief devices : 03/24/2016 Patient referred for seating evaluation to ensure proper offloading : 03/24/2016 Pressure reduction/relief device ordered : 03/24/2016 Notes: Wound/Skin Impairment Nursing Diagnoses: Impaired tissue integrity Knowledge deficit related to smoking impact on wound healing Knowledge deficit related to ulceration/compromised skin integrity Goals:  Patient/caregiver will verbalize understanding of skin care regimen Date Initiated: 03/24/2016 Goal Status: Active Ulcer/skin breakdown will have a volume reduction of 30% by week 4 Date Initiated: 03/24/2016 Goal Status: Active Ulcer/skin breakdown will have a volume reduction of 50% by week 8 Date Initiated: 03/24/2016 Goal Status: Active Ulcer/skin breakdown will have a volume reduction of 80% by week 12 Date Initiated: 03/24/2016 Goal Status: Active Ulcer/skin breakdown will  heal within 14 weeks Date Initiated: 03/24/2016 Goal Status: Active Interventions: Assess patient/caregiver ability to obtain necessary supplies Assess patient/caregiver ability to perform ulcer/skin care regimen upon admission and as needed Assess ulceration(s) every visit [name], [name] ([id number]) Provide education on ulcer and skin care Treatment Activities: Patient referred to home care : 03/24/2016 Skin care regimen initiated : 03/24/2016 Topical wound management initiated : 03/24/2016 Notes: Electronic Signature(s) Signed: 05/06/2016 5:13:45 PM By: Curtis Sites Entered By: Curtis Sites on 05/06/2016 13:54:57 Taboada, [name] ([id number]) -------------------------------------------------------------------------------- Pain Assessment Details [name] Date of Service: 05/06/2016 1:30 PM Patient Name: [name] Account Number: [id number] Medical Record Treating RN: Curtis Sites [id number] Number: Other Clinician: 03-Nov-1920 (82 y.o. Treating [name], [name] Date of Birth/Sex: Female) Physician/Extender: G Primary Care FULLER, SUSAN Physician: Referring Physician: Meliton Rattan in Treatment: 6 Active Problems Location of Pain Severity and Description of Pain Patient Has Paino No Site Locations Pain Management and Medication Current Pain Management: Notes Topical or injectable lidocaine is offered to patient for acute pain when surgical debridement is performed. If needed, Patient is instructed to use over the counter pain medication for the following 24-48 hours after debridement. Wound care MDs do not prescribed pain medications. Patient has chronic pain or uncontrolled pain. Patient has been instructed to make an appointment with their Primary Care Physician for pain management. Electronic Signature(s) Signed: 05/06/2016 5:13:45 PM By: Curtis Sites Entered By: Curtis Sites on 05/06/2016 13:34:30 Tebbetts, [name]  ([id number]) -------------------------------------------------------------------------------- Patient/Caregiver Education Details [name] Date of Service: 05/06/2016 1:30 PM Patient Name: [name] Account Number: [id number] Medical Record Treating RN: Curtis Sites [id number] Number: Other Clinician: July 12, 1921 (82 y.o. Treating [name], [name] Date of Birth/Gender: Female) Physician/Extender: G Primary Care Weeks in Treatment: 6 FULLER, SUSAN Physician: Referring Physician: [name] Education Assessment Education Provided To: Caregiver Education Topics Provided Wound/Skin Impairment: Handouts: Other: wound care as ordered Methods: Explain/Verbal Responses: State content correctly Electronic Signature(s) Signed: 05/06/2016 5:13:45 PM By: Curtis Sites Entered By: Curtis Sites on 05/06/2016 14:17:25 Dowding, [name] ([id number]) -------------------------------------------------------------------------------- Wound Assessment Details [name] Date of Service: 05/06/2016 1:30 PM Patient Name: [name] Account Number: [id number] Medical Record Treating RN: Curtis Sites [id number] Number: Other Clinician: [birth year] (82 y.o. Treating [name], [name] Date of Birth/Sex: Female) Physician/Extender: G Primary Care FULLER, SUSAN Physician: Referring Physician: [name] Weeks in Treatment: 6 Wound Status Wound Number: 3 Primary Pressure Ulcer Etiology: Wound Location: Right Lower Leg - Lateral, Distal Wound Open Status: Wounding Event: Pressure Injury Comorbid Cataracts, Anemia, Hypertension, Date Acquired: 01/02/2016 History: History of pressure wounds, Weeks Of Treatment: 6 Osteoarthritis, Dementia Clustered Wound: No Photos Wound Measurements Length: (cm) 0.1 Width: (cm) 0.1 Depth: (cm) 0.1 Area: (cm) 0.008 Volume: (cm) 0.001 % Reduction in Area: 99.8% % Reduction in Volume: 100% Epithelialization: Large  (67-100%) Tunneling: No Undermining: No Wound Description Classification: Category/Stage III Foul Odor After Wound Margin: Flat and Intact Exudate Amount: Medium Exudate Type: Serous Exudate Color: amber Cleansing: No Wound Bed Granulation Amount: None Present (0%) Exposed Structure Oppedisano, [name] ([id number]) Necrotic Amount: None Present (0%) Fascia  Exposed: No Fat Layer Exposed: No Tendon Exposed: No Muscle Exposed: No Joint Exposed: No Bone Exposed: No Limited to Skin Breakdown Periwound Skin Texture Texture Color No Abnormalities Noted: No No Abnormalities Noted: No Callus: No Atrophie Blanche: No Crepitus: No Cyanosis: No Excoriation: No Ecchymosis: No Fluctuance: No Erythema: No Friable: No Hemosiderin Staining: No Induration: No Mottled: No Localized Edema: No Pallor: No Rash: No Rubor: No Scarring: Yes Moisture No Abnormalities Noted: No Dry / Scaly: No Maceration: No Moist: No Wound Preparation Ulcer Cleansing: Rinsed/Irrigated with Saline Topical Anesthetic Applied: Other: lidocaine 4%, Treatment Notes Wound #3 (Right, Distal, Lateral Lower Leg) 1. Cleansed with: Clean wound with Normal Saline 2. Anesthetic Topical Lidocaine 4% cream to wound bed prior to debridement 5. Secondary Dressing Applied Bordered Foam Dressing Notes netting, heel cup Electronic Signature(s) Signed: 05/06/2016 5:13:45 PM By: Curtis Sites Entered By: Curtis Sites on 05/06/2016 13:44:04 Marines, [name] ([id number]) -------------------------------------------------------------------------------- Wound Assessment Details [name] Date of Service: 05/06/2016 1:30 PM Patient Name: [name] Account Number: [id number] Medical Record Treating RN: Curtis Sites [id number] Number: Other Clinician: [birth year] (82 y.o. Treating [name], [name] Date of Birth/Sex: Female) Physician/Extender: G Primary Care FULLER, SUSAN Physician: Referring  Physician: [name] Weeks in Treatment: 6 Wound Status Wound Number: 4 Primary Pressure Ulcer Etiology: Wound Location: Right Calcaneus - Medial Wound Open Wounding Event: Pressure Injury Status: Date Acquired: 01/02/2016 Comorbid Cataracts, Anemia, Hypertension, Weeks Of Treatment: 6 History: History of pressure wounds, Clustered Wound: No Osteoarthritis, Dementia Photos Wound Measurements Length: (cm) 2 Width: (cm) 1.6 Depth: (cm) 0.1 Area: (cm) 2.513 Volume: (cm) 0.251 % Reduction in Area: -60% % Reduction in Volume: -59.9% Epithelialization: Medium (34-66%) Tunneling: No Undermining: No Wound Description Classification: Category/Stage II Foul Odor After Wound Margin: Indistinct, nonvisible Exudate Amount: None Present Cleansing: No Wound Bed Granulation Amount: None Present (0%) Exposed Structure Necrotic Amount: Large (67-100%) Fascia Exposed: No Necrotic Quality: Eschar Fat Layer Exposed: No Tyree, [name] ([id number]) Tendon Exposed: No Muscle Exposed: No Joint Exposed: No Bone Exposed: No Limited to Skin Breakdown Periwound Skin Texture Texture Color No Abnormalities Noted: No No Abnormalities Noted: No Callus: No Atrophie Blanche: No Crepitus: No Cyanosis: No Excoriation: No Ecchymosis: No Fluctuance: No Erythema: No Friable: No Hemosiderin Staining: No Induration: No Mottled: No Localized Edema: No Pallor: No Rash: No Rubor: No Scarring: No Moisture No Abnormalities Noted: No Dry / Scaly: Yes Maceration: No Moist: No Wound Preparation Ulcer Cleansing: Rinsed/Irrigated with Saline Topical Anesthetic Applied: Other: lidocaine 4%, Treatment Notes Wound #4 (Right, Medial Calcaneus) 1. Cleansed with: Clean wound with Normal Saline 2. Anesthetic Topical Lidocaine 4% cream to wound bed prior to debridement 4. Dressing Applied: Aquacel Ag 5. Secondary Dressing Applied Foam Kerlix/Conform Notes netting, heel  cup Electronic Signature(s) Signed: 05/06/2016 5:13:45 PM By: Curtis Sites Entered By: Curtis Sites on 05/06/2016 13:44:44 Guise, [name] ([id number]) [name], BIEKER ([id number]) -------------------------------------------------------------------------------- Vitals Details [name] Date of Service: 05/06/2016 1:30 PM Patient Name: [name] Account Number: [id number] Medical Record Treating RN: Curtis Sites [id number] Number: Other Clinician: 03/21/1921 (82 y.o. Treating [name], [name] Date of Birth/Sex: Female) Physician/Extender: G Primary Care FULLER, SUSAN Physician: Referring Physician: [name] Weeks in Treatment: 6 Vital Signs Time Taken: 13:35 Pulse (bpm): 82 Height (in): 68 Respiratory Rate (breaths/min): 16 Weight (lbs): 118 Blood Pressure (mmHg): 139/63 Body Mass Index (BMI): 17.9 Reference Range: 80 - 120 mg / dl Electronic Signature(s) Signed: 05/06/2016 5:13:45 PM By: Curtis Sites Entered By: Curtis Sites on 05/06/2016 13:36:58

## 2016-05-07 NOTE — Progress Notes (Signed)
[name] ([id number]) Visit Report for 05/06/2016 Chief Complaint Document Details [name] Date of Service: 05/06/2016 1:30 PM Patient Name: [name] Account Number: [id number] Medical Record Treating RN: [name] [id number] Number: Other Clinician: 01/22/1921 (82 y.o. Treating [name] Date of Birth/Sex: Female) Physician/Extender: G Primary Care [name] Physician: Referring Physician: Meliton Rattan in Treatment: 6 Information Obtained from: Patient Chief Complaint Follow-up regarding right lateral calf wounds and right heel pressure injury Electronic Signature(s) Signed: 05/06/2016 5:07:12 PM By: [name] Najjar MD Entered By: [name] on 05/06/2016 13:56:30 [name] ([id number]) -------------------------------------------------------------------------------- HPI Details [name] Date of Service: 05/06/2016 1:30 PM Patient Name: [name] Account Number: [id number] Medical Record Treating RN: [name] [id number] Number: Other Clinician: January 27, 1921 (82 y.o. Treating [name] Date of Birth/Sex: Female) Physician/Extender: G Primary Care [name] Physician: Referring Physician: Meliton Rattan in Treatment: 6 History of Present Illness HPI Description: 03/24/16; this is an elderly 82 year old woman with advanced dementia who was hospitalized from 5/8 through 5/13. At that point she had Proteus sepsis felt to be secondary to a UTI the. Acute kidney injury and delirium. Her son who is her primary caregiver says she came home from the hospital with wounds on her right lower extremity and apparently her right buttock as well. There is no mention on the hospital discharge summary of problems. She has been having [facility] and have been using silver alginate based dressings. Apparently the area on the right buttock is healing and the son stated we did not need to  become involved with that. In any case the patient has for wounds to on the right heel and 2 on the posterior lateral right calf, the latter of which is not a usual pressure area however that is the history. She is apparently eating and drinking fairly well. Takes ensure well. She does not have any pressure-relief surface at home. I have reviewed her lab work from the hospital. Her admission albumin was 3.4 on 5/8 04/01/16; patient arrives with wounds looking much the same as last week. She has an extensive pressure area over her right heel and to small but deep wounds on the lateral aspect of her right lower leg. These wounds are not connected. Although there are advertised his pressure ulcers these 2 wounds are not usual for pressure ulcerations. We have not looked at the pressure ulceration on her buttock at the request of the patient's son who is the primary caregiver 04/08/16; wounds are stable to improved this week. Culture I did of the lower wound on her right lateral leg grew methicillin sensitive staph aureus she is on Septra. We are using silver alginate to all the wounds. 04/15/16; the 2 small areas on the lateral aspect of this lady's right leg continued to improve however the heel is once again covered with callus, residual alginate, acrotic material all of which requires a difficult debridement. There continues to be purulent material under this surface. This is in spite of a week of Septra that I gave him for MSSA 04/22/16 Patient today presents for follow-up evaluation. She is seen with her son in the office at this point in time she does have Alzheimer's. The good news is her wounds appeared to be somewhat smaller with current therapy. At this point in time the infection which was cultured previously appears to have improved in regard to the right heel wound. There is no purulent drainage at this point in time and a good portion  of the wound actually is eschar covered and dry with a  medial portion actually open to granulation with very little slough covering. She really has no significant discomfort with palpation manipulation of the wound at this point in time. 04/28/16 patient presents today for follow-up evaluation concerning her right lateral calf wound as well as right heel wound. she has not really been complaining this for as pain is concerned according to her son seen with her in the office at this point in time today. She seems to show no interval signs or symptoms of infection overall has been tolerating the dressing changes well. She does have Alzheimer's therefore is not Karger, [name] ([id number]) able to rate her pain although she does respond with an affirmative that she hurts when I press over the heel region. N/17/17; the area on the lateral aspect of her right calf is healed. The right heel as 2 small spots that are still open and very close to resolving as well using Aquacel Ag under border foam Electronic Signature(s) Signed: 05/06/2016 5:07:12 PM By: [name] Najjar MD Entered By: [name] on 05/06/2016 13:57:17 [name] ([id number]) -------------------------------------------------------------------------------- Physical Exam Details [name] Date of Service: 05/06/2016 1:30 PM Patient Name: [name] Account Number: [id number] Medical Record Treating RN: [name] [id number] Number: Other Clinician: 1921/01/18 (82 y.o. Treating Ronaldo Crilly Date of Birth/Sex: Female) Physician/Extender: G Primary Care [name] Physician: Referring Physician: [name] Weeks in Treatment: 6 Constitutional Sitting or standing Blood Pressure is within target range for patient.. Pulse regular and within target range for patient.Marland Kitchen Respirations regular, non-labored and within target range.. Temperature is normal and within the target range for the patient.. Notes Wound exam; the area on this patient's right  lateral leg is fully healed. The area on the right heel still has 2 small areas which I think should be healed next time. Noteworthy for the fact that she has healing skin over bone there is no subcutaneous tissue here pointed this out to her son who is present Electronic Signature(s) Signed: 05/06/2016 5:07:12 PM By: [name] Najjar MD Entered By: [name] on 05/06/2016 13:59:21 Caamano, Jake [name] ([id number]) -------------------------------------------------------------------------------- Physician Orders Details [name] Date of Service: 05/06/2016 1:30 PM Patient Name: [name] Account Number: [id number] Medical Record Treating RN: [name] [id number] Number: Other Clinician: May 13, 1921 (82 y.o. Treating Talyssa Gibas Date of Birth/Sex: Female) Physician/Extender: G Primary Care [name] Physician: Referring Physician: Meliton Rattan in Treatment: 6 Verbal / Phone Orders: Yes Clinician: Curtis [name] Read Back and Verified: Yes Diagnosis Coding Wound Cleansing Wound #3 Right,Distal,Lateral Lower Leg o Cleanse wound with mild soap and water o May Shower, gently pat wound dry prior to applying new dressing. o May shower with protection. Wound #4 Right,Medial Calcaneus o Cleanse wound with mild soap and water o May Shower, gently pat wound dry prior to applying new dressing. o May shower with protection. Anesthetic Wound #3 Right,Distal,Lateral Lower Leg o Topical Lidocaine 4% cream applied to wound bed prior to debridement - for clinic use Wound #4 Right,Medial Calcaneus o Topical Lidocaine 4% cream applied to wound bed prior to debridement - for clinic use Primary Wound Dressing Wound #4 Right,Medial Calcaneus o Aquacel Ag Secondary Dressing o Boardered Foam Dressing Wound #4 Right,Medial Calcaneus o ABD and Kerlix/Conform - tape and stretch netting #4 o Dry Gauze o Foam - allevyn heel cup Dressing  Change Frequency Wound #3 Right,Distal,Lateral Lower Leg o Other: - twice weekly Tones, Darcelle L. ([id number]) Wound #  4 Right,Medial Calcaneus o Change dressing every other day. Follow-up Appointments Wound #3 Right,Distal,Lateral Lower Leg o Return Appointment in 2 weeks. Wound #4 Right,Medial Calcaneus o Return Appointment in 2 weeks. Off-Loading Wound #3 Right,Distal,Lateral Lower Leg o Heel suspension boot to: - SAGE BOOTS o Turn and reposition every 2 hours Wound #4 Right,Medial Calcaneus o Heel suspension boot to: - SAGE BOOTS o Turn and reposition every 2 hours Additional Orders / Instructions Wound #3 Right,Distal,Lateral Lower Leg o Increase protein intake. - Ensure, Boost o Other: - VItamin C, A, MVI, Zinc Wound #4 Right,Medial Calcaneus o Increase protein intake. - Ensure, Boost o Other: - VItamin C, A, MVI, Zinc Home Health Wound #3 Right,Distal,Lateral Lower Leg o Continue Home Health Visits - [facility] o Home Health Nurse may visit PRN to address patientos wound care needs. o FACE TO FACE ENCOUNTER: MEDICARE and MEDICAID PATIENTS: I certify that this patient is under my care and that I had a face-to-face encounter that meets the physician face-to-face encounter requirements with this patient on this date. The encounter with the patient was in whole or in part for the following MEDICAL CONDITION: (primary reason for Home Healthcare) MEDICAL NECESSITY: I certify, that based on my findings, NURSING services are a medically necessary home health service. HOME BOUND STATUS: I certify that my clinical findings support that this patient is homebound (i.e., Due to illness or injury, pt requires aid of supportive devices such as crutches, cane, wheelchairs, walkers, the use of special transportation or the assistance of another person to leave their place of residence. There is a normal inability to leave the home and doing so  requires considerable and taxing effort. Other absences are for medical reasons / religious services and are infrequent or of short duration when for other reasons). o If current dressing causes regression in wound condition, may D/C ordered dressing product/s and apply Normal Saline Moist Dressing daily until next Wound Healing Center / Other MD appointment. Notify Wound Healing Center of regression in wound condition at [phone]. STEPHAINE, BRESHEARS ([id number]) o Please direct any NON-WOUND related issues/requests for orders to patient's Primary Care Physician Wound #4 Right,Medial Calcaneus o Continue Home Health Visits - [facility] o Home Health Nurse may visit PRN to address patientos wound care needs. o FACE TO FACE ENCOUNTER: MEDICARE and MEDICAID PATIENTS: I certify that this patient is under my care and that I had a face-to-face encounter that meets the physician face-to-face encounter requirements with this patient on this date. The encounter with the patient was in whole or in part for the following MEDICAL CONDITION: (primary reason for Home Healthcare) MEDICAL NECESSITY: I certify, that based on my findings, NURSING services are a medically necessary home health service. HOME BOUND STATUS: I certify that my clinical findings support that this patient is homebound (i.e., Due to illness or injury, pt requires aid of supportive devices such as crutches, cane, wheelchairs, walkers, the use of special transportation or the assistance of another person to leave their place of residence. There is a normal inability to leave the home and doing so requires considerable and taxing effort. Other absences are for medical reasons / religious services and are infrequent or of short duration when for other reasons). o If current dressing causes regression in wound condition, may D/C ordered dressing product/s and apply Normal Saline Moist Dressing daily until next  Wound Healing Center / Other MD appointment. Notify Wound Healing Center of regression in wound condition at [phone].   o Please direct any NON-WOUND related issues/requests for orders to patient's Primary Care Physician Electronic Signature(s) Signed: 05/06/2016 5:07:12 PM By: [name] Najjar MD Signed: 05/06/2016 5:13:45 PM By: [name] Entered By: [name] on 05/06/2016 13:56:41 Thalman, Jake [name] ([id number]) -------------------------------------------------------------------------------- Problem List Details [name] Date of Service: 05/06/2016 1:30 PM Patient Name: [name] Account Number: [id number] Medical Record Treating RN: [name] [id number] Number: Other Clinician: 1920/10/18 (82 y.o. 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 Account Number: [id number] Medical Record Treating RN: [name] [id number] Number: Other Clinician: [birth year] (82 y.o. Treating Leondro Coryell Date of Birth/Sex: Female) Physician/Extender: G Primary  Care [name] Physician: Referring Physician: Meliton Rattan in Treatment: 6 Subjective Chief Complaint Information obtained from Patient Follow-up regarding right lateral calf wounds and right heel pressure injury History of Present Illness (HPI) 03/24/16; this is an elderly 82 year old woman with advanced dementia who was hospitalized from 5/8 through 5/13. At that point she had Proteus sepsis felt to be secondary to a UTI the. Acute kidney injury and delirium. Her son who is her primary caregiver says she came home from the hospital with wounds on her right lower extremity and apparently her right buttock as well. There is no mention on the hospital discharge summary of problems. She has been having [facility] and have been using silver alginate based dressings. Apparently the area on the right buttock is healing and the son stated we did not need to become involved with that. In any case the patient has for wounds to on the right heel and 2 on the posterior lateral right calf, the latter of which is not a usual pressure area however that is the history. She is apparently eating and drinking fairly well. Takes ensure well. She does not have any pressure-relief surface at home. I have reviewed her lab work from the hospital. Her admission albumin was 3.4 on 5/8 04/01/16; patient arrives with wounds looking much the same as last week. She has an extensive pressure area over her right heel and to small but deep wounds on the lateral aspect of her right lower leg. These wounds are not connected. Although there are advertised his pressure ulcers these 2 wounds are not usual for pressure ulcerations. We have not looked at the pressure ulceration on her buttock at the request of the patient's son who is the primary caregiver 04/08/16; wounds are stable to improved this week. Culture I did of the lower wound on her right lateral leg grew methicillin sensitive staph aureus she is  on Septra. We are using silver alginate to all the wounds. 04/15/16; the 2 small areas on the lateral aspect of this lady's right leg continued to improve however the heel is once again covered with callus, residual alginate, acrotic material all of which requires a difficult debridement. There continues to be purulent  material under this surface. This is in spite of a week of Septra that I gave him for MSSA 04/22/16 Patient today presents for follow-up evaluation. She is seen with her son in the office at this point in time she does have Alzheimer's. The good news is her wounds appeared to be somewhat smaller with current therapy. At this point in time the infection which was cultured previously appears to have improved in regard to the right heel wound. There is no purulent drainage at this point in time and a good portion of the wound actually is eschar covered and dry with a medial portion actually open to granulation with very little slough covering. She really has no significant discomfort with palpation manipulation of the wound at [name] BabeSUMMERS, Vinaya L. ([id number]) this point in time. 04/28/16 patient presents today for follow-up evaluation concerning her right lateral calf wound as well as right heel wound. she has not really been complaining this for as pain is concerned according to her son seen with her in the office at this point in time today. She seems to show no interval signs or symptoms of infection overall has been tolerating the dressing changes well. She does have Alzheimer's therefore is not able to rate her pain although she does respond with an affirmative that she hurts when I press over the heel region. N/17/17; the area on the lateral aspect of her right calf is healed. The right heel as 2 small spots that are still open and very close to resolving as well using Aquacel Ag under border foam Objective Constitutional Sitting or standing Blood Pressure is within target  range for patient.. Pulse regular and within target range for patient.Marland Kitchen. Respirations regular, non-labored and within target range.. Temperature is normal and within the target range for the patient.. Vitals Time Taken: 1:35 PM, Height: 68 in, Weight: 118 lbs, BMI: 17.9, Pulse: 82 bpm, Respiratory Rate: 16 breaths/min, Blood Pressure: 139/63 mmHg. General Notes: Wound exam; the area on this patient's right lateral leg is fully healed. The area on the right heel still has 2 small areas which I think should be healed next time. Noteworthy for the fact that she has healing skin over bone there is no subcutaneous tissue here pointed this out to her son who is present Integumentary (Hair, Skin) Wound #3 status is Open. Original cause of wound was Pressure Injury. The wound is located on the Right,Distal,Lateral Lower Leg. The wound measures 0.1cm length x 0.1cm width x 0.1cm depth; 0.008cm^2 area and 0.001cm^3 volume. The wound is limited to skin breakdown. There is no tunneling or undermining noted. There is a medium amount of serous drainage noted. The wound margin is flat and intact. There is no granulation within the wound bed. There is no necrotic tissue within the wound bed. The periwound skin appearance exhibited: Scarring. The periwound skin appearance did not exhibit: Callus, Crepitus, Excoriation, Fluctuance, Friable, Induration, Localized Edema, Rash, Dry/Scaly, Maceration, Moist, Atrophie Blanche, Cyanosis, Ecchymosis, Hemosiderin Staining, Mottled, Pallor, Rubor, Erythema. Wound #4 status is Open. Original cause of wound was Pressure Injury. The wound is located on the Right,Medial Calcaneus. The wound measures 2cm length x 1.6cm width x 0.1cm depth; 2.513cm^2 area and 0.251cm^3 volume. The wound is limited to skin breakdown. There is no tunneling or undermining noted. There is a none present amount of drainage noted. The wound margin is indistinct and nonvisible. There is no granulation  within the wound bed. There is a large (67-100%) amount of necrotic tissue within  the wound bed including Eschar. The periwound skin appearance exhibited: Dry/Scaly. The periwound skin appearance did not exhibit: Callus, Crepitus, Excoriation, Fluctuance, Friable, Induration, Localized Edema, Rash, Scarring, Maceration, Moist, Atrophie Blanche, Cyanosis, Ecchymosis, Hemosiderin Staining, Peter, Manila L. ([id number]) Mottled, Pallor, Rubor, Erythema. Assessment Active Problems ICD-10 L89.610 - Pressure ulcer of right heel, unstageable L97.212 - Non-pressure chronic ulcer of right calf with fat layer exposed G30.9 - Alzheimer's disease, unspecified Plan Wound Cleansing: Wound #3 Right,Distal,Lateral Lower Leg: Cleanse wound with mild soap and water May Shower, gently pat wound dry prior to applying new dressing. May shower with protection. Wound #4 Right,Medial Calcaneus: Cleanse wound with mild soap and water May Shower, gently pat wound dry prior to applying new dressing. May shower with protection. Anesthetic: Wound #3 Right,Distal,Lateral Lower Leg: Topical Lidocaine 4% cream applied to wound bed prior to debridement - for clinic use Wound #4 Right,Medial Calcaneus: Topical Lidocaine 4% cream applied to wound bed prior to debridement - for clinic use Primary Wound Dressing: Wound #4 Right,Medial Calcaneus: Aquacel Ag Secondary Dressing: Boardered Foam Dressing Wound #4 Right,Medial Calcaneus: ABD and Kerlix/Conform - tape and stretch netting #4 Dry Gauze Foam - allevyn heel cup Dressing Change Frequency: Wound #3 Right,Distal,Lateral Lower Leg: Other: - twice weekly Wound #4 Right,Medial Calcaneus: Change dressing every other day. Follow-up Appointments: TAHLIYAH, ANAGNOS ([id number]) Wound #3 Right,Distal,Lateral Lower Leg: Return Appointment in 2 weeks. Wound #4 Right,Medial Calcaneus: Return Appointment in 2 weeks. Off-Loading: Wound #3  Right,Distal,Lateral Lower Leg: Heel suspension boot to: - SAGE BOOTS Turn and reposition every 2 hours Wound #4 Right,Medial Calcaneus: Heel suspension boot to: - SAGE BOOTS Turn and reposition every 2 hours Additional Orders / Instructions: Wound #3 Right,Distal,Lateral Lower Leg: Increase protein intake. - Ensure, Boost Other: - VItamin C, A, MVI, Zinc Wound #4 Right,Medial Calcaneus: Increase protein intake. - Ensure, Boost Other: - VItamin C, A, MVI, Zinc Home Health: Wound #3 Right,Distal,Lateral Lower Leg: Continue Home Health Visits - [facility] Home Health Nurse may visit PRN to address patient s wound care needs. FACE TO FACE ENCOUNTER: MEDICARE and MEDICAID PATIENTS: I certify that this patient is under my care and that I had a face-to-face encounter that meets the physician face-to-face encounter requirements with this patient on this date. The encounter with the patient was in whole or in part for the following MEDICAL CONDITION: (primary reason for Home Healthcare) MEDICAL NECESSITY: I certify, that based on my findings, NURSING services are a medically necessary home health service. HOME BOUND STATUS: I certify that my clinical findings support that this patient is homebound (i.e., Due to illness or injury, pt requires aid of supportive devices such as crutches, cane, wheelchairs, walkers, the use of special transportation or the assistance of another person to leave their place of residence. There is a normal inability to leave the home and doing so requires considerable and taxing effort. Other absences are for medical reasons / religious services and are infrequent or of short duration when for other reasons). If current dressing causes regression in wound condition, may D/C ordered dressing product/s and apply Normal Saline Moist Dressing daily until next Wound Healing Center / Other MD appointment. Notify Wound Healing Center of regression in wound condition  at [phone]. Please direct any NON-WOUND related issues/requests for orders to patient's Primary Care Physician Wound #4 Right,Medial Calcaneus: Continue Home Health Visits - [facility] Home Health Nurse may visit PRN to address patient s wound care needs. FACE TO FACE ENCOUNTER: MEDICARE  and MEDICAID PATIENTS: I certify that this patient is under my care and that I had a face-to-face encounter that meets the physician face-to-face encounter requirements with this patient on this date. The encounter with the patient was in whole or in part for the following MEDICAL CONDITION: (primary reason for Home Healthcare) MEDICAL NECESSITY: I certify, that based on my findings, NURSING services are a medically necessary home health service. HOME BOUND STATUS: I certify that my clinical findings support that this patient is homebound (i.e., Due to illness or injury, pt requires aid of supportive devices such as crutches, cane, wheelchairs, walkers, the use of special transportation or the assistance of another person to leave their place of residence. There is a normal inability to leave the home and doing so requires considerable and taxing effort. Other absences are for medical reasons / religious services and are infrequent or of short duration when for other reasons). If current dressing causes regression in wound condition, may D/C ordered dressing product/s and apply Normal Saline Moist Dressing daily until next Wound Healing Center / Other MD appointment. Notify Wound Pancake, Jenaye L. ([id number]) Healing Center of regression in wound condition at [phone]. Please direct any NON-WOUND related issues/requests for orders to patient's Primary Care Physician I am going to continue with Aquacel AG and heel cup to the right heel Foam only to protect a vulneralbe area on the right lateral leg Electronic Signature(s) Signed: 05/06/2016 5:07:12 PM By: [name] Najjar MD Entered By:  [name] on 05/06/2016 14:00:43 Farfan, Jake [name] ([id number]) -------------------------------------------------------------------------------- SuperBill Details [name] Date of Service: 05/06/2016 Patient Name: [name] Account Number: [id number] Medical Record Treating RN: [name] [id number] Number: Other Clinician: 06/23/1921 (82 y.o. Treating Dallon Dacosta Date of Birth/Sex: Female) Physician/Extender: G Primary Care Weeks in Treatment: 6 [name] Physician: Referring Physician: [name] Diagnosis Coding ICD-10 Codes Code Description L89.610 Pressure ulcer of right heel, unstageable L97.212 Non-pressure chronic ulcer of right calf with fat layer exposed G30.9 Alzheimer's disease, unspecified Facility Procedures CPT4 Code: 29528413 Description: 99213 - WOUND CARE VISIT-LEV 3 EST PT Modifier: Quantity: 1 Physician Procedures CPT4 Code: 2440102 Description: 72536 - WC PHYS LEVEL 2 - EST PT ICD-10 Description Diagnosis L89.610 Pressure ulcer of right heel, unstageable Modifier: Quantity: 1 Electronic Signature(s) Signed: 05/06/2016 2:15:59 PM By: [name] Signed: 05/06/2016 5:07:12 PM By: [name] Najjar MD Entered By: [name] on 05/06/2016 14:15:59

## 2016-05-20 ENCOUNTER — Encounter: Payer: Medicare Other | Attending: Internal Medicine | Admitting: Internal Medicine

## 2016-05-20 DIAGNOSIS — L97212 Non-pressure chronic ulcer of right calf with fat layer exposed: Secondary | ICD-10-CM | POA: Insufficient documentation

## 2016-05-20 DIAGNOSIS — L89610 Pressure ulcer of right heel, unstageable: Secondary | ICD-10-CM | POA: Insufficient documentation

## 2016-05-20 DIAGNOSIS — M1990 Unspecified osteoarthritis, unspecified site: Secondary | ICD-10-CM | POA: Diagnosis not present

## 2016-05-20 DIAGNOSIS — G309 Alzheimer's disease, unspecified: Secondary | ICD-10-CM | POA: Diagnosis not present

## 2016-05-20 DIAGNOSIS — I10 Essential (primary) hypertension: Secondary | ICD-10-CM | POA: Diagnosis not present

## 2016-05-21 NOTE — Progress Notes (Signed)
[name], [name] ([id number]) Visit Report for 05/20/2016 Chief Complaint Document Details [name], [name] Date of Service: 05/20/2016 1:30 PM Patient Name: [name] Account Number: [id number] Medical Record Treating RN: Curtis Sites [id number] Number: Other Clinician: 04/27/1921 (82 y.o. Treating Lama Narayanan Date of Birth/Sex: Female) Physician/Extender: G Primary Care FULLER, SUSAN Physician: Referring Physician: Meliton Rattan in Treatment: 8 Information Obtained from: Patient Chief Complaint Follow-up regarding right lateral calf wounds and right heel pressure injury Electronic Signature(s) Signed: 05/20/2016 5:04:57 PM By: Baltazar Najjar MD Entered By: Baltazar Najjar on 05/20/2016 13:53:52 [name], [name] ([id number]) -------------------------------------------------------------------------------- HPI Details [name] Date of Service: 05/20/2016 1:30 PM Patient Name: [name] Account Number: [id number] Medical Record Treating RN: Curtis Sites [id number] Number: Other Clinician: [birth year] (82 y.o. Treating Jarvis Sawa Date of Birth/Sex: Female) Physician/Extender: G Primary Care FULLER, SUSAN Physician: Referring Physician: Meliton Rattan in Treatment: 8 History of Present Illness HPI Description: 03/24/16; this is an elderly 82 year old woman with advanced dementia who was hospitalized from 5/8 through 5/13. At that point she had Proteus sepsis felt to be secondary to a UTI the. Acute kidney injury and delirium. Her son who is her primary caregiver says she came home from the hospital with wounds on her right lower extremity and apparently her right buttock as well. There is no mention on the hospital discharge summary of problems. She has been having [facility] and have been using silver alginate based dressings. Apparently the area on the right buttock is healing and the son stated we did not need to  become involved with that. In any case the patient has for wounds to on the right heel and 2 on the posterior lateral right calf, the latter of which is not a usual pressure area however that is the history. She is apparently eating and drinking fairly well. Takes ensure well. She does not have any pressure-relief surface at home. I have reviewed her lab work from the hospital. Her admission albumin was 3.4 on 5/8 04/01/16; patient arrives with wounds looking much the same as last week. She has an extensive pressure area over her right heel and to small but deep wounds on the lateral aspect of her right lower leg. These wounds are not connected. Although there are advertised his pressure ulcers these 2 wounds are not usual for pressure ulcerations. We have not looked at the pressure ulceration on her buttock at the request of the patient's son who is the primary caregiver 04/08/16; wounds are stable to improved this week. Culture I did of the lower wound on her right lateral leg grew methicillin sensitive staph aureus she is on Septra. We are using silver alginate to all the wounds. 04/15/16; the 2 small areas on the lateral aspect of this lady's right leg continued to improve however the heel is once again covered with callus, residual alginate, acrotic material all of which requires a difficult debridement. There continues to be purulent material under this surface. This is in spite of a week of Septra that I gave him for MSSA 04/22/16 Patient today presents for follow-up evaluation. She is seen with her son in the office at this point in time she does have Alzheimer's. The good news is her wounds appeared to be somewhat smaller with current therapy. At this point in time the infection which was cultured previously appears to have improved in regard to the right heel wound. There is no purulent drainage at this point in time and a good portion  of the wound actually is eschar covered and dry with a  medial portion actually open to granulation with very little slough covering. She really has no significant discomfort with palpation manipulation of the wound at this point in time. 04/28/16 patient presents today for follow-up evaluation concerning her right lateral calf wound as well as right heel wound. she has not really been complaining this for as pain is concerned according to her son seen with her in the office at this point in time today. She seems to show no interval signs or symptoms of infection overall has been tolerating the dressing changes well. She does have Alzheimer's therefore is not Ravi, Anaiz L. ([id number]) able to rate her pain although she does respond with an affirmative that she hurts when I press over the heel region. 05/05/16; the area on the lateral aspect of her right calf is healed. The right heel as 2 small spots that are still open and very close to resolving as well using Aquacel Ag under border foam, 05/20/16; the area on the lateral aspect of her right calf remains closed. The 2 small areas on her right heel are also closed. Electronic Signature(s) Signed: 05/20/2016 5:04:57 PM By: Baltazar Najjarobson, Abou Sterkel MD Entered By: Baltazar Najjarobson, Arzella Rehmann on 05/20/2016 13:54:37 [name], [name] SeatsELIZABETH L. ([id number]) -------------------------------------------------------------------------------- Physical Exam Details [name], [name] Date of Service: 05/20/2016 1:30 PM Patient Name: [name] Account Number: [id number] Medical Record Treating RN: Curtis SitesDorthy, Joanna [name] Number: Other Clinician: Apr 23, 1921 (82 y.o. Treating Aniah Pauli Date of Birth/Sex: Female) Physician/Extender: G Primary Care FULLER, SUSAN Physician: Referring Physician: Tomi BambergerFULLER, SUSAN Weeks in Treatment: 8 Constitutional Patient is hypertensive.. Pulse regular and within target range for patient.Marland Kitchen. Respirations regular, non-labored and within target range.. Temperature is normal and within the  target range for the patient.. Patient is in no distress. Eyes Conjunctivae clear. No discharge.Marland Kitchen. Respiratory Respiratory effort is easy and symmetric bilaterally. Rate is normal at rest and on room air.. Cardiovascular Pedal pulses palpable and strong bilaterally.. Lymphatic Nonpalpable in the popliteal or inguinal area. Integumentary (Hair, Skin) Skin on her bilateral lower extremities is dry and flaking. Psychiatric Severe dementia but no overt changes. Notes Wound exam; the area on her right lateral leg has remained closed over. Last week she had 2 small areas on the Achilles part of her right heel both of these are also closed over this week. Electronic Signature(s) Signed: 05/20/2016 5:04:57 PM By: Baltazar Najjarobson, Miamor Ayler MD Entered By: Baltazar Najjarobson, Rylan Bernard on 05/20/2016 13:56:07 Rowley, [name] SeatsELIZABETH L. ([id number]) -------------------------------------------------------------------------------- Physician Orders Details [name], [name] Date of Service: 05/20/2016 1:30 PM Patient Name: [name] Account Number: [id number] Medical Record Treating RN: Curtis SitesDorthy, Joanna [id number] Number: Other Clinician: Apr 23, 1921 (82 y.o. Treating Sheria Rosello Date of Birth/Sex: Female) Physician/Extender: G Primary Care FULLER, SUSAN Physician: Referring Physician: Meliton RattanFULLER, SUSAN Weeks in Treatment: 8 Verbal / Phone Orders: Yes Clinician: Curtis Sitesorthy, Joanna Read Back and Verified: Yes Diagnosis Coding [facility] Electronic Signature(s) Signed: 05/20/2016 5:04:57 PM By: Baltazar Najjarobson, Alexandros Ewan MD Signed: 05/20/2016 5:26:36 PM By: Curtis Sitesorthy, Joanna Entered By: Curtis Sitesorthy, Joanna on 05/20/2016 13:52:35 Treat, [name] SeatsELIZABETH L. ([id number]) -------------------------------------------------------------------------------- Problem List Details [name], Kaylana Date of Service: 05/20/2016 1:30 PM Patient Name: [name] Account Number: [id number] Medical Record Treating  RN: Curtis SitesDorthy, Joanna [id number] Number: Other Clinician: Apr 23, 1921 (82 y.o. Treating Yun Gutierrez Date of Birth/Sex: Female) Physician/Extender: G Primary Care FULLER, SUSAN Physician: Referring Physician: Meliton RattanFULLER, SUSAN Weeks in Treatment: 8 Active Problems ICD-10 Encounter Code Description Active Date Diagnosis L89.610 Pressure  ulcer of right heel, unstageable 03/24/2016 Yes L97.212 Non-pressure chronic ulcer of right calf with fat layer 03/24/2016 Yes exposed G30.9 Alzheimer's disease, unspecified 03/24/2016 Yes Inactive Problems ICD-10 Code Description Active Date Inactive Date L89.613 Pressure ulcer of right heel, stage 3 03/24/2016 03/24/2016 Resolved Problems Electronic Signature(s) Signed: 05/20/2016 5:04:57 PM By: Baltazar Najjar MD Entered By: Baltazar Najjar on 05/20/2016 13:53:34 [name], [name] ([id number]) -------------------------------------------------------------------------------- Progress Note Details [name] Date of Service: 05/20/2016 1:30 PM Patient Name: [name] Account Number: [id number] Medical Record Treating RN: Curtis Sites [id number] Number: Other Clinician: 10-30-1920 (82 y.o. Treating Luiz Trumpower Date of Birth/Sex: Female) Physician/Extender: G Primary Care FULLER, SUSAN Physician: Referring Physician: Meliton Rattan in Treatment: 8 Subjective Chief Complaint Information obtained from Patient Follow-up regarding right lateral calf wounds and right heel pressure injury History of Present Illness (HPI) 03/24/16; this is an elderly 82 year old woman with advanced dementia who was hospitalized from 5/8 through 5/13. At that point she had Proteus sepsis felt to be secondary to a UTI the. Acute kidney injury and delirium. Her son who is her primary caregiver says she came home from the hospital with wounds on her right lower extremity and apparently her right buttock as well. There is no mention on the hospital discharge  summary of problems. She has been having [facility] and have been using silver alginate based dressings. Apparently the area on the right buttock is healing and the son stated we did not need to become involved with that. In any case the patient has for wounds to on the right heel and 2 on the posterior lateral right calf, the latter of which is not a usual pressure area however that is the history. She is apparently eating and drinking fairly well. Takes ensure well. She does not have any pressure-relief surface at home. I have reviewed her lab work from the hospital. Her admission albumin was 3.4 on 5/8 04/01/16; patient arrives with wounds looking much the same as last week. She has an extensive pressure area over her right heel and to small but deep wounds on the lateral aspect of her right lower leg. These wounds are not connected. Although there are advertised his pressure ulcers these 2 wounds are not usual for pressure ulcerations. We have not looked at the pressure ulceration on her buttock at the request of the patient's son who is the primary caregiver 04/08/16; wounds are stable to improved this week. Culture I did of the lower wound on her right lateral leg grew methicillin sensitive staph aureus she is on Septra. We are using silver alginate to all the wounds. 04/15/16; the 2 small areas on the lateral aspect of this lady's right leg continued to improve however the heel is once again covered with callus, residual alginate, acrotic material all of which requires a difficult debridement. There continues to be purulent material under this surface. This is in spite of a week of Septra that I gave him for MSSA 04/22/16 Patient today presents for follow-up evaluation. She is seen with her son in the office at this point in time she does have Alzheimer's. The good news is her wounds appeared to be somewhat smaller with current therapy. At this point in time the infection which was  cultured previously appears to have improved in regard to the right heel wound. There is no purulent drainage at this point in time and a good portion of the wound actually is eschar covered and dry with a medial portion  actually open to granulation with very little slough covering. She really has no significant discomfort with palpation manipulation of the wound at [name], [name] ([id number]) this point in time. 04/28/16 patient presents today for follow-up evaluation concerning her right lateral calf wound as well as right heel wound. she has not really been complaining this for as pain is concerned according to her son seen with her in the office at this point in time today. She seems to show no interval signs or symptoms of infection overall has been tolerating the dressing changes well. She does have Alzheimer's therefore is not able to rate her pain although she does respond with an affirmative that she hurts when I press over the heel region. 05/05/16; the area on the lateral aspect of her right calf is healed. The right heel as 2 small spots that are still open and very close to resolving as well using Aquacel Ag under border foam, 05/20/16; the area on the lateral aspect of her right calf remains closed. The 2 small areas on her right heel are also closed. Objective Constitutional Patient is hypertensive.. Pulse regular and within target range for patient.Marland Kitchen Respirations regular, non-labored and within target range.. Temperature is normal and within the target range for the patient.. Patient is in no distress. Vitals Time Taken: 1:35 PM, Height: 68 in, Weight: 118 lbs, BMI: 17.9, Temperature: 98.1 F, Pulse: 85 bpm, Respiratory Rate: 16 breaths/min, Blood Pressure: 159/84 mmHg. Eyes Conjunctivae clear. No discharge.Marland Kitchen Respiratory Respiratory effort is easy and symmetric bilaterally. Rate is normal at rest and on room air.. Cardiovascular Pedal pulses palpable and strong  bilaterally.. Lymphatic Nonpalpable in the popliteal or inguinal area. Psychiatric Severe dementia but no overt changes. General Notes: Wound exam; the area on her right lateral leg has remained closed over. Last week she had 2 small areas on the Achilles part of her right heel both of these are also closed over this week. Integumentary (Hair, Skin) Skin on her bilateral lower extremities is dry and flaking. [name], [name] ([id number]) Wound #3 status is Healed - Epithelialized. Original cause of wound was Pressure Injury. The wound is located on the Right,Distal,Lateral Lower Leg. The wound measures 0cm length x 0cm width x 0cm depth; 0cm^2 area and 0cm^3 volume. The wound is limited to skin breakdown. There is no tunneling or undermining noted. There is a none present amount of drainage noted. The wound margin is flat and intact. There is no granulation within the wound bed. There is no necrotic tissue within the wound bed. The periwound skin appearance did not exhibit: Callus, Crepitus, Excoriation, Fluctuance, Friable, Induration, Localized Edema, Rash, Scarring, Dry/Scaly, Maceration, Moist, Atrophie Blanche, Cyanosis, Ecchymosis, Hemosiderin Staining, Mottled, Pallor, Rubor, Erythema. Wound #4 status is Healed - Epithelialized. Original cause of wound was Pressure Injury. The wound is located on the Right,Medial Calcaneus. The wound measures 0cm length x 0cm width x 0cm depth; 0cm^2 area and 0cm^3 volume. The wound is limited to skin breakdown. There is no tunneling or undermining noted. There is a none present amount of drainage noted. The wound margin is indistinct and nonvisible. There is no granulation within the wound bed. There is no necrotic tissue within the wound bed. The periwound skin appearance exhibited: Dry/Scaly. The periwound skin appearance did not exhibit: Callus, Crepitus, Excoriation, Fluctuance, Friable, Induration, Localized Edema, Rash, Scarring, Maceration,  Moist, Atrophie Blanche, Cyanosis, Ecchymosis, Hemosiderin Staining, Mottled, Pallor, Rubor, Erythema. Assessment Active Problems ICD-10 L89.610 - Pressure ulcer of right heel, unstageable N82.956 -  Non-pressure chronic ulcer of right calf with fat layer exposed G30.9 - Alzheimer's disease, unspecified [facility] #1 both the patient's areas are closed [name], [name] ([id number]) #2 in terms of secondary prevention for this elderly woman with fairly advanced dementia I have suggested continued use of her bunny boots #3 lubricating skin cream to her feet and lower legs at night would also be helpful #4 she can be discharged from the clinic Electronic Signature(s) Signed: 05/20/2016 5:04:57 PM By: Baltazar Najjarobson, Caidence Kaseman MD Entered By: Baltazar Najjarobson, Blease Capaldi on 05/20/2016 13:57:05 [name], [name] SeatsELIZABETH L. ([id number]) -------------------------------------------------------------------------------- SuperBill Details [name], Masae Date of Service: 05/20/2016 Patient Name: [name] Account Number: [id number] Medical Record Treating RN: Curtis SitesDorthy, Joanna [id number] Number: Other Clinician: 05-Oct-1920 (82 y.o. Treating Erdine Hulen Date of Birth/Sex: Female) Physician/Extender: G Primary Care Weeks in Treatment: 8 FULLER, SUSAN Physician: Referring Physician: Tomi BambergerFULLER, SUSAN Diagnosis Coding ICD-10 Codes Code Description L89.610 Pressure ulcer of right heel, unstageable L97.212 Non-pressure chronic ulcer of right calf with fat layer exposed G30.9 Alzheimer's disease, unspecified Facility Procedures CPT4 Code: [id number] Description: 99213 - WOUND CARE VISIT-LEV 3 EST PT Modifier: Quantity: 1 Physician Procedures CPT4 Code: [id number] Description: 99213 - WC PHYS LEVEL 3 - EST PT ICD-10 Description Diagnosis L89.610 Pressure ulcer of right heel, unstageable L97.212 Non-pressure chronic ulcer of right calf with fat Modifier: layer  exposed Quantity: 1 Electronic Signature(s) Signed: 05/20/2016 5:04:57 PM By: Baltazar Najjarobson, Kenzley Ke MD Entered By: Baltazar Najjarobson, Marieann Zipp on 05/20/2016 13:57:29
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
## 2016-08-25 ENCOUNTER — Encounter (HOSPITAL_COMMUNITY): Payer: Self-pay | Admitting: Emergency Medicine

## 2016-08-25 ENCOUNTER — Inpatient Hospital Stay (HOSPITAL_COMMUNITY)
Admission: EM | Admit: 2016-08-25 | Discharge: 2016-09-02 | DRG: 871 | Disposition: A | Discharge status: Home / Self Care | Payer: Medicare Other | Attending: Internal Medicine | Admitting: Internal Medicine

## 2016-08-25 ENCOUNTER — Emergency Department (HOSPITAL_COMMUNITY): Payer: Medicare Other

## 2016-08-25 DIAGNOSIS — R0600 Dyspnea, unspecified: Secondary | ICD-10-CM | POA: Diagnosis not present

## 2016-08-25 DIAGNOSIS — J189 Pneumonia, unspecified organism: Secondary | ICD-10-CM

## 2016-08-25 DIAGNOSIS — J101 Influenza due to other identified influenza virus with other respiratory manifestations: Secondary | ICD-10-CM | POA: Diagnosis not present

## 2016-08-25 DIAGNOSIS — Z6822 Body mass index (BMI) 22.0-22.9, adult: Secondary | ICD-10-CM

## 2016-08-25 DIAGNOSIS — I70209 Unspecified atherosclerosis of native arteries of extremities, unspecified extremity: Secondary | ICD-10-CM

## 2016-08-25 DIAGNOSIS — E876 Hypokalemia: Secondary | ICD-10-CM | POA: Diagnosis present

## 2016-08-25 DIAGNOSIS — E43 Unspecified severe protein-calorie malnutrition: Secondary | ICD-10-CM | POA: Diagnosis present

## 2016-08-25 DIAGNOSIS — R000 Tachycardia, unspecified: Secondary | ICD-10-CM | POA: Diagnosis present

## 2016-08-25 DIAGNOSIS — Z87440 Personal history of urinary (tract) infections: Secondary | ICD-10-CM

## 2016-08-25 DIAGNOSIS — G9340 Encephalopathy, unspecified: Secondary | ICD-10-CM | POA: Diagnosis not present

## 2016-08-25 DIAGNOSIS — J440 Chronic obstructive pulmonary disease with acute lower respiratory infection: Secondary | ICD-10-CM | POA: Diagnosis present

## 2016-08-25 DIAGNOSIS — Z8249 Family history of ischemic heart disease and other diseases of the circulatory system: Secondary | ICD-10-CM

## 2016-08-25 DIAGNOSIS — J111 Influenza due to unidentified influenza virus with other respiratory manifestations: Secondary | ICD-10-CM

## 2016-08-25 DIAGNOSIS — Z90710 Acquired absence of both cervix and uterus: Secondary | ICD-10-CM

## 2016-08-25 DIAGNOSIS — A419 Sepsis, unspecified organism: Secondary | ICD-10-CM | POA: Diagnosis not present

## 2016-08-25 DIAGNOSIS — G308 Other Alzheimer's disease: Secondary | ICD-10-CM

## 2016-08-25 DIAGNOSIS — R0603 Acute respiratory distress: Secondary | ICD-10-CM | POA: Insufficient documentation

## 2016-08-25 DIAGNOSIS — F0390 Unspecified dementia without behavioral disturbance: Secondary | ICD-10-CM | POA: Diagnosis present

## 2016-08-25 DIAGNOSIS — G309 Alzheimer's disease, unspecified: Secondary | ICD-10-CM | POA: Diagnosis present

## 2016-08-25 DIAGNOSIS — G9341 Metabolic encephalopathy: Secondary | ICD-10-CM | POA: Diagnosis present

## 2016-08-25 DIAGNOSIS — Z66 Do not resuscitate: Secondary | ICD-10-CM | POA: Diagnosis present

## 2016-08-25 DIAGNOSIS — Z79899 Other long term (current) drug therapy: Secondary | ICD-10-CM

## 2016-08-25 DIAGNOSIS — F0280 Dementia in other diseases classified elsewhere without behavioral disturbance: Secondary | ICD-10-CM | POA: Diagnosis present

## 2016-08-25 DIAGNOSIS — E871 Hypo-osmolality and hyponatremia: Secondary | ICD-10-CM | POA: Diagnosis present

## 2016-08-25 DIAGNOSIS — J1000 Influenza due to other identified influenza virus with unspecified type of pneumonia: Secondary | ICD-10-CM | POA: Diagnosis present

## 2016-08-25 DIAGNOSIS — M1990 Unspecified osteoarthritis, unspecified site: Secondary | ICD-10-CM | POA: Diagnosis present

## 2016-08-25 DIAGNOSIS — D649 Anemia, unspecified: Secondary | ICD-10-CM | POA: Diagnosis present

## 2016-08-25 DIAGNOSIS — R739 Hyperglycemia, unspecified: Secondary | ICD-10-CM | POA: Diagnosis present

## 2016-08-25 DIAGNOSIS — L8990 Pressure ulcer of unspecified site, unspecified stage: Secondary | ICD-10-CM | POA: Diagnosis present

## 2016-08-25 LAB — URINALYSIS, ROUTINE W REFLEX MICROSCOPIC
Bilirubin Urine: NEGATIVE
Glucose, UA: NEGATIVE mg/dL
KETONES UR: NEGATIVE mg/dL
LEUKOCYTES UA: NEGATIVE
Nitrite: NEGATIVE
PROTEIN: 30 mg/dL — AB
Specific Gravity, Urine: 1.011 (ref 1.005–1.030)
pH: 7 (ref 5.0–8.0)

## 2016-08-25 LAB — CBC WITH DIFFERENTIAL/PLATELET
BASOS PCT: 0 %
Basophils Absolute: 0 10*3/uL (ref 0.0–0.1)
EOS ABS: 0 10*3/uL (ref 0.0–0.7)
EOS PCT: 0 %
HCT: 36.9 % (ref 36.0–46.0)
HEMOGLOBIN: 12.2 g/dL (ref 12.0–15.0)
Lymphocytes Relative: 9 %
Lymphs Abs: 0.8 10*3/uL (ref 0.7–4.0)
MCH: 29 pg (ref 26.0–34.0)
MCHC: 33.1 g/dL (ref 30.0–36.0)
MCV: 87.9 fL (ref 78.0–100.0)
Monocytes Absolute: 0.8 10*3/uL (ref 0.1–1.0)
Monocytes Relative: 9 %
NEUTROS PCT: 82 %
Neutro Abs: 7 10*3/uL (ref 1.7–7.7)
PLATELETS: 272 10*3/uL (ref 150–400)
RBC: 4.2 MIL/uL (ref 3.87–5.11)
RDW: 14.6 % (ref 11.5–15.5)
WBC: 8.6 10*3/uL (ref 4.0–10.5)

## 2016-08-25 LAB — COMPREHENSIVE METABOLIC PANEL
ALBUMIN: 3.3 g/dL — AB (ref 3.5–5.0)
ALK PHOS: 75 U/L (ref 38–126)
ALT: 17 U/L (ref 14–54)
ANION GAP: 13 (ref 5–15)
AST: 25 U/L (ref 15–41)
BUN: 13 mg/dL (ref 6–20)
CALCIUM: 9 mg/dL (ref 8.9–10.3)
CO2: 29 mmol/L (ref 22–32)
CREATININE: 0.7 mg/dL (ref 0.44–1.00)
Chloride: 97 mmol/L — ABNORMAL LOW (ref 101–111)
GFR calc non Af Amer: >60 mL/min (ref >60)
GLUCOSE: 133 mg/dL — AB (ref 65–99)
Potassium: 3.4 mmol/L — ABNORMAL LOW (ref 3.5–5.1)
Sodium: 139 mmol/L (ref 135–145)
TOTAL PROTEIN: 7.4 g/dL (ref 6.5–8.1)
Total Bilirubin: 0.5 mg/dL (ref 0.3–1.2)

## 2016-08-25 LAB — PROTIME-INR
INR: 1.37
PROTHROMBIN TIME: 17 s — AB (ref 11.4–15.2)

## 2016-08-25 LAB — INFLUENZA PANEL BY PCR (TYPE A & B)
INFLAPCR: NEGATIVE
INFLBPCR: POSITIVE — AB

## 2016-08-25 LAB — PROCALCITONIN

## 2016-08-25 LAB — I-STAT CG4 LACTIC ACID, ED: Lactic Acid, Venous: 1.9 mmol/L (ref 0.5–1.9)

## 2016-08-25 MED ORDER — SODIUM CHLORIDE 0.9 % IV SOLN
INTRAVENOUS | Status: DC
  Administered 2016-08-25 – 2016-08-26 (×3): via INTRAVENOUS

## 2016-08-25 MED ORDER — TRAZODONE HCL 50 MG PO TABS: 25.0000 mg | ORAL_TABLET | Freq: Every evening | ORAL | Status: DC | PRN

## 2016-08-25 MED ORDER — OSELTAMIVIR PHOSPHATE 30 MG PO CAPS
30.0000 mg | ORAL_CAPSULE | Freq: Two times a day (BID) | ORAL | Status: AC
  Administered 2016-08-27 – 2016-08-29 (×5): 30 mg via ORAL
  Filled 2016-08-25 (×6): qty 1

## 2016-08-25 MED ORDER — DONEPEZIL HCL 10 MG PO TABS
10.0000 mg | ORAL_TABLET | Freq: Every day | ORAL | Status: DC
  Administered 2016-08-27 – 2016-09-02 (×7): 10 mg via ORAL
  Filled 2016-08-25 (×8): qty 1

## 2016-08-25 MED ORDER — ACETAMINOPHEN 325 MG PO TABS
650.0000 mg | ORAL_TABLET | Freq: Four times a day (QID) | ORAL | Status: DC | PRN
  Administered 2016-08-29: 650 mg via ORAL
  Filled 2016-08-25 (×3): qty 2

## 2016-08-25 MED ORDER — BISACODYL 10 MG RE SUPP: 10.0000 mg | Freq: Every day | RECTAL | Status: DC | PRN

## 2016-08-25 MED ORDER — SODIUM CHLORIDE 0.45 % IV SOLN
INTRAVENOUS | Status: DC
  Administered 2016-08-25: 12:00:00 via INTRAVENOUS

## 2016-08-25 MED ORDER — SENNOSIDES-DOCUSATE SODIUM 8.6-50 MG PO TABS: 1.0000 | ORAL_TABLET | Freq: Every evening | ORAL | Status: DC | PRN

## 2016-08-25 MED ORDER — KETOROLAC TROMETHAMINE 15 MG/ML IJ SOLN: 15.0000 mg | Freq: Four times a day (QID) | INTRAMUSCULAR | Status: DC | PRN

## 2016-08-25 MED ORDER — SODIUM CHLORIDE 0.9 % IV BOLUS (SEPSIS)
1000.0000 mL | Freq: Once | INTRAVENOUS | Status: AC
Start: 2016-08-25 — End: 2016-08-25
  Administered 2016-08-25: 1000 mL via INTRAVENOUS

## 2016-08-25 MED ORDER — ONDANSETRON HCL 4 MG/2ML IJ SOLN: 4.0000 mg | Freq: Four times a day (QID) | INTRAMUSCULAR | Status: DC | PRN

## 2016-08-25 MED ORDER — ACETAMINOPHEN 650 MG RE SUPP
650.0000 mg | Freq: Four times a day (QID) | RECTAL | Status: DC | PRN
  Administered 2016-08-26 – 2016-08-28 (×3): 650 mg via RECTAL
  Filled 2016-08-25 (×5): qty 1

## 2016-08-25 MED ORDER — GUAIFENESIN ER 600 MG PO TB12: 600.0000 mg | ORAL_TABLET | Freq: Two times a day (BID) | ORAL | Status: DC | PRN

## 2016-08-25 MED ORDER — ENOXAPARIN SODIUM 40 MG/0.4ML ~~LOC~~ SOLN
40.0000 mg | SUBCUTANEOUS | Status: DC
  Administered 2016-08-25 – 2016-09-01 (×8): 40 mg via SUBCUTANEOUS
  Filled 2016-08-25 (×8): qty 0.4

## 2016-08-25 MED ORDER — HYDROCODONE-ACETAMINOPHEN 5-325 MG PO TABS: 1.0000 | ORAL_TABLET | ORAL | Status: DC | PRN

## 2016-08-25 MED ORDER — ONDANSETRON HCL 4 MG PO TABS: 4.0000 mg | ORAL_TABLET | Freq: Four times a day (QID) | ORAL | Status: DC | PRN

## 2016-08-25 MED ORDER — MAGNESIUM CITRATE PO SOLN: 1.0000 | Freq: Once | ORAL | Status: DC | PRN

## 2016-08-25 MED ORDER — MEMANTINE HCL 10 MG PO TABS
5.0000 mg | ORAL_TABLET | Freq: Every day | ORAL | Status: DC
  Administered 2016-08-27 – 2016-09-02 (×7): 5 mg via ORAL
  Filled 2016-08-25 (×8): qty 1

## 2016-08-25 NOTE — ED Notes (Addendum)
Heart Healthy Diet was ordered for Dinner. 

## 2016-08-25 NOTE — ED Notes (Signed)
Delay in lab draw,  Pt not in room 

## 2016-08-25 NOTE — ED Triage Notes (Signed)
Pt here with family; pt with hx of dementia increased lethargy and cough several days; pt noted to have fever this am; pt not speaking at present

## 2016-08-25 NOTE — ED Notes (Signed)
UTA lab draw from IV insertion. Mellody DanceKeith EMT to attempt phlebotomy stick

## 2016-08-25 NOTE — ED Provider Notes (Signed)
[facility]-EMERGENCY DEPT Provider Note   CSN: [id number] Arrival date & time: 08/25/16  0707     History   Chief Complaint Chief Complaint  Patient presents with  . Fever    HPI [name] is a 83 y.o. female. Chief complaint is fever and altered mental status  HPI:  This is a 83 year old female with history of dementia. Lives at home, cared for by her son who lives with her. Son states that she was in her normal state of health until Sunday. She was less active. Noted to have low-grade fever yesterday and a cough. Worsening cough and shortness of breath. She was weak today. Not able to eat or drink at home. Son states that her mouth was dry and he attempted to get her to take liquids and she really couldn't take a sip or operate distraught. He was afraid she would aspirate he tried to feed her and thus he presents her here.  Cough and fever. No vomiting. No complaints of pain. No fall or injury. Has been completely nonverbal today and globally weak.  Past Medical History:  Diagnosis Date  . Arthritis   . Dementia     Patient Active Problem List   Diagnosis Date Noted  . Palliative care encounter 11/28/2015  . DNR (do not resuscitate) 11/28/2015  . Proteus septicemia (HCC) 11/26/2015  . AKI (acute kidney injury) (HCC) 11/25/2015  . UTI (urinary tract infection) 05/24/2015  . Dementia 05/23/2015  . Hyperglycemia 05/23/2015  . Acute encephalopathy 08/31/2014  . Fall at home 07/01/2013  . Leukocytosis, unspecified 07/01/2013  . UTI (lower urinary tract infection) 06/30/2013  . Weakness generalized 06/30/2013  . Syncope 10/28/2010  . Alzheimer's dementia 10/28/2010  . Incontinence 10/28/2010    Past Surgical History:  Procedure Laterality Date  . ABDOMINAL HYSTERECTOMY      OB History    No data available       Home Medications    Prior to Admission medications   Medication Sig Start Date End Date Taking? Authorizing Provider  amoxicillin-clavulanate  (AUGMENTIN) 875-125 MG tablet Take 1 tablet by mouth 2 (two) times daily. X 9 more days 11/29/15   Ripudeep Jenna LuoK Rai, MD  cholecalciferol (VITAMIN D) 1000 units tablet Take 1,000 Units by mouth daily at 3 pm.    Historical Provider, MD  docusate sodium (COLACE) 100 MG capsule Take 100 mg by mouth daily at 3 pm.     Historical Provider, MD  donepezil (ARICEPT) 10 MG tablet Take 10 mg by mouth daily at 3 pm.     Historical Provider, MD  memantine (NAMENDA) 5 MG tablet Take 5 mg by mouth daily at 3 pm.     Historical Provider, MD    Family History Family History  Problem Relation Age of Onset  . Hypertension Mother   . Hypertension Father     Social History Social History  Substance Use Topics  . Smoking status: Never Smoker  . Smokeless tobacco: Not on file  . Alcohol use No     Allergies   Patient has no known allergies.   Review of Systems Review of Systems  Unable to perform ROS: Dementia  Leval V Caveat.   Physical Exam Updated Vital Signs BP 177/86   Pulse 102   Temp 100.4 F (38 C) (Oral)   Resp 26   SpO2 96%   Physical Exam  Constitutional: No distress.  Thin And frail-appearing. Keeps her arms folded across her chest. Minimal use of the  lower extremities since a previous stroke per son.  HENT:  Head: Normocephalic.  Mouth/Throat:    Eyes: Conjunctivae are normal. Pupils are equal, round, and reactive to light. No scleral icterus.  Neck: Normal range of motion. Neck supple. No thyromegaly present.  Cardiovascular: Normal rate and regular rhythm.  Exam reveals no gallop and no friction rub.   No murmur heard. Pulmonary/Chest: No respiratory distress. She has no wheezes. She has no rales.  Scattered rhonchi. No increased work of breathing  Abdominal: Soft. Bowel sounds are normal. She exhibits no distension. There is no tenderness. There is no rebound.  Musculoskeletal: Normal range of motion.  Neurological:  Will open eyes to voice. Moves both upper x-rays.  Minimal use of bilateral lower extremities.  Skin: Skin is warm and dry. No rash noted.  Psychiatric: She has a normal mood and affect. Her behavior is normal.     ED Treatments / Results  Labs (all labs ordered are listed, but only abnormal results are displayed) Labs Reviewed  COMPREHENSIVE METABOLIC PANEL - Abnormal; Notable for the following:       Result Value   Potassium 3.4 (*)    Chloride 97 (*)    Glucose, Bld 133 (*)    Albumin 3.3 (*)    All other components within normal limits  PROTIME-INR - Abnormal; Notable for the following:    Prothrombin Time 17.0 (*)    All other components within normal limits  URINALYSIS, ROUTINE W REFLEX MICROSCOPIC - Abnormal; Notable for the following:    APPearance CLOUDY (*)    Hgb urine dipstick MODERATE (*)    Protein, ur 30 (*)    Bacteria, UA FEW (*)    Squamous Epithelial / LPF 0-5 (*)    All other components within normal limits  INFLUENZA PANEL BY PCR (TYPE A & B) - Abnormal; Notable for the following:    Influenza B By PCR POSITIVE (*)    All other components within normal limits  CULTURE, BLOOD (ROUTINE X 2)  CULTURE, BLOOD (ROUTINE X 2)  CBC WITH DIFFERENTIAL/PLATELET  I-STAT CG4 LACTIC ACID, ED  I-STAT CG4 LACTIC ACID, ED    EKG  EKG Interpretation None       Radiology Dg Chest 2 View  Result Date: 08/25/2016 CLINICAL DATA:  Nonproductive cough, fever, and tachypneia since yesterday. Nonsmoker. History of encephalopathy. EXAM: CHEST  2 VIEW COMPARISON:  None. FINDINGS: The lungs are mildly hyperinflated. There is no focal infiltrate. There is blunting of the costophrenic angles compatible with tiny pleural effusions. The heart is normal in size. The pulmonary vascularity is not engorged. There is the mediastinum is normal in width. There is calcification in the wall of the thoracic aorta. There is multilevel degenerative disc disease of the thoracic spine. IMPRESSION: COPD. Tiny bilateral pleural effusions. No  pulmonary edema or pneumonia. Thoracic aortic atherosclerosis. Electronically Signed   By: David  [place] M.D.   On: 08/25/2016 07:58    Procedures Procedures (including critical care time)  Medications Ordered in ED Medications  sodium chloride 0.9 % bolus 1,000 mL (0 mLs Intravenous Stopped 08/25/16 1101)     Initial Impression / Assessment and Plan / ED Course  I have reviewed the triage vital signs and the nursing notes.  Pertinent labs & imaging results that were available during my care of the patient were reviewed by me and considered in my medical decision making (see chart for details).   Fever and cough with tachycardia. We'll give fluids.  Rectal Tylenol. Plan chest x-ray flu swab urine lab. With her marked alteration mental status we'll obtain head CT.  Final Clinical Impressions(s) / ED Diagnoses   Final diagnoses:  Influenza  Encephalopathy acute    New Prescriptions New Prescriptions   No medications on file     Rolland Porter, MD 08/25/16 1109

## 2016-08-25 NOTE — Care Management Note (Signed)
Case Management Note  Patient Details  Name: [name] MRN: [id number] Date of Birth: November 06, 1920  Subjective/Objective:                  From home with son and home health services. /83 y.o. female with medical history significant for Alzheimer's dementia, brought to the emergency department with 2 day history of mild cough, non-productive, along with low-grade fevers.   Action/Plan: Admit status OBERVATION (Flu Syndrome without Hypoxia and confusion ); anticipate discharge RESUMPTION OF HOME HEALTH SERVICES WITH [facility].   Expected Discharge Date:   (unsure)               Expected Discharge Plan:  Home w Home Health Services  In-House Referral:  NA  Discharge planning Services  CM Consult  Post Acute Care Choice:  Resumption of Svcs/PTA Provider Choice offered to:     DME Arranged:    DME Agency:     HH Arranged:    HH Agency:     Status of Service:  In process, will continue to follow  If discussed at Long Length of Stay Meetings, dates discussed:    Additional Comments: Pt currently active with [facility] for RN/PT services.  Resumption of care requested. [name] RumpfMary Yonjof, RN of [facility]  notified.  No DME needs identified at this time.  [name], [name] Kentner, RN 08/25/2016, 2:13 PM

## 2016-08-25 NOTE — ED Notes (Signed)
Kitchen was called and Informed that Patient will be going to [phone]

## 2016-08-25 NOTE — H&P (Signed)
History and Physical    [name]:[id number] DOB: 1920/09/06 DOA: 08/25/2016   PCP: [name], NP   Patient coming from:  home Chief Complaint: Confusion   HPI: [name] is a 83 y.o. female with medical history significant for Alzheimer's dementia, brought to the emergency department with 2 day history of mild cough, non-productive, along with low-grade fevers. Megan Salon reported that her cough was worsened today, and she was noted to be short of breath, increased weakness, unable to eat or drink at home. She was aversive to taking liquids, and was appearing distraught, increasingly confused. At home, she had fever up to 101.5. Son did not notice any  dysuria, hematuria, or frequency. No chest pain, or back pain. No rashes. No neck stiffness or headaches    ED Course:  BP 163/75 (BP Location: Right Arm)   Pulse 104   Temp 99.7 F (37.6 C) (Oral)   Resp (!) 37   SpO2 97%    potassium 3.4 glucose 133 lactic acid 1.9  WBC 8.6, Hb 12.2 and Plt 272 CT  the head negative for acute findings Influenza B positive EDP wanted to initiate Tamiflu while in the hospital, but as the patient is unable to follow commands at this time, this medicine could not be given. Chest  x-ray consistent with COPD, and a tiny bilateral pleural effusions noted. No pneumonia seen blood cultures and urine cultures are pending she received 1 L of 5E fluid, and she is now receiving rate of 100 citizen hour of IV normal saline.    Review of Systems: As per HPI otherwise 10 point review of systems obtained by her son are negative.   Past Medical History:  Diagnosis Date  . Arthritis   . Dementia     Past Surgical History:  Procedure Laterality Date  . ABDOMINAL HYSTERECTOMY      Social History Social History   Social History  . Marital status: Widowed    Spouse name: N/A  . Number of children: N/A  . Years of education: N/A   Occupational History  . Not on file.   Social History  Main Topics  . Smoking status: Never Smoker  . Smokeless tobacco: Never Used  . Alcohol use No  . Drug use: No  . Sexual activity: No   Other Topics Concern  . Not on file   Social History Narrative   Lives with son     No Known Allergies  Family History  Problem Relation Age of Onset  . Hypertension Mother   . Hypertension Father       Prior to Admission medications   Medication Sig Start Date End Date Taking? Authorizing Provider  cholecalciferol (VITAMIN D) 1000 units tablet Take 1,000 Units by mouth daily at 3 pm.   Yes Historical Provider, MD  docusate sodium (COLACE) 100 MG capsule Take 100 mg by mouth daily at 3 pm.    Yes Historical Provider, MD  donepezil (ARICEPT) 10 MG tablet Take 10 mg by mouth daily at 3 pm.    Yes Historical Provider, MD  memantine (NAMENDA) 5 MG tablet Take 5 mg by mouth daily at 3 pm.    Yes Historical Provider, MD    Physical Exam:  Vitals:   08/25/16 1115 08/25/16 1143 08/25/16 1230 08/25/16 1252  BP: 181/82  163/75 163/75  Pulse: 101 103 94 104  Resp:  (!) 38 (!) 31 (!) 37  Temp:    99.7 F (37.6 C)  TempSrc:  Oral  SpO2: 93% 96% 95% 97%   Constitutional: NAD,non interactive at this time, mouth open  Eyes: PERRL, lids and conjunctivae normal ENMT: Mucous membranes aredry, without exudate or lesions  Neck: normal, supple, no masses, no thyromegaly Respiratory: clear to auscultation bilaterally, no wheezing, no crackles. Normal respiratory effort  Cardiovascular: mildly tachy  Regular rate and rhythm, no murmurs / rubs / gallops. No extremity edema. 2+ pedal pulses. No carotid bruits.  Abdomen: Soft, no apparent  tenderness, No hepatosplenomegaly. Bowel sounds positive.  Musculoskeletal: no clubbing / cyanosis. Moves all extremities Skin: no jaundice, No lesions.  Neurologic: unable to assess, patient is non interactive at this time     Labs on Admission: I have personally reviewed following labs and imaging  studies  CBC:  Recent Labs Lab 08/25/16 0936  WBC 8.6  NEUTROABS 7.0  HGB 12.2  HCT 36.9  MCV 87.9  PLT 272    Basic Metabolic Panel:  Recent Labs Lab 08/25/16 0936  NA 139  K 3.4*  CL 97*  CO2 29  GLUCOSE 133*  BUN 13  CREATININE 0.70  CALCIUM 9.0    GFR: CrCl cannot be calculated (Unknown ideal weight.).  Liver Function Tests:  Recent Labs Lab 08/25/16 0936  AST 25  ALT 17  ALKPHOS 75  BILITOT 0.5  PROT 7.4  ALBUMIN 3.3*   No results for input(s): LIPASE, AMYLASE in the last 168 hours. No results for input(s): AMMONIA in the last 168 hours.  Coagulation Profile:  Recent Labs Lab 08/25/16 0936  INR 1.37    Cardiac Enzymes: No results for input(s): CKTOTAL, CKMB, CKMBINDEX, TROPONINI in the last 168 hours.  BNP (last 3 results) No results for input(s): PROBNP in the last 8760 hours.  HbA1C: No results for input(s): HGBA1C in the last 72 hours.  CBG: No results for input(s): GLUCAP in the last 168 hours.  Lipid Profile: No results for input(s): CHOL, HDL, LDLCALC, TRIG, CHOLHDL, LDLDIRECT in the last 72 hours.  Thyroid Function Tests: No results for input(s): TSH, T4TOTAL, FREET4, T3FREE, THYROIDAB in the last 72 hours.  Anemia Panel: No results for input(s): VITAMINB12, FOLATE, FERRITIN, TIBC, IRON, RETICCTPCT in the last 72 hours.  Urine analysis:    Component Value Date/Time   COLORURINE YELLOW 08/25/2016 0920   APPEARANCEUR CLOUDY (A) 08/25/2016 0920   LABSPEC 1.011 08/25/2016 0920   PHURINE 7.0 08/25/2016 0920   GLUCOSEU NEGATIVE 08/25/2016 0920   HGBUR MODERATE (A) 08/25/2016 0920   BILIRUBINUR NEGATIVE 08/25/2016 0920   KETONESUR NEGATIVE 08/25/2016 0920   PROTEINUR 30 (A) 08/25/2016 0920   UROBILINOGEN 1.0 05/23/2015 1955   NITRITE NEGATIVE 08/25/2016 0920   LEUKOCYTESUR NEGATIVE 08/25/2016 0920    Sepsis Labs: @LABRCNTIP (procalcitonin:4,lacticidven:4) )No results found for this or any previous visit (from the past  240 hour(s)).   Radiological Exams on Admission: Dg Chest 2 View  Result Date: 08/25/2016 CLINICAL DATA:  Nonproductive cough, fever, and tachypneia since yesterday. Nonsmoker. History of encephalopathy. EXAM: CHEST  2 VIEW COMPARISON:  None. FINDINGS: The lungs are mildly hyperinflated. There is no focal infiltrate. There is blunting of the costophrenic angles compatible with tiny pleural effusions. The heart is normal in size. The pulmonary vascularity is not engorged. There is the mediastinum is normal in width. There is calcification in the wall of the thoracic aorta. There is multilevel degenerative disc disease of the thoracic spine. IMPRESSION: COPD. Tiny bilateral pleural effusions. No pulmonary edema or pneumonia. Thoracic aortic atherosclerosis. Electronically Signed  By: David  [place] M.D.   On: 08/25/2016 07:58   Ct Head Wo Contrast  Result Date: 08/25/2016 CLINICAL DATA:  Altered mental status with increasing lethargy. Fever. EXAM: CT HEAD WITHOUT CONTRAST TECHNIQUE: Contiguous axial images were obtained from the base of the skull through the vertex without intravenous contrast. COMPARISON:  Head CT May 23, 2015 and brain MRI May 24, 2015 FINDINGS: Brain: There is moderate diffuse atrophy, stable. There is no intracranial mass, hemorrhage, extra-axial fluid collection, or midline shift. There is patchy small vessel disease throughout the centra semiovale bilaterally. There is evidence of prior infarct in the medial left occipital lobe, stable. No acute infarct is demonstrable. Vascular: There is no evident hyperdense vessel. There is calcification in the carotid siphon regions bilaterally. There is calcification in the proximal basilar artery as well as in the distal vertebral arteries. Skull: The bony calvarium appears intact. Sinuses/Orbits: There is opacification in several ethmoid air cells bilaterally. Other paranasal sinuses are clear. Orbits appear symmetric bilaterally. Other:  Mastoid air cells are clear on the right. There is opacification in several mastoid air cells on the left inferiorly, stable. IMPRESSION: Atrophy with patchy periventricular small vessel disease, stable. Prior infarct medial left occipital lobe. No acute infarct evident. No intracranial mass hemorrhage, or extra-axial fluid collection. There are foci of arterial vascular calcification. There is ethmoid sinus disease bilaterally. There is mild chronic mastoid air cell disease on the left inferiorly. Electronically Signed   By: Bretta Bang III M.D.   On: 08/25/2016 11:43    EKG: Independently reviewed.  Assessment/Plan Active Problems:   Influenza B   Alzheimer's dementia   DNR (do not resuscitate)   Flu Syndrome without Hypoxia and confusion Tmax101.5. Influenza B +  Osats 90s in RA . CXR unrevealing  WBC normal. LA 1.9 .UA neg. UA neg. Urine and blood cultures pending .CT head neg.  Bicarb 29. WBC normal. Received 1 liter bolus.   Cultures pending.    Admit to obs med-surg  Initiate Tamiflu, to take when she is more alert -IV fluids 100 cc/h  -antipyretics  Procalcitonin Continue to closely monitor   Alzheimer's Dementia Continue Namenda and Aricept   DVT prophylaxis: Lovenox   Code Status:   DNR Family Communication:  Discussed with son Disposition Plan: Expect patient to be discharged to home after condition improves Consults called:    None Admission status: Obs  Medsurg    Erice Ahles E, PA-C [facility]   08/25/2016, 1:00 PM

## 2016-08-25 NOTE — Progress Notes (Signed)
Patient noted to have pressure wounds on her sacral and upper back. Measurements to come. Madelin RearLonnie Kayren Holck, MSN, RN, [employer] EnergyCMSRN

## 2016-08-26 ENCOUNTER — Observation Stay (HOSPITAL_COMMUNITY): Payer: Medicare Other

## 2016-08-26 DIAGNOSIS — F0280 Dementia in other diseases classified elsewhere without behavioral disturbance: Secondary | ICD-10-CM | POA: Diagnosis present

## 2016-08-26 DIAGNOSIS — A419 Sepsis, unspecified organism: Secondary | ICD-10-CM | POA: Diagnosis not present

## 2016-08-26 DIAGNOSIS — D649 Anemia, unspecified: Secondary | ICD-10-CM | POA: Diagnosis present

## 2016-08-26 DIAGNOSIS — L8990 Pressure ulcer of unspecified site, unspecified stage: Secondary | ICD-10-CM | POA: Diagnosis present

## 2016-08-26 DIAGNOSIS — R509 Fever, unspecified: Secondary | ICD-10-CM | POA: Diagnosis not present

## 2016-08-26 DIAGNOSIS — J189 Pneumonia, unspecified organism: Secondary | ICD-10-CM | POA: Diagnosis not present

## 2016-08-26 DIAGNOSIS — R000 Tachycardia, unspecified: Secondary | ICD-10-CM | POA: Diagnosis present

## 2016-08-26 DIAGNOSIS — G309 Alzheimer's disease, unspecified: Secondary | ICD-10-CM | POA: Diagnosis present

## 2016-08-26 DIAGNOSIS — Z87440 Personal history of urinary (tract) infections: Secondary | ICD-10-CM | POA: Diagnosis not present

## 2016-08-26 DIAGNOSIS — Z66 Do not resuscitate: Secondary | ICD-10-CM | POA: Diagnosis present

## 2016-08-26 DIAGNOSIS — G308 Other Alzheimer's disease: Secondary | ICD-10-CM | POA: Diagnosis not present

## 2016-08-26 DIAGNOSIS — J181 Lobar pneumonia, unspecified organism: Secondary | ICD-10-CM | POA: Diagnosis not present

## 2016-08-26 DIAGNOSIS — M1990 Unspecified osteoarthritis, unspecified site: Secondary | ICD-10-CM | POA: Diagnosis present

## 2016-08-26 DIAGNOSIS — J1000 Influenza due to other identified influenza virus with unspecified type of pneumonia: Secondary | ICD-10-CM | POA: Diagnosis present

## 2016-08-26 DIAGNOSIS — J101 Influenza due to other identified influenza virus with other respiratory manifestations: Secondary | ICD-10-CM | POA: Diagnosis present

## 2016-08-26 DIAGNOSIS — Z79899 Other long term (current) drug therapy: Secondary | ICD-10-CM | POA: Diagnosis not present

## 2016-08-26 DIAGNOSIS — Z90710 Acquired absence of both cervix and uterus: Secondary | ICD-10-CM | POA: Diagnosis not present

## 2016-08-26 DIAGNOSIS — E43 Unspecified severe protein-calorie malnutrition: Secondary | ICD-10-CM | POA: Diagnosis present

## 2016-08-26 DIAGNOSIS — J208 Acute bronchitis due to other specified organisms: Secondary | ICD-10-CM

## 2016-08-26 DIAGNOSIS — G9341 Metabolic encephalopathy: Secondary | ICD-10-CM | POA: Diagnosis present

## 2016-08-26 DIAGNOSIS — E876 Hypokalemia: Secondary | ICD-10-CM | POA: Diagnosis present

## 2016-08-26 DIAGNOSIS — E871 Hypo-osmolality and hyponatremia: Secondary | ICD-10-CM | POA: Diagnosis present

## 2016-08-26 DIAGNOSIS — B9789 Other viral agents as the cause of diseases classified elsewhere: Secondary | ICD-10-CM | POA: Diagnosis not present

## 2016-08-26 DIAGNOSIS — Z8249 Family history of ischemic heart disease and other diseases of the circulatory system: Secondary | ICD-10-CM | POA: Diagnosis not present

## 2016-08-26 DIAGNOSIS — J440 Chronic obstructive pulmonary disease with acute lower respiratory infection: Secondary | ICD-10-CM | POA: Diagnosis present

## 2016-08-26 DIAGNOSIS — J159 Unspecified bacterial pneumonia: Secondary | ICD-10-CM | POA: Diagnosis not present

## 2016-08-26 DIAGNOSIS — J1008 Influenza due to other identified influenza virus with other specified pneumonia: Secondary | ICD-10-CM | POA: Diagnosis not present

## 2016-08-26 DIAGNOSIS — G9340 Encephalopathy, unspecified: Secondary | ICD-10-CM | POA: Diagnosis not present

## 2016-08-26 DIAGNOSIS — I70209 Unspecified atherosclerosis of native arteries of extremities, unspecified extremity: Secondary | ICD-10-CM | POA: Diagnosis present

## 2016-08-26 DIAGNOSIS — Z6822 Body mass index (BMI) 22.0-22.9, adult: Secondary | ICD-10-CM | POA: Diagnosis not present

## 2016-08-26 DIAGNOSIS — F0390 Unspecified dementia without behavioral disturbance: Secondary | ICD-10-CM | POA: Diagnosis not present

## 2016-08-26 LAB — COMPREHENSIVE METABOLIC PANEL
ALBUMIN: 2.9 g/dL — AB (ref 3.5–5.0)
ALT: 18 U/L (ref 14–54)
AST: 39 U/L (ref 15–41)
Alkaline Phosphatase: 63 U/L (ref 38–126)
Anion gap: 14 (ref 5–15)
BUN: 17 mg/dL (ref 6–20)
CO2: 22 mmol/L (ref 22–32)
Calcium: 8.1 mg/dL — ABNORMAL LOW (ref 8.9–10.3)
Chloride: 104 mmol/L (ref 101–111)
Creatinine, Ser: 0.7 mg/dL (ref 0.44–1.00)
GFR calc Af Amer: >60 mL/min (ref >60)
GFR calc non Af Amer: >60 mL/min (ref >60)
GLUCOSE: 155 mg/dL — AB (ref 65–99)
POTASSIUM: 3 mmol/L — AB (ref 3.5–5.1)
SODIUM: 140 mmol/L (ref 135–145)
Total Bilirubin: 0.5 mg/dL (ref 0.3–1.2)
Total Protein: 6.8 g/dL (ref 6.5–8.1)

## 2016-08-26 LAB — URINE CULTURE

## 2016-08-26 LAB — CBC
HEMATOCRIT: 40.7 % (ref 36.0–46.0)
Hemoglobin: 13.4 g/dL (ref 12.0–15.0)
MCH: 29 pg (ref 26.0–34.0)
MCHC: 32.9 g/dL (ref 30.0–36.0)
MCV: 88.1 fL (ref 78.0–100.0)
Platelets: 227 10*3/uL (ref 150–400)
RBC: 4.62 MIL/uL (ref 3.87–5.11)
RDW: 15 % (ref 11.5–15.5)
WBC: 9.7 10*3/uL (ref 4.0–10.5)

## 2016-08-26 LAB — MAGNESIUM: Magnesium: 1.8 mg/dL (ref 1.7–2.4)

## 2016-08-26 MED ORDER — SODIUM CHLORIDE 0.9 % IV SOLN
30.0000 meq | INTRAVENOUS | Status: AC
  Administered 2016-08-26 (×2): 30 meq via INTRAVENOUS
  Filled 2016-08-26 (×2): qty 15

## 2016-08-26 MED ORDER — DEXTROSE-NACL 5-0.9 % IV SOLN
INTRAVENOUS | Status: DC
  Administered 2016-08-26 – 2016-08-29 (×3): via INTRAVENOUS

## 2016-08-26 MED ORDER — DEXTROSE 5 % IV SOLN
500.0000 mg | INTRAVENOUS | Status: DC
  Administered 2016-08-26 – 2016-08-28 (×3): 500 mg via INTRAVENOUS
  Filled 2016-08-26 (×4): qty 500

## 2016-08-26 MED ORDER — HYDRALAZINE HCL 20 MG/ML IJ SOLN
5.0000 mg | Freq: Three times a day (TID) | INTRAMUSCULAR | Status: DC | PRN
  Administered 2016-08-26 – 2016-08-31 (×4): 5 mg via INTRAVENOUS
  Filled 2016-08-26 (×5): qty 1

## 2016-08-26 MED ORDER — DEXTROSE 5 % IV SOLN
1.0000 g | INTRAVENOUS | Status: DC
  Administered 2016-08-26 – 2016-08-28 (×3): 1 g via INTRAVENOUS
  Filled 2016-08-26 (×4): qty 10

## 2016-08-26 MED ORDER — ENSURE ENLIVE PO LIQD
237.0000 mL | Freq: Two times a day (BID) | ORAL | Status: DC
  Administered 2016-08-27 – 2016-09-02 (×13): 237 mL via ORAL

## 2016-08-26 MED ORDER — ACETAMINOPHEN 650 MG RE SUPP
325.0000 mg | Freq: Once | RECTAL | Status: AC
  Administered 2016-08-26: 325 mg via RECTAL
  Filled 2016-08-26: qty 1

## 2016-08-26 MED ORDER — ORAL CARE MOUTH RINSE
15.0000 mL | Freq: Two times a day (BID) | OROMUCOSAL | Status: DC
  Administered 2016-08-26 – 2016-09-02 (×15): 15 mL via OROMUCOSAL

## 2016-08-26 MED ORDER — SODIUM CHLORIDE 0.9 % IV SOLN: INTRAVENOUS | Status: DC

## 2016-08-26 MED ORDER — IPRATROPIUM-ALBUTEROL 0.5-2.5 (3) MG/3ML IN SOLN
3.0000 mL | Freq: Four times a day (QID) | RESPIRATORY_TRACT | Status: DC
  Administered 2016-08-26 (×2): 3 mL via RESPIRATORY_TRACT
  Filled 2016-08-26 (×2): qty 3

## 2016-08-26 MED ORDER — ALBUTEROL SULFATE (2.5 MG/3ML) 0.083% IN NEBU
2.5000 mg | INHALATION_SOLUTION | RESPIRATORY_TRACT | Status: DC | PRN
  Administered 2016-09-02: 2.5 mg via RESPIRATORY_TRACT
  Filled 2016-08-26: qty 3

## 2016-08-26 NOTE — Plan of Care (Signed)
Problem: Education: Goal: Knowledge of [facility] Education information/materials will improve Outcome: Progressing POC reviewed with son.

## 2016-08-26 NOTE — Progress Notes (Signed)
Pt has PO Tamiflu ordered. Pt haven't had anything by mouth since Monday. Pt's been sleepy all day, mainly responsive to pain and not speech. Pharmacy was called if there was a PO or suppository option available. Pharmacy reported none available. NP was notified of pt's swallowing and responsiveness status. Will continue to monitor.

## 2016-08-26 NOTE — Progress Notes (Signed)
Initial Nutrition Assessment  DOCUMENTATION CODES:   Severe malnutrition in context of chronic illness  INTERVENTION:   -Ensure Enlive po BID, each supplement provides 350 kcal and 20 grams of protein  NUTRITION DIAGNOSIS:   Malnutrition related to chronic illness as evidenced by energy intake < or equal to 75% for > or equal to 1 month, severe depletion of body fat, severe depletion of muscle mass.  GOAL:   Patient will meet greater than or equal to 90% of their needs  MONITOR:   PO intake, Supplement acceptance, Labs, Weight trends, Skin, I & O's  REASON FOR ASSESSMENT:   Low Braden    ASSESSMENT:   [name] is a 83 y.o. female  who presents with AMS from Flu w/ baseline dementia. Anticipate observation admission w/ quick recovery.   Pt admitted with AMS from flu.   Pt very lethargic at time of visit. Hx obtained from pt son at bedside, who is very involved in pt's care. He reports pt with good appetite up until 2 days PTA, when she became too lethargic to take PO's. Typical intake consists on one Ensure daily and "one good meal" (meat, starch, and vegetable- usually a frozen TV dinner). Pt denies any difficulty chewing or swallow foods and endorses that pt consumes a regular diet. He often rotates between Boost and Ensure and provides a variety of flavors to increase acceptance.   Pt son shares that pt has experienced gradual wt loss over the past several years, however, denies any recent wt loss. He confirms that pt has two pressure injuries on back and sacrum, which pt developed after becoming bedbound last May. He shares that pt has home health assistance for turning and cleaning wounds. Pt does not take additional vitamins or supplements for wound healing.   Nutrition-Focused physical exam completed. Findings are moderate to severe fat depletion, moderate to severe muscle depletion, and no edema.   Pt son is very knowledgeable about the role of nutrition  related to wound healing. He requests pt receive Ensure supplements during hospitalization, which RD will order. Pt son expressed appreciation for visit, but declined additional needs at this time.   Labs reviewed: K: 3.0 (on IV supplementation).   Diet Order:  Diet regular Room service appropriate? Yes; Fluid consistency: Thin  Skin:  Wound (see comment) (st II mid upper back, sacrum)  Last BM:  08/25/16  Height:   Ht Readings from Last 1 Encounters:  08/25/16 5\' 7"  (1.702 m)    Weight:   Wt Readings from Last 1 Encounters:  08/26/16 145 lb 1.6 oz (65.8 kg)    Ideal Body Weight:  61.5 kg  BMI:  Body mass index is 22.73 kg/m.  Estimated Nutritional Needs:   Kcal:  1450-1650  Protein:  65-80 grams  Fluid:  >1.4 L  EDUCATION NEEDS:   No education needs identified at this time  Tametria Aho A. Mayford KnifeWilliams, RD, LDN, CDE Pager: [phone] After hours Pager: [phone]

## 2016-08-26 NOTE — Progress Notes (Addendum)
PROGRESS NOTE  [name]  ZOX:[id number] DOB: Mar 28, 1921  DOA: 08/25/2016 PCP: [name], NP   Brief Narrative:  83 year old female, PMH of advanced dementia, her son lives with her, moves around on a wheelchair with her son's assistance, presented to ED with 2 day history of nonproductive cough, low-grade fevers/101.5, dyspnea, increased weakness, inability to eat or drink at home and worsened confusions. Admitted for influenza B with respiratory manifestations and acute encephalopathy. Slowly improving.   Assessment & Plan:   Active Problems:   Alzheimer's dementia   DNR (do not resuscitate)   Influenza B   Pressure injury of skin   1. Influenza B with acute bronchitis: Influenza B PCR positive. Initial chest x-ray 08/25/16 negative for pneumonia. Complete 5 days or course of Tamiflu and supportive treatment. Has upper airway secretions >deep suctioning by RT, when necessary bronchodilator nebulizations, repeat chest x-ray to look for development of pneumonia. Blood cultures 2: Negative to date.   2. Community-acquired pneumonia, RLL: Follow-up chest x-ray shows right lower lobe opacity compatible with pneumonia. Start IV ceftriaxone and azithromycin per focus pneumonia order set.  3. Acute Metabolic encephalopathy complicating advanced Alzheimer's dementia: Secondary to acute illness/influenza. CT head without acute findings. As per son's report, she has days when she tends to sleep a lot. Treat acute illness as above. Slowly improving. Monitor.  4. Hypokalemia: Replace IV and follow BMP. Check magnesium.  5. Elevated blood pressure: Stress response versus possible essential hypertension. When necessary IV hydralazine.   DVT prophylaxis: Lovenox Code Status: DO NOT RESUSCITATE Family Communication: Discussed extensively with patient's son at bedside. Updated care and answered questions. Disposition Plan: DC home when medically stable, possibly in the next 2-3 days. Based  upon initial presentation in this very elderly frail female patient with advanced dementia, confirmed flu with acute bronchitis and mental status changes, she meets high acuity and requires close monitoring and aggressive treatment which most likely will pan out over >2 midnights. Thereby will change the admission status from observation to inpatient.   Consultants:   None  Procedures:   None  Antimicrobials:   Tamiflu    Subjective: Patient is nonverbal. However as per son at bedside, she has improved. She was intermittently awake overnight and this morning, opening eyes. At baseline, there are days when she sleeps a lot. Some upper airway gurgling sounds noted. Of note patient is on regular consistency diet at home without coughing during eating or drinking.  Objective:  Vitals:   08/25/16 1753 08/25/16 2112 08/26/16 0613 08/26/16 0622  BP: (!) 195/86 (!) 186/81  (!) 158/105  Pulse: (!) 103 (!) 105  (!) 111  Resp: 18 (!) 32  (!) 28  Temp: (!) 100.7 F (38.2 C) 98.4 F (36.9 C)  97.9 F (36.6 C)  TempSrc: Oral Oral  Oral  SpO2: 95% 90%  91%  Weight: 63.8 kg (140 lb 9.6 oz)  65.8 kg (145 lb 1.6 oz)   Height: 5\' 7"  (1.702 m)       Intake/Output Summary (Last 24 hours) at 08/26/16 1149 Last data filed at 08/25/16 1800  Gross per 24 hour  Intake              525 ml  Output                0 ml  Net              525 ml   Filed Weights   08/25/16 1753 08/26/16 0613  Weight:  63.8 kg (140 lb 9.6 oz) 65.8 kg (145 lb 1.6 oz)    Examination:  General exam: Very elderly pleasant female, moderately built, frail, lying comfortably propped up in bed. Respiratory system: Bilateral upper airway sounds but otherwise good air entry without wheezing or rhonchi.? Occasional basal crackles. Respiratory effort normal. Cardiovascular system: S1 & S2 heard, RRR. No JVD, murmurs, rubs, gallops or clicks. No pedal edema. Gastrointestinal system: Abdomen is nondistended, soft and nontender.  No organomegaly or masses felt. Normal bowel sounds heard. Central nervous system: Somnolent, winces to noxious stimuli but nonverbal, no eye opening and does not follow commands. No focal neurological deficits. Extremities: Moves all 4 limbs symmetrically, spontaneously. Skin: No rashes, lesions or ulcers Psychiatry: Judgement and insight impaired.    Data Reviewed: I have personally reviewed following labs and imaging studies  CBC:  Recent Labs Lab 08/25/16 0936 08/26/16 0930  WBC 8.6 9.7  NEUTROABS 7.0  --   HGB 12.2 13.4  HCT 36.9 40.7  MCV 87.9 88.1  PLT 272 227   Basic Metabolic Panel:  Recent Labs Lab 08/25/16 0936 08/26/16 0930  NA 139 140  K 3.4* 3.0*  CL 97* 102  CO2 29 25  GLUCOSE 133* 94  BUN 13 15  CREATININE 0.70 0.59  CALCIUM 9.0 8.3*   GFR: Estimated Creatinine Clearance: 42.7 mL/min (by C-G formula based on SCr of 0.59 mg/dL). Liver Function Tests:  Recent Labs Lab 08/25/16 0936 08/26/16 0930  AST 25 32  ALT 17 16  ALKPHOS 75 59  BILITOT 0.5 0.4  PROT 7.4 6.6  ALBUMIN 3.3* 2.8*   No results for input(s): LIPASE, AMYLASE in the last 168 hours. No results for input(s): AMMONIA in the last 168 hours. Coagulation Profile:  Recent Labs Lab 08/25/16 0936  INR 1.37   Cardiac Enzymes: No results for input(s): CKTOTAL, CKMB, CKMBINDEX, TROPONINI in the last 168 hours. BNP (last 3 results) No results for input(s): PROBNP in the last 8760 hours. HbA1C: No results for input(s): HGBA1C in the last 72 hours. CBG: No results for input(s): GLUCAP in the last 168 hours. Lipid Profile: No results for input(s): CHOL, HDL, LDLCALC, TRIG, CHOLHDL, LDLDIRECT in the last 72 hours. Thyroid Function Tests: No results for input(s): TSH, T4TOTAL, FREET4, T3FREE, THYROIDAB in the last 72 hours. Anemia Panel: No results for input(s): VITAMINB12, FOLATE, FERRITIN, TIBC, IRON, RETICCTPCT in the last 72 hours.  Sepsis Labs:  Recent Labs Lab  08/25/16 0936 08/25/16 0954 08/26/16 0930  PROCALCITON <0.10  --   --   WBC 8.6  --  9.7  LATICACIDVEN  --  1.90  --     Recent Results (from the past 240 hour(s))  Urine culture     Status: Abnormal   Collection Time: 08/25/16  9:20 AM  Result Value Ref Range Status   Specimen Description URINE, CATHETERIZED  Final   Special Requests NONE  Final   Culture <10,000 COLONIES/mL INSIGNIFICANT GROWTH (A)  Final   Report Status 08/26/2016 FINAL  Final  Culture, blood (Routine X 2) w Reflex to ID Panel     Status: None (Preliminary result)   Collection Time: 08/25/16 10:05 AM  Result Value Ref Range Status   Specimen Description BLOOD LEFT ANTECUBITAL  Final   Special Requests BOTTLES DRAWN AEROBIC AND ANAEROBIC 10CC  Final   Culture NO GROWTH < 12 HOURS  Final   Report Status PENDING  Incomplete  Culture, blood (Routine X 2) w Reflex to ID Panel  Status: None (Preliminary result)   Collection Time: 08/25/16 10:13 AM  Result Value Ref Range Status   Specimen Description BLOOD LEFT FOREARM  Final   Special Requests BOTTLES DRAWN AEROBIC ONLY 7CC  Final   Culture NO GROWTH < 12 HOURS  Final   Report Status PENDING  Incomplete         Radiology Studies: Dg Chest 2 View  Result Date: 08/25/2016 CLINICAL DATA:  Nonproductive cough, fever, and tachypneia since yesterday. Nonsmoker. History of encephalopathy. EXAM: CHEST  2 VIEW COMPARISON:  None. FINDINGS: The lungs are mildly hyperinflated. There is no focal infiltrate. There is blunting of the costophrenic angles compatible with tiny pleural effusions. The heart is normal in size. The pulmonary vascularity is not engorged. There is the mediastinum is normal in width. There is calcification in the wall of the thoracic aorta. There is multilevel degenerative disc disease of the thoracic spine. IMPRESSION: COPD. Tiny bilateral pleural effusions. No pulmonary edema or pneumonia. Thoracic aortic atherosclerosis. Electronically Signed   By:  David  [place] M.D.   On: 08/25/2016 07:58   Ct Head Wo Contrast  Result Date: 08/25/2016 CLINICAL DATA:  Altered mental status with increasing lethargy. Fever. EXAM: CT HEAD WITHOUT CONTRAST TECHNIQUE: Contiguous axial images were obtained from the base of the skull through the vertex without intravenous contrast. COMPARISON:  Head CT May 23, 2015 and brain MRI May 24, 2015 FINDINGS: Brain: There is moderate diffuse atrophy, stable. There is no intracranial mass, hemorrhage, extra-axial fluid collection, or midline shift. There is patchy small vessel disease throughout the centra semiovale bilaterally. There is evidence of prior infarct in the medial left occipital lobe, stable. No acute infarct is demonstrable. Vascular: There is no evident hyperdense vessel. There is calcification in the carotid siphon regions bilaterally. There is calcification in the proximal basilar artery as well as in the distal vertebral arteries. Skull: The bony calvarium appears intact. Sinuses/Orbits: There is opacification in several ethmoid air cells bilaterally. Other paranasal sinuses are clear. Orbits appear symmetric bilaterally. Other: Mastoid air cells are clear on the right. There is opacification in several mastoid air cells on the left inferiorly, stable. IMPRESSION: Atrophy with patchy periventricular small vessel disease, stable. Prior infarct medial left occipital lobe. No acute infarct evident. No intracranial mass hemorrhage, or extra-axial fluid collection. There are foci of arterial vascular calcification. There is ethmoid sinus disease bilaterally. There is mild chronic mastoid air cell disease on the left inferiorly. Electronically Signed   By: Bretta Bang III M.D.   On: 08/25/2016 11:43        Scheduled Meds: . donepezil  10 mg Oral Q1500  . enoxaparin (LOVENOX) injection  40 mg Subcutaneous Q24H  . mouth rinse  15 mL Mouth Rinse BID  . memantine  5 mg Oral Q1500  . oseltamivir  30 mg Oral  BID  . potassium chloride (KCL MULTIRUN) 30 mEq in 265 mL IVPB  30 mEq Intravenous Q4H   Continuous Infusions: . sodium chloride 100 mL/hr at 08/26/16 0852     LOS: 0 days    [facility], MD [facility] Pager [phone]  If 7PM-7AM, please contact night-coverage [email or web address] Password [facility] 08/26/2016, 11:49 AM

## 2016-08-26 NOTE — Progress Notes (Signed)
Asked to be second set of eyes for patient with increasing temp and decreased LOC.  Patient supine in bed - pale - warm and dry - opens eyes to name - focus on son - not following commands - resps rapid and shallow rate 36 - on 3.5 liters with O2 sats at 100% - wet weak cough - not able to produce sputum - son reports this sounds "much better" has had audible "rattling" of resps for 6 hours per his report.  Bil BS present - coarse.  Patient with flu and now with infiltrates in lungs per recent PCXR.  Son aware of patients worsening condition- abx started per Dr. Waymon AmatoHongalgi - fever treated with Tylenol- -son understands she is very sick - does not wish for heroic measures - will follow as needed.  May NTS suction with RT at bedside if patient develops acute distress - son concerned with hx of vagal syncope - will address as needed.  Dr. Waymon AmatoHongalgi [facility].

## 2016-08-26 NOTE — Care Management Note (Signed)
Case Management Note  Patient Details  Name: [name] MRN: [id number] Date of Birth: 25-Feb-1921  Subjective/Objective:                 Spoke with patient's son at the bedside. Son states [facility] Have RN Marchelle Folksmanda that comes Mon and Fri. They also have private duty aid everyday. Patient has 24 hour supervision assistance at home. Son declines additional DME. States that hospital bed would assist with personal care as it could raise patient, however her bed is adjustable with elevated HOB; declined offer to get hosp bed. Patient with multiple wounds, non verbal, +flu B, continues to be drowsy above baseline, gurgling. Son would like to manage patient at home after discharge.    Action/Plan:  [facility] Palliative not ordered, but cannot be ruled out as possible dispo plan if consulted.    Expected Discharge Date:   (unsure)               Expected Discharge Plan:  Home w Home Health Services  In-House Referral:  NA  Discharge planning Services  CM Consult  Post Acute Care Choice:  Resumption of Svcs/PTA Provider Choice offered to:     DME Arranged:    DME Agency:     HH Arranged:    [facility]  Status of Service:  In process, will continue to follow  If discussed at Long Length of Stay Meetings, dates discussed:    Additional Comments:  Lawerance SabalDebbie Storm Sovine, RN 08/26/2016, 11:45 AM

## 2016-08-26 NOTE — Care Management Obs Status (Signed)
MEDICARE OBSERVATION STATUS NOTIFICATION   Patient Details  Name: [name] MRCoralee Rud: [id number] Date of Birth: 07/30/1920   Medicare Observation Status Notification Given:  Yes    Lawerance Sabalebbie Areana Kosanke, RN 08/26/2016, 11:45 AM

## 2016-08-26 NOTE — Progress Notes (Addendum)
Patient noted to have rhonchus breath sounds and wet very weak cough. Attempted oral suction to clear secretions and ease work of breathing per Dr. Waymon AmatoHongalgi request. Oral suction was ineffective at clearing secretions after multiple attempts. Patient noted to be diaphoretic and tachypnic. Blood pressure (!) 182/88, pulse (!) 127, temperature (!) 101.2 F (38.4 C), temperature source axillary, resp. rate (!) 46, SpO2 (!) 89 % on 5L. Paged MD regarding need for respiratory therapy including deep suction and possible breathing treatments. Sat patient upright in bed to ease work of breathing. Will administer tylenol suppository after respiratory care done and patient can be safely supine. Administered 5mg  IV hydralazine for elevated BP per new orders. Patient's son at bedside and informed of plan of care. Will continue to monitor.   At 1500 temp rechecked by nurse tech, rectal temp noted to be 104. Rechecked axillary at 1515 was 102.5. Patient continues to be lethargic and minimally responsive to pain. Dr. Waymon AmatoHongalgi notified, and RRT nurse was asked to come evaluate patient.

## 2016-08-27 DIAGNOSIS — J181 Lobar pneumonia, unspecified organism: Secondary | ICD-10-CM

## 2016-08-27 DIAGNOSIS — E43 Unspecified severe protein-calorie malnutrition: Secondary | ICD-10-CM | POA: Diagnosis present

## 2016-08-27 DIAGNOSIS — A419 Sepsis, unspecified organism: Principal | ICD-10-CM

## 2016-08-27 LAB — BASIC METABOLIC PANEL
ANION GAP: 12 (ref 5–15)
BUN: 22 mg/dL — ABNORMAL HIGH (ref 6–20)
CALCIUM: 8.3 mg/dL — AB (ref 8.9–10.3)
CO2: 25 mmol/L (ref 22–32)
Chloride: 104 mmol/L (ref 101–111)
Creatinine, Ser: 0.61 mg/dL (ref 0.44–1.00)
GFR calc non Af Amer: >60 mL/min (ref >60)
Glucose, Bld: 113 mg/dL — ABNORMAL HIGH (ref 65–99)
POTASSIUM: 3.3 mmol/L — AB (ref 3.5–5.1)
Sodium: 141 mmol/L (ref 135–145)

## 2016-08-27 LAB — PROCALCITONIN: Procalcitonin: 12.45 ng/mL

## 2016-08-27 LAB — HIV ANTIBODY (ROUTINE TESTING W REFLEX): HIV SCREEN 4TH GENERATION: NONREACTIVE

## 2016-08-27 MED ORDER — IPRATROPIUM-ALBUTEROL 0.5-2.5 (3) MG/3ML IN SOLN
3.0000 mL | Freq: Four times a day (QID) | RESPIRATORY_TRACT | Status: DC
  Administered 2016-08-27: 3 mL via RESPIRATORY_TRACT
  Filled 2016-08-27: qty 3

## 2016-08-27 MED ORDER — MORPHINE SULFATE (PF) 2 MG/ML IV SOLN
2.0000 mg | INTRAVENOUS | Status: DC | PRN
  Filled 2016-08-27: qty 1

## 2016-08-27 MED ORDER — MORPHINE SULFATE (PF) 2 MG/ML IV SOLN
1.0000 mg | INTRAVENOUS | Status: DC | PRN
  Administered 2016-08-27 – 2016-08-28 (×2): 1 mg via INTRAVENOUS
  Filled 2016-08-27 (×2): qty 1

## 2016-08-27 MED ORDER — SODIUM CHLORIDE 0.9 % IV SOLN
30.0000 meq | INTRAVENOUS | Status: AC
  Administered 2016-08-27: 30 meq via INTRAVENOUS
  Filled 2016-08-27 (×2): qty 15

## 2016-08-27 MED ORDER — IPRATROPIUM-ALBUTEROL 0.5-2.5 (3) MG/3ML IN SOLN
3.0000 mL | Freq: Two times a day (BID) | RESPIRATORY_TRACT | Status: DC
  Administered 2016-08-27 – 2016-09-02 (×12): 3 mL via RESPIRATORY_TRACT
  Filled 2016-08-27 (×12): qty 3

## 2016-08-27 NOTE — Progress Notes (Signed)
Called to bedside d/t pt SBP-180s, HR-130s, RR-40, with wet gurgly respirations.  Pt suctioned with yaunker deep in the back of the throat.  Small amount thick white secretions suctioned out of mouth.  Wet rattle still present after suctioning.  Son at bedside.  Discussed NTS.  He doesn't want this intervention at this time.  Advised RN to give PRN hydralazine for HTN and to call MD to see if a low dose IV pain med would be an option to help keep patient comfortable.  Son aware of plan.  Advised RN to call RRT if further help needed.

## 2016-08-27 NOTE — Progress Notes (Signed)
PROGRESS NOTE  [name]  ZOX:[id number] DOB: 1921/03/08  DOA: 08/25/2016 PCP: Delia Chimes, NP   Brief Narrative:  83 year old female, PMH of advanced dementia, her son lives with her, moves around on a wheelchair with her son's assistance, presented to ED with 2 day history of nonproductive cough, low-grade fevers/101.5, dyspnea, increased weakness, inability to eat or drink at home and worsened confusions. Admitted for influenza B with respiratory manifestations, pneumonia and acute encephalopathy. Slowly improving.   Assessment & Plan:   Active Problems:   Alzheimer's dementia   DNR (do not resuscitate)   Influenza B   Pressure injury of skin   Influenza due to influenza virus, type B   Protein-calorie malnutrition, severe   1. Influenza B with acute bronchitis and pneumonia: Influenza B PCR positive. Initial chest x-ray 08/25/16 negative for pneumonia. Complete 5 days or course of Tamiflu and supportive treatment. Has upper airway secretions >deep suctioning by RT, when necessary bronchodilator nebulizations, chest physical therapy. Blood cultures 2: Negative to date. Had not been able to take her Tamiflu due to being sleepy and unsafe to take by mouth. Received 2/8 morning dose.  2. Community-acquired pneumonia, RLL: Follow-up chest x-ray 08/26/16 showed right lower lobe opacity compatible with pneumonia. Started IV ceftriaxone and azithromycin per focus pneumonia order set. Blood cultures 2: Negative to date. HIV antibodies: Nonreactive. Pro-calcitonin 12.45.  3. Sepsis: Met sepsis criteria on admission with tachycardia, fever 100.4 and tachypnea. Secondary to influenza and pneumonia. Management as above. Improving. Seems to have defervesced in the last 12 hours.  4. Acute Metabolic encephalopathy complicating advanced Alzheimer's dementia: Secondary to acute illness/influenza/pneumonia complicating underlying advanced dementia. CT head without acute findings. As per son's  report, she has days when she tends to sleep a lot. Treat acute illness as above. Slowly improving. Monitor.  5. Hypokalemia: Replace IV and follow BMP. Magnesium: 1.8. Better.  6. Elevated blood pressure: Stress response versus possible essential hypertension. When necessary IV hydralazine. Improved.   DVT prophylaxis: Lovenox Code Status: DO NOT RESUSCITATE Family Communication: Discussed extensively with patient's son at bedside. Updated care and answered questions. Disposition Plan: DC home when medically stable, possibly in the next 2-3 days.    Consultants:   None  Procedures:   None  Antimicrobials:   Tamiflu    Subjective: Patient seemed more alert this morning. Intermittently moaning. As per son, did quite well last night, slept through, was awake this morning and spoke word or 2 to him this morning and indicated that she was feeling better. Subsequently went back to sleep this morning  Objective:  Vitals:   08/26/16 2055 08/26/16 2134 08/27/16 0509 08/27/16 0845  BP:  119/64 136/75   Pulse: 82 88 85   Resp:  18 19   Temp:  98.2 F (36.8 C) 97.3 F (36.3 C)   TempSrc:  Oral Oral   SpO2: 100% 100% 100% 100%  Weight:      Height:        Intake/Output Summary (Last 24 hours) at 08/27/16 1313 Last data filed at 08/27/16 1133  Gross per 24 hour  Intake          1906.66 ml  Output                0 ml  Net          1906.66 ml   Filed Weights   08/25/16 1753 08/26/16 0613  Weight: 63.8 kg (140 lb 9.6 oz) 65.8 kg (145 lb 1.6 oz)  Examination:  General exam: Very elderly pleasant female, moderately built, frail, lying comfortably propped up in bed. Respiratory system: Improved breath sounds. Still harsh bilaterally. No wheezing or rhonchi. Occasional crackles. Respiratory effort normal. Cardiovascular system: S1 & S2 heard, RRR. No JVD, murmurs, rubs, gallops or clicks. No pedal edema. Gastrointestinal system: Abdomen is nondistended, soft and nontender.  No organomegaly or masses felt. Normal bowel sounds heard. Central nervous system: Somnolent, intermittently moans. No focal neurological deficits. Extremities: Moves all 4 limbs symmetrically, spontaneously. Skin: No rashes, lesions or ulcers Psychiatry: Judgement and insight impaired.    Data Reviewed: I have personally reviewed following labs and imaging studies  CBC:  Recent Labs Lab 08/25/16 0936 08/26/16 0930  WBC 8.6 9.7  NEUTROABS 7.0  --   HGB 12.2 13.4  HCT 36.9 40.7  MCV 87.9 88.1  PLT 272 638   Basic Metabolic Panel:  Recent Labs Lab 08/25/16 0936 08/26/16 1208 08/27/16 0416  NA 139 140 141  K 3.4* 3.0* 3.3*  CL 97* 104 104  CO2 '29 22 25  '$ GLUCOSE 133* 155* 113*  BUN 13 17 22*  CREATININE 0.70 0.70 0.61  CALCIUM 9.0 8.1* 8.3*  MG  --  1.8  --    GFR: Estimated Creatinine Clearance: 42.7 mL/min (by C-G formula based on SCr of 0.61 mg/dL). Liver Function Tests:  Recent Labs Lab 08/25/16 0936 08/26/16 1208  AST 25 39  ALT 17 18  ALKPHOS 75 63  BILITOT 0.5 0.5  PROT 7.4 6.8  ALBUMIN 3.3* 2.9*   No results for input(s): LIPASE, AMYLASE in the last 168 hours. No results for input(s): AMMONIA in the last 168 hours. Coagulation Profile:  Recent Labs Lab 08/25/16 0936  INR 1.37   Cardiac Enzymes: No results for input(s): CKTOTAL, CKMB, CKMBINDEX, TROPONINI in the last 168 hours. BNP (last 3 results) No results for input(s): PROBNP in the last 8760 hours. HbA1C: No results for input(s): HGBA1C in the last 72 hours. CBG: No results for input(s): GLUCAP in the last 168 hours. Lipid Profile: No results for input(s): CHOL, HDL, LDLCALC, TRIG, CHOLHDL, LDLDIRECT in the last 72 hours. Thyroid Function Tests: No results for input(s): TSH, T4TOTAL, FREET4, T3FREE, THYROIDAB in the last 72 hours. Anemia Panel: No results for input(s): VITAMINB12, FOLATE, FERRITIN, TIBC, IRON, RETICCTPCT in the last 72 hours.  Sepsis Labs:  Recent Labs Lab  08/25/16 0936 08/25/16 0954 08/26/16 0930 08/27/16 0416  PROCALCITON <0.10  --   --  12.45  WBC 8.6  --  9.7  --   LATICACIDVEN  --  1.90  --   --     Recent Results (from the past 240 hour(s))  Urine culture     Status: Abnormal   Collection Time: 08/25/16  9:20 AM  Result Value Ref Range Status   Specimen Description URINE, CATHETERIZED  Final   Special Requests NONE  Final   Culture <10,000 COLONIES/mL INSIGNIFICANT GROWTH (A)  Final   Report Status 08/26/2016 FINAL  Final  Culture, blood (Routine X 2) w Reflex to ID Panel     Status: None (Preliminary result)   Collection Time: 08/25/16 10:05 AM  Result Value Ref Range Status   Specimen Description BLOOD LEFT ANTECUBITAL  Final   Special Requests BOTTLES DRAWN AEROBIC AND ANAEROBIC 10CC  Final   Culture NO GROWTH 2 DAYS  Final   Report Status PENDING  Incomplete  Culture, blood (Routine X 2) w Reflex to ID Panel     Status:  None (Preliminary result)   Collection Time: 08/25/16 10:13 AM  Result Value Ref Range Status   Specimen Description BLOOD LEFT FOREARM  Final   Special Requests BOTTLES DRAWN AEROBIC ONLY [place]  Final   Culture NO GROWTH 2 DAYS  Final   Report Status PENDING  Incomplete         Radiology Studies: Dg Chest Port 1 View  Result Date: 08/26/2016 CLINICAL DATA:  Wheezing, flu EXAM: PORTABLE CHEST 1 VIEW COMPARISON:  08/25/2016 FINDINGS: There is hyperinflation of the lungs compatible with COPD. Heart is normal size. Consolidation noted in the right lower lobe compatible with pneumonia. No visible effusions. Peribronchial thickening. No acute bony abnormality. IMPRESSION: COPD, bronchitic changes. Focal right lower lobe opacity compatible with pneumonia. Followup PA and lateral chest X-ray is recommended in 3-4 weeks following trial of antibiotic therapy to ensure resolution and exclude underlying malignancy. Electronically Signed   By: Rolm Baptise M.D.   On: 08/26/2016 12:17        Scheduled Meds: .  azithromycin  500 mg Intravenous Q24H  . cefTRIAXone (ROCEPHIN)  IV  1 g Intravenous Q24H  . donepezil  10 mg Oral Q1500  . enoxaparin (LOVENOX) injection  40 mg Subcutaneous Q24H  . feeding supplement (ENSURE ENLIVE)  237 mL Oral BID BM  . ipratropium-albuterol  3 mL Nebulization BID  . mouth rinse  15 mL Mouth Rinse BID  . memantine  5 mg Oral Q1500  . oseltamivir  30 mg Oral BID  . potassium chloride (KCL MULTIRUN) 30 mEq in 265 mL IVPB  30 mEq Intravenous Q4H   Continuous Infusions: . dextrose 5 % and 0.9% NaCl 100 mL/hr at 08/26/16 2204     LOS: 1 day    [facility], MD [facility] Pager [phone]  If 7PM-7AM, please contact night-coverage [email or web address] Password [email or web address] 08/27/2016, 1:13 PM

## 2016-08-28 ENCOUNTER — Inpatient Hospital Stay (HOSPITAL_COMMUNITY): Payer: Medicare Other

## 2016-08-28 DIAGNOSIS — J189 Pneumonia, unspecified organism: Secondary | ICD-10-CM

## 2016-08-28 LAB — BASIC METABOLIC PANEL
Anion gap: 10 (ref 5–15)
BUN: 26 mg/dL — AB (ref 6–20)
CALCIUM: 9 mg/dL (ref 8.9–10.3)
CO2: 26 mmol/L (ref 22–32)
CREATININE: 0.66 mg/dL (ref 0.44–1.00)
Chloride: 108 mmol/L (ref 101–111)
GFR calc non Af Amer: >60 mL/min (ref >60)
Glucose, Bld: 150 mg/dL — ABNORMAL HIGH (ref 65–99)
Potassium: 3.9 mmol/L (ref 3.5–5.1)
Sodium: 144 mmol/L (ref 135–145)

## 2016-08-28 LAB — STREP PNEUMONIAE URINARY ANTIGEN: Strep Pneumo Urinary Antigen: NEGATIVE

## 2016-08-28 NOTE — Care Management Important Message (Signed)
Important Message  Patient Details  Name: [name] MRN: [id number] Date of Birth: 11-23-1920   Medicare Important Message Given:  Yes    Lawerance Sabalebbie Kammie Scioli, RN 08/28/2016, 1:04 PM

## 2016-08-28 NOTE — Progress Notes (Signed)
PROGRESS NOTE  [name]  SEG:[id number] DOB: [birth year]  DOA: 08/25/2016 PCP: Delia Chimes, NP   Brief Narrative:  83 year old female, PMH of advanced dementia, her son lives with her, moves around on a wheelchair with her son's assistance, presented to ED with 2 day history of nonproductive cough, low-grade fevers/101.5, dyspnea, increased weakness, inability to eat or drink at home and worsened confusions. Admitted for influenza B with respiratory manifestations, pneumonia and acute encephalopathy. Still quite ill but improving in some aspects. Have been updating son on a daily basis regarding critical nature of her illness.   Assessment & Plan:   Active Problems:   Alzheimer's dementia   DNR (do not resuscitate)   Influenza B   Pressure injury of skin   Influenza due to influenza virus, type B   Protein-calorie malnutrition, severe   1. Influenza B with acute bronchitis and pneumonia: Influenza B PCR positive. Initial chest x-ray 08/25/16 negative for pneumonia. Complete 5 days or course of Tamiflu and supportive treatment. Had upper airway secretions >deep suctioning by RT, when necessary bronchodilator nebulizations, chest physical therapy. Blood cultures 2: Negative to date. Had not been able to take her Tamiflu due to being sleepy and unsafe to take by mouth. Has received couple of doses since 2/8.  2. Community-acquired pneumonia, RLL: Follow-up chest x-ray 08/26/16 showed right lower lobe opacity compatible with pneumonia. Started IV ceftriaxone and azithromycin per focussed pneumonia order set. Blood cultures 2: Negative to date. HIV antibodies: Nonreactive. Pro-calcitonin 12.45. Patient has defervesced. Chest however sounds junky. Concern regarding aspiration. Speech therapy input pending. Repeat chest x-ray 2/9: Increased bibasilar opacities, right greater than left concerning for effusions with associated atelectasis or infiltrate. Patient unable to cooperate for incentive  spirometry. Effusions not large enough when patient not distressed or hypoxic for thoracentesis.  3. Sepsis: Met sepsis criteria on admission with tachycardia, fever 100.4 and tachypnea. Secondary to influenza and pneumonia. Management as above. Improving. Seems to have defervesced. Last high fever 103.4 on 2/7 afternoon. Low-grade fever of 99.9 on 2/8 night.  4. Acute Metabolic encephalopathy complicating advanced Alzheimer's dementia: Secondary to acute illness/influenza/pneumonia complicating underlying advanced dementia. CT head without acute findings. As per son's report, she has days when she tends to sleep a lot. Treat acute illness as above. Slowly improving. Monitor. Awake and alert this morning and saying a few words.  5. Hypokalemia: Replace IV and follow BMP. Magnesium: 1.8. Replaced.  6. Elevated blood pressure: Stress response versus possible essential hypertension. When necessary IV hydralazine. Improved.   DVT prophylaxis: Lovenox Code Status: DO NOT RESUSCITATE Family Communication: Discussed extensively with patient's son at bedside. Updated care and answered questions. Disposition Plan: DC home when medically stable, possibly in the next 2-3 days.    Consultants:   None  Procedures:   None  Antimicrobials:   Tamiflu   IV azithromycin and ceftriaxone   Subjective: Overnight events 08/27/16 appreciated. Elevated blood pressures, tachypnea and wet gurgly respirations. Also concern of coughing while drinking. ST input pending. As per son, alert this morning and has been speaking to him. Did not rest well last night due to events mentioned. Breathing better this morning.  Objective:  Vitals:   08/27/16 2310 08/28/16 0623 08/28/16 0625 08/28/16 0852  BP: 104/74 117/64    Pulse: 79 (!) 123    Resp: (!) 40 20    Temp: 99.9 F (37.7 C) 99.2 F (37.3 C)    TempSrc: Rectal Oral    SpO2: 100% 100%  100%  Weight:   67.6 kg (149 lb)   Height:        Intake/Output  Summary (Last 24 hours) at 08/28/16 1329 Last data filed at 08/28/16 0600  Gross per 24 hour  Intake          2134.17 ml  Output                0 ml  Net          2134.17 ml   Filed Weights   08/25/16 1753 08/26/16 0613 08/28/16 0625  Weight: 63.8 kg (140 lb 9.6 oz) 65.8 kg (145 lb 1.6 oz) 67.6 kg (149 lb)    Examination:  General exam: Very elderly pleasant female, moderately built, frail, lying comfortably propped up in bed.Looks better than she did over the last couple of days. Respiratory system: Improved breath sounds. Still harsh bilaterally. No wheezing or rhonchi. Occasional crackles. Respiratory effort normal. Lungs do not sound congested or wet like they did 48 hours ago. Cardiovascular system: S1 & S2 heard, RRR. No JVD, murmurs, rubs, gallops or clicks. No pedal edema. Gastrointestinal system: Abdomen is nondistended, soft and nontender. No organomegaly or masses felt. Normal bowel sounds heard. Central nervous system: Alert, intermittently moans and asking not to examine her. Not oriented though. No focal neurological deficits. Extremities: Moves all 4 limbs symmetrically, spontaneously. Skin: No rashes, lesions or ulcers Psychiatry: Judgement and insight impaired.    Data Reviewed: I have personally reviewed following labs and imaging studies  CBC:  Recent Labs Lab 08/25/16 0936 08/26/16 0930  WBC 8.6 9.7  NEUTROABS 7.0  --   HGB 12.2 13.4  HCT 36.9 40.7  MCV 87.9 88.1  PLT 272 132   Basic Metabolic Panel:  Recent Labs Lab 08/25/16 0936 08/26/16 1208 08/27/16 0416 08/28/16 0527  NA 139 140 141 144  K 3.4* 3.0* 3.3* 3.9  CL 97* 104 104 108  CO2 '29 22 25 26  '$ GLUCOSE 133* 155* 113* 150*  BUN 13 17 22* 26*  CREATININE 0.70 0.70 0.61 0.66  CALCIUM 9.0 8.1* 8.3* 9.0  MG  --  1.8  --   --    GFR: Estimated Creatinine Clearance: 42.7 mL/min (by C-G formula based on SCr of 0.66 mg/dL). Liver Function Tests:  Recent Labs Lab 08/25/16 0936  08/26/16 1208  AST 25 39  ALT 17 18  ALKPHOS 75 63  BILITOT 0.5 0.5  PROT 7.4 6.8  ALBUMIN 3.3* 2.9*   No results for input(s): LIPASE, AMYLASE in the last 168 hours. No results for input(s): AMMONIA in the last 168 hours. Coagulation Profile:  Recent Labs Lab 08/25/16 0936  INR 1.37   Cardiac Enzymes: No results for input(s): CKTOTAL, CKMB, CKMBINDEX, TROPONINI in the last 168 hours. BNP (last 3 results) No results for input(s): PROBNP in the last 8760 hours. HbA1C: No results for input(s): HGBA1C in the last 72 hours. CBG: No results for input(s): GLUCAP in the last 168 hours. Lipid Profile: No results for input(s): CHOL, HDL, LDLCALC, TRIG, CHOLHDL, LDLDIRECT in the last 72 hours. Thyroid Function Tests: No results for input(s): TSH, T4TOTAL, FREET4, T3FREE, THYROIDAB in the last 72 hours. Anemia Panel: No results for input(s): VITAMINB12, FOLATE, FERRITIN, TIBC, IRON, RETICCTPCT in the last 72 hours.  Sepsis Labs:  Recent Labs Lab 08/25/16 0936 08/25/16 0954 08/26/16 0930 08/27/16 0416  PROCALCITON <0.10  --   --  12.45  WBC 8.6  --  9.7  --   LATICACIDVEN  --  1.90  --   --     Recent Results (from the past 240 hour(s))  Urine culture     Status: Abnormal   Collection Time: 08/25/16  9:20 AM  Result Value Ref Range Status   Specimen Description URINE, CATHETERIZED  Final   Special Requests NONE  Final   Culture <10,000 COLONIES/mL INSIGNIFICANT GROWTH (A)  Final   Report Status 08/26/2016 FINAL  Final  Culture, blood (Routine X 2) w Reflex to ID Panel     Status: None (Preliminary result)   Collection Time: 08/25/16 10:05 AM  Result Value Ref Range Status   Specimen Description BLOOD LEFT ANTECUBITAL  Final   Special Requests BOTTLES DRAWN AEROBIC AND ANAEROBIC 10CC  Final   Culture NO GROWTH 2 DAYS  Final   Report Status PENDING  Incomplete  Culture, blood (Routine X 2) w Reflex to ID Panel     Status: None (Preliminary result)   Collection Time:  08/25/16 10:13 AM  Result Value Ref Range Status   Specimen Description BLOOD LEFT FOREARM  Final   Special Requests BOTTLES DRAWN AEROBIC ONLY [place]  Final   Culture NO GROWTH 2 DAYS  Final   Report Status PENDING  Incomplete         Radiology Studies: Dg Chest Port 1 View  Result Date: 08/28/2016 CLINICAL DATA:  Pneumonia. EXAM: PORTABLE CHEST 1 VIEW COMPARISON:  Radiograph August 26, 2016. FINDINGS: Stable cardiomediastinal silhouette. Atherosclerosis of thoracic aorta is noted. No pneumothorax is noted. Mild central pulmonary vascular congestion is noted. Increased right basilar opacity is noted concerning for pleural effusion with associated atelectasis or infiltrate. Mildly increased left basilar opacity is also noted concerning for effusion with atelectasis or infiltrate. Bony thorax is unremarkable. IMPRESSION: Increased bibasilar opacities are noted, right greater than left, concerning for effusions with associated atelectasis or infiltrate. Aortic atherosclerosis. Electronically Signed   By: Marijo Conception, M.D.   On: 08/28/2016 08:37        Scheduled Meds: . azithromycin  500 mg Intravenous Q24H  . cefTRIAXone (ROCEPHIN)  IV  1 g Intravenous Q24H  . donepezil  10 mg Oral Q1500  . enoxaparin (LOVENOX) injection  40 mg Subcutaneous Q24H  . feeding supplement (ENSURE ENLIVE)  237 mL Oral BID BM  . ipratropium-albuterol  3 mL Nebulization BID  . mouth rinse  15 mL Mouth Rinse BID  . memantine  5 mg Oral Q1500  . oseltamivir  30 mg Oral BID   Continuous Infusions: . dextrose 5 % and 0.9% NaCl 50 mL/hr at 08/27/16 2234     LOS: 2 days    [facility], MD [facility] Pager [phone]  If 7PM-7AM, please contact night-coverage [email or web address] Password [email or web address] 08/28/2016, 1:29 PM

## 2016-08-28 NOTE — Progress Notes (Signed)
Patient has gurgling respirations. Rhonchi.  Deep oral suction performed. Small amount of thick clear white secretions obtained but patient  unable to cough or clear secretions. Discussed NTS with pt son at bedside. At this time, son is not in favor of this task. spo2 97% on 3L [place].

## 2016-08-29 ENCOUNTER — Inpatient Hospital Stay (HOSPITAL_COMMUNITY): Payer: Medicare Other

## 2016-08-29 DIAGNOSIS — B9789 Other viral agents as the cause of diseases classified elsewhere: Secondary | ICD-10-CM

## 2016-08-29 DIAGNOSIS — J189 Pneumonia, unspecified organism: Secondary | ICD-10-CM | POA: Diagnosis present

## 2016-08-29 DIAGNOSIS — I70209 Unspecified atherosclerosis of native arteries of extremities, unspecified extremity: Secondary | ICD-10-CM

## 2016-08-29 DIAGNOSIS — R509 Fever, unspecified: Secondary | ICD-10-CM

## 2016-08-29 DIAGNOSIS — J1008 Influenza due to other identified influenza virus with other specified pneumonia: Secondary | ICD-10-CM

## 2016-08-29 DIAGNOSIS — Z87440 Personal history of urinary (tract) infections: Secondary | ICD-10-CM

## 2016-08-29 DIAGNOSIS — Z8249 Family history of ischemic heart disease and other diseases of the circulatory system: Secondary | ICD-10-CM

## 2016-08-29 DIAGNOSIS — F0390 Unspecified dementia without behavioral disturbance: Secondary | ICD-10-CM

## 2016-08-29 DIAGNOSIS — J159 Unspecified bacterial pneumonia: Secondary | ICD-10-CM

## 2016-08-29 LAB — CBC
HCT: 36.6 % (ref 36.0–46.0)
Hemoglobin: 11.5 g/dL — ABNORMAL LOW (ref 12.0–15.0)
MCH: 27.8 pg (ref 26.0–34.0)
MCHC: 31.4 g/dL (ref 30.0–36.0)
MCV: 88.6 fL (ref 78.0–100.0)
PLATELETS: 210 10*3/uL (ref 150–400)
RBC: 4.13 MIL/uL (ref 3.87–5.11)
RDW: 15 % (ref 11.5–15.5)
WBC: 6.2 10*3/uL (ref 4.0–10.5)

## 2016-08-29 LAB — PROCALCITONIN: PROCALCITONIN: 3.54 ng/mL

## 2016-08-29 MED ORDER — VANCOMYCIN HCL IN DEXTROSE 1-5 GM/200ML-% IV SOLN
1000.0000 mg | INTRAVENOUS | Status: DC
  Administered 2016-08-29 – 2016-09-01 (×4): 1000 mg via INTRAVENOUS
  Filled 2016-08-29 (×5): qty 200

## 2016-08-29 MED ORDER — PIPERACILLIN-TAZOBACTAM 3.375 G IVPB
3.3750 g | Freq: Three times a day (TID) | INTRAVENOUS | Status: DC
  Administered 2016-08-29: 3.375 g via INTRAVENOUS
  Filled 2016-08-29 (×2): qty 50

## 2016-08-29 MED ORDER — DEXTROSE 5 % IV SOLN
2.0000 g | INTRAVENOUS | Status: DC
  Administered 2016-08-29 – 2016-09-01 (×4): 2 g via INTRAVENOUS
  Filled 2016-08-29 (×5): qty 2

## 2016-08-29 NOTE — Progress Notes (Signed)
Pharmacy Antibiotic Note  [name] is a 83 y.o. female admitted on 08/25/2016 with possible pneumonia 2/2 influenza infection. Pharmacy has been consulted for Vanc/Zosyn dosing. Patient was empirically started on ceftriaxone and azithromycin, plus Tamiflu (has only been able to take 3 doses thus far due to aspiration concern). Antibiotics are now being broadened to include coverage for possible aspiration pneumonia.   PCT 12.45, wbc wnl, Tmax 101.9, CrCl ~ 40 ml/min  Plan: Vancomycin 1g IV once, then Ig IV every 24 hours. Goal trough 15-20 mcg/mL Zosyn 3.375g IV q8h (4 hour infusion) Monitor clinical improvement, renal function, and vanc trough as needed Follow-up plan for azithromycin (Dr. Waymon AmatoHongalgi will address later today)   Height: 5\' 7"  (170.2 cm) Weight: 142 lb (64.4 kg) IBW/kg (Calculated) : 61.6  Temp (24hrs), Avg:99.7 F (37.6 C), Min:97.7 F (36.5 C), Max:101.9 F (38.8 C)   Recent Labs Lab 08/25/16 0936 08/25/16 0954 08/26/16 0930 08/26/16 1208 08/27/16 0416 08/28/16 0527  WBC 8.6  --  9.7  --   --   --   CREATININE 0.70  --   --  0.70 0.61 0.66  LATICACIDVEN  --  1.90  --   --   --   --     Estimated Creatinine Clearance: 42.7 mL/min (by C-G formula based on SCr of 0.66 mg/dL).    No Known Allergies  Antimicrobials this admission:  Tamiflu 2/8 >>  Azithro 2/7 >>  CTX 2/7 >> 2/9 Vanc 2/10 >> Zosyn 2/10 >>  Microbiology results:  2/6 BCx: ngtd 2/6 UCx: insig growth  2/6 Influenza PCR: (+) influenza B  Thank you for allowing pharmacy to be a part of this patient's care.  Allie BossierApryl Anderson, PharmD PGY1 Pharmacy Resident MS3 [phone], Main [phone] 08/29/2016 7:58 AM

## 2016-08-29 NOTE — Progress Notes (Signed)
PROGRESS NOTE  [name]  FUX:[id number] DOB: 02-27-1921  DOA: 08/25/2016 PCP: Delia Chimes, NP   Brief Narrative:  83 year old female, PMH of advanced dementia, her son lives with her, moves around on a wheelchair with her son's assistance, presented to ED with 2 day history of nonproductive cough, low-grade fevers/101.5, dyspnea, increased weakness, inability to eat or drink at home and worsened confusions. Admitted for influenza B with respiratory manifestations, pneumonia and acute encephalopathy. Still quite ill but improving in some aspects. Have been updating son on a daily basis regarding critical nature of her illness. Due to persistent fevers, ID consulted on 08/29/16.   Assessment & Plan:   Principal Problem:   Influenza due to influenza virus, type B Active Problems:   Alzheimer's dementia   Dementia   Hyperglycemia   DNR (do not resuscitate)   Encephalopathy acute   Pressure injury of skin   Protein-calorie malnutrition, severe   Pneumonia   Atherosclerotic peripheral vascular disease ([place])   History of recurrent UTIs   1. Influenza B with acute bronchitis and pneumonia: Influenza B PCR positive. Initial chest x-ray 08/25/16 negative for pneumonia. Complete 5 days or course of Tamiflu and supportive treatment. Had upper airway secretions >deep suctioning by RT, when necessary bronchodilator nebulizations, chest physical therapy. Blood cultures 2: Negative to date. Had not been able to take her Tamiflu due to being sleepy and unsafe to take by mouth. Has received couple of doses since 2/8. As per ID consultation 2/10, ongoing fevers may be late manifestation of her flu due to inconsistent intake of her Tamiflu.  2. Community-acquired pneumonia, RLL: Follow-up chest x-ray 08/26/16 showed right lower lobe opacity compatible with pneumonia. Started IV ceftriaxone and azithromycin per focussed pneumonia order set. Blood cultures 2: Negative to date. HIV antibodies:  Nonreactive. Pro-calcitonin 12.45. Repeat chest x-ray 2/9: Increased bibasilar opacities, right greater than left concerning for effusions with associated atelectasis or infiltrate. Patient unable to cooperate for incentive spirometry. Effusions not large enough when patient not distressed or hypoxic for thoracentesis. Speech therapy recommends dysphagia 1 diet. Recurrent fevers last night. ID consulted. Changed antibiotics to vancomycin and cefepime.  3. Sepsis: Met sepsis criteria on admission with tachycardia, fever 100.4 and tachypnea. Secondary to influenza and pneumonia. Management as above. Improving. Seems to have defervesced. Last high fever 103.4 on 2/7 afternoon. Management as above.  4. Acute Metabolic encephalopathy complicating advanced Alzheimer's dementia: Secondary to acute illness/influenza/pneumonia complicating underlying advanced dementia. CT head without acute findings. As per son's report, she has days when she tends to sleep a lot. Treat acute illness as above. Slowly improving. Mental status continues to fluctuate with her fevers.   5. Hypokalemia: Magnesium: 1.8. Replaced.  6. Elevated blood pressure: Stress response versus possible essential hypertension. When necessary IV hydralazine. Improved.  7. Anemia: Follow CBCs   DVT prophylaxis: Lovenox Code Status: DO NOT RESUSCITATE Family Communication: Discussed extensively with patient's son at bedside. Updated care and answered questions. Disposition Plan: DC home when medically stable. Not stable for discharge.   Consultants:   None  Procedures:   None  Antimicrobials:   Tamiflu   IV azithromycin and ceftriaxone   Subjective: Fever of 101.9 on 2/9 at 9 PM. As per son, issues with wet sounding breath sounds, required when necessary suctioning. Slept somewhat last night. This morning was awake for him and denied pain but felt bad. Sleeping when I saw the patient.  Objective:  Vitals:   08/29/16 0547  08/29/16 0641 08/29/16 1019 08/29/16  1510  BP: (!) 152/107   128/75  Pulse: (!) 150  (!) 111 71  Resp: (!) 28  (!) 26 (!) 22  Temp: 97.7 F (36.5 C)   97.9 F (36.6 C)  TempSrc:    Oral  SpO2: 100%  99% 100%  Weight:  64.4 kg (142 lb)    Height:        Intake/Output Summary (Last 24 hours) at 08/29/16 1559 Last data filed at 08/29/16 1000  Gross per 24 hour  Intake              220 ml  Output                0 ml  Net              220 ml   Filed Weights   08/26/16 0613 08/28/16 0625 08/29/16 0641  Weight: 65.8 kg (145 lb 1.6 oz) 67.6 kg (149 lb) 64.4 kg (142 lb)    Examination:  General exam: Very elderly pleasant female, moderately built, frail, lying comfortably propped up in bed.Looks better than she did over the last couple of days. Respiratory system: Improved breath sounds. Still harsh bilaterally. No wheezing or rhonchi. Occasional crackles. Respiratory effort normal. Lungs do not sound congested or wet like they did 48-72 hours ago. Cardiovascular system: S1 & S2 heard, RRR. No JVD, murmurs, rubs, gallops or clicks. No pedal edema. Gastrointestinal system: Abdomen is nondistended, soft and nontender. No organomegaly or masses felt. Normal bowel sounds heard. Central nervous system: Alert, intermittently moans and asking not to examine her. Not oriented though. No focal neurological deficits. Extremities: Moves all 4 limbs symmetrically, spontaneously. Skin: No rashes, lesions or ulcers Psychiatry: Judgement and insight impaired.    Data Reviewed: I have personally reviewed following labs and imaging studies  CBC:  Recent Labs Lab 08/25/16 0936 08/26/16 0930 08/29/16 0819  WBC 8.6 9.7 6.2  NEUTROABS 7.0  --   --   HGB 12.2 13.4 11.5*  HCT 36.9 40.7 36.6  MCV 87.9 88.1 88.6  PLT 272 227 350   Basic Metabolic Panel:  Recent Labs Lab 08/25/16 0936 08/26/16 1208 08/27/16 0416 08/28/16 0527  NA 139 140 141 144  K 3.4* 3.0* 3.3* 3.9  CL 97* 104 104 108    CO2 '29 22 25 26  '$ GLUCOSE 133* 155* 113* 150*  BUN 13 17 22* 26*  CREATININE 0.70 0.70 0.61 0.66  CALCIUM 9.0 8.1* 8.3* 9.0  MG  --  1.8  --   --    GFR: Estimated Creatinine Clearance: 42.7 mL/min (by C-G formula based on SCr of 0.66 mg/dL). Liver Function Tests:  Recent Labs Lab 08/25/16 0936 08/26/16 1208  AST 25 39  ALT 17 18  ALKPHOS 75 63  BILITOT 0.5 0.5  PROT 7.4 6.8  ALBUMIN 3.3* 2.9*   No results for input(s): LIPASE, AMYLASE in the last 168 hours. No results for input(s): AMMONIA in the last 168 hours. Coagulation Profile:  Recent Labs Lab 08/25/16 0936  INR 1.37   Cardiac Enzymes: No results for input(s): CKTOTAL, CKMB, CKMBINDEX, TROPONINI in the last 168 hours. BNP (last 3 results) No results for input(s): PROBNP in the last 8760 hours. HbA1C: No results for input(s): HGBA1C in the last 72 hours. CBG: No results for input(s): GLUCAP in the last 168 hours. Lipid Profile: No results for input(s): CHOL, HDL, LDLCALC, TRIG, CHOLHDL, LDLDIRECT in the last 72 hours. Thyroid Function Tests: No results for input(s):  TSH, T4TOTAL, FREET4, T3FREE, THYROIDAB in the last 72 hours. Anemia Panel: No results for input(s): VITAMINB12, FOLATE, FERRITIN, TIBC, IRON, RETICCTPCT in the last 72 hours.  Sepsis Labs:  Recent Labs Lab 08/25/16 0936 08/25/16 0954 08/26/16 0930 08/27/16 0416 08/29/16 0819  PROCALCITON <0.10  --   --  12.45 3.54  WBC 8.6  --  9.7  --  6.2  LATICACIDVEN  --  1.90  --   --   --     Recent Results (from the past 240 hour(s))  Urine culture     Status: Abnormal   Collection Time: 08/25/16  9:20 AM  Result Value Ref Range Status   Specimen Description URINE, CATHETERIZED  Final   Special Requests NONE  Final   Culture <10,000 COLONIES/mL INSIGNIFICANT GROWTH (A)  Final   Report Status 08/26/2016 FINAL  Final  Culture, blood (Routine X 2) w Reflex to ID Panel     Status: None (Preliminary result)   Collection Time: 08/25/16 10:05 AM   Result Value Ref Range Status   Specimen Description BLOOD LEFT ANTECUBITAL  Final   Special Requests BOTTLES DRAWN AEROBIC AND ANAEROBIC 10CC  Final   Culture NO GROWTH 4 DAYS  Final   Report Status PENDING  Incomplete  Culture, blood (Routine X 2) w Reflex to ID Panel     Status: None (Preliminary result)   Collection Time: 08/25/16 10:13 AM  Result Value Ref Range Status   Specimen Description BLOOD LEFT FOREARM  Final   Special Requests BOTTLES DRAWN AEROBIC ONLY [place]  Final   Culture NO GROWTH 4 DAYS  Final   Report Status PENDING  Incomplete         Radiology Studies: Dg Chest Port 1 View  Result Date: 08/29/2016 CLINICAL DATA:  Pneumonia EXAM: PORTABLE CHEST 1 VIEW COMPARISON:  Two hundred eighteen FINDINGS: There is no focal parenchymal opacity. There are small bilateral pleural effusions. The heart mediastinum are stable. There is thoracic aortic atherosclerosis. The osseous structures are unremarkable. IMPRESSION: Small bilateral pleural effusions. Electronically Signed   By: Kathreen Devoid   On: 08/29/2016 10:44   Dg Chest Port 1 View  Result Date: 08/28/2016 CLINICAL DATA:  Pneumonia. EXAM: PORTABLE CHEST 1 VIEW COMPARISON:  Radiograph August 26, 2016. FINDINGS: Stable cardiomediastinal silhouette. Atherosclerosis of thoracic aorta is noted. No pneumothorax is noted. Mild central pulmonary vascular congestion is noted. Increased right basilar opacity is noted concerning for pleural effusion with associated atelectasis or infiltrate. Mildly increased left basilar opacity is also noted concerning for effusion with atelectasis or infiltrate. Bony thorax is unremarkable. IMPRESSION: Increased bibasilar opacities are noted, right greater than left, concerning for effusions with associated atelectasis or infiltrate. Aortic atherosclerosis. Electronically Signed   By: Marijo Conception, M.D.   On: 08/28/2016 08:37        Scheduled Meds: . ceFEPime (MAXIPIME) IV  2 g Intravenous  Q24H  . donepezil  10 mg Oral Q1500  . enoxaparin (LOVENOX) injection  40 mg Subcutaneous Q24H  . feeding supplement (ENSURE ENLIVE)  237 mL Oral BID BM  . ipratropium-albuterol  3 mL Nebulization BID  . mouth rinse  15 mL Mouth Rinse BID  . memantine  5 mg Oral Q1500  . oseltamivir  30 mg Oral BID  . vancomycin  1,000 mg Intravenous Q24H   Continuous Infusions: . dextrose 5 % and 0.9% NaCl 10 mL/hr at 08/29/16 0640     LOS: 3 days    Graig Hessling, MD [facility] Pager [phone]  If 7PM-7AM, please contact night-coverage [email or web address] Password [email or web address] 08/29/2016, 3:59 PM

## 2016-08-29 NOTE — Progress Notes (Signed)
Family has refused NTS and CPT at this time.

## 2016-08-29 NOTE — Evaluation (Signed)
Clinical/Bedside Swallow Evaluation Patient Details  Name: [name] MRN: [id number] Date of Birth: 07-14-1921  Today's Date: 08/29/2016 Time: SLP Start Time (ACUTE ONLY): 1146 SLP Stop Time (ACUTE ONLY): 1158 SLP Time Calculation (min) (ACUTE ONLY): 12 min  Past Medical History:  Past Medical History:  Diagnosis Date  . Arthritis   . Dementia    Past Surgical History:  Past Surgical History:  Procedure Laterality Date  . ABDOMINAL HYSTERECTOMY     HPI:  [name] a 83 y.o.femalewith medical history significant for Alzheimer's dementia and arthritis brought to the emergency department with 2 day history of mild cough, non-productive, along with low-grade fevers. CT the head negative for acute findings. Influenza B positive. Chest x-ray consistent with COPD, and a tiny bilateral pleural effusions noted. CXR 2/9 increased bibasilar opacities are noted, right greater than left, concerning for effusions with associated atelectasis or infiltrate. Repeat CXR 2/10 small bilateral pleural effusions. BSE and follow up 11/2015 recommended reg/thin once mental status improves. Therapist discussed at length with son the eventual impact of dementia upon swallowing.  He understands that dysphagia with aspiration may eventually become problematic,   Assessment / Plan / Recommendation Clinical Impression  Pt's respirations were significantly congested at rest prior to po's. She is lethargic, increased work of breathing, oral care performed and no family at bedside. Highly suspect she is aspirating or at significant risk given current flu and deconditioning. Past admission 11/2015 SLP discussed eventual and likely nature of swallow function given her severe dementia. Family not present however RN stated that son reported pt has only been eating applesauce and drinking liquids (Ensure?). SLP downgraded diet texture to puree and continue thin. Palliative care may be appropriate. Will  follow up for education with family.       Aspiration Risk  Severe aspiration risk    Diet Recommendation Dysphagia 1 (Puree);Thin liquid   Liquid Administration via: Cup;Straw Medication Administration: Crushed with puree Supervision: Staff to assist with self feeding;Full supervision/cueing for compensatory strategies Compensations: Slow rate;Small sips/bites Postural Changes: Seated upright at 90 degrees    Other  Recommendations Oral Care Recommendations: Oral care BID   Follow up Recommendations None      Frequency and Duration min 1 x/week  1 week       Prognosis Prognosis for Safe Diet Advancement:  (fair-guarded) Barriers to Reach Goals: Cognitive deficits      Swallow Study   General HPI: [name] a 83 y.o.femalewith medical history significant for Alzheimer's dementia and arthritis brought to the emergency department with 2 day history of mild cough, non-productive, along with low-grade fevers. CT the head negative for acute findings. Influenza B positive. Chest x-ray consistent with COPD, and a tiny bilateral pleural effusions noted. CXR 2/9 increased bibasilar opacities are noted, right greater than left, concerning for effusions with associated atelectasis or infiltrate. Repeat CXR 2/10 small bilateral pleural effusions. BSE and follow up 11/2015 recommended reg/thin once mental status improves. Therapist discussed at length with son the eventual impact of dementia upon swallowing.  He understands that dysphagia with aspiration may eventually become problematic, Type of Study: Bedside Swallow Evaluation Previous Swallow Assessment:  (BSE & f/u 11/2015 reg/thin  Discussed at length with son the ) Diet Prior to this Study:  (RN reported son stated only eating applesauce and drinking) Temperature Spikes Noted: No Respiratory Status: Nasal cannula History of Recent Intubation: No Behavior/Cognition: Lethargic/Drowsy;Requires cueing Oral Cavity Assessment:  Dry Oral Care Completed by SLP: Yes Oral  Cavity - Dentition:  (dentures at bedside?) Self-Feeding Abilities: Total assist Patient Positioning: Upright in bed Baseline Vocal Quality: Normal Volitional Cough: Cognitively unable to elicit Volitional Swallow: Unable to elicit    Oral/Motor/Sensory Function Overall Oral Motor/Sensory Function: Generalized oral weakness   Ice Chips Ice chips: Not tested   Thin Liquid Thin Liquid: Impaired Presentation: Straw;Spoon;Cup Oral Phase Impairments: Reduced labial seal;Poor awareness of bolus Oral Phase Functional Implications: Left anterior spillage;Right anterior spillage Pharyngeal  Phase Impairments: Suspected delayed Swallow;Multiple swallows;Decreased hyoid-laryngeal movement    Nectar Thick Nectar Thick Liquid: Not tested   Honey Thick Honey Thick Liquid: Not tested   Puree Puree: Impaired Presentation: Spoon Oral Phase Impairments: Reduced labial seal Pharyngeal Phase Impairments: Suspected delayed Swallow;Multiple swallows   Solid   GO   Solid: Not tested        [name], [name] 08/29/2016,1:44 PM  [name] CoonsLisa [name] AbbevilleLitaker M.Ed [employer]-SLP Pager [phone]

## 2016-08-29 NOTE — Consult Note (Signed)
[facility]    Date of Admission:  08/25/2016    Day 4 azithromycin        Day 3 oseltamivir        Day 1 vancomycin        Day 1 piperacillin tazobactam       Reason for Consult: Persistent fever in setting of influenza B and pneumonia    Referring Physician: Dr. Marcellus ScottAnand Hongalgi  Principal Problem:   Influenza due to influenza virus, type B Active Problems:   Pneumonia   Encephalopathy acute   Pressure injury of skin   Protein-calorie malnutrition, severe   Alzheimer's dementia   Dementia   Hyperglycemia   DNR (do not resuscitate)   Atherosclerotic peripheral vascular disease (HCC)   History of recurrent UTIs   . azithromycin  500 mg Intravenous Q24H  . donepezil  10 mg Oral Q1500  . enoxaparin (LOVENOX) injection  40 mg Subcutaneous Q24H  . feeding supplement (ENSURE ENLIVE)  237 mL Oral BID BM  . ipratropium-albuterol  3 mL Nebulization BID  . mouth rinse  15 mL Mouth Rinse BID  . memantine  5 mg Oral Q1500  . oseltamivir  30 mg Oral BID  . piperacillin-tazobactam (ZOSYN)  IV  3.375 g Intravenous Q8H  . vancomycin  1,000 mg Intravenous Q24H    Recommendations: 1. Continue oseltamivir and vancomycin 2. Start cefepime 3. Discontinue azithromycin and piperacillin tazobactam   Assessment: Ms. [name] has influenza B possibly complicated by superimposed bacterial pneumonia. Her persistent fevers may just be a late manifestation of influenza since she was only able to start oseltamivir 2 days ago. She is at relatively high risk for multidrug resistant organisms given prior treatment for her wounds and UTIs. I will treat for possible bacterial pneumonia with vancomycin and cefepime and continue oseltamivir for now.    HPI: [name] is a 83 y.o. female with a history of dementia who was admitted on 08/25/2016 with worsening mental status and 2 days of fever and nonproductive cough. She was found to have influenza B. Chest x-ray  the day after admission showed a new right lower lobe infiltrate. Initially she was too confused to take her oral oseltamivir. She was started on empiric azithromycin and ceftriaxone. She had some improvement then started oseltamivir 2 days ago. She spiked another fever last night. Her chest x-ray on 08/28/2016 showed worsening with right greater than left infiltrates. Today her chest x-ray is much improved. She has a history of pressure sores on her buttock and legs and has been followed at the wound center by Dr. Kirtland BouchardMike [name]. She also has a history of recurrent UTIs.   Review of Systems: Review of Systems  Unable to perform ROS: Mental acuity    Past Medical History:  Diagnosis Date  . Arthritis   . Dementia     Social History  Substance Use Topics  . Smoking status: Never Smoker  . Smokeless tobacco: Never Used  . Alcohol use No    Family History  Problem Relation Age of Onset  . Hypertension Mother   . Hypertension Father    No Known Allergies  OBJECTIVE: Blood pressure (!) 152/107, pulse (!) 111, temperature 97.7 F (36.5 C), resp. rate (!) 26, height 5\' 7"  (1.702 m), weight 142 lb (64.4 kg), SpO2 99 %.  Physical Exam  Constitutional:  She is sleeping soundly. She is cachectic. No family are present.  HENT:  Edentulous.  Cardiovascular: Regular rhythm.   No murmur heard. Tachycardic.  Pulmonary/Chest:  Normal breath sounds anteriorly.  Abdominal: Soft.  Skin: No rash noted.    Lab Results Lab Results  Component Value Date   WBC 6.2 08/29/2016   HGB 11.5 (L) 08/29/2016   HCT 36.6 08/29/2016   MCV 88.6 08/29/2016   PLT 210 08/29/2016    Lab Results  Component Value Date   CREATININE 0.66 08/28/2016   BUN 26 (H) 08/28/2016   NA 144 08/28/2016   K 3.9 08/28/2016   CL 108 08/28/2016   CO2 26 08/28/2016    Lab Results  Component Value Date   ALT 18 08/26/2016   AST 39 08/26/2016   ALKPHOS 63 08/26/2016   BILITOT 0.5 08/26/2016      Microbiology: Recent Results (from the past 240 hour(s))  Urine culture     Status: Abnormal   Collection Time: 08/25/16  9:20 AM  Result Value Ref Range Status   Specimen Description URINE, CATHETERIZED  Final   Special Requests NONE  Final   Culture <10,000 COLONIES/mL INSIGNIFICANT GROWTH (A)  Final   Report Status 08/26/2016 FINAL  Final  Culture, blood (Routine X 2) w Reflex to ID Panel     Status: None (Preliminary result)   Collection Time: 08/25/16 10:05 AM  Result Value Ref Range Status   Specimen Description BLOOD LEFT ANTECUBITAL  Final   Special Requests BOTTLES DRAWN AEROBIC AND ANAEROBIC 10CC  Final   Culture NO GROWTH 4 DAYS  Final   Report Status PENDING  Incomplete  Culture, blood (Routine X 2) w Reflex to ID Panel     Status: None (Preliminary result)   Collection Time: 08/25/16 10:13 AM  Result Value Ref Range Status   Specimen Description BLOOD LEFT FOREARM  Final   Special Requests BOTTLES DRAWN AEROBIC ONLY 7CC  Final   Culture NO GROWTH 4 DAYS  Final   Report Status PENDING  Incomplete    Cliffton Asters, MD [facility] [phone] pager   [phone] cell 08/29/2016, 12:24 PM

## 2016-08-29 NOTE — Progress Notes (Signed)
Pharmacy Antibiotic Note  [name] is a 83 y.o. female admitted on 08/25/2016 with possible pneumonia 2/2 influenza infection. Pharmacy has been consulted for Vanc/Zosyn dosing. Patient was empirically started on ceftriaxone and azithromycin, plus Tamiflu (has only been able to take 3 doses thus far due to aspiration concern). Antibiotics are now being broadened to include coverage for possible pneumonia.   PCT 12.45, wbc wnl, Tmax 101.9, CrCl ~ 40 ml/min  Plan: Vancomycin 1g IV once, then Ig IV every 24 hours. Goal trough 15-20 mcg/mL Cefepime 2g IV q24h Monitor clinical improvement, renal function, and vanc trough as needed    Height: 5\' 7"  (170.2 cm) Weight: 142 lb (64.4 kg) IBW/kg (Calculated) : 61.6  Temp (24hrs), Avg:99.7 F (37.6 C), Min:97.7 F (36.5 C), Max:101.9 F (38.8 C)   Recent Labs Lab 08/25/16 0936 08/25/16 0954 08/26/16 0930 08/26/16 1208 08/27/16 0416 08/28/16 0527 08/29/16 0819  WBC 8.6  --  9.7  --   --   --  6.2  CREATININE 0.70  --   --  0.70 0.61 0.66  --   LATICACIDVEN  --  1.90  --   --   --   --   --     Estimated Creatinine Clearance: 42.7 mL/min (by C-G formula based on SCr of 0.66 mg/dL).    No Known Allergies  Antimicrobials this admission:  Tamiflu 2/8 >>  Azithro 2/7 >> 2/10 CTX 2/7 >> 2/9 Vanc 2/10 >> Zosyn 2/10 Cefepime 2/10>>  Microbiology results:  2/6 BCx: ngtd 2/6 UCx: insig growth  2/6 Influenza PCR: (+) influenza B   Arlean Hoppingorey M. Newman PiesBall, PharmD, BCPS Clinical Pharmacist [phone] 08/29/2016 12:31 PM

## 2016-08-30 LAB — CULTURE, BLOOD (ROUTINE X 2)
Culture: NO GROWTH
Culture: NO GROWTH

## 2016-08-30 MED ORDER — OSELTAMIVIR PHOSPHATE 30 MG PO CAPS
30.0000 mg | ORAL_CAPSULE | Freq: Two times a day (BID) | ORAL | Status: AC
  Administered 2016-08-30 – 2016-08-31 (×4): 30 mg via ORAL
  Filled 2016-08-30 (×4): qty 1

## 2016-08-30 MED ORDER — SCOPOLAMINE 1 MG/3DAYS TD PT72
1.0000 | MEDICATED_PATCH | TRANSDERMAL | Status: DC
  Administered 2016-08-30 – 2016-09-02 (×2): 1.5 mg via TRANSDERMAL
  Filled 2016-08-30 (×2): qty 1

## 2016-08-30 NOTE — Progress Notes (Signed)
Patient ID: [name], female   DOB: February 21, 1921, 83 y.o.   MRN: [id number]          [facility]  Date of Admission:  08/25/2016    Total days of antibiotics 5        Day 2 vancomycin        Day 2 cefepime         Principal Problem:   Influenza due to influenza virus, type B Active Problems:   Pneumonia   Encephalopathy acute   Pressure injury of skin   Protein-calorie malnutrition, severe   Alzheimer's dementia   Dementia   Hyperglycemia   DNR (do not resuscitate)   Atherosclerotic peripheral vascular disease (HCC)   History of recurrent UTIs   . ceFEPime (MAXIPIME) IV  2 g Intravenous Q24H  . donepezil  10 mg Oral Q1500  . enoxaparin (LOVENOX) injection  40 mg Subcutaneous Q24H  . feeding supplement (ENSURE ENLIVE)  237 mL Oral BID BM  . ipratropium-albuterol  3 mL Nebulization BID  . mouth rinse  15 mL Mouth Rinse BID  . memantine  5 mg Oral Q1500  . oseltamivir  30 mg Oral BID  . scopolamine  1 patch Transdermal Q72H  . vancomycin  1,000 mg Intravenous Q24H   Review of Systems: Review of Systems  Unable to perform ROS: Mental acuity    Past Medical History:  Diagnosis Date  . Arthritis   . Dementia     Social History  Substance Use Topics  . Smoking status: Never Smoker  . Smokeless tobacco: Never Used  . Alcohol use No    Family History  Problem Relation Age of Onset  . Hypertension Mother   . Hypertension Father    No Known Allergies  OBJECTIVE: Vitals:   08/29/16 1510 08/29/16 2058 08/29/16 2144 08/30/16 0526  BP: 128/75  115/79 (!) 142/93  Pulse: 71  (!) 103 64  Resp: (!) 22  18 (!) 35  Temp: 97.9 F (36.6 C)  98.2 F (36.8 C) 98.7 F (37.1 C)  TempSrc: Oral  Oral Oral  SpO2: 100% 98% 99% 98%  Weight:      Height:       Body mass index is 22.24 kg/m.  Physical Exam  Constitutional:  She is sleeping quietly in bed. Her son is visiting.  Cardiovascular: Normal rate and regular rhythm.   No  murmur heard. Pulmonary/Chest: Effort normal. She has wheezes. She has no rales.  Scattered expiratory wheezes.    Lab Results Lab Results  Component Value Date   WBC 6.2 08/29/2016   HGB 11.5 (L) 08/29/2016   HCT 36.6 08/29/2016   MCV 88.6 08/29/2016   PLT 210 08/29/2016    Lab Results  Component Value Date   CREATININE 0.66 08/28/2016   BUN 26 (H) 08/28/2016   NA 144 08/28/2016   K 3.9 08/28/2016   CL 108 08/28/2016   CO2 26 08/28/2016    Lab Results  Component Value Date   ALT 18 08/26/2016   AST 39 08/26/2016   ALKPHOS 63 08/26/2016   BILITOT 0.5 08/26/2016     Microbiology: Recent Results (from the past 240 hour(s))  Urine culture     Status: Abnormal   Collection Time: 08/25/16  9:20 AM  Result Value Ref Range Status   Specimen Description URINE, CATHETERIZED  Final   Special Requests NONE  Final   Culture <10,000 COLONIES/mL INSIGNIFICANT GROWTH (A)  Final  Report Status 08/26/2016 FINAL  Final  Culture, blood (Routine X 2) w Reflex to ID Panel     Status: None (Preliminary result)   Collection Time: 08/25/16 10:05 AM  Result Value Ref Range Status   Specimen Description BLOOD LEFT ANTECUBITAL  Final   Special Requests BOTTLES DRAWN AEROBIC AND ANAEROBIC 10CC  Final   Culture NO GROWTH 4 DAYS  Final   Report Status PENDING  Incomplete  Culture, blood (Routine X 2) w Reflex to ID Panel     Status: None (Preliminary result)   Collection Time: 08/25/16 10:13 AM  Result Value Ref Range Status   Specimen Description BLOOD LEFT FOREARM  Final   Special Requests BOTTLES DRAWN AEROBIC ONLY 7CC  Final   Culture NO GROWTH 4 DAYS  Final   Report Status PENDING  Incomplete     ASSESSMENT: She has completed therapy for influenza B. Droplet precautions can be discontinued. She is improving on therapy for presumed superimposed pneumonia. Recommend 2 more days of vancomycin and cefepime.  PLAN: 1. Continue vancomycin and cefepime for 2 more days  Cliffton AstersJohn Chalee Hirota,  MD [facility] [phone] pager   [phone] cell 08/30/2016, 1:17 PM

## 2016-08-30 NOTE — Progress Notes (Signed)
PROGRESS NOTE  [name]  LZJ:[id number] DOB: July 10, 1921  DOA: 08/25/2016 PCP: Delia Chimes, NP   Brief Narrative:  83 year old female, PMH of advanced dementia, her son lives with her, moves around on a wheelchair with her son's assistance, presented to ED with 2 day history of nonproductive cough, low-grade fevers/101.5, dyspnea, increased weakness, inability to eat or drink at home and worsened confusions. Admitted for influenza B with respiratory manifestations, pneumonia and acute encephalopathy. Still quite ill but improving in some aspects. Have been updating son on a daily basis regarding critical nature of her illness. Due to persistent fevers, ID consulted on 08/29/16. Overall progress has been slow and patient remains critically ill.   Assessment & Plan:   Principal Problem:   Influenza due to influenza virus, type B Active Problems:   Alzheimer's dementia   Dementia   Hyperglycemia   DNR (do not resuscitate)   Encephalopathy acute   Pressure injury of skin   Protein-calorie malnutrition, severe   Pneumonia   Atherosclerotic peripheral vascular disease ([place])   History of recurrent UTIs   1. Influenza B with acute bronchitis and pneumonia: Influenza B PCR positive. Initial chest x-ray 08/25/16 negative for pneumonia. Complete 5 days or course of Tamiflu and supportive treatment. Had upper airway secretions >deep suctioning by RT, when necessary bronchodilator nebulizations, chest physical therapy. Blood cultures 2: Negative to date. Had not been able to take her Tamiflu due to being sleepy and unsafe to take by mouth. Has received oral doses of Tamiflu since 2/8. As per ID consultation 2/10, ongoing fevers may be late manifestation of her flu due to inconsistent intake of her Tamiflu. No high fevers since night of 2/9.  2. Community-acquired pneumonia, RLL: Follow-up chest x-ray 08/26/16 showed right lower lobe opacity compatible with pneumonia. Started IV ceftriaxone and  azithromycin per focussed pneumonia order set. Blood cultures 2: Negative to date. HIV antibodies: Nonreactive. Pro-calcitonin 12.45. Repeat chest x-ray 2/9: Increased bibasilar opacities, right greater than left concerning for effusions with associated atelectasis or infiltrate. Patient unable to cooperate for incentive spirometry. Effusions not large enough when patient not distressed or hypoxic for thoracentesis. Speech therapy recommends dysphagia 1 diet. Recurrent fevers 2/9 night. ID consulted. Changed antibiotics to vancomycin and cefepime. Fevers have defervesced in the last 24 hours. However still with increased secretions. Added scopolamine patch. Discussed with ID.   3. Sepsis: Met sepsis criteria on admission with tachycardia, fever 100.4 and tachypnea. Secondary to influenza and pneumonia. Management as above. Improving. Seems to have defervesced. Last high fever 103.4 on 2/7 afternoon. Management as above.  4. Acute Metabolic encephalopathy complicating advanced Alzheimer's dementia: Secondary to acute illness/influenza/pneumonia complicating underlying advanced dementia. CT head without acute findings. As per son's report, she has days when she tends to sleep a lot. Treat acute illness as above. Slowly improving. Mental status continues to fluctuate with her fevers.   5. Hypokalemia: Magnesium: 1.8. Replaced.  6. Elevated blood pressure: Stress response versus possible essential hypertension. When necessary IV hydralazine. Improved.  7. Anemia: Follow CBCs periodically.   DVT prophylaxis: Lovenox Code Status: DO NOT RESUSCITATE Family Communication: Discussed extensively with patient's son at bedside. Updated care and answered questions. Disposition Plan: DC home when medically stable. Not stable for discharge.   Consultants:   None  Procedures:   None  Antimicrobials:   Tamiflu   IV azithromycin and ceftriaxone   Subjective: Fevers are down. Slept well last night.  Mental status fluctuates between awake and sleep. Tolerating applesauce and  energy drinks. Upper airway secretions.  Objective:  Vitals:   08/29/16 1510 08/29/16 2058 08/29/16 2144 08/30/16 0526  BP: 128/75  115/79 (!) 142/93  Pulse: 71  (!) 103 64  Resp: (!) 22  18 (!) 35  Temp: 97.9 F (36.6 C)  98.2 F (36.8 C) 98.7 F (37.1 C)  TempSrc: Oral  Oral Oral  SpO2: 100% 98% 99% 98%  Weight:      Height:        Intake/Output Summary (Last 24 hours) at 08/30/16 1234 Last data filed at 08/30/16 0600  Gross per 24 hour  Intake              240 ml  Output                0 ml  Net              240 ml   Filed Weights   08/26/16 0613 08/28/16 0625 08/29/16 0641  Weight: 65.8 kg (145 lb 1.6 oz) 67.6 kg (149 lb) 64.4 kg (142 lb)    Examination:  General exam: Very elderly pleasant female, moderately built, frail, lying comfortably propped up in bed. Respiratory system: Overall improved breath sounds compared to 3-4 days ago. Still upper airway wet sounding breath sounds. Slightly diminished in the bases. Occasional basal crackles. No wheezing or rhonchi. Mildly tachypnea. Cardiovascular system: S1 & S2 heard, RRR. No JVD, murmurs, rubs, gallops or clicks. No pedal edema. Gastrointestinal system: Abdomen is nondistended, soft and nontender. No organomegaly or masses felt. Normal bowel sounds heard. Central nervous system: Sleeping again this morning but when son tries to wake her up, mumbles and moans. No focal neurological deficits. Extremities: Moves all 4 limbs symmetrically, spontaneously. Skin: No rashes, lesions or ulcers Psychiatry: Judgement and insight impaired.    Data Reviewed: I have personally reviewed following labs and imaging studies  CBC:  Recent Labs Lab 08/25/16 0936 08/26/16 0930 08/29/16 0819  WBC 8.6 9.7 6.2  NEUTROABS 7.0  --   --   HGB 12.2 13.4 11.5*  HCT 36.9 40.7 36.6  MCV 87.9 88.1 88.6  PLT 272 227 761   Basic Metabolic Panel:  Recent  Labs Lab 08/25/16 0936 08/26/16 1208 08/27/16 0416 08/28/16 0527  NA 139 140 141 144  K 3.4* 3.0* 3.3* 3.9  CL 97* 104 104 108  CO2 '29 22 25 26  '$ GLUCOSE 133* 155* 113* 150*  BUN 13 17 22* 26*  CREATININE 0.70 0.70 0.61 0.66  CALCIUM 9.0 8.1* 8.3* 9.0  MG  --  1.8  --   --    GFR: Estimated Creatinine Clearance: 42.7 mL/min (by C-G formula based on SCr of 0.66 mg/dL). Liver Function Tests:  Recent Labs Lab 08/25/16 0936 08/26/16 1208  AST 25 39  ALT 17 18  ALKPHOS 75 63  BILITOT 0.5 0.5  PROT 7.4 6.8  ALBUMIN 3.3* 2.9*   No results for input(s): LIPASE, AMYLASE in the last 168 hours. No results for input(s): AMMONIA in the last 168 hours. Coagulation Profile:  Recent Labs Lab 08/25/16 0936  INR 1.37   Cardiac Enzymes: No results for input(s): CKTOTAL, CKMB, CKMBINDEX, TROPONINI in the last 168 hours. BNP (last 3 results) No results for input(s): PROBNP in the last 8760 hours. HbA1C: No results for input(s): HGBA1C in the last 72 hours. CBG: No results for input(s): GLUCAP in the last 168 hours. Lipid Profile: No results for input(s): CHOL, HDL, LDLCALC, TRIG, CHOLHDL, LDLDIRECT in the  last 72 hours. Thyroid Function Tests: No results for input(s): TSH, T4TOTAL, FREET4, T3FREE, THYROIDAB in the last 72 hours. Anemia Panel: No results for input(s): VITAMINB12, FOLATE, FERRITIN, TIBC, IRON, RETICCTPCT in the last 72 hours.  Sepsis Labs:  Recent Labs Lab 08/25/16 0936 08/25/16 0954 08/26/16 0930 08/27/16 0416 08/29/16 0819  PROCALCITON <0.10  --   --  12.45 3.54  WBC 8.6  --  9.7  --  6.2  LATICACIDVEN  --  1.90  --   --   --     Recent Results (from the past 240 hour(s))  Urine culture     Status: Abnormal   Collection Time: 08/25/16  9:20 AM  Result Value Ref Range Status   Specimen Description URINE, CATHETERIZED  Final   Special Requests NONE  Final   Culture <10,000 COLONIES/mL INSIGNIFICANT GROWTH (A)  Final   Report Status 08/26/2016 FINAL   Final  Culture, blood (Routine X 2) w Reflex to ID Panel     Status: None (Preliminary result)   Collection Time: 08/25/16 10:05 AM  Result Value Ref Range Status   Specimen Description BLOOD LEFT ANTECUBITAL  Final   Special Requests BOTTLES DRAWN AEROBIC AND ANAEROBIC 10CC  Final   Culture NO GROWTH 4 DAYS  Final   Report Status PENDING  Incomplete  Culture, blood (Routine X 2) w Reflex to ID Panel     Status: None (Preliminary result)   Collection Time: 08/25/16 10:13 AM  Result Value Ref Range Status   Specimen Description BLOOD LEFT FOREARM  Final   Special Requests BOTTLES DRAWN AEROBIC ONLY [place]  Final   Culture NO GROWTH 4 DAYS  Final   Report Status PENDING  Incomplete         Radiology Studies: Dg Chest Port 1 View  Result Date: 08/29/2016 CLINICAL DATA:  Pneumonia EXAM: PORTABLE CHEST 1 VIEW COMPARISON:  Two hundred eighteen FINDINGS: There is no focal parenchymal opacity. There are small bilateral pleural effusions. The heart mediastinum are stable. There is thoracic aortic atherosclerosis. The osseous structures are unremarkable. IMPRESSION: Small bilateral pleural effusions. Electronically Signed   By: Kathreen Devoid   On: 08/29/2016 10:44        Scheduled Meds: . ceFEPime (MAXIPIME) IV  2 g Intravenous Q24H  . donepezil  10 mg Oral Q1500  . enoxaparin (LOVENOX) injection  40 mg Subcutaneous Q24H  . feeding supplement (ENSURE ENLIVE)  237 mL Oral BID BM  . ipratropium-albuterol  3 mL Nebulization BID  . mouth rinse  15 mL Mouth Rinse BID  . memantine  5 mg Oral Q1500  . scopolamine  1 patch Transdermal Q72H  . vancomycin  1,000 mg Intravenous Q24H   Continuous Infusions: . dextrose 5 % and 0.9% NaCl 10 mL/hr at 08/29/16 0640     LOS: 4 days    [facility], MD [facility] Pager [phone]  If 7PM-7AM, please contact night-coverage [email or web address] Password [facility] 08/30/2016, 12:34 PM

## 2016-08-31 DIAGNOSIS — E870 Hyperosmolality and hypernatremia: Secondary | ICD-10-CM

## 2016-08-31 LAB — CBC
HEMATOCRIT: 35.9 % — AB (ref 36.0–46.0)
Hemoglobin: 11.3 g/dL — ABNORMAL LOW (ref 12.0–15.0)
MCH: 28.3 pg (ref 26.0–34.0)
MCHC: 31.5 g/dL (ref 30.0–36.0)
MCV: 90 fL (ref 78.0–100.0)
PLATELETS: 274 10*3/uL (ref 150–400)
RBC: 3.99 MIL/uL (ref 3.87–5.11)
RDW: 15 % (ref 11.5–15.5)
WBC: 8.6 10*3/uL (ref 4.0–10.5)

## 2016-08-31 LAB — BASIC METABOLIC PANEL
ANION GAP: 10 (ref 5–15)
BUN: 23 mg/dL — AB (ref 6–20)
CO2: 27 mmol/L (ref 22–32)
Calcium: 9.1 mg/dL (ref 8.9–10.3)
Chloride: 110 mmol/L (ref 101–111)
Creatinine, Ser: 0.47 mg/dL (ref 0.44–1.00)
GFR calc Af Amer: >60 mL/min (ref >60)
GLUCOSE: 104 mg/dL — AB (ref 65–99)
POTASSIUM: 3.9 mmol/L (ref 3.5–5.1)
Sodium: 147 mmol/L — ABNORMAL HIGH (ref 135–145)

## 2016-08-31 MED ORDER — DEXTROSE 5 % IV SOLN
INTRAVENOUS | Status: AC
  Administered 2016-08-31 (×2): via INTRAVENOUS

## 2016-08-31 NOTE — Progress Notes (Signed)
         Date: 08/31/2016  Patient name: [name]  Medical record number: [id number]  Date of birth: Feb 08, 1921    Patient afebrile. Would give one more day of vancomycin and cefepime and then DC them. We will sign off for now. Please call with further questions.    Paulette BlanchCornelius Van Dam 08/31/2016, 9:00 AM

## 2016-08-31 NOTE — Progress Notes (Signed)
PROGRESS NOTE  [name]  JQG:[id number] DOB: Jun 23, 1921  DOA: 08/25/2016 PCP: Delia Chimes, NP   Brief Narrative:  83 year old female, PMH of advanced dementia, her son lives with her, moves around on a wheelchair with her son's assistance, presented to ED with 2 day history of nonproductive cough, low-grade fevers/101.5, dyspnea, increased weakness, inability to eat or drink at home and worsened confusions. Admitted for influenza B with respiratory manifestations, pneumonia and acute encephalopathy. Still quite ill but improving in some aspects. Have been updating son on a daily basis regarding critical nature of her illness. Due to persistent fevers, ID consulted on 08/29/16. Overall progress has been slow. Patient expected to complete IV antibiotics and Tamiflu on 2/13 then likely home with son (not interested in SNF).   Assessment & Plan:   Principal Problem:   Influenza due to influenza virus, type B Active Problems:   Alzheimer's dementia   Dementia   Hyperglycemia   DNR (do not resuscitate)   Encephalopathy acute   Pressure injury of skin   Protein-calorie malnutrition, severe   PNA (pneumonia)   Atherosclerotic peripheral vascular disease ([place])   History of recurrent UTIs   1. Influenza B with acute bronchitis and pneumonia: Influenza B PCR positive. Initial chest x-ray 08/25/16 negative for pneumonia. Complete 5 days or course of Tamiflu and supportive treatment. Had upper airway secretions >deep suctioning by RT, when necessary bronchodilator nebulizations, chest physical therapy. Blood cultures 2: Negative to date. Had not been able to take her Tamiflu due to being sleepy and unsafe to take by mouth. Has received oral doses of Tamiflu since 2/8. As per ID consultation 2/10, ongoing fevers may be late manifestation of her flu due to inconsistent intake of her Tamiflu. No high fevers since night of 2/9. Due to earlier missed doses, extended course of Tamiflu to complete  total 5 days.  2. Community-acquired pneumonia, RLL: Follow-up chest x-ray 08/26/16 showed right lower lobe opacity compatible with pneumonia. Started IV ceftriaxone and azithromycin per focussed pneumonia order set. Blood cultures 2: Negative to date. HIV antibodies: Nonreactive. Pro-calcitonin 12.45. Repeat chest x-ray 2/9: Increased bibasilar opacities, right greater than left concerning for effusions with associated atelectasis or infiltrate. Patient unable to cooperate for incentive spirometry. Effusions not large enough when patient not distressed or hypoxic for thoracentesis. Speech therapy recommends dysphagia 1 diet. Recurrent fevers 2/9 night. ID consulted. Changed antibiotics to vancomycin and cefepime. Fevers have defervesced in the last 48 hours. However still with increased secretions. Added scopolamine patch. Secretions better. As per ID follow-up, complete additional 24 hours of IV antibiotics then stop. Overall respiratory status has gradually improved.  3. Sepsis: Met sepsis criteria on admission with tachycardia, fever 100.4 and tachypnea. Secondary to influenza and pneumonia. Management as above. Improving. Seems to have defervesced. Last high fever 103.4 on 2/7 afternoon. Management as above.  4. Acute Metabolic encephalopathy complicating advanced Alzheimer's dementia: Secondary to acute illness/influenza/pneumonia complicating underlying advanced dementia. CT head without acute findings. As per son's report, she has days when she tends to sleep a lot. Treat acute illness as above. Slowly improving.   5. Hypokalemia: Magnesium: 1.8. Replaced.  6. Elevated blood pressure: Stress response versus possible essential hypertension. When necessary IV hydralazine. Improved. Added amlodipine 2.5 MG daily.  7. Anemia: Follow CBCs periodically.  8.  Mild hyponatremia. Possibly volume depletion from decreased oral intake. Brief IV fluids. Follow BMP   DVT prophylaxis: Lovenox Code Status:  DO NOT RESUSCITATE Family Communication: Discussed extensively with patient's son  at bedside. Updated care and answered questions. Disposition Plan: DC home when medically stable,? 2/13. Patient's son is not interested in SNF and states that he would like to take his mother back home where she has lived for 27 years.   Consultants:   Infectious disease   Procedures:   None  Antimicrobials:   Tamiflu   IV azithromycin and ceftriaxone   Subjective: No high fevers since 08/28/16. As per son, has been drinking ensure and eating applesauce. Slept well last night. Breathing fluctuates but overall better.  Objective:  Vitals:   08/30/16 2141 08/31/16 0531 08/31/16 0634 08/31/16 0924  BP: (!) 150/89 (!) 193/108 (!) 161/81   Pulse: 99 (!) 110 (!) 107   Resp: (!) 34 (!) 32    Temp: 98.2 F (36.8 C) 97.8 F (36.6 C)    TempSrc: Oral Oral    SpO2: 99% 97%  98%  Weight:      Height:       No intake or output data in the 24 hours ending 08/31/16 1134 Filed Weights   08/26/16 0613 08/28/16 0625 08/29/16 0641  Weight: 65.8 kg (145 lb 1.6 oz) 67.6 kg (149 lb) 64.4 kg (142 lb)    Examination:  General exam: Very elderly pleasant female, moderately built, frail, lying comfortably propped up in bed. Respiratory system: Some upper airway sounds but otherwise clear to auscultation. No increased work of breathing Cardiovascular system: S1 & S2 heard, RRR. No JVD, murmurs, rubs, gallops or clicks. No pedal edema. Gastrointestinal system: Abdomen is nondistended, soft and nontender. No organomegaly or masses felt. Normal bowel sounds heard. Central nervous system: Sleeping again this morning but when son tries to wake her up, mumbles and moans. No focal neurological deficits. Extremities: Moves all 4 limbs symmetrically, spontaneously. Skin: No rashes, lesions or ulcers Psychiatry: Judgement and insight impaired.    Data Reviewed: I have personally reviewed following labs and imaging  studies  CBC:  Recent Labs Lab 08/25/16 0936 08/26/16 0930 08/29/16 0819 08/31/16 0603  WBC 8.6 9.7 6.2 8.6  NEUTROABS 7.0  --   --   --   HGB 12.2 13.4 11.5* 11.3*  HCT 36.9 40.7 36.6 35.9*  MCV 87.9 88.1 88.6 90.0  PLT 272 227 210 381   Basic Metabolic Panel:  Recent Labs Lab 08/25/16 0936 08/26/16 1208 08/27/16 0416 08/28/16 0527 08/31/16 0603  NA 139 140 141 144 147*  K 3.4* 3.0* 3.3* 3.9 3.9  CL 97* 104 104 108 110  CO2 _0 GLUCOSE 133* 155* 113* 150* 104*  BUN 13 17 22* 26* 23*  CREATININE 0.70 0.70 0.61 0.66 0.47  CALCIUM 9.0 8.1* 8.3* 9.0 9.1  MG  --  1.8  --   --   --    GFR: Estimated Creatinine Clearance: 42.7 mL/min (by C-G formula based on SCr of 0.47 mg/dL). Liver Function Tests:  Recent Labs Lab 08/25/16 0936 08/26/16 1208  AST 25 39  ALT 17 18  ALKPHOS 75 63  BILITOT 0.5 0.5  PROT 7.4 6.8  ALBUMIN 3.3* 2.9*   No results for input(s): LIPASE, AMYLASE in the last 168 hours. No results for input(s): AMMONIA in the last 168 hours. Coagulation Profile:  Recent Labs Lab 08/25/16 0936  INR 1.37   Cardiac Enzymes: No results for input(s): CKTOTAL, CKMB, CKMBINDEX, TROPONINI in the last 168 hours. BNP (last 3 results) No results for input(s): PROBNP in the last 8760 hours. HbA1C: No results for input(s):  HGBA1C in the last 72 hours. CBG: No results for input(s): GLUCAP in the last 168 hours. Lipid Profile: No results for input(s): CHOL, HDL, LDLCALC, TRIG, CHOLHDL, LDLDIRECT in the last 72 hours. Thyroid Function Tests: No results for input(s): TSH, T4TOTAL, FREET4, T3FREE, THYROIDAB in the last 72 hours. Anemia Panel: No results for input(s): VITAMINB12, FOLATE, FERRITIN, TIBC, IRON, RETICCTPCT in the last 72 hours.  Sepsis Labs:  Recent Labs Lab 08/25/16 0936 08/25/16 0954 08/26/16 0930 08/27/16 0416 08/29/16 0819 08/31/16 0603  PROCALCITON <0.10  --   --  12.45 3.54  --   WBC 8.6  --  9.7  --  6.2 8.6    LATICACIDVEN  --  1.90  --   --   --   --     Recent Results (from the past 240 hour(s))  Urine culture     Status: Abnormal   Collection Time: 08/25/16  9:20 AM  Result Value Ref Range Status   Specimen Description URINE, CATHETERIZED  Final   Special Requests NONE  Final   Culture <10,000 COLONIES/mL INSIGNIFICANT GROWTH (A)  Final   Report Status 08/26/2016 FINAL  Final  Culture, blood (Routine X 2) w Reflex to ID Panel     Status: None   Collection Time: 08/25/16 10:05 AM  Result Value Ref Range Status   Specimen Description BLOOD LEFT ANTECUBITAL  Final   Special Requests BOTTLES DRAWN AEROBIC AND ANAEROBIC 10CC  Final   Culture NO GROWTH 5 DAYS  Final   Report Status 08/30/2016 FINAL  Final  Culture, blood (Routine X 2) w Reflex to ID Panel     Status: None   Collection Time: 08/25/16 10:13 AM  Result Value Ref Range Status   Specimen Description BLOOD LEFT FOREARM  Final   Special Requests BOTTLES DRAWN AEROBIC ONLY [place]  Final   Culture NO GROWTH 5 DAYS  Final   Report Status 08/30/2016 FINAL  Final         Radiology Studies: No results found.      Scheduled Meds: . ceFEPime (MAXIPIME) IV  2 g Intravenous Q24H  . donepezil  10 mg Oral Q1500  . enoxaparin (LOVENOX) injection  40 mg Subcutaneous Q24H  . feeding supplement (ENSURE ENLIVE)  237 mL Oral BID BM  . ipratropium-albuterol  3 mL Nebulization BID  . mouth rinse  15 mL Mouth Rinse BID  . memantine  5 mg Oral Q1500  . oseltamivir  30 mg Oral BID  . scopolamine  1 patch Transdermal Q72H  . vancomycin  1,000 mg Intravenous Q24H   Continuous Infusions: . dextrose 5 % and 0.9% NaCl 10 mL/hr at 08/29/16 0640     LOS: 5 days    [facility], MD [facility] Pager [phone]  If 7PM-7AM, please contact night-coverage [email or web address] Password [facility] 08/31/2016, 11:34 AM

## 2016-09-01 DIAGNOSIS — J101 Influenza due to other identified influenza virus with other respiratory manifestations: Secondary | ICD-10-CM

## 2016-09-01 LAB — BASIC METABOLIC PANEL
ANION GAP: 10 (ref 5–15)
BUN: 23 mg/dL — AB (ref 6–20)
CALCIUM: 8.7 mg/dL — AB (ref 8.9–10.3)
CO2: 29 mmol/L (ref 22–32)
CREATININE: 0.54 mg/dL (ref 0.44–1.00)
Chloride: 104 mmol/L (ref 101–111)
GFR calc Af Amer: >60 mL/min (ref >60)
GLUCOSE: 99 mg/dL (ref 65–99)
Potassium: 3.2 mmol/L — ABNORMAL LOW (ref 3.5–5.1)
Sodium: 143 mmol/L (ref 135–145)

## 2016-09-01 LAB — CBC
HEMATOCRIT: 31.9 % — AB (ref 36.0–46.0)
Hemoglobin: 10.1 g/dL — ABNORMAL LOW (ref 12.0–15.0)
MCH: 28.1 pg (ref 26.0–34.0)
MCHC: 31.7 g/dL (ref 30.0–36.0)
MCV: 88.6 fL (ref 78.0–100.0)
PLATELETS: 290 10*3/uL (ref 150–400)
RBC: 3.6 MIL/uL — ABNORMAL LOW (ref 3.87–5.11)
RDW: 14.8 % (ref 11.5–15.5)
WBC: 6.3 10*3/uL (ref 4.0–10.5)

## 2016-09-01 MED ORDER — ADULT MULTIVITAMIN W/MINERALS CH
1.0000 | ORAL_TABLET | Freq: Every day | ORAL | Status: DC
  Administered 2016-09-01 – 2016-09-02 (×2): 1 via ORAL
  Filled 2016-09-01 (×2): qty 1

## 2016-09-01 MED ORDER — POTASSIUM CHLORIDE 20 MEQ/15ML (10%) PO SOLN
40.0000 meq | Freq: Two times a day (BID) | ORAL | Status: AC
  Administered 2016-09-01 (×2): 40 meq via ORAL
  Filled 2016-09-01 (×2): qty 30

## 2016-09-01 NOTE — Progress Notes (Signed)
3Pharmacy Antibiotic Note  [name] is a 83 y.o. female admitted on 08/25/2016 with pneumonia.  Pharmacy has been consulted for Vancomycin and Cefepime dosing.  Plan per ID had been to stop today, but pt with increased secretions and will be stopped in AM instead.  Scopolamine patch also added for secretions.  Pt is afebrile  Plan: Continue Vancomycin 1g IV q24 Continue Cefepime 2g IV q24 F/U d/c antibiotics 2/14  Height: 5\' 7"  (170.2 cm) Weight: 142 lb (64.4 kg) IBW/kg (Calculated) : 61.6  Temp (24hrs), Avg:97.9 F (36.6 C), Min:97.5 F (36.4 C), Max:98.3 F (36.8 C)   Recent Labs Lab 08/26/16 0930 08/26/16 1208 08/27/16 0416 08/28/16 0527 08/29/16 0819 08/31/16 0603 09/01/16 0623  WBC 9.7  --   --   --  6.2 8.6 6.3  CREATININE  --  0.70 0.61 0.66  --  0.47 0.54    Estimated Creatinine Clearance: 42.7 mL/min (by C-G formula based on SCr of 0.54 mg/dL).    No Known Allergies  Antimicrobials this admission:  Tamiflu 2/8 >> 2/13 Azithro 2/7 >> 2/10 CTX 2/7 >> 2/9 Vanc 2/10 >> (2/13) Zosyn 2/10 Cefepime 2/10>> (2/13)  Microbiology results:  2/6 BCx: ngtd 2/6 UCx: insig growth  2/6 Influenza PCR: (+) influenza B  Thank you for allowing pharmacy to be a part of this patient's care.   Marisue HumbleKendra Brendalee Matthies, PharmD Clinical Pharmacist [facility]

## 2016-09-01 NOTE — Progress Notes (Signed)
Nutrition Follow-up  DOCUMENTATION CODES:   Severe malnutrition in context of chronic illness  INTERVENTION:   -Continue Ensure Enlive po BID, each supplement provides 350 kcal and 20 grams of protein -MVI daily  NUTRITION DIAGNOSIS:   Malnutrition related to chronic illness as evidenced by energy intake < or equal to 75% for > or equal to 1 month, severe depletion of body fat, severe depletion of muscle mass.  Ongoing  GOAL:   Patient will meet greater than or equal to 90% of their needs  Unmet  MONITOR:   PO intake, Supplement acceptance, Labs, Weight trends, Skin, I & O's  REASON FOR ASSESSMENT:   Low [place]    ASSESSMENT:   [name] is a 83 y.o. female  who presents with AMS from Flu w/ baseline dementia. Anticipate observation admission w/ quick recovery.   Pt sleeping soundly at time of visit. RD did not disturb. Family not available at time of visit.   Intake remains poor; PO: 0%. Staff report pt has consuming mainly Ensure and applesauce, which she accepts well. Meal completion 0%.  Per MD notes, pt has been making slow progress- completing course of IV antibiotics and tamiflu.   Pt son is not interested in SNF. Plan to d/c back home once medically stable.   Labs reviewed: K: 3.2 (on IV supplementation).   Diet Order:  DIET - DYS 1 Room service appropriate? Yes; Fluid consistency: Thin  Skin:  Wound (see comment) (st II mid upper back, sacrum)  Last BM:  08/29/16  Height:   Ht Readings from Last 1 Encounters:  08/25/16 5\' 7"  (1.702 m)    Weight:   Wt Readings from Last 1 Encounters:  08/29/16 142 lb (64.4 kg)    Ideal Body Weight:  61.5 kg  BMI:  Body mass index is 22.24 kg/m.  Estimated Nutritional Needs:   Kcal:  1450-1650  Protein:  65-80 grams  Fluid:  >1.4 L  EDUCATION NEEDS:   No education needs identified at this time  Delainy Mcelhiney A. Mayford KnifeWilliams, RD, LDN, CDE Pager: [phone] After hours Pager: [phone]

## 2016-09-01 NOTE — Progress Notes (Signed)
PROGRESS NOTE  [name]  DZH:[id number] DOB: 25-Mar-1921  DOA: 08/25/2016 PCP: Delia Chimes, NP   Brief Narrative:  83 year old female, PMH of advanced dementia, her son lives with her, moves around on a wheelchair with her son's assistance, presented to ED with 2 day history of nonproductive cough, low-grade fevers/101.5, dyspnea, increased weakness, inability to eat or drink at home and worsened confusions. Admitted for influenza B with respiratory manifestations, pneumonia and acute encephalopathy. Still quite ill but improving in some aspects. Have been updating son on a daily basis regarding critical nature of her illness. Due to persistent fevers, ID consulted on 08/29/16. Overall progress has been slow. Patient expected to complete IV antibiotics and Tamiflu on 2/13 then likely home with son (not interested in SNF).   Assessment & Plan:   Principal Problem:   Influenza due to influenza virus, type B Active Problems:   Alzheimer's dementia   Dementia   Hyperglycemia   DNR (do not resuscitate)   Encephalopathy acute   Pressure injury of skin   Protein-calorie malnutrition, severe   PNA (pneumonia)   Atherosclerotic peripheral vascular disease ([place])   History of recurrent UTIs   1. Influenza B with acute bronchitis and pneumonia: Influenza B PCR positive. Initial chest x-ray 08/25/16 negative for pneumonia. Complete 5 days or course of Tamiflu and supportive treatment. Had upper airway secretions >deep suctioning by RT, when necessary bronchodilator nebulizations, chest physical therapy. Blood cultures 2: Negative to date. Had not been able to take her Tamiflu due to being sleepy and unsafe to take by mouth. Has received oral doses of Tamiflu since 2/8. As per ID consultation 2/10, ongoing fevers may be late manifestation of her flu due to inconsistent intake of her Tamiflu. No high fevers since night of 2/9. Due to earlier missed doses, extended course of Tamiflu to complete  total 5 days.  2. Community-acquired pneumonia, RLL: Follow-up chest x-ray 08/26/16 showed right lower lobe opacity compatible with pneumonia. Started IV ceftriaxone and azithromycin per focussed pneumonia order set. Blood cultures 2: Negative to date. HIV antibodies: Nonreactive. Pro-calcitonin 12.45. Repeat chest x-ray 2/9: Increased bibasilar opacities, right greater than left concerning for effusions with associated atelectasis or infiltrate. Patient unable to cooperate for incentive spirometry. Effusions not large enough when patient not distressed or hypoxic for thoracentesis. Speech therapy recommends dysphagia 1 diet. Recurrent fevers 2/9 night. ID consulted. Changed antibiotics to vancomycin and cefepime. Fevers have defervesced in the last 48 hours. However still with increased secretions. Added scopolamine patch. Secretions better. We'll continue with IV antibiotics for today, can DC in a.m., still on 3 L oxygen nasal cannula, will wean as tolerated   3. Sepsis: Met sepsis criteria on admission with tachycardia, fever 100.4 and tachypnea. Secondary to influenza and pneumonia. Management as above. Improving. Seems to have defervesced. Last high fever 103.4 on 2/7 afternoon. Management as above.  4. Acute Metabolic encephalopathy complicating advanced Alzheimer's dementia: Secondary to acute illness/influenza/pneumonia complicating underlying advanced dementia. CT head without acute findings. As per son's report, she has days when she tends to sleep a lot. Treat acute illness as above. Slowly improving.   5. Hypokalemia/hypokalemia: Replete take  6. Elevated blood pressure: Stress response versus possible essential hypertension. When necessary IV hydralazine. Improved. Added amlodipine 2.5 MG daily.  7. Anemia: Follow CBCs periodically.  8.  Mild hyponatremia. Possibly volume depletion from decreased oral intake. Brief IV fluids. Follow BMP   DVT prophylaxis: Lovenox Code Status: DO NOT  RESUSCITATE Family Communication: Discussed with  son at bedside  Disposition Plan: DC home when medically stable, hopefully in a.m.  Consultants:   Infectious disease   Procedures:   None  Antimicrobials:   Tamiflu   IV azithromycin and ceftriaxone   Subjective: No significant events overnight, son at bedside, reports she just finished drinking her ensure, then start eating her breakfast yet  Objective:  Vitals:   08/31/16 2135 09/01/16 0540 09/01/16 0913 09/01/16 1122  BP: (!) 119/57 (!) 146/89    Pulse: 96 94    Resp: (!) 32 (!) 30    Temp: 98 F (36.7 C) 97.5 F (36.4 C)    TempSrc: Oral Oral    SpO2: 100% 100% 98% 95%  Weight:      Height:        Intake/Output Summary (Last 24 hours) at 09/01/16 1220 Last data filed at 09/01/16 0541  Gross per 24 hour  Intake          1344.17 ml  Output                0 ml  Net          1344.17 ml   Filed Weights   08/26/16 0613 08/28/16 0625 08/29/16 0641  Weight: 65.8 kg (145 lb 1.6 oz) 67.6 kg (149 lb) 64.4 kg (142 lb)    Examination:  General exam: Very elderly pleasant female, moderately built, frail, lying comfortably propped up in bed. Respiratory system: Some upper airway sounds but otherwise clear to auscultation. No increased work of breathing Cardiovascular system: S1 & S2 heard, RRR. No JVD, murmurs, rubs, gallops or clicks. No pedal edema. Gastrointestinal system: Abdomen is nondistended, soft and nontender. No organomegaly or masses felt. Normal bowel sounds heard. Central nervous system: Sleeping again this morning but when son tries to wake her up, mumbles and moans. No focal neurological deficits. Extremities: Moves all 4 limbs symmetrically, spontaneously. Skin: No rashes, lesions or ulcers Psychiatry: Judgement and insight impaired.    Data Reviewed: I have personally reviewed following labs and imaging studies  CBC:  Recent Labs Lab 08/26/16 0930 08/29/16 0819 08/31/16 0603 09/01/16 0623    WBC 9.7 6.2 8.6 6.3  HGB 13.4 11.5* 11.3* 10.1*  HCT 40.7 36.6 35.9* 31.9*  MCV 88.1 88.6 90.0 88.6  PLT 227 210 274 324   Basic Metabolic Panel:  Recent Labs Lab 08/26/16 1208 08/27/16 0416 08/28/16 0527 08/31/16 0603 09/01/16 0623  NA 140 141 144 147* 143  K 3.0* 3.3* 3.9 3.9 3.2*  CL 104 104 108 110 104  CO2 '22 25 26 27 29  '$ GLUCOSE 155* 113* 150* 104* 99  BUN 17 22* 26* 23* 23*  CREATININE 0.70 0.61 0.66 0.47 0.54  CALCIUM 8.1* 8.3* 9.0 9.1 8.7*  MG 1.8  --   --   --   --    GFR: Estimated Creatinine Clearance: 42.7 mL/min (by C-G formula based on SCr of 0.54 mg/dL). Liver Function Tests:  Recent Labs Lab 08/26/16 1208  AST 39  ALT 18  ALKPHOS 63  BILITOT 0.5  PROT 6.8  ALBUMIN 2.9*   No results for input(s): LIPASE, AMYLASE in the last 168 hours. No results for input(s): AMMONIA in the last 168 hours. Coagulation Profile: No results for input(s): INR, PROTIME in the last 168 hours. Cardiac Enzymes: No results for input(s): CKTOTAL, CKMB, CKMBINDEX, TROPONINI in the last 168 hours. BNP (last 3 results) No results for input(s): PROBNP in the last 8760 hours. HbA1C: No results for  input(s): HGBA1C in the last 72 hours. CBG: No results for input(s): GLUCAP in the last 168 hours. Lipid Profile: No results for input(s): CHOL, HDL, LDLCALC, TRIG, CHOLHDL, LDLDIRECT in the last 72 hours. Thyroid Function Tests: No results for input(s): TSH, T4TOTAL, FREET4, T3FREE, THYROIDAB in the last 72 hours. Anemia Panel: No results for input(s): VITAMINB12, FOLATE, FERRITIN, TIBC, IRON, RETICCTPCT in the last 72 hours.  Sepsis Labs:  Recent Labs Lab 08/26/16 0930 08/27/16 0416 08/29/16 0819 08/31/16 0603 09/01/16 0623  PROCALCITON  --  12.45 3.54  --   --   WBC 9.7  --  6.2 8.6 6.3    Recent Results (from the past 240 hour(s))  Urine culture     Status: Abnormal   Collection Time: 08/25/16  9:20 AM  Result Value Ref Range Status   Specimen Description  URINE, CATHETERIZED  Final   Special Requests NONE  Final   Culture <10,000 COLONIES/mL INSIGNIFICANT GROWTH (A)  Final   Report Status 08/26/2016 FINAL  Final  Culture, blood (Routine X 2) w Reflex to ID Panel     Status: None   Collection Time: 08/25/16 10:05 AM  Result Value Ref Range Status   Specimen Description BLOOD LEFT ANTECUBITAL  Final   Special Requests BOTTLES DRAWN AEROBIC AND ANAEROBIC 10CC  Final   Culture NO GROWTH 5 DAYS  Final   Report Status 08/30/2016 FINAL  Final  Culture, blood (Routine X 2) w Reflex to ID Panel     Status: None   Collection Time: 08/25/16 10:13 AM  Result Value Ref Range Status   Specimen Description BLOOD LEFT FOREARM  Final   Special Requests BOTTLES DRAWN AEROBIC ONLY [place]  Final   Culture NO GROWTH 5 DAYS  Final   Report Status 08/30/2016 FINAL  Final         Radiology Studies: No results found.      Scheduled Meds: . ceFEPime (MAXIPIME) IV  2 g Intravenous Q24H  . donepezil  10 mg Oral Q1500  . enoxaparin (LOVENOX) injection  40 mg Subcutaneous Q24H  . feeding supplement (ENSURE ENLIVE)  237 mL Oral BID BM  . ipratropium-albuterol  3 mL Nebulization BID  . mouth rinse  15 mL Mouth Rinse BID  . memantine  5 mg Oral Q1500  . scopolamine  1 patch Transdermal Q72H  . vancomycin  1,000 mg Intravenous Q24H   Continuous Infusions:    LOS: 6 days    Shailen Thielen, MD [facility] Pager [phone]  If 7PM-7AM, please contact night-coverage [email or web address] Password [email or web address] 09/01/2016, 12:20 PM

## 2016-09-02 MED ORDER — ENSURE ENLIVE PO LIQD: 237.0000 mL | Freq: Two times a day (BID) | ORAL | 12 refills | Status: AC

## 2016-09-02 MED ORDER — SCOPOLAMINE 1 MG/3DAYS TD PT72
1.0000 | MEDICATED_PATCH | TRANSDERMAL | 0 refills | Status: AC
Start: 2016-09-05 — End: ?

## 2016-09-02 MED ORDER — AMLODIPINE BESYLATE 5 MG PO TABS
5.0000 mg | ORAL_TABLET | Freq: Every day | ORAL | Status: DC
  Administered 2016-09-02: 5 mg via ORAL
  Filled 2016-09-02: qty 1

## 2016-09-02 MED ORDER — MORPHINE SULFATE (PF) 2 MG/ML IV SOLN
1.0000 mg | Freq: Once | INTRAVENOUS | Status: AC
  Administered 2016-09-02: 1 mg via INTRAVENOUS
  Filled 2016-09-02: qty 1

## 2016-09-02 MED ORDER — AMLODIPINE BESYLATE 5 MG PO TABS: 5.0000 mg | ORAL_TABLET | Freq: Every day | ORAL | 0 refills | Status: AC

## 2016-09-02 MED ORDER — HYDRALAZINE HCL 20 MG/ML IJ SOLN
5.0000 mg | INTRAMUSCULAR | Status: AC
  Administered 2016-09-02: 5 mg via INTRAVENOUS

## 2016-09-02 NOTE — Discharge Instructions (Signed)
Follow with Primary MD Tomi BambergerFULLER,SUSAN, NP in 7 days   Get CBC, CMP, 2 view Chest X ray checked  by Primary MD next visit.    Activity: As tolerated with Full fall precautions use walker/cane & assistance as needed   Disposition Home   Diet: Dysphagia 1 with thin liquid , with feeding assistance and aspiration precautions.  For Heart failure patients - Check your Weight same time everyday, if you gain over 2 pounds, or you develop in leg swelling, experience more shortness of breath or chest pain, call your Primary MD immediately. Follow Cardiac Low Salt Diet and 1.5 lit/day fluid restriction.   On your next visit with your primary care physician please Get Medicines reviewed and adjusted.   Please request your Prim.MD to go over all Hospital Tests and Procedure/Radiological results at the follow up, please get all Hospital records sent to your Prim MD by signing hospital release before you go home.   If you experience worsening of your admission symptoms, develop shortness of breath, life threatening emergency, suicidal or homicidal thoughts you must seek medical attention immediately by calling 911 or calling your MD immediately  if symptoms less severe.  You Must read complete instructions/literature along with all the possible adverse reactions/side effects for all the Medicines you take and that have been prescribed to you. Take any new Medicines after you have completely understood and accpet all the possible adverse reactions/side effects.   Do not drive, operating heavy machinery, perform activities at heights, swimming or participation in water activities or provide baby sitting services if your were admitted for syncope or siezures until you have seen by Primary MD or a Neurologist and advised to do so again.  Do not drive when taking Pain medications.    Do not take more than prescribed Pain, Sleep and Anxiety Medications  Special Instructions: If you have smoked or chewed  Tobacco  in the last 2 yrs please stop smoking, stop any regular Alcohol  and or any Recreational drug use.  Wear Seat belts while driving.   Please note  You were cared for by a hospitalist during your hospital stay. If you have any questions about your discharge medications or the care you received while you were in the hospital after you are discharged, you can call the unit and asked to speak with the hospitalist on call if the hospitalist that took care of you is not available. Once you are discharged, your primary care physician will handle any further medical issues. Please note that NO REFILLS for any discharge medications will be authorized once you are discharged, as it is imperative that you return to your primary care physician (or establish a relationship with a primary care physician if you do not have one) for your aftercare needs so that they can reassess your need for medications and monitor your lab values.

## 2016-09-02 NOTE — Progress Notes (Addendum)
Speech Language Pathology Treatment: Dysphagia  Patient Details Name: [name] MRN: [id number] DOB: [birth year] Today's Date: 09/02/2016 Time: 1120-1140 SLP Time Calculation (min) (ACUTE ONLY): 20 min  Assessment / Plan / Recommendation Clinical Impression  Pt seen at bedside for assessment of diet tolerance and education. No family present at this time. RN reports decreased po intake this morning. Congested breath sounds and intermittent congested nonproductive cough noted prior to po trials. RN and SLP repositioned pt to upright and pt was given small boluses of thin liquid. Suspect delayed swallow reflex. Cough noted after po trials, however, cough exhibited prior to po intake as well. Pt is at increased aspiration risk due to cognitive impairment and advanced age. [facility], with encouragement for po only when ALERT, upright position during and 30 minutes after meals, oral care before and after po intake, and high aspiration risk. ST will continue to follow for education when family available, regarding continuing po intake with known risks of aspiration. It has been documented by several different team members that pt's son is aware of pt's declining status and risk of aspiration.  Palliative Care consult continues to be recommended to facilitate establishment of goals of care.   HPI HPI: [name] a 83 y.o.femalewith medical history significant for Alzheimer's dementia and arthritis brought to the emergency department with 2 day history of mild cough, non-productive, along with low-grade fevers. CT the head negative for acute findings. Influenza B positive. Chest x-ray consistent with COPD, and a tiny bilateral pleural effusions noted. CXR 2/9 increased bibasilar opacities are noted, right greater than left, concerning for effusions with associated atelectasis or infiltrate. Repeat CXR 2/10 small bilateral pleural effusions. BSE and follow up  11/2015 recommended reg/thin once mental status improves. Therapist discussed at length with son the eventual impact of dementia upon swallowing.  He understands that dysphagia with aspiration may eventually become problematic. ST following for assessment of diet tolerance and education.   SLP Plan  Continue with current plan of care     Recommendations  Diet recommendations: Dysphagia 1 (puree);Thin liquid Liquids provided via: Cup;Straw Medication Administration: Crushed with puree Supervision: Full supervision/cueing for compensatory strategies Compensations: Slow rate;Small sips/bites;Minimize environmental distractions Postural Changes and/or Swallow Maneuvers: Seated upright 90 degrees;Upright 30-60 min after meal      Oral Care Recommendations: Oral care before and after PO Follow up Recommendations: None Plan: Continue with current plan of care       [name], [facility] LLCMSP, CCC-SLP [phone]  [name], [name] 09/02/2016, 11:43 AM

## 2016-09-02 NOTE — Discharge Summary (Signed)
[name], is a 83 y.o. female  DOB April 26, 1921  MRN [id number].  Admission date:  08/25/2016  Admitting Physician  Waldemar Dickens, MD  Discharge Date:  09/02/2016   Primary MD  Delia Chimes, NP  Recommendations for primary care physician for things to follow:  - Please check CBC, BMP during next visit, repeat 2 view chest x-ray in 2 weeks.   Admission Diagnosis  Encephalopathy acute [G93.40] Influenza [J11.1]   Discharge Diagnosis  Encephalopathy acute [G93.40] Influenza [J11.1]    Principal Problem:   Influenza due to influenza virus, type B Active Problems:   Alzheimer's dementia   Dementia   Hyperglycemia   DNR (do not resuscitate)   Encephalopathy acute   Pressure injury of skin   Protein-calorie malnutrition, severe   PNA (pneumonia)   Atherosclerotic peripheral vascular disease ([place])   History of recurrent UTIs      Past Medical History:  Diagnosis Date  . Arthritis   . Dementia     Past Surgical History:  Procedure Laterality Date  . ABDOMINAL HYSTERECTOMY         History of present illness and  Hospital Course:     Kindly see H&P for history of present illness and admission details, please review complete Labs, Consult reports and Test reports for all details in brief  HPI  from the history and physical done on the day of admission 08/25/2016  HPI: [name] is a 83 y.o. female with medical history significant for Alzheimer's dementia, brought to the emergency department with 2 day history of mild cough, non-productive, along with low-grade fevers. [name] reported that her cough was worsened today, and she was noted to be short of breath, increased weakness, unable to eat or drink at home. She was aversive to taking liquids, and was appearing distraught, increasingly confused. At home, she had fever up to 101.5. Son did not notice any  dysuria, hematuria,  or frequency. No chest pain, or back pain. No rashes. No neck stiffness or headaches    ED Course:  BP 163/75 (BP Location: Right Arm)   Pulse 104   Temp 99.7 F (37.6 C) (Oral)   Resp (!) 37   SpO2 97%    potassium 3.4 glucose 133 lactic acid 1.9  WBC 8.6, Hb 12.2 and Plt 272 CT  the head negative for acute findings Influenza B positive EDP wanted to initiate Tamiflu while in the hospital, but as the patient is unable to follow commands at this time, this medicine could not be given. Chest  x-ray consistent with COPD, and a tiny bilateral pleural effusions noted. No pneumonia seen blood cultures and urine cultures are pending she received 1 L of 5E fluid, and she is now receiving rate of 100 citizen hour of IV normal saline.  Hospital Course  83 year old female, PMH of advanced dementia, her son lives with her, moves around on a wheelchair with her son's assistance, presented to ED with 2 day history of nonproductive cough, low-grade fevers/101.5, dyspnea, increased  weakness, inability to eat or drink at home and worsened confusions. Admitted for influenza B with respiratory manifestations, pneumonia and acute encephalopathy. Still quite ill but improving in some aspects. Have been updating son on a daily basis regarding critical nature of her illness. Due to persistent fevers, ID consulted on 08/29/16. Overall progress has been slow. Patient expected to complete IV antibiotics and Tamiflu on 2/13 then likely home with son (not interested in SNF).   1. Influenza B with acute bronchitis and pneumonia: Influenza B PCR positive. Initial chest x-ray 08/25/16 negative for pneumonia. Complete 5 days or course of Tamiflu and supportive treatment. Had upper airway secretions >deep suctioning by RT, when necessary bronchodilator nebulizations, chest physical therapy. Blood cultures 2: Negative to date. Had not been able to take her Tamiflu due to being sleepy and unsafe to take by mouth. Has received  oral doses of Tamiflu since 2/8. As per ID consultation 2/10, ongoing fevers may be late manifestation of her flu due to inconsistent intake of her Tamiflu. No high fevers since night of 2/9. Due to earlier missed doses, extended course of Tamiflu to complete total 5 days.  2. Community-acquired pneumonia, RLL: Follow-up chest x-ray 08/26/16 showed right lower lobe opacity compatible with pneumonia. Started IV ceftriaxone and azithromycin per focussed pneumonia order set. Blood cultures 2: Negative to date. HIV antibodies: Nonreactive. Pro-calcitonin 12.45. Repeat chest x-ray 2/9: Increased bibasilar opacities, right greater than left concerning for effusions with associated atelectasis or infiltrate. Patient unable to cooperate for incentive spirometry. Effusions not large enough when patient not distressed or hypoxic for thoracentesis. Speech therapy recommends dysphagia 1 diet. Recurrent fevers 2/9 night. ID consulted. Changed antibiotics to vancomycin and cefepime. Fevers have defervesced in the last 48 hours. However still with increased secretions. Added scopolamine patch. Secretions better.  IV vancomycin and cefepime stopped 2/13.  3. Sepsis: Met sepsis criteria on admission with tachycardia, fever 100.4 and tachypnea. Secondary to influenza and pneumonia. Management as above. Improving. Seems to have defervesced. Last high fever 103.4 on 2/7 afternoon. Management as above.  4. Acute Metabolic encephalopathy complicating advanced Alzheimer's dementia: Secondary to acute illness/influenza/pneumonia complicating underlying advanced dementia. CT head without acute findings. As per son's report, she has days when she tends to sleep a lot. Treat acute illness as above. Slowly improving.   5. Hypokalemia/hypokalemia: Replete take  6. Elevated blood pressure: Stress response versus possible essential hypertension. Started on amlodipine 5 mg oral daily  7. Anemia: Follow CBCs periodically.             Discharge Condition:  Stable - But intermediate to long-term overall prognosis is guarded in this  frail elderly patient   Follow UP  Follow-up Information    FULLER,SUSAN, NP Follow up in 1 week(s).   Specialty:  Nurse Practitioner Contact information: [place] [address] [phone]             Discharge Instructions  and  Discharge Medications    Discharge Instructions    Discharge instructions    Complete by:  As directed    Follow with Primary MD Delia Chimes, NP in 7 days   Get CBC, CMP, 2 view Chest X ray checked  by Primary MD next visit.    Activity: As tolerated with Full fall precautions use walker/cane & assistance as needed   Disposition Home   Diet: Dysphagia 1 with thin liquid , with feeding assistance and aspiration precautions.  For Heart failure patients - Check your Weight  same time everyday, if you gain over 2 pounds, or you develop in leg swelling, experience more shortness of breath or chest pain, call your Primary MD immediately. Follow Cardiac Low Salt Diet and 1.5 lit/day fluid restriction.   On your next visit with your primary care physician please Get Medicines reviewed and adjusted.   Please request your Prim.MD to go over all Hospital Tests and Procedure/Radiological results at the follow up, please get all Hospital records sent to your Prim MD by signing hospital release before you go home.   If you experience worsening of your admission symptoms, develop shortness of breath, life threatening emergency, suicidal or homicidal thoughts you must seek medical attention immediately by calling 911 or calling your MD immediately  if symptoms less severe.  You Must read complete instructions/literature along with all the possible adverse reactions/side effects for all the Medicines you take and that have been prescribed to you. Take any new Medicines after you have completely understood and accpet all the  possible adverse reactions/side effects.   Do not drive, operating heavy machinery, perform activities at heights, swimming or participation in water activities or provide baby sitting services if your were admitted for syncope or siezures until you have seen by Primary MD or a Neurologist and advised to do so again.  Do not drive when taking Pain medications.    Do not take more than prescribed Pain, Sleep and Anxiety Medications  Special Instructions: If you have smoked or chewed Tobacco  in the last 2 yrs please stop smoking, stop any regular Alcohol  and or any Recreational drug use.  Wear Seat belts while driving.   Please note  You were cared for by a hospitalist during your hospital stay. If you have any questions about your discharge medications or the care you received while you were in the hospital after you are discharged, you can call the unit and asked to speak with the hospitalist on call if the hospitalist that took care of you is not available. Once you are discharged, your primary care physician will handle any further medical issues. Please note that NO REFILLS for any discharge medications will be authorized once you are discharged, as it is imperative that you return to your primary care physician (or establish a relationship with a primary care physician if you do not have one) for your aftercare needs so that they can reassess your need for medications and monitor your lab values.     Allergies as of 09/02/2016   No Known Allergies     Medication List    TAKE these medications   amLODipine 5 MG tablet Commonly known as:  NORVASC Take 1 tablet (5 mg total) by mouth daily. Start taking on:  09/03/2016   cholecalciferol 1000 units tablet Commonly known as:  VITAMIN D Take 1,000 Units by mouth daily at 3 pm.   docusate sodium 100 MG capsule Commonly known as:  COLACE Take 100 mg by mouth daily at 3 pm.   donepezil 10 MG tablet Commonly known as:  ARICEPT Take  10 mg by mouth daily at 3 pm.   feeding supplement (ENSURE ENLIVE) Liqd Take 237 mLs by mouth 2 (two) times daily between meals.   memantine 5 MG tablet Commonly known as:  NAMENDA Take 5 mg by mouth daily at 3 pm.   scopolamine 1 MG/3DAYS Commonly known as:  TRANSDERM-SCOP Place 1 patch (1.5 mg total) onto the skin every 3 (three) days. Start taking on:  09/05/2016         Diet and Activity recommendation: See Discharge Instructions above   Consults obtained -  ID   Major procedures and Radiology Reports - PLEASE review detailed and final reports for all details, in brief -      Dg Chest 2 View  Result Date: 08/25/2016 CLINICAL DATA:  Nonproductive cough, fever, and tachypneia since yesterday. Nonsmoker. History of encephalopathy. EXAM: CHEST  2 VIEW COMPARISON:  None. FINDINGS: The lungs are mildly hyperinflated. There is no focal infiltrate. There is blunting of the costophrenic angles compatible with tiny pleural effusions. The heart is normal in size. The pulmonary vascularity is not engorged. There is the mediastinum is normal in width. There is calcification in the wall of the thoracic aorta. There is multilevel degenerative disc disease of the thoracic spine. IMPRESSION: COPD. Tiny bilateral pleural effusions. No pulmonary edema or pneumonia. Thoracic aortic atherosclerosis. Electronically Signed   By: David  Martinique M.D.   On: 08/25/2016 07:58   Ct Head Wo Contrast  Result Date: 08/25/2016 CLINICAL DATA:  Altered mental status with increasing lethargy. Fever. EXAM: CT HEAD WITHOUT CONTRAST TECHNIQUE: Contiguous axial images were obtained from the base of the skull through the vertex without intravenous contrast. COMPARISON:  Head CT May 23, 2015 and brain MRI May 24, 2015 FINDINGS: Brain: There is moderate diffuse atrophy, stable. There is no intracranial mass, hemorrhage, extra-axial fluid collection, or midline shift. There is patchy small vessel disease throughout  the centra semiovale bilaterally. There is evidence of prior infarct in the medial left occipital lobe, stable. No acute infarct is demonstrable. Vascular: There is no evident hyperdense vessel. There is calcification in the carotid siphon regions bilaterally. There is calcification in the proximal basilar artery as well as in the distal vertebral arteries. Skull: The bony calvarium appears intact. Sinuses/Orbits: There is opacification in several ethmoid air cells bilaterally. Other paranasal sinuses are clear. Orbits appear symmetric bilaterally. Other: Mastoid air cells are clear on the right. There is opacification in several mastoid air cells on the left inferiorly, stable. IMPRESSION: Atrophy with patchy periventricular small vessel disease, stable. Prior infarct medial left occipital lobe. No acute infarct evident. No intracranial mass hemorrhage, or extra-axial fluid collection. There are foci of arterial vascular calcification. There is ethmoid sinus disease bilaterally. There is mild chronic mastoid air cell disease on the left inferiorly. Electronically Signed   By: Lowella Grip III M.D.   On: 08/25/2016 11:43   Dg Chest Port 1 View  Result Date: 08/29/2016 CLINICAL DATA:  Pneumonia EXAM: PORTABLE CHEST 1 VIEW COMPARISON:  Two hundred eighteen FINDINGS: There is no focal parenchymal opacity. There are small bilateral pleural effusions. The heart mediastinum are stable. There is thoracic aortic atherosclerosis. The osseous structures are unremarkable. IMPRESSION: Small bilateral pleural effusions. Electronically Signed   By: Kathreen Devoid   On: 08/29/2016 10:44   Dg Chest Port 1 View  Result Date: 08/28/2016 CLINICAL DATA:  Pneumonia. EXAM: PORTABLE CHEST 1 VIEW COMPARISON:  Radiograph August 26, 2016. FINDINGS: Stable cardiomediastinal silhouette. Atherosclerosis of thoracic aorta is noted. No pneumothorax is noted. Mild central pulmonary vascular congestion is noted. Increased right basilar  opacity is noted concerning for pleural effusion with associated atelectasis or infiltrate. Mildly increased left basilar opacity is also noted concerning for effusion with atelectasis or infiltrate. Bony thorax is unremarkable. IMPRESSION: Increased bibasilar opacities are noted, right greater than left, concerning for effusions with associated atelectasis or infiltrate. Aortic atherosclerosis. Electronically Signed   By:  Marijo Conception, M.D.   On: 08/28/2016 08:37   Dg Chest Port 1 View  Result Date: 08/26/2016 CLINICAL DATA:  Wheezing, flu EXAM: PORTABLE CHEST 1 VIEW COMPARISON:  08/25/2016 FINDINGS: There is hyperinflation of the lungs compatible with COPD. Heart is normal size. Consolidation noted in the right lower lobe compatible with pneumonia. No visible effusions. Peribronchial thickening. No acute bony abnormality. IMPRESSION: COPD, bronchitic changes. Focal right lower lobe opacity compatible with pneumonia. Followup PA and lateral chest X-ray is recommended in 3-4 weeks following trial of antibiotic therapy to ensure resolution and exclude underlying malignancy. Electronically Signed   By: Rolm Baptise M.D.   On: 08/26/2016 12:17    Micro Results    Recent Results (from the past 240 hour(s))  Urine culture     Status: Abnormal   Collection Time: 08/25/16  9:20 AM  Result Value Ref Range Status   Specimen Description URINE, CATHETERIZED  Final   Special Requests NONE  Final   Culture <10,000 COLONIES/mL INSIGNIFICANT GROWTH (A)  Final   Report Status 08/26/2016 FINAL  Final  Culture, blood (Routine X 2) w Reflex to ID Panel     Status: None   Collection Time: 08/25/16 10:05 AM  Result Value Ref Range Status   Specimen Description BLOOD LEFT ANTECUBITAL  Final   Special Requests BOTTLES DRAWN AEROBIC AND ANAEROBIC 10CC  Final   Culture NO GROWTH 5 DAYS  Final   Report Status 08/30/2016 FINAL  Final  Culture, blood (Routine X 2) w Reflex to ID Panel     Status: None   Collection  Time: 08/25/16 10:13 AM  Result Value Ref Range Status   Specimen Description BLOOD LEFT FOREARM  Final   Special Requests BOTTLES DRAWN AEROBIC ONLY [facility]  Final   Culture NO GROWTH 5 DAYS  Final   Report Status 08/30/2016 FINAL  Final       Today   Subjective:   [name] todayIs nonverbal, cannot provide any complaints, she had an episode of agitation overnight with control blood pressure.  Objective:   Blood pressure (!) 125/56, pulse 95, temperature 98.5 F (36.9 C), temperature source Rectal, resp. rate (!) 28, height '5\' 7"'$  (1.702 m), weight 64.4 kg (142 lb), SpO2 93 %.   Intake/Output Summary (Last 24 hours) at 09/02/16 1325 Last data filed at 09/01/16 1508  Gross per 24 hour  Intake              360 ml  Output                0 ml  Net              360 ml    Exam General exam: Very elderly pleasant female, moderately built, frail, lying comfortably propped up in bed. Respiratory system: Some upper airway sounds but otherwise clear to auscultation. No increased work of breathing Cardiovascular system: S1 & S2 heard, RRR. No JVD, murmurs, rubs, gallops or clicks. No pedal edema. Gastrointestinal system: Abdomen is nondistended, soft and nontender. No organomegaly or masses felt. Normal bowel sounds heard. Central nervous system: Sleeping again this morning but when son tries to wake her up, mumbles and moans. No focal neurological deficits. Extremities: Moves all 4 limbs symmetrically, spontaneously. Skin: No rashes, lesions or ulcers Psychiatry: Judgement and insight impaired.  Data Review   CBC w Diff: Lab Results  Component Value Date   WBC 6.3 09/01/2016   HGB 10.1 (L) 09/01/2016   HCT  31.9 (L) 09/01/2016   PLT 290 09/01/2016   LYMPHOPCT 9 08/25/2016   MONOPCT 9 08/25/2016   EOSPCT 0 08/25/2016   BASOPCT 0 08/25/2016    CMP: Lab Results  Component Value Date   NA 143 09/01/2016   K 3.2 (L) 09/01/2016   CL 104 09/01/2016   CO2 29 09/01/2016     BUN 23 (H) 09/01/2016   CREATININE 0.54 09/01/2016   PROT 6.8 08/26/2016   ALBUMIN 2.9 (L) 08/26/2016   BILITOT 0.5 08/26/2016   ALKPHOS 63 08/26/2016   AST 39 08/26/2016   ALT 18 08/26/2016  .   Total Time in preparing paper work, data evaluation and todays exam - 35 minutes  Mkayla Steele M.D on 09/02/2016 at 1:25 PM  [facility]   Office  [phone]

## 2016-09-02 NOTE — Progress Notes (Signed)
NURSING PROGRESS NOTE  [name] [id number] Discharge Data: 09/02/2016 3:35 PM Attending Provider: No att. providers found WGN:[id number],[id number]:FULLER,SUSAN, NP     [name] to be D/C'd Home per MD order.  Discussed with the patient the After Visit Summary and all questions fully answered. All IV's discontinued with no bleeding noted. All belongings returned to patient for patient to take home.   Last Vital Signs:  Blood pressure (!) 125/56, pulse 95, temperature 98.5 F (36.9 C), temperature source Rectal, resp. rate 20, height 5\' 7"  (1.702 m), weight 64.4 kg (142 lb), SpO2 93 %.  Discharge Medication List Allergies as of 09/02/2016   No Known Allergies     Medication List    TAKE these medications   amLODipine 5 MG tablet Commonly known as:  NORVASC Take 1 tablet (5 mg total) by mouth daily. Start taking on:  09/03/2016   cholecalciferol 1000 units tablet Commonly known as:  VITAMIN D Take 1,000 Units by mouth daily at 3 pm.   docusate sodium 100 MG capsule Commonly known as:  COLACE Take 100 mg by mouth daily at 3 pm.   donepezil 10 MG tablet Commonly known as:  ARICEPT Take 10 mg by mouth daily at 3 pm.   feeding supplement (ENSURE ENLIVE) Liqd Take 237 mLs by mouth 2 (two) times daily between meals.   memantine 5 MG tablet Commonly known as:  NAMENDA Take 5 mg by mouth daily at 3 pm.   scopolamine 1 MG/3DAYS Commonly known as:  TRANSDERM-SCOP Place 1 patch (1.5 mg total) onto the skin every 3 (three) days. Start taking on:  09/05/2016

## 2016-11-28 IMAGING — MR MR HEAD W/O CM
9 of 11 series · 32 of 48 positions shown · non-contrast
Comparison: Head CT without contrast 05/23/2015. Brain MRI
08/31/2014.

ADDENDUM:
Study discussed by telephone with Dr. RUDI JUMPER on 05/24/2015 at
8767 hours.
CLINICAL DATA: [AGE] female with syncope or seizure. Episode
of unresponsiveness followed by vomiting. Initial encounter.

EXAM:
MRI HEAD WITHOUT CONTRAST
MRA HEAD WITHOUT CONTRAST
TECHNIQUE: Multiplanar, multiecho pulse sequences of the brain and surrounding
structures were obtained without intravenous contrast. Angiographic
images of the head were obtained using MRA technique without
contrast.

[Series 3: T1 · sagittal · 5.0mm · 0.47mm/px · 2 of 23 slices shown]
[im 1/23]
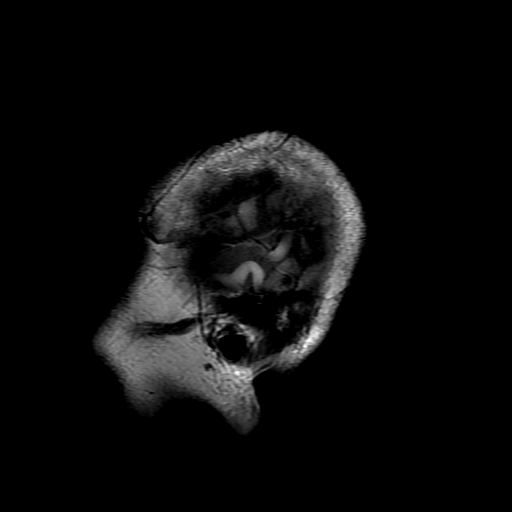
[im 23/23]
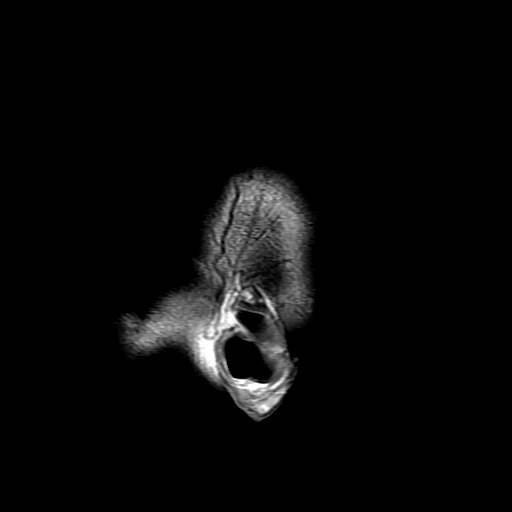

[Series 4: DWI · axial · 3.0mm · 1.09mm/px · z∈[-98,+34]mm · 7 of 90 slices shown (1 of 4)]
[im 1/90]
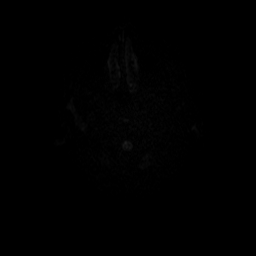
[im 15/90]
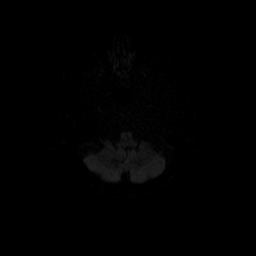
[im 30/90]
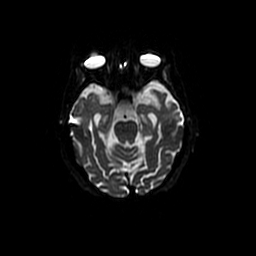
[im 45/90]
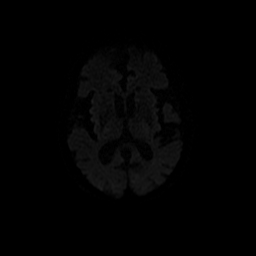
[im 60/90]
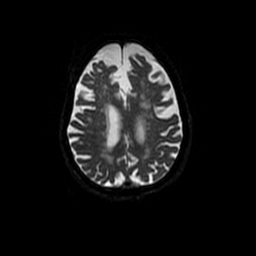
[im 75/90]
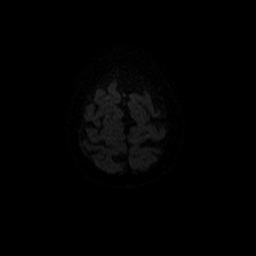
[im 90/90]
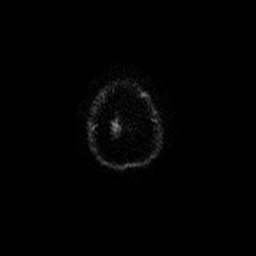

[Series 5: (id) mt fs · axial · 1.4mm · 0.43mm/px · z∈[-102,-64]mm · 4 of 136 slices shown]
[im 1/136]
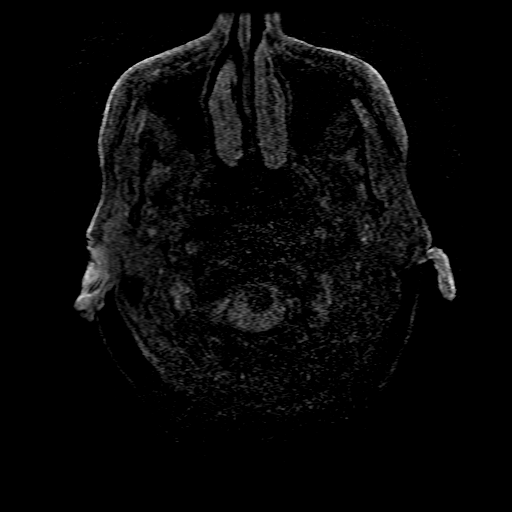
[im 28/136]
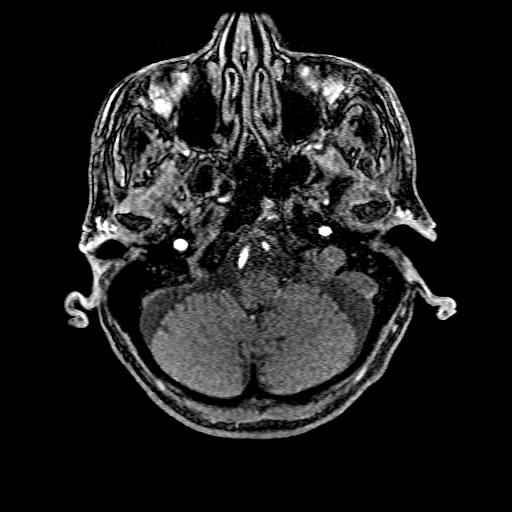
[im 41/136]
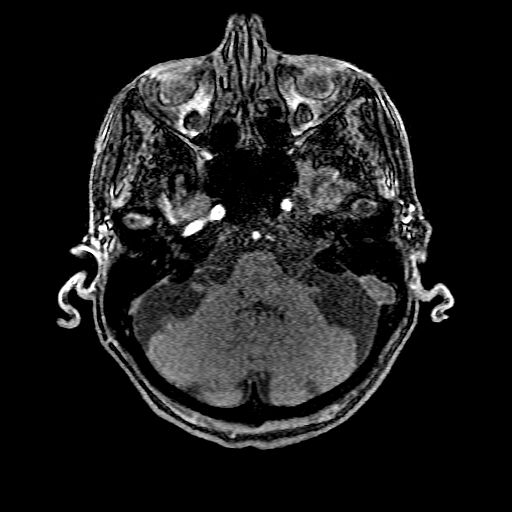
[im 55/136]
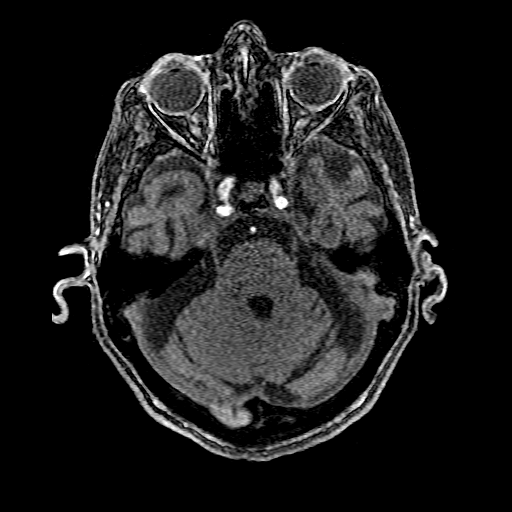

[Series 6: T2 · axial · 5.0mm · 0.43mm/px · z∈[-101,+31]mm · 2 of 23 slices shown (1 of 2)]
[im 1/23]
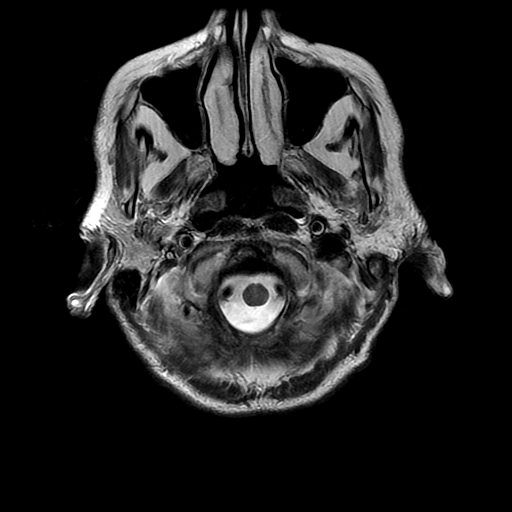
[im 23/23]
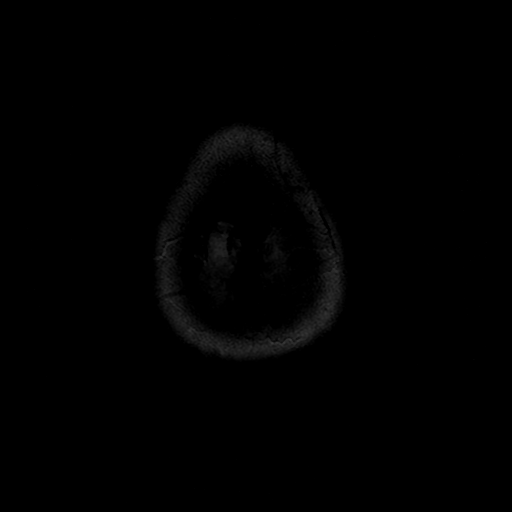

[Series 7: FLAIR · axial · 5.0mm · 0.43mm/px · z∈[-101,+31]mm · 2 of 23 slices shown]
[im 1/23]
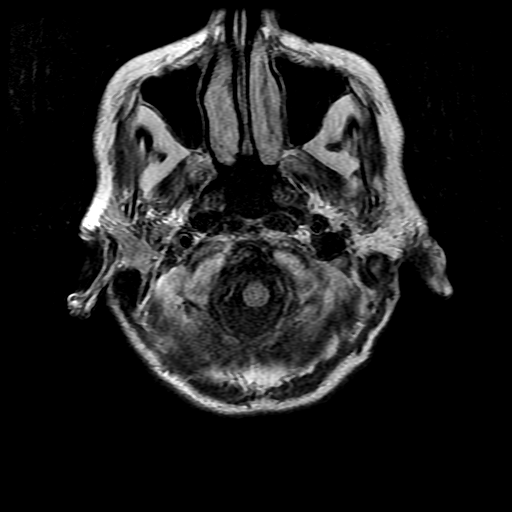
[im 23/23]
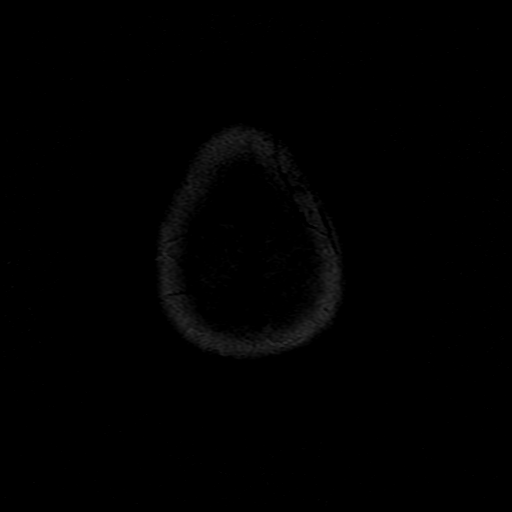

[Series 8: DWI · coronal · 5.0mm · 1.09mm/px · 6 of 72 slices shown (2 of 4)]
[im 1/72]
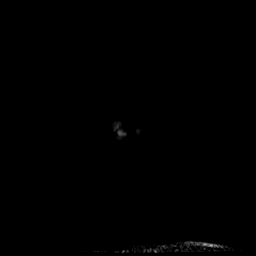
[im 15/72]
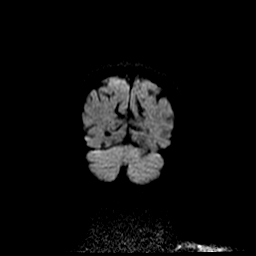
[im 29/72]
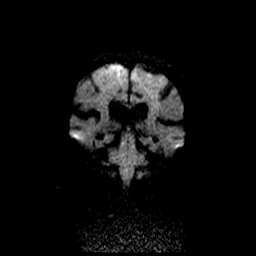
[im 43/72]
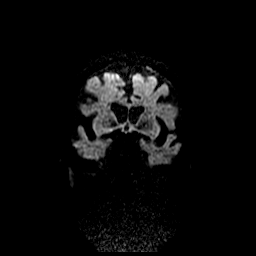
[im 57/72]
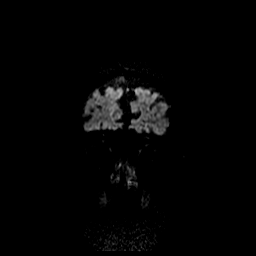
[im 72/72]
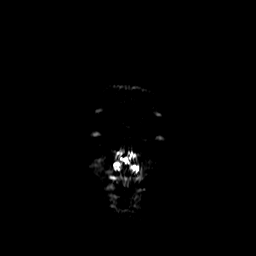

[Series 11: T2 · coronal · 5.0mm · 0.39mm/px · 2 of 28 slices shown (2 of 2)]
[im 1/28]
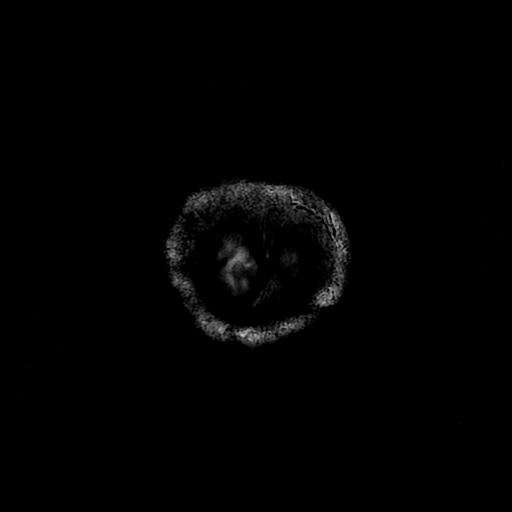
[im 28/28]
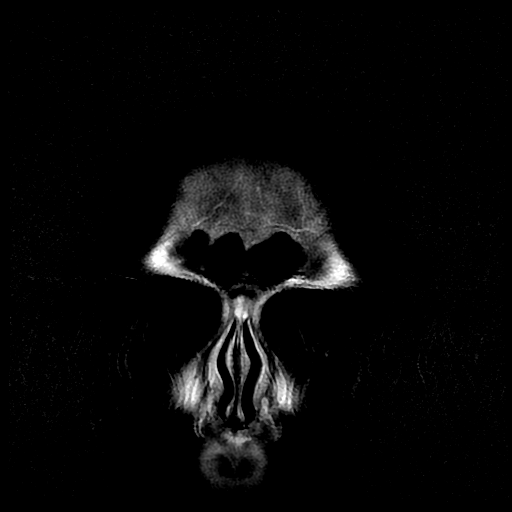

[Series 400: DWI · axial · 3.0mm · 1.09mm/px · z∈[-98,+34]mm · 4 of 45 slices shown (3 of 4)]
[im 1/45]
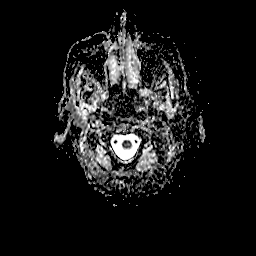
[im 15/45]
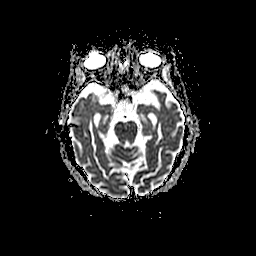
[im 30/45]
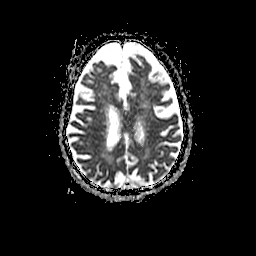
[im 45/45]
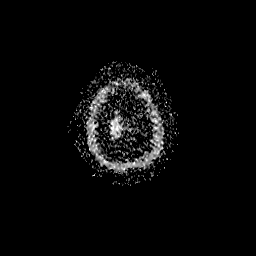

[Series 800: DWI · coronal · 5.0mm · 1.09mm/px · 3 of 36 slices shown (4 of 4)]
[im 1/36]
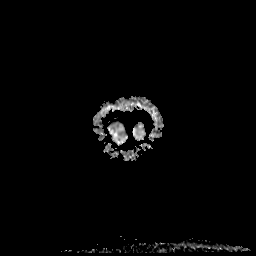
[im 18/36]
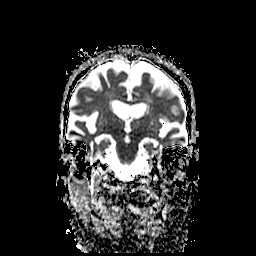
[im 36/36]
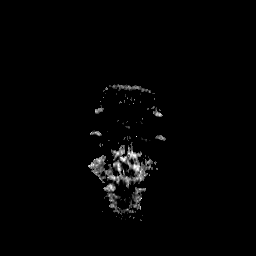

[32 of 48 positions shown; findings below may reference images not displayed]

FINDINGS: MRI HEAD FINDINGS

Major intracranial vascular flow voids are stable. Stable cerebral
volume. No restricted diffusion to suggest acute infarction. No
midline shift, mass effect, evidence of mass lesion,
ventriculomegaly, extra-axial collection or acute intracranial
hemorrhage. Cervicomedullary junction and pituitary are within
normal limits.

Stable gray and white matter signal. Confluent, patchy bilateral
cerebral white matter T2 and FLAIR hyperintensity. Chronic
encephalomalacia in both occipital poles greater on the left. No
chronic cerebral blood products. Chronic lacunar infarcts in both
thalami. Brainstem and cerebellum are within normal limits for age.

Visible internal auditory structures appear normal. Trace mastoid
fluid on the left is stable. Decreased retained secretions in the
nasopharynx. Stable mild paranasal sinus mucosal thickening. Orbit
and scalp soft tissues are stable.

MRA HEAD FINDINGS

Antegrade flow in the posterior circulation. Fairly codominant
distal vertebral arteries. PICA origins appear patent. Patent
basilar artery with mild distal basilar stenosis. SCA and left PCA
origins are within normal limits. The right PCA is occluded just
beyond its origin. Posterior communicating arteries are diminutive
or absent. Moderate to severe tandem stenoses in the left PCA P2 and
P3 segments.

Antegrade flow in both ICA siphons no siphon stenosis. Carotid
termini are patent. Ophthalmic artery origins within normal limits.
MCA and ACA origins within normal limits.

Dominant right ACA A1 segment. Mild left A1 stenosis. Anterior
communicating artery is within normal limits. Moderate tandem
stenoses in the left ACA A2 and distal branches with preserved
distal flow. MCA M1 segments and bifurcations are within normal
limits. No MCA branch occlusion identified.
IMPRESSION: 1. No acute intracranial abnormality. Chronic ischemic disease
appears stable since [DATE]. Intracranial MRA reveals right PCA occlusion (chronic in light of
no acute changes on MRI), as well as moderate or severe stenoses in
the left PCA and ACA.

## 2016-12-09 ENCOUNTER — Encounter: Payer: Medicare Other | Attending: Internal Medicine | Admitting: Internal Medicine

## 2016-12-09 DIAGNOSIS — L89523 Pressure ulcer of left ankle, stage 3: Secondary | ICD-10-CM | POA: Insufficient documentation

## 2016-12-09 DIAGNOSIS — L97223 Non-pressure chronic ulcer of left calf with necrosis of muscle: Secondary | ICD-10-CM | POA: Diagnosis not present

## 2016-12-09 DIAGNOSIS — L89613 Pressure ulcer of right heel, stage 3: Secondary | ICD-10-CM | POA: Insufficient documentation

## 2016-12-09 DIAGNOSIS — L89623 Pressure ulcer of left heel, stage 3: Secondary | ICD-10-CM | POA: Diagnosis present

## 2016-12-09 DIAGNOSIS — G301 Alzheimer's disease with late onset: Secondary | ICD-10-CM | POA: Diagnosis not present

## 2016-12-09 DIAGNOSIS — I10 Essential (primary) hypertension: Secondary | ICD-10-CM | POA: Diagnosis not present

## 2016-12-09 DIAGNOSIS — D649 Anemia, unspecified: Secondary | ICD-10-CM | POA: Insufficient documentation

## 2016-12-09 DIAGNOSIS — M1990 Unspecified osteoarthritis, unspecified site: Secondary | ICD-10-CM | POA: Diagnosis not present

## 2016-12-11 NOTE — Progress Notes (Signed)
KEASIA, DUBOSE ([id number]) Visit Report for 12/09/2016 Chief Complaint Document Details DENA, ESPERANZA Date of Service: 12/09/2016 10:30 AM Patient Name: [name] Account Number: [id number] Medical Record Treating RN: [name] [id number] Number: Other Clinician: 09-09-1920 (83 y.o. Treating Florella Mcneese Date of Birth/Sex: Female) Provider/Extender: G Primary Care Provider: [name] Referring Provider: [name] Weeks in Treatment: 0 Information Obtained from: Patient Chief Complaint Follow-up regarding right lateral calf wounds and right heel pressure injury Electronic Signature(s) Signed: 12/09/2016 5:34:29 PM By: Baltazar Najjar MD Entered By: Baltazar Najjar on 12/09/2016 12:07:14 Benefiel, [name] -------------------------------------------------------------------------------- Debridement Details [name] Date of Service: 12/09/2016 10:30 AM Patient Name: [name] Account Number: [id number] Medical Record Treating RN: [name] [id number] Number: Other Clinician: 1921/05/28 (83 y.o. Treating Mani Celestin Date of Birth/Sex: Female) Provider/Extender: G Primary Care Provider: [name] Referring Provider: [name] in Treatment: 0 Debridement Performed for Wound #5 Right Calcaneus Assessment: Performed By: Physician [name], MD Debridement: Debridement Pre-procedure Verification/Time Out Yes - 11:27 Taken: Start Time: 11:30 Pain Control: Lidocaine 4% Topical Solution Level: Skin/Subcutaneous Tissue Total Area Debrided (L x 6 (cm) x 10 (cm) = 60 (cm) W): Tissue and other Viable, Non-Viable, Exudate, Fibrin/Slough, Subcutaneous material debrided: Instrument: Blade, Forceps Bleeding: Minimum Hemostasis Achieved: Pressure End Time: 11:32 Procedural Pain: 0 Post Procedural Pain: 0 Response to Treatment: Procedure was tolerated well Post Debridement Measurements of  Total Wound Length: (cm) 6 Stage: Category/Stage III Width: (cm) 10 Depth: (cm) 0.1 Volume: (cm) 4.712 Character of Wound/Ulcer Post Requires Further Debridement: Debridement Post Procedure Diagnosis Same as Pre-procedure Electronic Signature(s) Signed: 12/09/2016 5:34:29 PM By: Baltazar Najjar MD Signed: 12/09/2016 5:43:51 PM By: Georgina Snell ([id number]) Entered By: Baltazar Najjar on 12/09/2016 12:05:09 Manfredi, [name] ([id number]) -------------------------------------------------------------------------------- Debridement Details [name] Date of Service: 12/09/2016 10:30 AM Patient Name: [name] Account Number: [id number] Medical Record Treating RN: [name] [id number] Number: Other Clinician: 25-Jun-1921 (83 y.o. Treating Priscilla Kirstein Date of Birth/Sex: Female) Provider/Extender: G Primary Care Provider: [name] Referring Provider: [name] in Treatment: 0 Debridement Performed for Wound #6 Left Calcaneus Assessment: Performed By: Physician [name], MD Debridement: Debridement Pre-procedure Verification/Time Out Yes - 11:27 Taken: Start Time: 11:28 Pain Control: Lidocaine 4% Topical Solution Level: Skin/Subcutaneous Tissue Total Area Debrided (L x 4.3 (cm) x 5 (cm) = 21.5 (cm) W): Tissue and other Viable, Non-Viable, Exudate, Fibrin/Slough, Subcutaneous material debrided: Instrument: Blade, Forceps Bleeding: Minimum Hemostasis Achieved: Pressure End Time: 11:30 Procedural Pain: 0 Post Procedural Pain: 0 Response to Treatment: Procedure was tolerated well Post Debridement Measurements of Total Wound Length: (cm) 4.3 Stage: Category/Stage II Width: (cm) 5 Depth: (cm) 0.3 Volume: (cm) 5.066 Character of Wound/Ulcer Post Requires Further Debridement: Debridement Post Procedure Diagnosis Same as Pre-procedure Electronic Signature(s) Signed: 12/09/2016 5:34:29 PM By:  Baltazar Najjar MD Signed: 12/09/2016 5:43:51 PM By: Georgina Snell ([id number]) Entered By: Baltazar Najjar on 12/09/2016 12:05:46 Klasen, [name] ([id number]) -------------------------------------------------------------------------------- Debridement Details [name] Date of Service: 12/09/2016 10:30 AM Patient Name: [name] Account Number: [id number] Medical Record Treating RN: [name] [id number] Number: Other Clinician: 1921/04/04 (83 y.o. Treating Zyaire Dumas Date of Birth/Sex: Female) Provider/Extender: G Primary Care Provider: [name] Referring Provider: [name] Weeks in Treatment: 0 Debridement Performed for Wound #8 Left,Posterior Lower Leg Assessment: Performed By: Physician [name], MD Debridement: Debridement Pre-procedure Verification/Time Out Yes - 11:32 Taken: Start Time: 11:32 Pain Control: Lidocaine 4% Topical Solution Level:  Skin/Subcutaneous Tissue Total Area Debrided (L x 4.5 (cm) x 3.4 (cm) = 15.3 (cm) W): Tissue and other Viable, Non-Viable, Exudate, Fibrin/Slough, Subcutaneous material debrided: Instrument: Blade, Curette, Forceps Bleeding: Minimum Hemostasis Achieved: Pressure End Time: 11:35 Procedural Pain: 0 Post Procedural Pain: 0 Response to Treatment: Procedure was tolerated well Post Debridement Measurements of Total Wound Length: (cm) 4.5 Stage: Category/Stage III Width: (cm) 3.4 Depth: (cm) 0.6 Volume: (cm) 7.21 Character of Wound/Ulcer Post Requires Further Debridement: Debridement Post Procedure Diagnosis Same as Pre-procedure Electronic Signature(s) Signed: 12/09/2016 5:34:29 PM By: Baltazar Najjarobson, Devynne Sturdivant MD Signed: 12/09/2016 5:43:51 PM By: Georgina SnellPinkerton, Debra Pink, Emmaclaire L. ([id number]) Entered By: Baltazar Najjarobson, Karman Veney on 12/09/2016 12:06:26 Axford, [name]  ([id number]) -------------------------------------------------------------------------------- HPI Details [name], Chandani Date of Service: 12/09/2016 10:30 AM Patient Name: [name] Account Number: [id number] Medical Record Treating RN: Phillis Haggisinkerton, Debi [id number] Number: Other Clinician: 06-20-1921 (83 y.o. Treating Egon Dittus Date of Birth/Sex: Female) Provider/Extender: G Primary Care Provider: Antony HasteBADGER, Hadlee Burback Referring Provider: Antony HasteBADGER, Tyrika Newman Weeks in Treatment: 0 History of Present Illness HPI Description: 03/24/16; this is an elderly 83 year old woman with advanced dementia who was hospitalized from 5/8 through 5/13. At that point she had Proteus sepsis felt to be secondary to a UTI the. Acute kidney injury and delirium. Her son who is her primary caregiver says she came home from the hospital with wounds on her right lower extremity and apparently her right buttock as well. There is no mention on the hospital discharge summary of problems. She has been having [facility] and have been using silver alginate based dressings. Apparently the area on the right buttock is healing and the son stated we did not need to become involved with that. In any case the patient has for wounds to on the right heel and 2 on the posterior lateral right calf, the latter of which is not a usual pressure area however that is the history. She is apparently eating and drinking fairly well. Takes ensure well. She does not have any pressure-relief surface at home. I have reviewed her lab work from the hospital. Her admission albumin was 3.4 on 5/8 04/01/16; patient arrives with wounds looking much the same as last week. She has an extensive pressure area over her right heel and to small but deep wounds on the lateral aspect of her right lower leg. These wounds are not connected. Although there are advertised his pressure ulcers these 2 wounds are not usual for pressure ulcerations. We have not  looked at the pressure ulceration on her buttock at the request of the patient's son who is the primary caregiver 04/08/16; wounds are stable to improved this week. Culture I did of the lower wound on her right lateral leg grew methicillin sensitive staph aureus she is on Septra. We are using silver alginate to all the wounds. 04/15/16; the 2 small areas on the lateral aspect of this lady's right leg continued to improve however the heel is once again covered with callus, residual alginate, acrotic material all of which requires a difficult debridement. There continues to be purulent material under this surface. This is in spite of a week of Septra that I gave him for MSSA 04/22/16 Patient today presents for follow-up evaluation. She is seen with her son in the office at this point in time she does have Alzheimer's. The good news is her wounds appeared to be somewhat smaller with current therapy. At this point in time the infection which was cultured previously appears to have improved in  regard to the right heel wound. There is no purulent drainage at this point in time and a good portion of the wound actually is eschar covered and dry with a medial portion actually open to granulation with very little slough covering. She really has no significant discomfort with palpation manipulation of the wound at this point in time. 04/28/16 patient presents today for follow-up evaluation concerning her right lateral calf wound as well as right heel wound. she has not really been complaining this for as pain is concerned according to her son seen with her in the office at this point in time today. She seems to show no interval signs or symptoms of infection overall has been tolerating the dressing changes well. She does have Alzheimer's therefore is not able to rate her pain although she does respond with an affirmative that she hurts when I press over the Guertin, Athenia L. ([id number]) heel  region. 05/05/16; the area on the lateral aspect of her right calf is healed. The right heel as 2 small spots that are still open and very close to resolving as well using Aquacel Ag under border foam, 05/20/16; the area on the lateral aspect of her right calf remains closed. The 2 small areas on her right heel are also closed. READMISSION 12/09/16; this is a 83 year old woman with advanced dementia that we cared for in the clinic from September to the beginning of November 2017. At that point she had a right heel, right lateral calf and right buttock wound all felt to be secondary to pressure. These eventually closed over. The patient's son who is the primary caregiver states that over the last 2 months she is developed bilateral lower extremity pressure ulcers. [facility] has [facility] home and they have been applying Medihoney and an alginate. These were apparently all pressure ulcers today including area on the left posterior calf. Apparently they were keeping her leg elevated on the pillow which contributed to this. The patient has advanced dementia, is nonambulatory and not able to move herself in bed. There is apparently not a nutritional issue. She has [facility]. Electronic Signature(s) Signed: 12/09/2016 5:34:29 PM By: Baltazar Najjar MD Entered By: Baltazar Najjar on 12/09/2016 12:11:08 Barajas, [name] ([id number]) -------------------------------------------------------------------------------- Physical Exam Details [name] Date of Service: 12/09/2016 10:30 AM Patient Name: [name] Account Number: [id number] Medical Record Treating RN: [name] [id number] Number: Other Clinician: [birth year] (83 y.o. Treating Kallee Nam Date of Birth/Sex: Female) Provider/Extender: G Primary Care Provider: [name] Referring Provider: [name] Weeks in Treatment: 0 Constitutional Patient is hypertensive.. Pulse regular and within target  range for patient.Marland Kitchen Respirations regular, non-labored and within target range.. Temperature is normal and within the target range for the patient.. Patient appears in no distress. Advanced dementia.. Eyes Conjunctivae clear. No discharge. Respiratory Respiratory effort is easy and symmetric bilaterally. Rate is normal at rest and on room air.. Cardiovascular Femoral arteries without bruits and pulses strong.. Pedal pulses palpable and strong bilaterally.. Edema present in both extremities. No major edema. Integumentary (Hair, Skin) No rash in the lower extremities is seen. Psychiatric No evidence of depression, anxiety, or agitation. Calm, cooperative, and communicative. Appropriate interactions and affect.. Notes Wound exam; the patient has the following wounds 1; extensive necrotic wounds on the right lateral heel and medial heel. #2 necrotic wound on the Achilles area of the left heel #3 wound on the left lateral malleolus with deep necrotic material #4 necrotic wound on the left posterior calf.  oNone of these appear to be infected. No cultures were done. There was no soft tissue tenderness or crepitus. All wounds require debridement with pickups and a scalpel to remove deep necrotic tissue. #5 curet used on the left lateral malleolus and left posterior calf again to remove necrotic material. Hemostasis in all areas direct pressure Electronic Signature(s) Signed: 12/09/2016 5:34:29 PM By: Baltazar Najjar MD Entered By: Baltazar Najjar on 12/09/2016 12:14:23 Wolke, [name] ([id number]) -------------------------------------------------------------------------------- Physician Orders Details [name] Date of Service: 12/09/2016 10:30 AM Patient Name: [name] Account Number: [id number] Medical Record Treating RN: [name] [id number] Number: Other Clinician: 1921-03-09 (83 y.o. Treating Singleton Hickox Date of Birth/Sex: Female) Provider/Extender: G Primary  Care Provider: [name] Referring Provider: [name] in Treatment: 0 Verbal / Phone Orders: No Diagnosis Coding Wound Cleansing Wound #5 Right Calcaneus o Clean wound with Normal Saline. o Cleanse wound with mild soap and water Wound #6 Left Calcaneus o Clean wound with Normal Saline. o Cleanse wound with mild soap and water Wound #7 Left,Lateral Malleolus o Clean wound with Normal Saline. o Cleanse wound with mild soap and water Wound #8 Left,Posterior Lower Leg o Clean wound with Normal Saline. o Cleanse wound with mild soap and water Anesthetic Wound #5 Right Calcaneus o Topical Lidocaine 4% cream applied to wound bed prior to debridement - for clinic use Wound #6 Left Calcaneus o Topical Lidocaine 4% cream applied to wound bed prior to debridement Wound #7 Left,Lateral Malleolus o Topical Lidocaine 4% cream applied to wound bed prior to debridement - for clinic use Wound #8 Left,Posterior Lower Leg o Topical Lidocaine 4% cream applied to wound bed prior to debridement - for clinic use Primary Wound Dressing Wound #5 Right Calcaneus o Iodoflex Padron, Quinesha L. ([id number]) Wound #6 Left Calcaneus o Iodoflex Wound #7 Left,Lateral Malleolus o Iodoflex Wound #8 Left,Posterior Lower Leg o Iodoflex Secondary Dressing Wound #5 Right Calcaneus o Dry Gauze o Other - Allevyn heel cup Wound #6 Left Calcaneus o Dry Gauze o Other - Allevyn heel cup Wound #7 Left,Lateral Malleolus o ABD pad o Dry Gauze o Foam - non adhesive foam Wound #8 Left,Posterior Lower Leg o ABD pad o Dry Gauze o Foam - non adhesive foam Dressing Change Frequency Wound #5 Right Calcaneus o Change Dressing Monday, Wednesday, Friday Wound #6 Left Calcaneus o Change Dressing Monday, Wednesday, Friday Wound #7 Left,Lateral Malleolus o Change Dressing Monday, Wednesday, Friday Wound #8 Left,Posterior Lower Leg o  Change Dressing Monday, Wednesday, Friday Follow-up Appointments Wound #5 Right Calcaneus o Return Appointment in 1 week. Wound #6 Left Calcaneus o Return Appointment in 1 week. JACQLYN, MAROLF ([id number]) Wound #7 Left,Lateral Malleolus o Return Appointment in 1 week. Wound #8 Left,Posterior Lower Leg o Return Appointment in 1 week. Edema Control Wound #5 Right Calcaneus o Kerlix and Coban - Bilateral - lightly wrapped from toes to 3 cm below the knee Wound #6 Left Calcaneus o Kerlix and Coban - Bilateral - lightly wrapped from toes to 3 cm below the knee Wound #7 Left,Lateral Malleolus o Kerlix and Coban - Bilateral - lightly wrapped from toes to 3 cm below the knee Wound #8 Left,Posterior Lower Leg o Kerlix and Coban - Bilateral - lightly wrapped from toes to 3 cm below the knee Additional Orders / Instructions Wound #5 Right Calcaneus o Increase protein intake. o Other: - Please add vitamin A, vitamin C and zinc supplements to your diet Wound #6 Left Calcaneus o Increase protein intake. o  Other: - Please add vitamin A, vitamin C and zinc supplements to your diet Wound #7 Left,Lateral Malleolus o Increase protein intake. o Other: - Please add vitamin A, vitamin C and zinc supplements to your diet Wound #8 Left,Posterior Lower Leg o Increase protein intake. o Other: - Please add vitamin A, vitamin C and zinc supplements to your diet Home Health Wound #5 Right Calcaneus o [facility] o Home Health Nurse may visit PRN to address patientos wound care needs. o FACE TO FACE ENCOUNTER: MEDICARE and MEDICAID PATIENTS: I certify that this patient is under my care and that I had a face-to-face encounter that meets the physician face-to-face encounter requirements with this patient on this date. The encounter with the patient was in whole or in part for the following MEDICAL CONDITION: (primary reason for Home  Healthcare) MEDICAL NECESSITY: I certify, that based on my findings, NURSING services are a medically necessary home health service. HOME BOUND STATUS: I certify that my clinical findings support that this patient is homebound (i.e., Due to illness or injury, pt requires aid of BAILIE, [place]. ([id number]) supportive devices such as crutches, cane, wheelchairs, walkers, the use of special transportation or the assistance of another person to leave their place of residence. There is a normal inability to leave the home and doing so requires considerable and taxing effort. Other absences are for medical reasons / religious services and are infrequent or of short duration when for other reasons). o If current dressing causes regression in wound condition, may D/C ordered dressing product/s and apply Normal Saline Moist Dressing daily until next Wound Healing Center / Other MD appointment. Notify Wound Healing Center of regression in wound condition at [phone]. o Please direct any NON-WOUND related issues/requests for orders to patient's Primary Care Physician Wound #6 Left Calcaneus o [facility] o Home Health Nurse may visit PRN to address patientos wound care needs. o FACE TO FACE ENCOUNTER: MEDICARE and MEDICAID PATIENTS: I certify that this patient is under my care and that I had a face-to-face encounter that meets the physician face-to-face encounter requirements with this patient on this date. The encounter with the patient was in whole or in part for the following MEDICAL CONDITION: (primary reason for Home Healthcare) MEDICAL NECESSITY: I certify, that based on my findings, NURSING services are a medically necessary home health service. HOME BOUND STATUS: I certify that my clinical findings support that this patient is homebound (i.e., Due to illness or injury, pt requires aid of supportive devices such as crutches, cane, wheelchairs,  walkers, the use of special transportation or the assistance of another person to leave their place of residence. There is a normal inability to leave the home and doing so requires considerable and taxing effort. Other absences are for medical reasons / religious services and are infrequent or of short duration when for other reasons). o If current dressing causes regression in wound condition, may D/C ordered dressing product/s and apply Normal Saline Moist Dressing daily until next Wound Healing Center / Other MD appointment. Notify Wound Healing Center of regression in wound condition at [phone]. o Please direct any NON-WOUND related issues/requests for orders to patient's Primary Care Physician Wound #7 Left,Lateral Malleolus o [facility] o Home Health Nurse may visit PRN to address patientos wound care needs. o FACE TO FACE ENCOUNTER: MEDICARE and MEDICAID PATIENTS: I certify that this patient is under my care and that I  had a face-to-face encounter that meets the physician face-to-face encounter requirements with this patient on this date. The encounter with the patient was in whole or in part for the following MEDICAL CONDITION: (primary reason for Home Healthcare) MEDICAL NECESSITY: I certify, that based on my findings, NURSING services are a medically necessary home health service. HOME BOUND STATUS: I certify that my clinical findings support that this patient is homebound (i.e., Due to illness or injury, pt requires aid of supportive devices such as crutches, cane, wheelchairs, walkers, the use of special transportation or the assistance of another person to leave their place of residence. There is a normal inability to leave the home and doing so requires considerable and taxing effort. Other absences are for medical reasons / religious services and are infrequent or of short duration when for other reasons). o If current dressing  causes regression in wound condition, may D/C ordered dressing product/s and apply Normal Saline Moist Dressing daily until next Wound Healing Center / Other MD appointment. Notify Wound Healing Center of regression in wound condition at [phone]. VERBLE, STYRON ([id number]) o Please direct any NON-WOUND related issues/requests for orders to patient's Primary Care Physician Wound #8 Left,Posterior Lower Leg o [facility] - [facility] o Home Health Nurse may visit PRN to address patientos wound care needs. o FACE TO FACE ENCOUNTER: MEDICARE and MEDICAID PATIENTS: I certify that this patient is under my care and that I had a face-to-face encounter that meets the physician face-to-face encounter requirements with this patient on this date. The encounter with the patient was in whole or in part for the following MEDICAL CONDITION: (primary reason for Home Healthcare) MEDICAL NECESSITY: I certify, that based on my findings, NURSING services are a medically necessary home health service. HOME BOUND STATUS: I certify that my clinical findings support that this patient is homebound (i.e., Due to illness or injury, pt requires aid of supportive devices such as crutches, cane, wheelchairs, walkers, the use of special transportation or the assistance of another person to leave their place of residence. There is a normal inability to leave the home and doing so requires considerable and taxing effort. Other absences are for medical reasons / religious services and are infrequent or of short duration when for other reasons). o If current dressing causes regression in wound condition, may D/C ordered dressing product/s and apply Normal Saline Moist Dressing daily until next Wound Healing Center / Other MD appointment. Notify Wound Healing Center of regression in wound condition at [phone]. o Please direct any NON-WOUND related issues/requests for orders to patient's  Primary Care Physician Electronic Signature(s) Signed: 12/09/2016 5:34:29 PM By: Baltazar Najjar MD Signed: 12/09/2016 5:43:51 PM By: [name] Entered By: [name] on 12/09/2016 11:54:00 Deblanc, [name] ([id number]) -------------------------------------------------------------------------------- Problem List Details [name] Date of Service: 12/09/2016 10:30 AM Patient Name: [name] Account Number: [id number] Medical Record Treating RN: [name] [id number] Number: Other Clinician: 10/09/1920 (83 y.o. 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 Account Number: [id number] Medical Record Treating RN: [name] [id number] Number: Other Clinician: [id number] (83 y.o. Treating Zandria Woldt Date of Birth/Sex: Female) Provider/Extender: G Primary Care Provider: [name] Referring Provider: [name] Weeks in Treatment: 0 Subjective Chief Complaint Information obtained from Patient Follow-up regarding right lateral calf wounds and  right heel pressure injury History of Present Illness (HPI) 03/24/16; this is an elderly 83 year old woman with advanced dementia who was hospitalized from 5/8 through 5/13. At that point she had Proteus sepsis felt to be secondary to a UTI the. Acute kidney injury and delirium. Her son who is her primary caregiver says she came home from the hospital with wounds on her right lower extremity and apparently her right buttock as well. There is no mention on the hospital discharge summary of problems. She has been having [facility] and have been using silver alginate based dressings. Apparently the area on the right buttock is healing and the son stated we did not need to become involved with that. In any case the patient has for wounds to on the right heel and 2 on the posterior lateral right calf, the latter of which is not a usual pressure area however that is the history. She is apparently eating and drinking fairly well. Takes ensure well. She does not have any pressure-relief surface at home. I have reviewed her lab work from the hospital. Her admission albumin was 3.4 on 5/8 04/01/16; patient arrives with wounds looking much the same as last week. She has an extensive pressure area over her right heel and to small but deep wounds on the lateral aspect of her right lower leg. These wounds are not connected. Although there are advertised his pressure ulcers these 2 wounds are not usual for pressure ulcerations. We have not looked at the pressure ulceration on her buttock at the request of the patient's son who is the primary caregiver 04/08/16; wounds are stable to improved this week. Culture I did of the lower wound on her right lateral leg grew methicillin sensitive staph aureus she is on Septra. We are using silver alginate to all the wounds. 04/15/16; the 2 small areas on the lateral aspect of this lady's right leg continued to improve however the heel is once again covered with  callus, residual alginate, acrotic material all of which requires a difficult debridement. There continues to be purulent material under this surface. This is in spite of a week of Septra that I gave him for MSSA 04/22/16 Patient today presents for follow-up evaluation. She is seen with her son in the office at this point in time she does have Alzheimer's. The good news is her wounds appeared to be somewhat smaller with current therapy. At this point in time the infection which was cultured previously appears to have improved in regard to the right heel wound. There is no purulent drainage at this point in time and a good portion of the wound actually is eschar covered and dry with a medial portion actually open to granulation with very little slough covering. She really has no significant discomfort with palpation manipulation of the wound at this point in time. AMBERLEY, HAMLER ([id number]) 04/28/16 patient presents today for follow-up evaluation concerning her right lateral calf wound as well as right heel wound. she has not really  been complaining this for as pain is concerned according to her son seen with her in the office at this point in time today. She seems to show no interval signs or symptoms of infection overall has been tolerating the dressing changes well. She does have Alzheimer's therefore is not able to rate her pain although she does respond with an affirmative that she hurts when I press over the heel region. 05/05/16; the area on the lateral aspect of her right calf is healed. The right heel as 2 small spots that are still open and very close to resolving as well using Aquacel Ag under border foam, 05/20/16; the area on the lateral aspect of her right calf remains closed. The 2 small areas on her right heel are also closed. READMISSION 12/09/16; this is a 83 year old woman with advanced dementia that we cared for in the clinic from September to the beginning of November  2017. At that point she had a right heel, right lateral calf and right buttock wound all felt to be secondary to pressure. These eventually closed over. The patient's son who is the primary caregiver states that over the last 2 months she is developed bilateral lower extremity pressure ulcers. [facility] and they have been applying Medihoney and an alginate. These were apparently all pressure ulcers today including area on the left posterior calf. Apparently they were keeping her leg elevated on the pillow which contributed to this. The patient has advanced dementia, is nonambulatory and not able to move herself in bed. There is apparently not a nutritional issue. [facility] has [facility]. Wound History Patient presents with 3 open wounds that have been present for approximately 2 months. Patient has been treating wounds in the following manner: medihoney. Laboratory tests have not been performed in the last month. Patient reportedly has not tested positive for an antibiotic resistant organism. Patient reportedly has not had testing performed to evaluate circulation in the legs. Patient experiences the following problems associated with their wounds: swelling. Patient History Information obtained from Patient. Allergies No known Allergies Family History Cancer - Siblings, Diabetes - Father, Mother, Heart Disease - Father, No family history of Hereditary Spherocytosis, Hypertension, Kidney Disease, Lung Disease, Seizures, Stroke, Thyroid Problems, Tuberculosis. Social History Never smoker, Marital Status - Widowed, Alcohol Use - Never, Drug Use - No History, Caffeine Use - Rarely. Medical And Surgical History Notes Neurologic MYHA, ARIZPE ([id number]) Alzeihmers Objective Constitutional Patient is hypertensive.. Pulse regular and within target range for patient.Marland Kitchen Respirations regular, non-labored and within target range.. Temperature is normal and  within the target range for the patient.. Patient appears in no distress. Advanced dementia.. Vitals Time Taken: 10:49 AM, Height: 69 in, Source: Stated, Weight: 105 lbs, Source: Measured, BMI: 15.5, Temperature: 98.2 F, Pulse: 95 bpm, Respiratory Rate: 16 breaths/min, Blood Pressure: 163/63 mmHg. Eyes Conjunctivae clear. No discharge. Respiratory Respiratory effort is easy and symmetric bilaterally. Rate is normal at rest and on room air.. Cardiovascular Femoral arteries without bruits and pulses strong.. Pedal pulses palpable and strong bilaterally.. Edema present in both extremities. No major edema. Psychiatric No evidence of depression, anxiety, or agitation. Calm, cooperative, and communicative. Appropriate interactions and affect.. General Notes: Wound exam; the patient has the following wounds 1; extensive necrotic wounds on the right lateral heel and medial heel. #2 necrotic wound on the Achilles area of the left heel #3 wound on the left lateral malleolus with deep necrotic material #4 necrotic wound on the left  posterior calf. None of these appear to be infected. No cultures were done. There was no soft tissue tenderness or crepitus. All wounds require debridement with pickups and a scalpel to remove deep necrotic tissue. #5 curet used on the left lateral malleolus and left posterior calf again to remove necrotic material. Hemostasis in all areas direct pressure Integumentary (Hair, Skin) No rash in the lower extremities is seen. Wound #5 status is Open. Original cause of wound was Pressure Injury. The wound is located on the Right Calcaneus. The wound measures 6cm length x 10cm width x 0.1cm depth; 47.124cm^2 area and 4.712cm^3 volume. There is no tunneling or undermining noted. There is a large amount of purulent drainage noted. The wound margin is distinct with the outline attached to the wound base. There is small Longshore, Malasia L. ([id number]) (1-33%) red granulation  within the wound bed. There is a large (67-100%) amount of necrotic tissue within the wound bed including Eschar and Adherent Slough. The periwound skin appearance exhibited: Erythema. The periwound skin appearance did not exhibit: Ecchymosis. The surrounding wound skin color is noted with erythema which is circumferential. Periwound temperature was noted as No Abnormality. The periwound has tenderness on palpation. Wound #6 status is Open. Original cause of wound was Pressure Injury. The wound is located on the Left Calcaneus. The wound measures 4.3cm length x 5cm width x 0.1cm depth; 16.886cm^2 area and 1.689cm^3 volume. There is no tunneling or undermining noted. There is a large amount of purulent drainage noted. The wound margin is distinct with the outline attached to the wound base. There is no granulation within the wound bed. There is a large (67-100%) amount of necrotic tissue within the wound bed including Eschar and Adherent Slough. The periwound skin appearance exhibited: Erythema. The surrounding wound skin color is noted with erythema which is circumferential. Periwound temperature was noted as No Abnormality. The periwound has tenderness on palpation. Wound #7 status is Open. Original cause of wound was Pressure Injury. The wound is located on the Left,Lateral Malleolus. The wound measures 2.1cm length x 2.2cm width x 0.2cm depth; 3.629cm^2 area and 0.726cm^3 volume. There is no tunneling or undermining noted. There is a large amount of purulent drainage noted. The wound margin is distinct with the outline attached to the wound base. There is no granulation within the wound bed. There is a large (67-100%) amount of necrotic tissue within the wound bed including Adherent Slough. The periwound skin appearance exhibited: Erythema. The surrounding wound skin color is noted with erythema which is circumferential. Periwound temperature was noted as No Abnormality. The periwound has  tenderness on palpation. Wound #8 status is Open. Original cause of wound was Pressure Injury. The wound is located on the Left,Posterior Lower Leg. The wound measures 4.5cm length x 3.4cm width x 0.6cm depth; 12.017cm^2 area and 7.21cm^3 volume. There is a large amount of purulent drainage noted. The wound margin is distinct with the outline attached to the wound base. There is no granulation within the wound bed. There is a large (67-100%) amount of necrotic tissue within the wound bed including Eschar and Adherent Slough. The periwound skin appearance exhibited: Erythema. The surrounding wound skin color is noted with erythema which is circumferential. Periwound temperature was noted as No Abnormality. The periwound has tenderness on palpation. 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 ([id number]) Procedures Wound #5 Pre-procedure diagnosis of Wound #5 is a Pressure Ulcer located on the Right Calcaneus . There was a Skin/Subcutaneous Tissue Debridement ([id number]) debridement with total area of 60 sq cm performed by [name], MD. with the following instrument(s): Blade and Forceps to remove Viable and Non-Viable tissue/material including Exudate, Fibrin/Slough, and Subcutaneous after achieving pain control using Lidocaine 4% Topical Solution. A time out was conducted at 11:27, prior to the start of the procedure. A Minimum amount of bleeding was controlled with Pressure. The procedure was tolerated well with a pain level of 0 throughout and a pain level of 0 following the procedure. Post Debridement Measurements: 6cm length x 10cm width x 0.1cm depth; 4.712cm^3 volume. Post debridement Stage noted as  Category/Stage III. Character of Wound/Ulcer Post Debridement requires further debridement. Post procedure Diagnosis Wound #5: Same as Pre-Procedure Wound #6 Pre-procedure diagnosis of Wound #6 is a Pressure Ulcer located on the Left Calcaneus . There was a Skin/Subcutaneous Tissue Debridement ([id number]) debridement with total area of 21.5 sq cm performed by [name], MD. with the following instrument(s): Blade and Forceps to remove Viable and Non-Viable tissue/material including Exudate, Fibrin/Slough, and Subcutaneous after achieving pain control using Lidocaine 4% Topical Solution. A time out was conducted at 11:27, prior to the start of the procedure. A Minimum amount of bleeding was controlled with Pressure. The procedure was tolerated well with a pain level of 0 throughout and a pain level of 0 following the procedure. Post Debridement Measurements: 4.3cm length x 5cm width x 0.3cm depth; 5.066cm^3 volume. Post debridement Stage noted as Category/Stage II. Character of Wound/Ulcer Post Debridement requires further debridement. Post procedure Diagnosis Wound #6: Same as Pre-Procedure Wound #8 Pre-procedure diagnosis of Wound #8 is a Pressure Ulcer located on the Left,Posterior Lower Leg . There was a Skin/Subcutaneous Tissue Debridement ([id number]) debridement with total area of 15.3 sq cm performed by [name], MD. with the following instrument(s): Blade, Curette, and Forceps to remove Viable and Non-Viable tissue/material including Exudate, Fibrin/Slough, and Subcutaneous after achieving pain control using Lidocaine 4% Topical Solution. A time out was conducted at 11:32, prior to the start of the procedure. A Minimum amount of bleeding was controlled with Pressure. The procedure was tolerated well with a pain level of 0 throughout and a pain level of 0 following the procedure. Post Debridement Measurements: 4.5cm length x 3.4cm width x 0.6cm depth; 7.21cm^3  volume. Post debridement Stage noted as Category/Stage III. Character of Wound/Ulcer Post Debridement requires further debridement. Post procedure Diagnosis Wound #8: Same as Pre-Procedure Zunker, Sharnee L. ([id number]) Plan Wound Cleansing: Wound #5 Right Calcaneus: Clean wound with Normal Saline. Cleanse wound with mild soap and water Wound #6 Left Calcaneus: Clean wound with Normal Saline. Cleanse wound with mild soap and water Wound #7 Left,Lateral Malleolus: Clean wound with Normal Saline. Cleanse wound with mild soap and water Wound #8 Left,Posterior Lower Leg: Clean wound with Normal Saline. Cleanse wound with mild soap and water Anesthetic: Wound #5 Right Calcaneus: Topical Lidocaine 4% cream applied to wound bed prior to debridement - for clinic use Wound #6 Left Calcaneus: Topical Lidocaine 4% cream applied to wound bed prior to debridement Wound #7 Left,Lateral Malleolus: Topical Lidocaine 4% cream applied to wound bed prior to debridement - for clinic use Wound #8 Left,Posterior Lower Leg: Topical Lidocaine 4% cream applied to wound bed prior to  debridement - for clinic use Primary Wound Dressing: Wound #5 Right Calcaneus: Iodoflex Wound #6 Left Calcaneus: Iodoflex Wound #7 Left,Lateral Malleolus: Iodoflex Wound #8 Left,Posterior Lower Leg: Iodoflex Secondary Dressing: Wound #5 Right Calcaneus: Dry Gauze Other - Allevyn heel cup Wound #6 Left Calcaneus: Dry Gauze Other - Allevyn heel cup Wound #7 Left,Lateral Malleolus: ABD pad Dry Gauze Foam - non adhesive foam Wound #8 Left,Posterior Lower Leg: ABD pad Dry Gauze Foam - non adhesive foam Dressing Change Frequency: Wound #5 Right Calcaneus: ANGI, GOODELL. ([id number]) Change Dressing Monday, Wednesday, Friday Wound #6 Left Calcaneus: Change Dressing Monday, Wednesday, Friday Wound #7 Left,Lateral Malleolus: Change Dressing Monday, Wednesday, Friday Wound #8 Left,Posterior Lower  Leg: Change Dressing Monday, Wednesday, Friday Follow-up Appointments: Wound #5 Right Calcaneus: Return Appointment in 1 week. Wound #6 Left Calcaneus: Return Appointment in 1 week. Wound #7 Left,Lateral Malleolus: Return Appointment in 1 week. Wound #8 Left,Posterior Lower Leg: Return Appointment in 1 week. Edema Control: Wound #5 Right Calcaneus: Kerlix and Coban - Bilateral - lightly wrapped from toes to 3 cm below the knee Wound #6 Left Calcaneus: Kerlix and Coban - Bilateral - lightly wrapped from toes to 3 cm below the knee Wound #7 Left,Lateral Malleolus: Kerlix and Coban - Bilateral - lightly wrapped from toes to 3 cm below the knee Wound #8 Left,Posterior Lower Leg: Kerlix and Coban - Bilateral - lightly wrapped from toes to 3 cm below the knee Additional Orders / Instructions: Wound #5 Right Calcaneus: Increase protein intake. Other: - Please add vitamin A, vitamin C and zinc supplements to your diet Wound #6 Left Calcaneus: Increase protein intake. Other: - Please add vitamin A, vitamin C and zinc supplements to your diet Wound #7 Left,Lateral Malleolus: Increase protein intake. Other: - Please add vitamin A, vitamin C and zinc supplements to your diet Wound #8 Left,Posterior Lower Leg: Increase protein intake. Other: - Please add vitamin A, vitamin C and zinc supplements to your diet Home Health: Wound #5 Right Calcaneus: Continue Home Health Visits - [facility] Nurse may visit PRN to address patient s wound care needs. FACE TO FACE ENCOUNTER: MEDICARE and MEDICAID PATIENTS: I certify that this patient is under my care and that I had a face-to-face encounter that meets the physician face-to-face encounter requirements with this patient on this date. The encounter with the patient was in whole or in part for the following MEDICAL CONDITION: (primary reason for Home Healthcare) MEDICAL NECESSITY: I certify, that based on my findings, NURSING services are a  medically necessary home health service. HOME BOUND STATUS: I certify that my clinical findings support that this patient is homebound (i.e., Due to illness or injury, pt requires aid of supportive devices such as crutches, cane, wheelchairs, walkers, the use of special transportation or the assistance of another person to leave their place of residence. There is a normal inability to leave the home and doing so requires considerable and taxing effort. Other absences are KYANDRA, MCCLAINE ([id number]) for medical reasons / religious services and are infrequent or of short duration when for other reasons). If current dressing causes regression in wound condition, may D/C ordered dressing product/s and apply Normal Saline Moist Dressing daily until next Wound Healing Center / Other MD appointment. Notify Wound Healing Center of regression in wound condition at [phone]. Please direct any NON-WOUND related issues/requests for orders to patient's Primary Care Physician Wound #6 Left Calcaneus: Continue Home Health Visits - [facility] Nurse may visit PRN to address  patient s wound care needs. FACE TO FACE ENCOUNTER: MEDICARE and MEDICAID PATIENTS: I certify that this patient is under my care and that I had a face-to-face encounter that meets the physician face-to-face encounter requirements with this patient on this date. The encounter with the patient was in whole or in part for the following MEDICAL CONDITION: (primary reason for Home Healthcare) MEDICAL NECESSITY: I certify, that based on my findings, NURSING services are a medically necessary home health service. HOME BOUND STATUS: I certify that my clinical findings support that this patient is homebound (i.e., Due to illness or injury, pt requires aid of supportive devices such as crutches, cane, wheelchairs, walkers, the use of special transportation or the assistance of another person to leave their place of residence. There  is a normal inability to leave the home and doing so requires considerable and taxing effort. Other absences are for medical reasons / religious services and are infrequent or of short duration when for other reasons). If current dressing causes regression in wound condition, may D/C ordered dressing product/s and apply Normal Saline Moist Dressing daily until next Wound Healing Center / Other MD appointment. Notify Wound Healing Center of regression in wound condition at [phone]. Please direct any NON-WOUND related issues/requests for orders to patient's Primary Care Physician Wound #7 Left,Lateral Malleolus: Continue Home Health Visits - [facility] Nurse may visit PRN to address patient s wound care needs. FACE TO FACE ENCOUNTER: MEDICARE and MEDICAID PATIENTS: I certify that this patient is under my care and that I had a face-to-face encounter that meets the physician face-to-face encounter requirements with this patient on this date. The encounter with the patient was in whole or in part for the following MEDICAL CONDITION: (primary reason for Home Healthcare) MEDICAL NECESSITY: I certify, that based on my findings, NURSING services are a medically necessary home health service. HOME BOUND STATUS: I certify that my clinical findings support that this patient is homebound (i.e., Due to illness or injury, pt requires aid of supportive devices such as crutches, cane, wheelchairs, walkers, the use of special transportation or the assistance of another person to leave their place of residence. There is a normal inability to leave the home and doing so requires considerable and taxing effort. Other absences are for medical reasons / religious services and are infrequent or of short duration when for other reasons). If current dressing causes regression in wound condition, may D/C ordered dressing product/s and apply Normal Saline Moist Dressing daily until next Wound Healing Center /  Other MD appointment. Notify Wound Healing Center of regression in wound condition at [phone]. Please direct any NON-WOUND related issues/requests for orders to patient's Primary Care Physician Wound #8 Left,Posterior Lower Leg: Continue Home Health Visits - [facility] Nurse may visit PRN to address patient s wound care needs. FACE TO FACE ENCOUNTER: MEDICARE and MEDICAID PATIENTS: I certify that this patient is under my care and that I had a face-to-face encounter that meets the physician face-to-face encounter requirements with this patient on this date. The encounter with the patient was in whole or in part for the following MEDICAL CONDITION: (primary reason for Home Healthcare) MEDICAL NECESSITY: I certify, that based on my findings, NURSING services are a medically necessary home health service. HOME BOUND STATUS: I certify that my clinical findings support that this patient is homebound (i.e., Due to illness or injury, pt requires aid of supportive devices such as crutches, cane, wheelchairs, walkers, the use of special transportation  or the assistance of another person to leave their place of residence. There is a normal inability to leave the home and doing so requires considerable and taxing effort. Other absences are EZRAH, DEMBECK ([id number]) for medical reasons / religious services and are infrequent or of short duration when for other reasons). If current dressing causes regression in wound condition, may D/C ordered dressing product/s and apply Normal Saline Moist Dressing daily until next Wound Healing Center / Other MD appointment. Notify Wound Healing Center of regression in wound condition at [phone]. Please direct any NON-WOUND related issues/requests for orders to patient's Primary Care Physician #1 we used Iodoflex, foam, Kerlix and Coban to all wounds and bilateral legs. #2 this can be changed 2 times a week on Monday and Friday by home health  and we will see her next week #3 I didn't think any cultures needed to be done I did not add any antibiotics. #4 we probably could justify x-raying her bilateral heels however I'm going to follow this clinically. #5 both arterial studies in her lower legs were noncompressible however her peripheral pulses were all robust. No additional evaluation for now #6 they have a pressure relief surface for her bed, they claim there is no nutritional issues. I have suggested bilateral by any boots, they apparently only have one Electronic Signature(s) Signed: 12/09/2016 5:34:29 PM By: Baltazar Najjar MD Entered By: Baltazar Najjar on 12/09/2016 12:16:51 Thakur, [name] ([id number]) -------------------------------------------------------------------------------- ROS/PFSH Details [name] Date of Service: 12/09/2016 10:30 AM Patient Name: [name] Account Number: [id number] Medical Record Treating RN: [name] [id number] Number: Other Clinician: Sep 03, 1920 (83 y.o. Treating Darian Cansler Date of Birth/Sex: Female) Provider/Extender: G Primary Care Provider: [name] Referring Provider: [name] Weeks in Treatment: 0 Information Obtained From Patient Wound History Do you currently have one or more open woundso Yes How many open wounds do you currently haveo 3 Approximately how long have you had your woundso 2 months How have you been treating your wound(s) until nowo medihoney Has your wound(s) ever healed and then re-openedo No Have you had any lab work done in the past montho No Have you tested positive for an antibiotic resistant organism (MRSA, VRE)o No Have you had any tests for circulation on your legso No Have you had other problems associated with your woundso Swelling Eyes Medical History: Positive for: Cataracts Ear/Nose/Mouth/Throat Medical History: Negative for: Chronic sinus problems/congestion; Middle ear  problems Hematologic/Lymphatic Medical History: Positive for: Anemia Negative for: Hemophilia; Human Immunodeficiency Virus; Lymphedema; Sickle Cell Disease Respiratory Medical History: Negative for: Aspiration; Asthma; Chronic Obstructive Pulmonary Disease (COPD); Pneumothorax; Sleep Apnea; Tuberculosis Cardiovascular Medical History: Positive for: Hypertension Negative for: Angina; Arrhythmia; Congestive Heart Failure; Coronary Artery Disease; Hypotension; Schubach, Thalya L. ([id number]) Myocardial Infarction; Peripheral Arterial Disease; Peripheral Venous Disease; Phlebitis; Vasculitis Gastrointestinal Medical History: Negative for: Cirrhosis ; Colitis; Crohnos; Hepatitis A; Hepatitis B; Hepatitis C Immunological Medical History: Negative for: Lupus Erythematosus; Raynaudos; Scleroderma Integumentary (Skin) Medical History: Positive for: History of pressure wounds Musculoskeletal Medical History: Positive for: Osteoarthritis Neurologic Medical History: Positive for: Dementia Past Medical History Notes: [facility] On[facility] Medical History: Negative for: Received Chemotherapy; Received Radiation HBO Extended History Items Eyes: Cataracts Immunizations Pneumococcal Vaccine: Received Pneumococcal Vaccination: No Family and Social History Cancer: Yes - Siblings; Diabetes: Yes - Father, Mother; Heart Disease: Yes - Father; Hereditary Spherocytosis: No; Hypertension: No; Kidney Disease: No; Lung Disease: No; Seizures: No; Stroke: No; Thyroid Problems: No; Tuberculosis: No; Never smoker; Marital Status - Widowed; Alcohol Use:  Never; Drug Use: No History; Caffeine Use: Rarely; Financial Concerns: No; Food, Clothing or Shelter Needs: No; Support System Lacking: No; Transportation Concerns: No; Do not resuscitate: Yes (Not Provided); Medical Power of Attorney: Yes - [employer] (Not Provided) JAXIE, RACANELLI ([id number]) Electronic Signature(s) Signed: 12/09/2016  5:34:29 PM By: Baltazar Najjar MD Signed: 12/09/2016 5:43:51 PM By: [name] Entered By: [name] on 12/09/2016 10:52:36 Bastin, [name] ([id number]) -------------------------------------------------------------------------------- SuperBill Details [name] Date of Service: 12/09/2016 Patient Name: [name] Account Number: [id number] Medical Record Treating RN: [name] [id number] Number: Other Clinician: [birth year] (83 y.o. 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 Facility Procedures CPT4 Code: [id number] Description: 99213 - WOUND CARE VISIT-LEV 3 EST PT Modifier: Quantity: 1 CPT4 Code: [id number] Description: 11042 - DEB SUBQ TISSUE 20 SQ CM/< ICD-10 Description Diagnosis L89.623 Pressure ulcer of left heel, stage 3 L89.613 Pressure ulcer of right heel, stage 3 L89.523 Pressure ulcer of left ankle, stage 3 L97.223 Non-pressure chronic ulcer of  left calf with necro Modifier: sis of muscle Quantity: 1 CPT4 Code: [id number] Description: 11045 - DEB SUBQ TISS EA ADDL 20CM ICD-10 Description Diagnosis L89.623 Pressure ulcer of left heel, stage 3 L89.613 Pressure ulcer of right heel, stage 3 L89.523 Pressure ulcer of left ankle, stage 3 L97.223 Non-pressure chronic ulcer of  left calf with necro Modifier: sis of muscle Quantity: 4 Physician Procedures CPT4 Code: [id number] ORAH, SONNEN Description: 99213 - WC PHYS LEVEL 3 - EST PT ICD-10 Description Diagnosis ABETH L. ([id number]) Modifier: Quantity: 1 Electronic  Signature(s) Signed: 12/09/2016 5:34:29 PM By: Baltazar Najjar MD Signed: 12/09/2016 5:43:51 PM By: [name] Entered By: [name] on 12/09/2016 12:34:34

## 2016-12-11 NOTE — Progress Notes (Signed)
[name], PENIX ([id number]) Visit Report for 12/09/2016 Allergy List Details [name], [name] Date of Service: 12/09/2016 10:30 AM Patient Name: [name] Account Number: [id number] Medical Record Treating RN: [name] [id number] Number: Other Clinician: Date of Birth/Sex: 03/16/1921 (83 y.o. Female) Treating [name], [name] Primary Care Zyria Fiscus: [name] Jessenya Berdan/Extender: G Referring Miho Monda: [name] Weeks in Treatment: 0 Allergies Active Allergies No known Allergies Allergy Notes Electronic Signature(s) Signed: 12/09/2016 5:43:51 PM By: [name] Entered By: [name] on 12/09/2016 10:50:44 Bambrick, [name] ([id number]) -------------------------------------------------------------------------------- Arrival Information Details [name] Date of Service: 12/09/2016 10:30 AM Patient Name: [name] Account Number: [id number] Medical Record Treating RN: [name] [id number] Number: Other Clinician: Date of Birth/Sex: February 02, 1921 (83 y.o. Female) Treating [name], [name] Primary Care Mahagony Grieb: [name] Dennice Tindol/Extender: G Referring Guerline Happ: [name] in Treatment: 0 Visit Information Patient Arrived: Wheel Chair Arrival Time: 10:43 Accompanied By: son Transfer Assistance: Other Patient Identification Verified: Yes Secondary Verification Process Yes Completed: Patient Requires Transmission- No Based Precautions: Patient Has Alerts: Yes Patient Alerts: L ABI non- compressible R ABI non- compressible History Since Last Visit All ordered tests and consults were completed: No Added or deleted any medications: No Any new allergies or adverse reactions: No Had a fall or experienced change in activities of daily living that may affect risk of falls: No Signs or symptoms of abuse/neglect since last visito No Hospitalized since last visit: No Electronic Signature(s) Signed: 12/09/2016 5:43:51  PM By: [name] Entered By: [name] on 12/09/2016 11:02:23 Goodridge, [name] ([id number]) -------------------------------------------------------------------------------- Clinic Level of Care Assessment Details [name] Date of Service: 12/09/2016 10:30 AM Patient Name: [name] Account Number: [id number] Medical Record Treating RN: [name] [id number] Number: Other Clinician: Date of Birth/Sex: [birth year] (83 y.o. Female) Treating [name], [name] Primary Care Jaterrius Ricketson: [name] Bridey Brookover/Extender: G Referring Kalyiah Saintil: [name] in Treatment: 0 Clinic Level of Care Assessment Items TOOL 1 Quantity Score X - Use when EandM and Procedure is performed on INITIAL visit 1 0 ASSESSMENTS - Nursing Assessment / Reassessment X - General Physical Exam (combine w/ comprehensive assessment (listed just 1 20 below) when performed on new pt. evals) X - Comprehensive Assessment (HX, ROS, Risk Assessments, Wounds Hx, etc.) 1 25 ASSESSMENTS - Wound and Skin Assessment / Reassessment []  - Dermatologic / Skin Assessment (not related to wound area) 0 ASSESSMENTS - Ostomy and/or Continence Assessment and Care []  - Incontinence Assessment and Management 0 []  - Ostomy Care Assessment and Management (repouching, etc.) 0 PROCESS - Coordination of Care []  - Simple Patient / Family Education for ongoing care 0 X - Complex (extensive) Patient / Family Education for ongoing care 1 20 X - Staff obtains Chiropractor, Records, Test Results / Process Orders 1 10 X - Staff telephones HHA, Nursing Homes / Clarify orders / etc 1 10 []  - Routine Transfer to another Facility (non-emergent condition) 0 []  - Routine Hospital Admission (non-emergent condition) 0 X - New Admissions / Manufacturing engineer / Ordering NPWT, Apligraf, etc. 1 15 []  - Emergency Hospital Admission (emergent condition) 0 PROCESS - Special Needs []  - Pediatric / Minor Patient Management  0 [name], [name] ([id number]) []  - Isolation Patient Management 0 []  - Hearing / Language / Visual special needs 0 []  - Assessment of Community assistance (transportation, D/C planning, etc.) 0 []  - Additional assistance / Altered mentation 0 []  - Support Surface(s) Assessment (bed, cushion, seat, etc.) 0 INTERVENTIONS - Miscellaneous []  - External ear exam 0 []  - Patient  Transfer (multiple staff / Nurse, adult / Similar devices) 0 []  - Simple Staple / Suture removal (25 or less) 0 []  - Complex Staple / Suture removal (26 or more) 0 []  - Hypo/Hyperglycemic Management (do not check if billed separately) 0 X - Ankle / Brachial Index (ABI) - do not check if billed separately 1 15 Has the patient been seen at the hospital within the last three years: Yes Total Score: 115 Level Of Care: New/Established - Level 3 Electronic Signature(s) Signed: 12/09/2016 5:43:51 PM By: [name] Entered By: [name] on 12/09/2016 12:34:25 Meals, [name] ([id number]) -------------------------------------------------------------------------------- Encounter Discharge Information Details [name] Date of Service: 12/09/2016 10:30 AM Patient Name: [name] Account Number: [id number] Medical Record Treating RN: [name] [id number] Number: Other Clinician: Date of Birth/Sex: Apr 24, 1921 (83 y.o. Female) Treating [name], [name] Primary Care [name]: [name] Kamran Coker/Extender: G Referring Virlee Stroschein: [name] in Treatment: 0 Encounter Discharge Information Items Discharge Pain Level: 0 Discharge Condition: Stable Ambulatory Status: Wheelchair Discharge Destination: Home Transportation: Private Auto Accompanied By: son Schedule Follow-up Appointment: Yes Medication Reconciliation completed and provided to Patient/Care No Nichalas Coin: Provided on Clinical Summary of Care: 12/09/2016 Form Type Recipient Paper Patient ES Electronic  Signature(s) Signed: 12/09/2016 5:43:51 PM By: [name] Previous Signature: 12/09/2016 11:54:31 AM Version By: Gwenlyn Perking Entered By: [name] on 12/09/2016 12:32:34 Roye, [name] -------------------------------------------------------------------------------- Lower Extremity Assessment Details [name] Date of Service: 12/09/2016 10:30 AM Patient Name: [name] Account Number: [id number] Medical Record Treating RN: [name] [id number] Number: Other Clinician: Date of Birth/Sex: 1920/08/21 (83 y.o. Female) Treating [name], [name] Primary Care Isabel Ardila: [name] Demondre Aguas/Extender: G Referring Schuyler Behan: [name] Weeks in Treatment: 0 Vascular Assessment Pulses: Dorsalis Pedis Palpable: [Left:Yes] [Right:Yes] Doppler Audible: [Left:Yes] [Right:Yes] Posterior Tibial Extremity colors, hair growth, and conditions: Extremity Color: [Left:Mottled] [Right:Mottled] Hair Growth on Extremity: [Left:Yes] [Right:Yes] Temperature of Extremity: [Left:Warm] [Right:Warm] Capillary Refill: [Left:< 3 seconds] [Right:< 3 seconds] Toe Nail Assessment Left: Right: Thick: Yes Yes Discolored: Yes Yes Deformed: Yes Yes Improper Length and Hygiene: Yes Yes Notes L ABI non-compressible R ABI non-compressible Electronic Signature(s) Signed: 12/09/2016 5:43:51 PM By: [name] Entered By: [name] on 12/09/2016 11:02:04 Guerrette, [name] -------------------------------------------------------------------------------- Multi Wound Chart Details [name] Date of Service: 12/09/2016 10:30 AM Patient Name: [name] Account Number: [id number] Medical Record Treating RN: [name] [id number] Number: Other Clinician: Date of Birth/Sex: 25-Oct-1920 (83 y.o. Female) Treating [name], [name] Primary Care Nashaly Dorantes: [name] Johanne Mcglade/Extender: G Referring Armond Cuthrell: [name] Weeks  in Treatment: 0 Vital Signs Height(in): 69 Pulse(bpm): 95 Weight(lbs): 105 Blood Pressure 163/63 (mmHg): Body Mass Index(BMI): 16 Temperature(F): 98.2 Respiratory Rate 16 (breaths/min): Photos: [5:No Photos] [6:No Photos] [7:No Photos] Wound Location: [5:Right Calcaneus] [6:Left Calcaneus] [7:Left Malleolus - Lateral] Wounding Event: [5:Pressure Injury] [6:Pressure Injury] [7:Pressure Injury] Primary Etiology: [5:Pressure Ulcer] [6:Pressure Ulcer] [7:Pressure Ulcer] Comorbid History: [5:Cataracts, Anemia, Hypertension, History of pressure wounds, Osteoarthritis, Dementia] [6:Cataracts, Anemia, Hypertension, History of pressure wounds, Osteoarthritis, Dementia] [7:Cataracts, Anemia, Hypertension, History of pressure  wounds, Osteoarthritis, Dementia] Date Acquired: [5:10/09/2016] [6:10/09/2016] [7:10/09/2016] Weeks of Treatment: [5:0] [6:0] [7:0] Wound Status: [5:Open] [6:Open] [7:Open] Clustered Wound: [5:Yes] [6:No] [7:No] Clustered Quantity: [5:2] [6:N/A] [7:N/A] Pending Amputation on Yes [6:Yes] [7:Yes] Presentation: Measurements L x W x D 6x10x0.1 [6:4.3x5x0.1] [7:2.1x2.2x0.2] (cm) Area (cm) : [5:47.124] [6:16.886] [7:3.629] Volume (cm) : [5:4.712] [6:1.689] [7:0.726] % Reduction in Area: [5:0.00%] [6:N/A] [7:N/A] % Reduction in Volume: 0.00% [6:N/A] [7:N/A] Classification: [5:Category/Stage III] [6:Category/Stage II] [7:Category/Stage II] Exudate  Amount: [5:Large] [6:Large] [7:Large] Exudate Type: [5:Purulent] [6:Purulent] [7:Purulent] Exudate Color: [5:yellow, brown, green] [6:yellow, brown, green] [7:yellow, brown, green] Foul Odor After [5:Yes] [6:Yes] [7:Yes] Cleansing: [5:No] [6:No] [7:No] Odor Anticipated Due to Product Use: Wound Margin: Distinct, outline attached Distinct, outline attached Distinct, outline attached Granulation Amount: Small (1-33%) None Present (0%) None Present (0%) Granulation Quality: Red N/A N/A Necrotic Amount: Large (67-100%) Large  (67-100%) Large (67-100%) Necrotic Tissue: Eschar, Adherent Slough Eschar, Adherent Slough Adherent Slough Epithelialization: None None None Debridement: Debridement (16109- Debridement (60454- N/A 11047) 11047) Pre-procedure 11:27 11:27 N/A Verification/Time Out Taken: Pain Control: Lidocaine 4% Topical Lidocaine 4% Topical N/A Solution Solution Tissue Debrided: Fibrin/Slough, Exudates, Fibrin/Slough, Exudates, N/A Subcutaneous Subcutaneous Level: Skin/Subcutaneous Skin/Subcutaneous N/A Tissue Tissue Debridement Area (sq 60 21.5 N/A cm): Instrument: Blade, Forceps Blade, Forceps N/A Bleeding: Minimum Minimum N/A Hemostasis Achieved: Pressure Pressure N/A Procedural Pain: 0 0 N/A Post Procedural Pain: 0 0 N/A Debridement Treatment Procedure was tolerated Procedure was tolerated N/A Response: well well Post Debridement 6x10x0.1 4.3x5x0.3 N/A Measurements L x W x D (cm) Post Debridement 4.712 5.066 N/A Volume: (cm) Post Debridement Category/Stage III Category/Stage II N/A Stage: Periwound Skin Texture: No Abnormalities Noted No Abnormalities Noted No Abnormalities Noted Periwound Skin No Abnormalities Noted No Abnormalities Noted No Abnormalities Noted Moisture: Periwound Skin Color: Erythema: Yes Erythema: Yes Erythema: Yes Ecchymosis: No Erythema Location: Circumferential Circumferential Circumferential Temperature: No Abnormality No Abnormality No Abnormality Tenderness on Yes Yes Yes Palpation: Wound Preparation: Ulcer Cleansing: Ulcer Cleansing: Ulcer Cleansing: Rinsed/Irrigated with Rinsed/Irrigated with Rinsed/Irrigated with Saline Saline Saline Topical Anesthetic Topical Anesthetic Topical Anesthetic Ramsaran, Lise L. ([id number]) Applied: Other: lidocaine Applied: Other: lidocaine Applied: Other: lidocaine 4% 4% 4% Procedures Performed: Debridement Debridement N/A Wound Number: 8 N/A N/A Photos: No Photos N/A N/A Wound Location: Left Lower Leg -  Posterior N/A N/A Wounding Event: Pressure Injury N/A N/A Primary Etiology: Pressure Ulcer N/A N/A Comorbid History: Cataracts, Anemia, N/A N/A Hypertension, History of pressure wounds, Osteoarthritis, Dementia Date Acquired: 10/09/2016 N/A N/A Weeks of Treatment: 0 N/A N/A Wound Status: Open N/A N/A Clustered Wound: No N/A N/A Clustered Quantity: N/A N/A N/A Pending Amputation on Yes N/A N/A Presentation: Measurements L x W x D 4.5x3.4x0.6 N/A N/A (cm) Area (cm) : 12.017 N/A N/A Volume (cm) : 7.21 N/A N/A % Reduction in Area: N/A N/A N/A % Reduction in Volume: N/A N/A N/A Classification: Category/Stage III N/A N/A Exudate Amount: Large N/A N/A Exudate Type: Purulent N/A N/A Exudate Color: yellow, brown, green N/A N/A Foul Odor After Yes N/A N/A Cleansing: Odor Anticipated Due to No N/A N/A Product Use: Wound Margin: Distinct, outline attached N/A N/A Granulation Amount: None Present (0%) N/A N/A Granulation Quality: N/A N/A N/A Necrotic Amount: Large (67-100%) N/A N/A Necrotic Tissue: Eschar, Adherent Slough N/A N/A Epithelialization: N/A N/A N/A Debridement: Debridement ([id number]- N/A N/A 11047) Pre-procedure 11:32 N/A N/A Verification/Time Out Taken: Pain Control: Lidocaine 4% Topical N/A N/A Solution Tissue Debrided: Fibrin/Slough, Exudates, N/A N/A Subcutaneous Eckert, Genie L. ([id number]) Level: Skin/Subcutaneous N/A N/A Tissue Debridement Area (sq 15.3 N/A N/A cm): Instrument: Blade, Curette, Forceps N/A N/A Bleeding: Minimum N/A N/A Hemostasis Achieved: Pressure N/A N/A Procedural Pain: 0 N/A N/A Post Procedural Pain: 0 N/A N/A Debridement Treatment Procedure was tolerated N/A N/A Response: well Post Debridement 4.5x3.4x0.6 N/A N/A Measurements L x W x D (cm) Post Debridement 7.21 N/A N/A Volume: (cm) Post Debridement Category/Stage III N/A N/A Stage: Periwound Skin Texture: No Abnormalities Noted N/A N/A Periwound Skin No Abnormalities  Noted N/A N/A Moisture: Periwound Skin Color: Erythema: Yes N/A N/A Erythema Location: Circumferential N/A N/A Temperature: No Abnormality N/A N/A Tenderness on Yes N/A N/A Palpation: Wound Preparation: Ulcer Cleansing: N/A N/A Rinsed/Irrigated with Saline Topical Anesthetic Applied: Other: lidocaine 4% Procedures Performed: Debridement N/A N/A Treatment Notes Electronic Signature(s) Signed: 12/09/2016 5:34:29 PM By: Baltazar Najjarobson, [name] MD Entered By: Baltazar Najjarobson, [name] on 12/09/2016 12:04:46 Creasy, [name] ([id number]) -------------------------------------------------------------------------------- Multi-Disciplinary Care Plan Details [name] BabeSUMMERS, Anahis Date of Service: 12/09/2016 10:30 AM Patient Name: [name] Account Number: [id number] Medical Record Treating RN: Phillis Haggisinkerton, Debi [id number] Number: Other Clinician: Date of Birth/Sex: 1920/07/25 (83 y.o. Female) Treating [name], [name] Primary Care Myeisha Kruser: Antony HasteBADGER, [name] Wister Hoefle/Extender: G Referring Erandy Mceachern: Wallene DalesBADGER, [name] Weeks in Treatment: 0 Active Inactive ` Abuse / Safety / Falls / Self Care Management Nursing Diagnoses: Potential for falls Goals: Patient will not experience any injury related to falls Date Initiated: 12/09/2016 Target Resolution Date: 02/20/2017 Goal Status: Active Interventions: Assess fall risk on admission and as needed Notes: ` Nutrition Nursing Diagnoses: Imbalanced nutrition Potential for alteratiion in Nutrition/Potential for imbalanced nutrition Goals: Patient/caregiver agrees to and verbalizes understanding of need to use nutritional supplements and/or vitamins as prescribed Date Initiated: 12/09/2016 Target Resolution Date: 03/27/2017 Goal Status: Active Interventions: Assess patient nutrition upon admission and as needed per policy Notes: ` Orientation to the Wound Care Program Coralee RudSUMMERS, Lolly L. ([id number]) Nursing Diagnoses: Knowledge deficit related to the wound  healing center program Goals: Patient/caregiver will verbalize understanding of the Wound Healing Center Program Date Initiated: 12/09/2016 Target Resolution Date: 12/26/2016 Goal Status: Active Interventions: Provide education on orientation to the wound center Notes: ` Pain, Acute or Chronic Nursing Diagnoses: Pain, acute or chronic: actual or potential Potential alteration in comfort, pain Goals: Patient/caregiver will verbalize adequate pain control between visits Date Initiated: 12/09/2016 Target Resolution Date: 03/27/2017 Goal Status: Active Interventions: Assess comfort goal upon admission Complete pain assessment as per visit requirements Notes: ` Pressure Nursing Diagnoses: Knowledge deficit related to causes and risk factors for pressure ulcer development Knowledge deficit related to management of pressures ulcers Potential for impaired tissue integrity related to pressure, friction, moisture, and shear Goals: Patient/caregiver will verbalize risk factors for pressure ulcer development Date Initiated: 12/09/2016 Target Resolution Date: 03/27/2017 Goal Status: Active Interventions: Assess: immobility, friction, shearing, incontinence upon admission and as needed Coralee RudSUMMERS, Tawonna L. ([id number]) Assess offloading mechanisms upon admission and as needed Notes: ` Wound/Skin Impairment Nursing Diagnoses: Impaired tissue integrity Knowledge deficit related to ulceration/compromised skin integrity Goals: Ulcer/skin breakdown will have a volume reduction of 80% by week 12 Date Initiated: 12/09/2016 Target Resolution Date: 03/20/2017 Goal Status: Active Interventions: Assess patient/caregiver ability to perform ulcer/skin care regimen upon admission and as needed Notes: Electronic Signature(s) Signed: 12/09/2016 5:43:51 PM By: Alejandro MullingPinkerton, Debra Entered By: Alejandro MullingPinkerton, Debra on 12/09/2016 11:22:45 Walts, [name]  ([id number]) -------------------------------------------------------------------------------- Pain Assessment Details [name] BabeSUMMERS, Ragan Date of Service: 12/09/2016 10:30 AM Patient Name: [name] Account Number: [id number] Medical Record Treating RN: Phillis Haggisinkerton, Debi [id number] Number: Other Clinician: Date of Birth/Sex: 1920/07/25 (83 y.o. Female) Treating [name], [name] Primary Care Talik Casique: Antony HasteBADGER, [name] Kohen Reither/Extender: G Referring Lynnda Wiersma: Antony HasteBADGER, [name] Weeks in Treatment: 0 Active Problems Location of Pain Severity and Description of Pain Patient Has Paino No Site Locations With Dressing Change: No Pain Management and Medication Current Pain Management: Electronic Signature(s) Signed: 12/09/2016 5:43:51 PM By: Alejandro MullingPinkerton, Debra Entered By: Alejandro MullingPinkerton, Debra on 12/09/2016 10:49:44 Schoch, [name] ([id number]) -------------------------------------------------------------------------------- Patient/Caregiver Education Details [name] BabeSUMMERS, Jazmen Date of Service: 12/09/2016 10:30 AM Patient Name:  [name] Account Number: [id number] Medical Record Treating RN: [name] [id number] Number: Other Clinician: [birth year] (83 y.o. Treating [name], [name] Date of Birth/Gender: Female) Physician/Extender: G Primary Care Weeks in Treatment: 0 BADGER, [name] Physician: Referring Physician: [name] Education Assessment Education Provided To: Caregiver son Education Topics Provided Welcome To The [facility] Center: Handouts: Welcome To The [facility] Methods: Explain/Verbal Responses: State content correctly Wound/Skin Impairment: Handouts: Other: change dressing as ordered Methods: Demonstration, Explain/Verbal Responses: State content correctly Electronic Signature(s) Signed: 12/09/2016 5:43:51 PM By: [name] Entered By: [name] on 12/09/2016 12:32:57 Wendorff, [name]  ([id number]) -------------------------------------------------------------------------------- Wound Assessment Details [name] Date of Service: 12/09/2016 10:30 AM Patient Name: [name] Account Number: [id number] Medical Record Treating RN: [name] [id number] Number: Other Clinician: Date of Birth/Sex: 03-16-1921 (83 y.o. Female) Treating [name], [name] Primary Care Asante Ritacco: [name] Marisa Hufstetler/Extender: G Referring Ariah Mower: [name] Weeks in Treatment: 0 Wound Status Wound Number: 5 Primary Pressure Ulcer Etiology: Wound Location: Right Calcaneus Wound Open Wounding Event: Pressure Injury Status: Date Acquired: 10/09/2016 Comorbid Cataracts, Anemia, Hypertension, Weeks Of Treatment: 0 History: History of pressure wounds, Clustered Wound: Yes Osteoarthritis, Dementia Photos Photo Uploaded By: [name] on 12/09/2016 17:34:36 Wound Measurements Length: (cm) 6 % Reduction in Width: (cm) 10 % Reduction in Depth: (cm) 0.1 Epithelializat Clustered Quantity: 2 Tunneling: Area: (cm) 47.124 Undermining: Volume: (cm) 4.712 Area: 0% Volume: 0% ion: None No No Wound Description Classification: Category/Stage III Foul Odor Afte Wound Margin: Distinct, outline attached Due to Product Exudate Amount: Large Slough/Fibrino Exudate Type: Purulent Exudate Color: yellow, brown, green r Cleansing: Yes Use: No Yes Wound Bed Granulation Amount: Small (1-33%) Shore, Noeli L. ([id number]) Granulation Quality: Red Necrotic Amount: Large (67-100%) Necrotic Quality: Eschar, Adherent Slough Periwound Skin Texture Texture Color No Abnormalities Noted: No No Abnormalities Noted: No Ecchymosis: No Moisture Erythema: Yes No Abnormalities Noted: No Erythema Location: Circumferential Temperature / Pain Temperature: No Abnormality Tenderness on Palpation: Yes Wound Preparation Ulcer Cleansing: Rinsed/Irrigated with Saline Topical  Anesthetic Applied: Other: lidocaine 4%, Electronic Signature(s) Signed: 12/09/2016 5:43:51 PM By: [name] Entered By: [name] on 12/09/2016 11:11:13 Stretch, [name] -------------------------------------------------------------------------------- Wound Assessment Details [name] Date of Service: 12/09/2016 10:30 AM Patient Name: [name] Account Number: [id number] Medical Record Treating RN: [name] [id number] Number: Other Clinician: Date of Birth/Sex: 10-29-1920 (83 y.o. Female) Treating [name], [name] Primary Care Brylan Dec: [name] Riya Huxford/Extender: G Referring Annica Marinello: [name] Weeks in Treatment: 0 Wound Status Wound Number: 6 Primary Pressure Ulcer Etiology: Wound Location: Left Calcaneus Wound Open Wounding Event: Pressure Injury Status: Date Acquired: 10/09/2016 Comorbid Cataracts, Anemia, Hypertension, Weeks Of Treatment: 0 History: History of pressure wounds, Clustered Wound: No Osteoarthritis, Dementia Photos Photo Uploaded By: [name] on 12/09/2016 17:34:36 Wound Measurements Length: (cm) 4.3 Width: (cm) 5 Depth: (cm) 0.1 Area: (cm) 16.886 Volume: (cm) 1.689 % Reduction in Area: % Reduction in Volume: Epithelialization: None Tunneling: No Undermining: No Wound Description Classification: Category/Stage II Foul Odor Afte Wound Margin: Distinct, outline attached Due to Product Exudate Amount: Large Slough/Fibrino Exudate Type: Purulent Exudate Color: yellow, brown, green r Cleansing: Yes Use: No Yes Wound Bed Granulation Amount: None Present (0%) Necrotic Amount: Large (67-100%) Bogus, Christia L. ([id number]) Necrotic Quality: Eschar, Adherent Slough Periwound Skin Texture Texture Color No Abnormalities Noted: No No Abnormalities Noted: No Erythema: Yes Moisture Erythema Location: Circumferential No Abnormalities Noted: No Temperature / Pain Temperature:  No Abnormality Tenderness on Palpation: Yes Wound Preparation Ulcer Cleansing: Rinsed/Irrigated with  Saline Topical Anesthetic Applied: Other: lidocaine 4%, Electronic Signature(s) Signed: 12/09/2016 5:43:51 PM By: [name] Entered By: [name] on 12/09/2016 11:10:55 Dispenza, [name] ([id number]) -------------------------------------------------------------------------------- Wound Assessment Details [name] Date of Service: 12/09/2016 10:30 AM Patient Name: [name] Account Number: [id number] Medical Record Treating RN: [name] [id number] Number: Other Clinician: Date of Birth/Sex: 07/25/1920 (83 y.o. Female) Treating [name], [name] Primary Care Kerem Gilmer: [name] Lillyanne Bradburn/Extender: G Referring Shamond Skelton: [name] Weeks in Treatment: 0 Wound Status Wound Number: 7 Primary Pressure Ulcer Etiology: Wound Location: Left Malleolus - Lateral Wound Open Wounding Event: Pressure Injury Status: Date Acquired: 10/09/2016 Comorbid Cataracts, Anemia, Hypertension, Weeks Of Treatment: 0 History: History of pressure wounds, Clustered Wound: No Osteoarthritis, Dementia Photos Photo Uploaded By: [name] on 12/09/2016 17:35:10 Wound Measurements Length: (cm) 2.1 Width: (cm) 2.2 Depth: (cm) 0.2 Area: (cm) 3.629 Volume: (cm) 0.726 % Reduction in Area: % Reduction in Volume: Epithelialization: None Tunneling: No Undermining: No Wound Description Classification: Category/Stage II Foul Odor Afte Wound Margin: Distinct, outline attached Due to Product Exudate Amount: Large Slough/Fibrino Exudate Type: Purulent Exudate Color: yellow, brown, green r Cleansing: Yes Use: No Yes Wound Bed Granulation Amount: None Present (0%) Necrotic Amount: Large (67-100%) Dec, Kalin L. ([id number]) Necrotic Quality: Adherent Slough Periwound Skin Texture Texture Color No Abnormalities Noted: No No Abnormalities Noted:  No Erythema: Yes Moisture Erythema Location: Circumferential No Abnormalities Noted: No Temperature / Pain Temperature: No Abnormality Tenderness on Palpation: Yes Wound Preparation Ulcer Cleansing: Rinsed/Irrigated with Saline Topical Anesthetic Applied: Other: lidocaine 4%, Electronic Signature(s) Signed: 12/09/2016 5:43:51 PM By: [name] Entered By: [name] on 12/09/2016 11:12:48 Macchia, [name] ([id number]) -------------------------------------------------------------------------------- Wound Assessment Details [name] Date of Service: 12/09/2016 10:30 AM Patient Name: [name] Account Number: [id number] Medical Record Treating RN: [name] [id number] Number: Other Clinician: Date of Birth/Sex: 12/19/1920 (83 y.o. Female) Treating [name], [name] Primary Care Jaythen Hamme: [name] Nela Bascom/Extender: G Referring Zabdiel Dripps: [name] Weeks in Treatment: 0 Wound Status Wound Number: [id number] Primary Pressure Ulcer Etiology: Wound Location: Left Lower Leg - Posterior Wound Open Wounding Event: Pressure Injury Status: Date Acquired: 10/09/2016 Comorbid Cataracts, Anemia, Hypertension, Weeks Of Treatment: 0 History: History of pressure wounds, Clustered Wound: No Osteoarthritis, Dementia Photos Photo Uploaded By: [name] on 12/09/2016 17:35:10 Wound Measurements Length: (cm) 4.5 % Reduction in Width: (cm) 3.4 % Reduction in Depth: (cm) 0.6 Area: (cm) 12.017 Volume: (cm) 7.21 Area: Volume: Wound Description Classification: Category/Stage III Foul Odor Afte Wound Margin: Distinct, outline attached Due to Product Exudate Amount: Large Slough/Fibrino Exudate Type: Purulent Exudate Color: yellow, brown, green r Cleansing: Yes Use: No Yes Wound Bed Granulation Amount: None Present (0%) Necrotic Amount: Large (67-100%) Novitski, Henley L. ([id number]) Necrotic Quality: Eschar, Adherent Slough Periwound Skin  Texture Texture Color No Abnormalities Noted: No No Abnormalities Noted: No Erythema: Yes Moisture Erythema Location: Circumferential No Abnormalities Noted: No Temperature / Pain Temperature: No Abnormality Tenderness on Palpation: Yes Wound Preparation Ulcer Cleansing: Rinsed/Irrigated with Saline Topical Anesthetic Applied: Other: lidocaine 4%, Electronic Signature(s) Signed: 12/09/2016 5:43:51 PM By: [name] Entered By: [name] on 12/09/2016 11:14:56 Delano, [name] -------------------------------------------------------------------------------- Vitals Details [name] Date of Service: 12/09/2016 10:30 AM Patient Name: [name] Account Number: [id number] Medical Record Treating RN: [name] [id number] Number: Other Clinician: Date of Birth/Sex: Oct 08, 1920 (83 y.o. Female) Treating [name], [name] Primary Care Kamil Hanigan: [name] Raeli Wiens/Extender: G Referring Abdulwahab Demelo: [name] Weeks in Treatment: 0 Vital Signs Time Taken: 10:49 Temperature (F): 98.2 Height (in):  69 Pulse (bpm): 95 Source: Stated Respiratory Rate (breaths/min): 16 Weight (lbs): 105 Blood Pressure (mmHg): 163/63 Source: Measured Reference Range: 80 - 120 mg / dl Body Mass Index (BMI): 15.5 Electronic Signature(s) Signed: 12/09/2016 5:43:51 PM By: [name] Entered By: [name] on 12/09/2016 10:50:21

## 2016-12-11 NOTE — Progress Notes (Signed)
[name], [name] ([id number]) Visit Report for 12/09/2016 Abuse/Suicide Risk Screen Details [name] Date of Service: 12/09/2016 10:30 AM Patient Name: [name] Account Number: [id number] Medical Record Treating RN: [name] [id number] Number: Other Clinician: 1921-07-11 (83 y.o. Treating [name], [name] Date of Birth/Sex: Female) [name]/Extender: G Primary Care Edge Mauger: [name] Referring Eason Housman: [name] Weeks in Treatment: 0 Abuse/Suicide Risk Screen Items Answer ABUSE/SUICIDE RISK SCREEN: Has anyone close to you tried to hurt or harm you recentlyo No Do you feel uncomfortable with anyone in your familyo No Has anyone forced you do things that you didnot want to doo No Do you have any thoughts of harming yourselfo No Patient displays signs or symptoms of abuse and/or neglect. No Electronic Signature(s) Signed: 12/09/2016 5:43:51 PM By: [name] Entered By: [name] on 12/09/2016 10:52:44 Branum, [name] -------------------------------------------------------------------------------- Activities of Daily Living Details [name] Date of Service: 12/09/2016 10:30 AM Patient Name: [name] Account Number: [id number] Medical Record Treating RN: [name] [id number] Number: Other Clinician: 09-05-1920 (83 y.o. Treating [name], [name] Date of Birth/Sex: Female) [name]/Extender: G Primary Care Haruo Stepanek: [name] Referring Kayelee Herbig: [name] Weeks in Treatment: 0 Activities of Daily Living Items Answer Activities of Daily Living (Please select one for each item) Drive Automobile Not Able Take Medications Not Able Use Telephone Not Able Care for Appearance Not Able Use Toilet Not Able Bath / Shower Not Able Dress Self Not Able Feed Self Not Able Walk Not Able Get In / Out Bed Not Able Housework Not Able Prepare Meals Not Able Handle Money Not Able Shop for Self Not  Able Electronic Signature(s) Signed: 12/09/2016 5:43:51 PM By: [name] Entered By: [name] on 12/09/2016 10:53:09 Gutterman, [name] -------------------------------------------------------------------------------- Education Assessment Details [name] Date of Service: 12/09/2016 10:30 AM Patient Name: [name] Account Number: [id number] Medical Record Treating RN: [name] [id number] Number: Other Clinician: 1921/05/07 (83 y.o. Treating [name], [name] Date of Birth/Sex: Female) [name]/Extender: G Primary Care Brodey Bonn: [name] Referring Josselin Gaulin: [name] in Treatment: 0 Primary Learner Assessed: Caregiver son Reason Patient is not Primary Learner: pt has dementia Learning Preferences/Education Level/Primary Language Learning Preference: Explanation, Printed Material Highest Education Level: College or Above Preferred Language: Economist Assessment/Beliefs Language Barrier: No Translator Needed: No Memory Deficit: No Emotional Barrier: No Cultural/Religious Beliefs Affecting Medical No Care: Physical Barrier Assessment Impaired Vision: Yes Glasses Impaired Hearing: No Decreased Hand dexterity: No Knowledge/Comprehension Assessment Knowledge Level: Medium Comprehension Level: Medium Ability to understand written Medium instructions: Ability to understand verbal Medium instructions: Motivation Assessment Anxiety Level: Calm Cooperation: Cooperative Education Importance: Acknowledges Need Interest in Health Problems: Asks Questions Perception: Coherent Willingness to Engage in Self- Medium Management Activities: [name] ([id number]) Readiness to Engage in Self- Medium Management Activities: Electronic Signature(s) Signed: 12/09/2016 5:43:51 PM By: [name] Entered By: [name] on 12/09/2016 10:53:46 [name], [name]  ([id number]) -------------------------------------------------------------------------------- Fall Risk Assessment Details [name] Date of Service: 12/09/2016 10:30 AM Patient Name: [name] Account Number: [id number] Medical Record Treating RN: [name] [id number] Number: Other Clinician: [birth year] (83 y.o. Treating [name], [name] Date of Birth/Sex: Female) Lauryl Seyer/Extender: G Primary Care Kadarious Dikes: [name] Referring Jolinda Pinkstaff: [name] in Treatment: 0 Fall Risk Assessment Items Have you had 2 or more falls in the last 12 monthso 0 No Have you had any fall that resulted in injury in the last 12 monthso 0 No FALL RISK ASSESSMENT: History of falling - immediate or within 3 months  0 No Secondary diagnosis 15 Yes Ambulatory aid None/bed rest/wheelchair/nurse 0 Yes Crutches/cane/walker 0 No Furniture 0 No IV Access/Saline Lock 0 No Gait/Training Normal/bed rest/immobile 0 No Weak 10 Yes Impaired 20 Yes Mental Status Oriented to own ability 0 No Electronic Signature(s) Signed: 12/09/2016 5:43:51 PM By: Alejandro MullingPinkerton, Debra Entered By: Alejandro MullingPinkerton, Debra on 12/09/2016 10:54:00 Scholes, [name] ([id number]) -------------------------------------------------------------------------------- Foot Assessment Details [name] BabeSUMMERS, [name] Date of Service: 12/09/2016 10:30 AM Patient Name: [name] Account Number: [id number] Medical Record Treating RN: Phillis Haggisinkerton, Debi [id number] Number: Other Clinician: June 08, 1921 (83 y.o. Treating [name], [name] Date of Birth/Sex: Female) Salimata Christenson/Extender: G Primary Care Rosaelena Kemnitz: Antony HasteBADGER, [name] Referring Larina Lieurance: Antony HasteBADGER, [name] Weeks in Treatment: 0 Foot Assessment Items [x]  Unable to perform due to altered mental status Site Locations + = Sensation present, - = Sensation absent, C = Callus, U = Ulcer R = Redness, W = Warmth, M = Maceration, PU = Pre-ulcerative lesion F = Fissure, S = Swelling, D =  Dryness Assessment Right: Left: Other Deformity: No No Prior Foot Ulcer: No No Prior Amputation: No No Charcot Joint: No No Ambulatory Status: Gait: Electronic Signature(s) Signed: 12/09/2016 5:43:51 PM By: Krystal EatonPinkerton, Debra Thissen, [name] ([id number]) Entered By: Alejandro MullingPinkerton, Debra on 12/09/2016 10:54:17 Fogal, [name] ([id number]) -------------------------------------------------------------------------------- Nutrition Risk Assessment Details [name] BabeSUMMERS, Pura Date of Service: 12/09/2016 10:30 AM Patient Name: [name] Account Number: [id number] Medical Record Treating RN: Phillis Haggisinkerton, Debi [id number] Number: Other Clinician: June 08, 1921 (83 y.o. Treating [name], [name] Date of Birth/Sex: Female) Jencarlos Nicolson/Extender: G Primary Care Rj Pedrosa: Antony HasteBADGER, [name] Referring Cristofher Livecchi: Antony HasteBADGER, [name] Weeks in Treatment: 0 Height (in): 69 Weight (lbs): 105 Body Mass Index (BMI): 15.5 Nutrition Risk Assessment Items NUTRITION RISK SCREEN: I have an illness or condition that made me change the kind and/or 2 Yes amount of food I eat I eat fewer than two meals per day 0 No I eat few fruits and vegetables, or milk products 0 No I have three or more drinks of beer, liquor or wine almost every day 0 No I have tooth or mouth problems that make it hard for me to eat 0 No I don't always have enough money to buy the food I need 0 No I eat alone most of the time 0 No I take three or more different prescribed or over-the-counter drugs a 1 Yes day Without wanting to, I have lost or gained 10 pounds in the last six 0 No months I am not always physically able to shop, cook and/or feed myself 0 No Nutrition Protocols Good Risk Protocol Moderate Risk Protocol Electronic Signature(s) Signed: 12/09/2016 5:43:51 PM By: Alejandro MullingPinkerton, Debra Entered By: Alejandro MullingPinkerton, Debra on 12/09/2016 10:54:09

## 2016-12-16 ENCOUNTER — Ambulatory Visit
Admission: RE | Admit: 2016-12-16 | Discharge: 2016-12-16 | Disposition: A | Discharge status: Home / Self Care | Payer: Medicare Other | Source: Ambulatory Visit | Attending: Internal Medicine | Admitting: Internal Medicine

## 2016-12-16 ENCOUNTER — Encounter: Payer: Medicare Other | Admitting: Internal Medicine

## 2016-12-16 ENCOUNTER — Other Ambulatory Visit: Payer: Self-pay | Admitting: Internal Medicine

## 2016-12-16 DIAGNOSIS — R918 Other nonspecific abnormal finding of lung field: Secondary | ICD-10-CM | POA: Insufficient documentation

## 2016-12-16 DIAGNOSIS — I509 Heart failure, unspecified: Secondary | ICD-10-CM

## 2016-12-16 DIAGNOSIS — I517 Cardiomegaly: Secondary | ICD-10-CM | POA: Insufficient documentation

## 2016-12-16 DIAGNOSIS — L89623 Pressure ulcer of left heel, stage 3: Secondary | ICD-10-CM | POA: Diagnosis not present

## 2016-12-16 DIAGNOSIS — J90 Pleural effusion, not elsewhere classified: Secondary | ICD-10-CM | POA: Insufficient documentation

## 2016-12-16 DIAGNOSIS — F0390 Unspecified dementia without behavioral disturbance: Secondary | ICD-10-CM | POA: Insufficient documentation

## 2016-12-17 NOTE — Progress Notes (Addendum)
[name] ([id number]) Visit Report for 12/16/2016 Chief Complaint Document Details [name] Date of Service: 12/16/2016 2:30 PM Patient Name: [name] Account Number: [id number] Medical Record Treating RN: [name] [id number] Number: Other Clinician: 17-Aug-1920 (83 y.o. Treating [name], [name] Date of Birth/Sex: Female) Provider/Extender: G Primary Care Provider: [name] Referring Provider: [name] in Treatment: 1 Information Obtained from: Patient Chief Complaint Follow-up regarding right lateral calf wounds and right heel pressure injury Electronic Signature(s) Signed: 12/16/2016 4:44:14 PM By: Baltazar Najjar MD Entered By: Baltazar Najjar on 12/16/2016 15:29:46 [name] -------------------------------------------------------------------------------- Debridement Details [name] Date of Service: 12/16/2016 2:30 PM Patient Name: [name] Account Number: [id number] Medical Record Treating RN: [name] [id number] Number: Other Clinician: [birth year] (83 y.o. Treating [name], [name] Date of Birth/Sex: Female) Provider/Extender: G Primary Care Provider: [name] Referring Provider: [name] in Treatment: 1 Debridement Performed for Wound #6 Left Calcaneus Assessment: Performed By: Physician [name], MD Debridement: Debridement Pre-procedure Verification/Time Out Yes - 15:20 Taken: Start Time: 15:21 Pain Control: Lidocaine 4% Topical Solution Level: Skin/Subcutaneous Tissue Total Area Debrided (L x 2.8 (cm) x 3.7 (cm) = 10.36 (cm) W): Tissue and other Viable, Non-Viable, Exudate, Fibrin/Slough, Subcutaneous material debrided: Instrument: Blade, Forceps Bleeding: Minimum Hemostasis Achieved: Pressure End Time: 15:23 Procedural Pain: 0 Post Procedural Pain: 0 Response to Treatment: Procedure was tolerated well Post Debridement Measurements of  Total Wound Length: (cm) 2.8 Stage: Category/Stage II Width: (cm) 3.7 Depth: (cm) 0.2 Volume: (cm) 1.627 Character of Wound/Ulcer Post Requires Further Debridement: Debridement Post Procedure Diagnosis Same as Pre-procedure Electronic Signature(s) Signed: 12/16/2016 4:44:14 PM By: Baltazar Najjar MD Signed: 12/16/2016 5:13:17 PM By: Georgina Snell ([id number]) Entered By: Baltazar Najjar on 12/16/2016 15:29:37 [name] ([id number]) -------------------------------------------------------------------------------- HPI Details [name] Date of Service: 12/16/2016 2:30 PM Patient Name: [name] Account Number: [id number] Medical Record Treating RN: [name] [id number] Number: Other Clinician: 11/06/1920 (83 y.o. Treating [name], [name] Date of Birth/Sex: Female) Provider/Extender: G Primary Care Provider: [name] Referring Provider: [name] in Treatment: 1 History of Present Illness HPI Description: 03/24/16; this is an elderly 83 year old woman with advanced dementia who was hospitalized from 5/8 through 5/13. At that point she had Proteus sepsis felt to be secondary to a UTI the. Acute kidney injury and delirium. Her son who is her primary caregiver says she came home from the hospital with wounds on her right lower extremity and apparently her right buttock as well. There is no mention on the hospital discharge summary of problems. She has been having [facility] and have been using silver alginate based dressings. Apparently the area on the right buttock is healing and the son stated we did not need to become involved with that. In any case the patient has for wounds to on the right heel and 2 on the posterior lateral right calf, the latter of which is not a usual pressure area however that is the history. She is apparently eating and drinking fairly well. Takes ensure well. She does not have any  pressure-relief surface at home. I have reviewed her lab work from the hospital. Her admission albumin was 3.4 on 5/8 04/01/16; patient arrives with wounds looking much the same as last week. She has an extensive pressure area over her right heel and to small but deep wounds on the lateral aspect of her right lower leg. These wounds are not connected. Although there are advertised his pressure ulcers  these 2 wounds are not usual for pressure ulcerations. We have not looked at the pressure ulceration on her buttock at the request of the patient's son who is the primary caregiver 04/08/16; wounds are stable to improved this week. Culture I did of the lower wound on her right lateral leg grew methicillin sensitive staph aureus she is on Septra. We are using silver alginate to all the wounds. 04/15/16; the 2 small areas on the lateral aspect of this lady's right leg continued to improve however the heel is once again covered with callus, residual alginate, acrotic material all of which requires a difficult debridement. There continues to be purulent material under this surface. This is in spite of a week of Septra that I gave him for MSSA 04/22/16 Patient today presents for follow-up evaluation. She is seen with her son in the office at this point in time she does have Alzheimer's. The good news is her wounds appeared to be somewhat smaller with current therapy. At this point in time the infection which was cultured previously appears to have improved in regard to the right heel wound. There is no purulent drainage at this point in time and a good portion of the wound actually is eschar covered and dry with a medial portion actually open to granulation with very little slough covering. She really has no significant discomfort with palpation manipulation of the wound at this point in time. 04/28/16 patient presents today for follow-up evaluation concerning her right lateral calf wound as well as right  heel wound. she has not really been complaining this for as pain is concerned according to her son seen with her in the office at this point in time today. She seems to show no interval signs or symptoms of infection overall has been tolerating the dressing changes well. She does have Alzheimer's therefore is not able to rate her pain although she does respond with an affirmative that she hurts when I press over the Steinkamp, Henya L. ([id number]) heel region. 05/05/16; the area on the lateral aspect of her right calf is healed. The right heel as 2 small spots that are still open and very close to resolving as well using Aquacel Ag under border foam, 05/20/16; the area on the lateral aspect of her right calf remains closed. The 2 small areas on her right heel are also closed. READMISSION 12/09/16; this is a 83 year old woman with advanced dementia that we cared for in the clinic from September to the beginning of November 2017. At that point she had a right heel, right lateral calf and right buttock wound all felt to be secondary to pressure. These eventually closed over. The patient's son who is the primary caregiver states that over the last 2 months she is developed bilateral lower extremity pressure ulcers. [facility] has [facility] at home and they have been applying Medihoney and an alginate. These were apparently all pressure ulcers today including area on the left posterior calf. Apparently they were keeping her leg elevated on the pillow which contributed to this. The patient has advanced dementia, is nonambulatory and not able to move herself in bed. There is apparently not a nutritional issue. She has [facility]. 12/16/16; patient was readmitted to the clinic last week with multiple wounds including the medial and lateral aspects of the left calcaneus left posterior calf right calcaneus. We've been using Iodoflex, dressing change by home health. Her son also reports that  she has had trouble breathing which she  attributes to the low air loss mattress we have for pressure relief. He states that when he took her out of bed and put her in her chair breathing improved. As of Monday he took the low air loss mattress off the bed and states that she is able to go to bed without shortness of breath. His description sounded a lot like orthopnea. He is asking [place] to order a gel mattress overlay. According to her son she does not aspirate at least not frequently Electronic Signature(s) Signed: 12/16/2016 4:44:14 PM By: Baltazar Najjar MD Entered By: Baltazar Najjar on 12/16/2016 15:33:37 Greening, Jake [name] ([id number]) -------------------------------------------------------------------------------- Physical Exam Details [name] Date of Service: 12/16/2016 2:30 PM Patient Name: [name] Account Number: [id number] Medical Record Treating RN: [name] [id number] Number: Other Clinician: [birth year] (83 y.o. Treating [name], [name] Date of Birth/Sex: Female) Provider/Extender: G Primary Care Provider: [name] Referring Provider: [name] Weeks in Treatment: 1 Constitutional Sitting or standing Blood Pressure is within target range for patient.. Pulse regular and within target range for patient.. Cheyne-Stokes respiration with respiratory rate in the high 30s. Temperature is normal and within the target range for the patient.Marland Kitchen appears in no distress. Neck Neck supple and symmetrical. No masses or crepitus. Respiratory Above normal respiratory effort noted. Respiratory rate elveaated. Decreased air entry bilaterally bronchial on the left crackles on the right.. Cardiovascular Heart rhythm and rate regular, without murmur or gallop. JVP is visible perhaps slightly elevated. Pedal pulses palpable and strong bilaterally.. Gastrointestinal (GI) Abdomen is soft and non-distended without masses or tenderness. Bowel sounds active in all  quadrants.. No liver or spleen enlargement or tenderness.. Genitourinary (GU) Bladder without fullness, masses or tenderness.Marland Kitchen Psychiatric Advanced dementia, minimally verbal. Notes Wound exam; 1 extensive necrotic wounds on the left lateral heel and medial heel. #2 wound on the left posterior calf #3 extensive wound across the Achilles on the right lateral heel #4 wound on the right calf using a pickups and scalpel I remove copious amounts of necrotic material from the left medial heel.. The base of this does not look too problematic. Electronic Signature(s) Signed: 12/16/2016 4:44:14 PM By: Baltazar Najjar MD Entered By: Baltazar Najjar on 12/16/2016 15:39:41 Hilley, Jake [name] ([id number]) -------------------------------------------------------------------------------- Physician Orders Details [name] Date of Service: 12/16/2016 2:30 PM Patient Name: [name] Account Number: [id number] Medical Record Treating RN: [name] [id number] Number: Other Clinician: [birth year] (83 y.o. Treating [name], [name] Date of Birth/Sex: Female) Provider/Extender: G Primary Care Provider: [name] Referring Provider: [name] in Treatment: 1 Verbal / Phone Orders: Yes Clinician: Pinkerton, Debi Read Back and Verified: Yes Diagnosis Coding Wound Cleansing Wound #5 Right Calcaneus o Clean wound with Normal Saline. o Cleanse wound with mild soap and water Wound #6 Left Calcaneus o Clean wound with Normal Saline. o Cleanse wound with mild soap and water Wound #7 Left,Lateral Malleolus o Clean wound with Normal Saline. o Cleanse wound with mild soap and water Wound #8 Left,Posterior Lower Leg o Clean wound with Normal Saline. o Cleanse wound with mild soap and water Anesthetic Wound #5 Right Calcaneus o Topical Lidocaine 4% cream applied to wound bed prior to debridement - for clinic use Wound #6 Left Calcaneus o Topical  Lidocaine 4% cream applied to wound bed prior to debridement Wound #7 Left,Lateral Malleolus o Topical Lidocaine 4% cream applied to wound bed prior to debridement - for clinic use Wound #8 Left,Posterior Lower Leg o Topical Lidocaine 4% cream applied to wound bed prior to debridement -  for clinic use Primary Wound Dressing Wound #5 Right Calcaneus o Iodoflex Gallon, Mickala L. ([id number]) Wound #6 Left Calcaneus o Iodoflex Wound #7 Left,Lateral Malleolus o Iodoflex Wound #8 Left,Posterior Lower Leg o Iodoflex Secondary Dressing Wound #5 Right Calcaneus o Dry Gauze o Other - Allevyn heel cup Wound #6 Left Calcaneus o Dry Gauze o Other - Allevyn heel cup Wound #7 Left,Lateral Malleolus o ABD pad o Dry Gauze o Foam - non adhesive foam Wound #8 Left,Posterior Lower Leg o ABD pad o Dry Gauze o Foam - non adhesive foam Dressing Change Frequency Wound #5 Right Calcaneus o Change Dressing Monday, Wednesday, Friday Wound #6 Left Calcaneus o Change Dressing Monday, Wednesday, Friday Wound #7 Left,Lateral Malleolus o Change Dressing Monday, Wednesday, Friday Wound #8 Left,Posterior Lower Leg o Change Dressing Monday, Wednesday, Friday Follow-up Appointments Wound #5 Right Calcaneus o Return Appointment in 1 week. Wound #6 Left Calcaneus o Return Appointment in 1 week. [name], [name] ([id number]) Wound #7 Left,Lateral Malleolus o Return Appointment in 1 week. Wound #8 Left,Posterior Lower Leg o Return Appointment in 1 week. Edema Control Wound #5 Right Calcaneus o Kerlix and Coban - Bilateral - lightly wrapped from toes to 3 cm below the knee Wound #6 Left Calcaneus o Kerlix and Coban - Bilateral - lightly wrapped from toes to 3 cm below the knee Wound #7 Left,Lateral Malleolus o Kerlix and Coban - Bilateral - lightly wrapped from toes to 3 cm below the knee Wound #8 Left,Posterior Lower Leg o Kerlix and  Coban - Bilateral - lightly wrapped from toes to 3 cm below the knee Off-Loading o Mattress - ************* HHRN to order GEL Mattress Overlay*************** o Turn and reposition every 2 hours Additional Orders / Instructions Wound #5 Right Calcaneus o Increase protein intake. o Other: - Please add vitamin A, vitamin C and zinc supplements to your diet Wound #6 Left Calcaneus o Increase protein intake. o Other: - Please add vitamin A, vitamin C and zinc supplements to your diet Wound #7 Left,Lateral Malleolus o Increase protein intake. o Other: - Please add vitamin A, vitamin C and zinc supplements to your diet Wound #8 Left,Posterior Lower Leg o Increase protein intake. o Other: - Please add vitamin A, vitamin C and zinc supplements to your diet Home Health Wound #5 Right Calcaneus o [facility] o Home Health Nurse may visit PRN to address patientos wound care needs. o FACE TO FACE ENCOUNTER: MEDICARE and MEDICAID PATIENTS: I certify that this patient is under my care and that I had a face-to-face encounter that meets the physician face-to-face encounter requirements with this patient on this date. The encounter with the patient was in [place]. ([id number]) whole or in part for the following MEDICAL CONDITION: (primary reason for Home Healthcare) MEDICAL NECESSITY: I certify, that based on my findings, NURSING services are a medically necessary home health service. HOME BOUND STATUS: I certify that my clinical findings support that this patient is homebound (i.e., Due to illness or injury, pt requires aid of supportive devices such as crutches, cane, wheelchairs, walkers, the use of special transportation or the assistance of another person to leave their place of residence. There is a normal inability to leave the home and doing so requires considerable and taxing effort. Other absences are for medical reasons /  religious services and are infrequent or of short duration when for other reasons). o If current dressing causes regression in wound condition, may D/C ordered dressing product/s and  apply Normal Saline Moist Dressing daily until next Wound Healing Center / Other MD appointment. Notify Wound Healing Center of regression in wound condition at [phone]. o Please direct any NON-WOUND related issues/requests for orders to patient's Primary Care Physician Wound #6 Left Calcaneus o [facility] - [facility] o Home Health Nurse may visit PRN to address patientos wound care needs. o FACE TO FACE ENCOUNTER: MEDICARE and MEDICAID PATIENTS: I certify that this patient is under my care and that I had a face-to-face encounter that meets the physician face-to-face encounter requirements with this patient on this date. The encounter with the patient was in whole or in part for the following MEDICAL CONDITION: (primary reason for Home Healthcare) MEDICAL NECESSITY: I certify, that based on my findings, NURSING services are a medically necessary home health service. HOME BOUND STATUS: I certify that my clinical findings support that this patient is homebound (i.e., Due to illness or injury, pt requires aid of supportive devices such as crutches, cane, wheelchairs, walkers, the use of special transportation or the assistance of another person to leave their place of residence. There is a normal inability to leave the home and doing so requires considerable and taxing effort. Other absences are for medical reasons / religious services and are infrequent or of short duration when for other reasons). o If current dressing causes regression in wound condition, may D/C ordered dressing product/s and apply Normal Saline Moist Dressing daily until next Wound Healing Center / Other MD appointment. Notify Wound Healing Center of regression in wound condition at [phone]. o Please  direct any NON-WOUND related issues/requests for orders to patient's Primary Care Physician Wound #7 Left,Lateral Malleolus o [facility] o Home Health Nurse may visit PRN to address patientos wound care needs. o FACE TO FACE ENCOUNTER: MEDICARE and MEDICAID PATIENTS: I certify that this patient is under my care and that I had a face-to-face encounter that meets the physician face-to-face encounter requirements with this patient on this date. The encounter with the patient was in whole or in part for the following MEDICAL CONDITION: (primary reason for Home Healthcare) MEDICAL NECESSITY: I certify, that based on my findings, NURSING services are a medically necessary home health service. HOME BOUND STATUS: I certify that my clinical findings support that this patient is homebound (i.e., Due to illness or injury, pt requires aid of supportive devices such as crutches, cane, wheelchairs, walkers, the use of special transportation or the assistance of another person to leave their place of residence. There is a normal inability to leave the home and doing so requires considerable and taxing effort. Other [name], [name] ([id number]) absences are for medical reasons / religious services and are infrequent or of short duration when for other reasons). o If current dressing causes regression in wound condition, may D/C ordered dressing product/s and apply Normal Saline Moist Dressing daily until next Wound Healing Center / Other MD appointment. Notify Wound Healing Center of regression in wound condition at [phone]. o Please direct any NON-WOUND related issues/requests for orders to patient's Primary Care Physician Wound #8 Left,Posterior Lower Leg o [facility] - [facility] o Home Health Nurse may visit PRN to address patientos wound care needs. o FACE TO FACE ENCOUNTER: MEDICARE and MEDICAID PATIENTS: I certify that this patient  is under my care and that I had a face-to-face encounter that meets the physician face-to-face encounter requirements with this patient on this date. The encounter with the patient  was in whole or in part for the following MEDICAL CONDITION: (primary reason for Home Healthcare) MEDICAL NECESSITY: I certify, that based on my findings, NURSING services are a medically necessary home health service. HOME BOUND STATUS: I certify that my clinical findings support that this patient is homebound (i.e., Due to illness or injury, pt requires aid of supportive devices such as crutches, cane, wheelchairs, walkers, the use of special transportation or the assistance of another person to leave their place of residence. There is a normal inability to leave the home and doing so requires considerable and taxing effort. Other absences are for medical reasons / religious services and are infrequent or of short duration when for other reasons). o If current dressing causes regression in wound condition, may D/C ordered dressing product/s and apply Normal Saline Moist Dressing daily until next Wound Healing Center / Other MD appointment. Notify Wound Healing Center of regression in wound condition at [phone]. o Please direct any NON-WOUND related issues/requests for orders to patient's Primary Care Physician Radiology o X-ray, Chest - r/o CHF, r/o aspiration Electronic Signature(s) Signed: 12/16/2016 4:44:14 PM By: Baltazar Najjar MD Signed: 12/16/2016 5:13:17 PM By: [name] Entered By: [name] on 12/16/2016 16:00:22 Andel, Jake [name] ([id number]) -------------------------------------------------------------------------------- Problem List Details [name] Date of Service: 12/16/2016 2:30 PM Patient Name: [name] Account Number: [id number] Medical Record Treating RN: [name] [id number] Number: Other Clinician: September 16, 1920 (83 y.o. 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 Account Number: [id number] Medical Record Treating RN: [name] [id number] Number: Other Clinician: Nov 29, 1920 (83 y.o. Treating [name], [name] Date of Birth/Sex: Female) Provider/Extender: G Primary Care Provider: [name] Referring Provider: [name] in Treatment: 1 Subjective Chief Complaint Information obtained from Patient Follow-up regarding right lateral calf wounds and right heel pressure injury History of Present Illness (HPI) 03/24/16; this is an elderly 83 year old woman with advanced dementia who was hospitalized from 5/8 through 5/13. At that point she had Proteus sepsis felt to be secondary to a UTI the. Acute kidney injury and delirium. Her son who is her primary caregiver says she came home from the hospital with wounds on her right lower extremity and apparently her right buttock as well. There is no mention on the hospital discharge summary of problems. She has been having [facility] and have been using silver  alginate based dressings. Apparently the area on the right buttock is healing and the son stated we did not need to become involved with that. In any case the patient has for wounds to on the right heel and 2 on the posterior lateral right calf, the latter of which is not a usual pressure area however that is the history. She is apparently eating and drinking fairly well. Takes ensure well. She does not have any pressure-relief surface at home.  I have reviewed her lab work from the hospital. Her admission albumin was 3.4 on 5/8 04/01/16; patient arrives with wounds looking much the same as last week. She has an extensive pressure area over her right heel and to small but deep wounds on the lateral aspect of her right lower leg. These wounds are not connected. Although there are advertised his pressure ulcers these 2 wounds are not usual for pressure ulcerations. We have not looked at the pressure ulceration on her buttock at the request of the patient's son who is the primary caregiver 04/08/16; wounds are stable to improved this week. Culture I did of the lower wound on her right lateral leg grew methicillin sensitive staph aureus she is on Septra. We are using silver alginate to all the wounds. 04/15/16; the 2 small areas on the lateral aspect of this lady's right leg continued to improve however the heel is once again covered with callus, residual alginate, acrotic material all of which requires a difficult debridement. There continues to be purulent material under this surface. This is in spite of a week of Septra that I gave him for MSSA 04/22/16 Patient today presents for follow-up evaluation. She is seen with her son in the office at this point in time she does have Alzheimer's. The good news is her wounds appeared to be somewhat smaller with current therapy. At this point in time the infection which was cultured previously appears to have improved in regard to the right heel wound. There is  no purulent drainage at this point in time and a good portion of the wound actually is eschar covered and dry with a medial portion actually open to granulation with very little slough covering. She really has no significant discomfort with palpation manipulation of the wound at this point in time. [name], [name] ([id number]) 04/28/16 patient presents today for follow-up evaluation concerning her right lateral calf wound as well as right heel wound. she has not really been complaining this for as pain is concerned according to her son seen with her in the office at this point in time today. She seems to show no interval signs or symptoms of infection overall has been tolerating the dressing changes well. She does have Alzheimer's therefore is not able to rate her pain although she does respond with an affirmative that she hurts when I press over the heel region. 05/05/16; the area on the lateral aspect of her right calf is healed. The right heel as 2 small spots that are still open and very close to resolving as well using Aquacel Ag under border foam, 05/20/16; the area on the lateral aspect of her right calf remains closed. The 2 small areas on her right heel are also closed. READMISSION 12/09/16; this is a 83 year old woman with advanced dementia that we cared for in the clinic from September to the beginning of November 2017. At that point she had a right heel, right lateral calf and right buttock wound all felt to be secondary to pressure. These eventually closed over. The patient's son who is the primary caregiver states that over the last 2 months she is developed bilateral lower extremity pressure ulcers. [facility] has [facility] and they have been applying Medihoney and an alginate. These were apparently all pressure ulcers today including area on the left posterior calf. Apparently they were keeping her leg elevated on the pillow which contributed to this. The patient  has advanced dementia, is nonambulatory and not able  to move herself in bed. There is apparently not a nutritional issue. [facility] has [facility]. 12/16/16; patient was readmitted to the clinic last week with multiple wounds including the medial and lateral aspects of the left calcaneus left posterior calf right calcaneus. We've been using Iodoflex, dressing change by home health. Her son also reports that she has had trouble breathing which she attributes to the low air loss mattress we have for pressure relief. He states that when he took her out of bed and put her in her chair breathing improved. As of Monday he took the low air loss mattress off the bed and states that she is able to go to bed without shortness of breath. His description sounded a lot like orthopnea. He is asking [place] to order a gel mattress overlay. According to her son she does not aspirate at least not frequently Objective Constitutional Sitting or standing Blood Pressure is within target range for patient.. Pulse regular and within target range for patient.. Cheyne-Stokes respiration with respiratory rate in the high 30s. Temperature is normal and within the target range for the patient.Marland Kitchen appears in no distress. Vitals Time Taken: 2:37 PM, Height: 69 in, Weight: 105 lbs, BMI: 15.5, Pulse: 74 bpm, Respiratory Rate: 16 breaths/min, Blood Pressure: 118/63 mmHg. Crace, Kaytlynn L. ([id number]) Neck Neck supple and symmetrical. No masses or crepitus. Respiratory Above normal respiratory effort noted. Respiratory rate elveaated. Decreased air entry bilaterally bronchial on the left crackles on the right.. Cardiovascular Heart rhythm and rate regular, without murmur or gallop. JVP is visible perhaps slightly elevated. Pedal pulses palpable and strong bilaterally.. Gastrointestinal (GI) Abdomen is soft and non-distended without masses or tenderness. Bowel sounds active in all quadrants.. No liver or spleen enlargement  or tenderness.. Genitourinary (GU) Bladder without fullness, masses or tenderness.Marland Kitchen Psychiatric Advanced dementia, minimally verbal. General Notes: Wound exam; 1 extensive necrotic wounds on the left lateral heel and medial heel. #2 wound on the left posterior calf #3 extensive wound across the Achilles on the right lateral heel #4 wound on the right calf using a pickups and scalpel I remove copious amounts of necrotic material from the left medial heel.. The base of this does not look too problematic. Integumentary (Hair, Skin) Wound #5 status is Open. Original cause of wound was Pressure Injury. The wound is located on the Right Calcaneus. The wound measures 3.5cm length x 9.9cm width x 0.4cm depth; 27.214cm^2 area and 10.886cm^3 volume. There is no tunneling or undermining noted. There is a large amount of purulent drainage noted. The wound margin is distinct with the outline attached to the wound base. There is small (1-33%) red granulation within the wound bed. There is a large (67-100%) amount of necrotic tissue within the wound bed including Eschar and Adherent Slough. The periwound skin appearance exhibited: Erythema. The periwound skin appearance did not exhibit: Ecchymosis. The surrounding wound skin color is noted with erythema which is circumferential. Periwound temperature was noted as No Abnormality. The periwound has tenderness on palpation. Wound #6 status is Open. Original cause of wound was Pressure Injury. The wound is located on the Left Calcaneus. The wound measures 2.8cm length x 3.7cm width x 0.1cm depth; 8.137cm^2 area and 0.814cm^3 volume. There is no tunneling or undermining noted. There is a large amount of purulent drainage noted. The wound margin is distinct with the outline attached to the wound base. There is no granulation within the wound bed. There is a large (67-100%) amount of necrotic tissue within the wound  bed including Eschar. The periwound skin  appearance exhibited: Erythema. The surrounding wound skin color is noted with erythema which is circumferential. Periwound temperature was noted as No Abnormality. The periwound has tenderness on palpation. Wound #7 status is Open. Original cause of wound was Pressure Injury. The wound is located on the Left,Lateral Malleolus. The wound measures 2.1cm length x 2.3cm width x 0.3cm depth; 3.793cm^2 area and 1.138cm^3 volume. There is no tunneling or undermining noted. There is a large amount of [name], [name] serosanguineous drainage noted. The wound margin is distinct with the outline attached to the wound base. There is small (1-33%) red granulation within the wound bed. There is a large (67-100%) amount of necrotic tissue within the wound bed including Adherent Slough. The periwound skin appearance exhibited: Erythema. The surrounding wound skin color is noted with erythema which is circumferential. Periwound temperature was noted as No Abnormality. The periwound has tenderness on palpation. Wound #8 status is Open. Original cause of wound was Pressure Injury. The wound is located on the Left,Posterior Lower Leg. The wound measures 4.4cm length x 3.5cm width x 0.8cm depth; 12.095cm^2 area and 9.676cm^3 volume. There is no tunneling or undermining noted. There is a large amount of purulent drainage noted. The wound margin is distinct with the outline attached to the wound base. There is small (1-33%) red granulation within the wound bed. There is a large (67-100%) amount of necrotic tissue within the wound bed including Eschar and Adherent Slough. The periwound skin appearance exhibited: Erythema. The surrounding wound skin color is noted with erythema which is circumferential. Periwound temperature was noted as No Abnormality. The periwound has tenderness on palpation. 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 There was a Skin/Subcutaneous Tissue Debridement [id number]) debridement with total area of 10.36 sq cm performed by Maxwell CaulOBSON, [name] G, MD. with the following instrument(s): Blade and Forceps to remove Viable and Non-Viable tissue/material including Exudate, Fibrin/Slough, and Subcutaneous after achieving pain control using Lidocaine 4% Topical Solution. A time out was conducted at 15:20, prior to the start of the procedure. A Minimum amount of bleeding was controlled with Pressure. The procedure was tolerated well with a pain level of 0 throughout and a pain level of 0 following the procedure. Post Debridement Measurements: 2.8cm length x 3.7cm width x 0.2cm depth; 1.627cm^3 volume. Post debridement Stage noted as Category/Stage II. Character of Wound/Ulcer Post Debridement requires further debridement. Post procedure Diagnosis Wound #6: Same as Pre-Procedure [name], [name] ([id number]) Plan Wound Cleansing: Wound #5 Right Calcaneus: Clean wound with Normal Saline. Cleanse wound with mild soap and water Wound #6 Left Calcaneus: Clean wound with Normal Saline. Cleanse wound with mild soap and water Wound #7 Left,Lateral Malleolus: Clean wound with Normal Saline. Cleanse wound with mild soap and water Wound #8 Left,Posterior Lower Leg: Clean wound with Normal Saline. Cleanse wound with mild soap and water Anesthetic: Wound #5 Right Calcaneus: Topical Lidocaine 4% cream applied to wound bed prior to debridement - for clinic use Wound #6 Left Calcaneus: Topical Lidocaine 4% cream applied to wound bed prior to debridement Wound #7 Left,Lateral Malleolus: Topical Lidocaine 4%  cream applied  to wound bed prior to debridement - for clinic use Wound #8 Left,Posterior Lower Leg: Topical Lidocaine 4% cream applied to wound bed prior to debridement - for clinic use Primary Wound Dressing: Wound #5 Right Calcaneus: Iodoflex Wound #6 Left Calcaneus: Iodoflex Wound #7 Left,Lateral Malleolus: Iodoflex Wound #8 Left,Posterior Lower Leg: Iodoflex Secondary Dressing: Wound #5 Right Calcaneus: Dry Gauze Other - Allevyn heel cup Wound #6 Left Calcaneus: Dry Gauze Other - Allevyn heel cup Wound #7 Left,Lateral Malleolus: ABD pad Dry Gauze Foam - non adhesive foam Wound #8 Left,Posterior Lower Leg: ABD pad Dry Gauze [name], [name] ([id number]) Foam - non adhesive foam Dressing Change Frequency: Wound #5 Right Calcaneus: Change Dressing Monday, Wednesday, Friday Wound #6 Left Calcaneus: Change Dressing Monday, Wednesday, Friday Wound #7 Left,Lateral Malleolus: Change Dressing Monday, Wednesday, Friday Wound #8 Left,Posterior Lower Leg: Change Dressing Monday, Wednesday, Friday Follow-up Appointments: Wound #5 Right Calcaneus: Return Appointment in 1 week. Wound #6 Left Calcaneus: Return Appointment in 1 week. Wound #7 Left,Lateral Malleolus: Return Appointment in 1 week. Wound #8 Left,Posterior Lower Leg: Return Appointment in 1 week. Edema Control: Wound #5 Right Calcaneus: Kerlix and Coban - Bilateral - lightly wrapped from toes to 3 cm below the knee Wound #6 Left Calcaneus: Kerlix and Coban - Bilateral - lightly wrapped from toes to 3 cm below the knee Wound #7 Left,Lateral Malleolus: Kerlix and Coban - Bilateral - lightly wrapped from toes to 3 cm below the knee Wound #8 Left,Posterior Lower Leg: Kerlix and Coban - Bilateral - lightly wrapped from toes to 3 cm below the knee Off-Loading: Mattress - ************* HHRN to order GEL Mattress Overlay*************** Turn and reposition every 2 hours Additional Orders / Instructions: Wound #5  Right Calcaneus: Increase protein intake. Other: - Please add vitamin A, vitamin C and zinc supplements to your diet Wound #6 Left Calcaneus: Increase protein intake. Other: - Please add vitamin A, vitamin C and zinc supplements to your diet Wound #7 Left,Lateral Malleolus: Increase protein intake. Other: - Please add vitamin A, vitamin C and zinc supplements to your diet Wound #8 Left,Posterior Lower Leg: Increase protein intake. Other: - Please add vitamin A, vitamin C and zinc supplements to your diet Home Health: Wound #5 Right Calcaneus: Continue Home Health Visits - [facility] Nurse may visit PRN to address patient s wound care needs. FACE TO FACE ENCOUNTER: MEDICARE and MEDICAID PATIENTS: I certify that this patient is under my care and that I had a face-to-face encounter that meets the physician face-to-face encounter requirements with this patient on this date. The encounter with the patient was in whole or in part for the [name], [name]. ([id number]) following MEDICAL CONDITION: (primary reason for Home Healthcare) MEDICAL NECESSITY: I certify, that based on my findings, NURSING services are a medically necessary home health service. HOME BOUND STATUS: I certify that my clinical findings support that this patient is homebound (i.e., Due to illness or injury, pt requires aid of supportive devices such as crutches, cane, wheelchairs, walkers, the use of special transportation or the assistance of another person to leave their place of residence. There is a normal inability to leave the home and doing so requires considerable and taxing effort. Other absences are for medical reasons / religious services and are infrequent or of short duration when for other reasons). If current dressing causes regression in wound condition, may D/C ordered dressing product/s and apply Normal Saline Moist Dressing daily until next Wound Healing Center / Other MD appointment. Notify  Wound Healing Center of regression in wound condition at [phone]. Please direct any NON-WOUND related issues/requests for orders to patient's Primary Care Physician Wound #6 Left Calcaneus: Continue Home Health Visits - [facility] Nurse may visit PRN to address patient s wound care needs. FACE TO FACE ENCOUNTER: MEDICARE and MEDICAID PATIENTS: I certify that this patient is under my care and that I had a face-to-face encounter that meets the physician face-to-face encounter requirements with this patient on this date. The encounter with the patient was in whole or in part for the following MEDICAL CONDITION: (primary reason for Home Healthcare) MEDICAL NECESSITY: I certify, that based on my findings, NURSING services are a medically necessary home health service. HOME BOUND STATUS: I certify that my clinical findings support that this patient is homebound (i.e., Due to illness or injury, pt requires aid of supportive devices such as crutches, cane, wheelchairs, walkers, the use of special transportation or the assistance of another person to leave their place of residence. There is a normal inability to leave the home and doing so requires considerable and taxing effort. Other absences are for medical reasons / religious services and are infrequent or of short duration when for other reasons). If current dressing causes regression in wound condition, may D/C ordered dressing product/s and apply Normal Saline Moist Dressing daily until next Wound Healing Center / Other MD appointment. Notify Wound Healing Center of regression in wound condition at [phone]. Please direct any NON-WOUND related issues/requests for orders to patient's Primary Care Physician Wound #7 Left,Lateral Malleolus: Continue Home Health Visits - [facility] Nurse may visit PRN to address patient s wound care needs. FACE TO FACE ENCOUNTER: MEDICARE and MEDICAID PATIENTS: I certify that this patient  is under my care and that I had a face-to-face encounter that meets the physician face-to-face encounter requirements with this patient on this date. The encounter with the patient was in whole or in part for the following MEDICAL CONDITION: (primary reason for Home Healthcare) MEDICAL NECESSITY: I certify, that based on my findings, NURSING services are a medically necessary home health service. HOME BOUND STATUS: I certify that my clinical findings support that this patient is homebound (i.e., Due to illness or injury, pt requires aid of supportive devices such as crutches, cane, wheelchairs, walkers, the use of special transportation or the assistance of another person to leave their place of residence. There is a normal inability to leave the home and doing so requires considerable and taxing effort. Other absences are for medical reasons / religious services and are infrequent or of short duration when for other reasons). If current dressing causes regression in wound condition, may D/C ordered dressing product/s and apply Normal Saline Moist Dressing daily until next Wound Healing Center / Other MD appointment. Notify Wound Healing Center of regression in wound condition at [phone]. Please direct any NON-WOUND related issues/requests for orders to patient's Primary Care Physician Wound #8 Left,Posterior Lower Leg: Continue Home Health Visits - [facility] Nurse may visit PRN to address patient s wound care needs. FACE TO FACE ENCOUNTER: MEDICARE and MEDICAID PATIENTS: I certify that this patient is under my care and that I had a face-to-face encounter that meets the physician face-to-face encounter requirements with this patient on this date. The encounter with the patient was in whole or in part for the [name], [name]. ([id number]) following MEDICAL CONDITION: (primary reason for Home Healthcare) MEDICAL NECESSITY: I certify, that based on my findings, NURSING services  are a medically necessary home health service. HOME BOUND STATUS: I certify that my clinical findings support that this patient is homebound (i.e., Due to illness or injury, pt requires aid of supportive devices such as crutches, cane, wheelchairs, walkers, the use of special transportation or the assistance of another person to leave their place of residence. There is a normal inability to leave the home and doing so requires considerable and taxing effort. Other absences are for medical reasons / religious services and are infrequent or of short duration when for other reasons). If current dressing causes regression in wound condition, may D/C ordered dressing product/s and apply Normal Saline Moist Dressing daily until next Wound Healing Center / Other MD appointment. Notify Wound Healing Center of regression in wound condition at [phone]. Please direct any NON-WOUND related issues/requests for orders to patient's Primary Care Physician Radiology ordered were: X-ray, Chest - r/o CHF, r/o aspiration #1 we continued with Iodoflex to all wound beds. #2 this patient has Cheyne-Stokes respirations. Auscultation along suggests bilateralo Pleural effusions. I would wonder about congestive heart failure. I've sent her for a chest x-ray. #3 I doubt the low air loss mattress had anything to do with the presentation however I am doubtful he will use it and therefore the gel overlay will be ordered Electronic Signature(s) Signed: 12/25/2016 9:23:57 AM By: Elliot Gurney, BSN, RN, CWS, Kim RN, BSN Signed: 12/25/2016 5:56:02 PM By: Baltazar Najjar MD Previous Signature: 12/16/2016 4:44:14 PM Version By: Baltazar Najjar MD Entered By: Elliot Gurney, BSN, RN, CWS, Kim on 12/25/2016 09:23:57 Siever, Jake [name] ([id number]) -------------------------------------------------------------------------------- SuperBill Details [name] Date of Service: 12/16/2016 Patient Name: [name] Account Number:  [id number] Medical Record Treating RN: [name] [id number] Number: Other Clinician: [birth year] (83 y.o. 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 G30.1 Alzheimer's disease with late onset Facility Procedures CPT4 Code: [id number] Description: 11042 - DEB SUBQ TISSUE 20 SQ CM/< ICD-10 Description Diagnosis L89.623 Pressure ulcer of left heel, stage 3 L89.613 Pressure ulcer of right heel, stage 3 Modifier: Quantity: 1 Physician Procedures CPT4 Code: [id number] Description: 11042 - WC PHYS SUBQ TISS 20 SQ CM ICD-10 Description Diagnosis L89.623 Pressure ulcer of left heel, stage 3 L89.613 Pressure ulcer of right heel, stage 3 Modifier: Quantity: 1 Electronic Signature(s) Signed: 12/16/2016 4:44:14 PM By: Baltazar Najjar MD Entered By: Baltazar Najjar on 12/16/2016 15:41:39

## 2016-12-17 NOTE — Progress Notes (Signed)
[name], [name] ([id number]) Visit Report for 12/16/2016 Arrival Information Details [name], [name] Date of Service: 12/16/2016 2:30 PM Patient Name: [name] Account Number: [id number] Medical Record Treating RN: [name] [id number] Number: Other Clinician: Date of Birth/Sex: September 19, 1920 (83 y.o. Female) Treating [name] Primary Care [name]: [name] [name]/Extender: [name] [name]: [name] in Treatment: 1 Visit Information History Since Last Visit All ordered tests and consults were No Patient Arrived: Wheel Chair completed: Arrival Time: 14:36 Added or deleted any medications: No Accompanied By: son Any new allergies or adverse No Transfer Assistance: Other reactions: Patient Identification Verified: Yes Had a fall or experienced change in No Secondary Verification Process Yes activities of daily living that may Completed: affect Patient Requires Transmission- No risk of falls: Based Precautions: Signs or symptoms of abuse/neglect No Patient Has Alerts: Yes since last visito Patient Alerts: L ABI non- Hospitalized since last visit: No compressible Has Dressing in Place as Yes R ABI non- Prescribed: compressible Pain Present Now: Unable to Respond Electronic Signature(s) Signed: 12/16/2016 5:13:17 PM By: [name] Entered By: [name] on 12/16/2016 14:37:24 [name], [name] ([id number]) -------------------------------------------------------------------------------- Encounter Discharge Information Details [name] Date of Service: 12/16/2016 2:30 PM Patient Name: [name] Account Number: [id number] Medical Record Treating RN: [name] [id number] Number: Other Clinician: Date of Birth/Sex: [birth year] (83 y.o. Female) Treating [name] Primary Care Caitlynn Ju: [name] Kyjuan Gause/Extender: [name] Shakaya Bhullar: [name] in Treatment: 1 Encounter  Discharge Information Items Discharge Condition: Stable Ambulatory Status: Wheelchair Discharge Destination: Home Transportation: Private Auto Accompanied By: son Schedule Follow-up Appointment: Yes Medication Reconciliation completed and provided to Patient/Care No [name]: Provided on Clinical Summary of Care: 12/16/2016 Form Type Recipient Paper Patient ES Electronic Signature(s) Signed: 12/16/2016 3:45:43 PM By: [name] Entered By: [name] on 12/16/2016 15:45:43 [name] ([id number]) -------------------------------------------------------------------------------- Lower Extremity Assessment Details [name] Date of Service: 12/16/2016 2:30 PM Patient Name: [name] Account Number: [id number] Medical Record Treating RN: [name] [id number] Number: Other Clinician: Date of Birth/Sex: 21-Nov-1920 (83 y.o. Female) Treating [name] Primary Care Ioana Louks: [name] [name]/Extender: [name] [name]: [name] in Treatment: 1 Vascular Assessment Pulses: Dorsalis Pedis Palpable: [Left:Yes] [Right:Yes] Posterior Tibial Extremity colors, hair growth, and conditions: Extremity Color: [Left:Mottled] [Right:Mottled] Temperature of Extremity: [Left:Warm] [Right:Warm] Capillary Refill: [Left:< 3 seconds] [Right:< 3 seconds] Toe Nail Assessment Left: Right: Thick: Yes Yes Discolored: Yes Yes Deformed: Yes Yes Improper Length and Hygiene: Yes Yes Electronic Signature(s) Signed: 12/16/2016 5:13:17 PM By: [name] Entered By: [name] on 12/16/2016 15:01:27 [name], [name] ([id number]) -------------------------------------------------------------------------------- Multi Wound Chart Details [name] Date of Service: 12/16/2016 2:30 PM Patient Name: [name] Account Number: [id number] Medical Record Treating RN: [name] [id number] Number: Other Clinician: Date of  Birth/Sex: Sep 21, 1920 (83 y.o. Female) Treating [name] Primary Care [name]: [name] [name]/Extender: [name] [name]: [name] in Treatment: 1 Vital Signs Height(in): 69 Pulse(bpm): 74 Weight(lbs): 105 Blood Pressure 118/63 (mmHg): Body Mass Index(BMI): 16 Temperature(F): Respiratory Rate 16 (breaths/min): Photos: [5:No Photos] [6:No Photos] [7:No Photos] Wound Location: [5:Right Calcaneus] [6:Left Calcaneus] [7:Left Malleolus - Lateral] Wounding Event: [5:Pressure Injury] [6:Pressure Injury] [7:Pressure Injury] Primary Etiology: [5:Pressure Ulcer] [6:Pressure Ulcer] [7:Pressure Ulcer] Comorbid History: [5:Cataracts, Anemia, Hypertension, History of pressure wounds, Osteoarthritis, Dementia] [6:Cataracts, Anemia, Hypertension, History of pressure wounds, Osteoarthritis, Dementia] [7:Cataracts, Anemia, Hypertension, History of pressure  wounds, Osteoarthritis, Dementia] Date Acquired: [5:10/09/2016] [6:10/09/2016] [7:10/09/2016] [name] of Treatment: [5:1] [6:1] [7:1] Wound Status: [5:Open] [6:Open] [7:Open]  Clustered Wound: [5:Yes] [6:No] [7:No] Clustered Quantity: [5:2] [6:N/A] [7:N/A] Pending Amputation on Yes [6:Yes] [7:Yes] Presentation: Measurements L x W x D 3.5x9.9x0.4 [6:2.8x3.7x0.1] [7:2.1x2.3x0.3] (cm) Area (cm) : [5:27.214] [6:8.137] [7:3.793] Volume (cm) : [5:10.886] [6:0.814] [7:1.138] % Reduction in Area: [5:42.30%] [6:51.80%] [7:-4.50%] % Reduction in Volume: -131.00% [6:51.80%] [7:-56.70%] Classification: [5:Category/Stage III] [6:Category/Stage II] [7:Category/Stage II] Exudate Amount: [5:Large] [6:Large] [7:Large] Exudate Type: [5:Purulent] [6:Purulent] [7:Serosanguineous] Exudate Color: [5:yellow, brown, green] [6:yellow, brown, green] [7:red, brown] Foul Odor After [5:Yes] [6:Yes] [7:Yes] Cleansing: [5:No] [6:No] [7:No] Odor Anticipated [name] to Product Use: Wound Margin: Distinct, outline attached Distinct, outline  attached Distinct, outline attached Granulation Amount: Small (1-33%) None Present (0%) Small (1-33%) Granulation Quality: Red N/A Red Necrotic Amount: Large (67-100%) Large (67-100%) Large (67-100%) Necrotic Tissue: Eschar, Adherent Slough Eschar Adherent Slough Epithelialization: None None None Debridement: N/A Debridement ([id number] N/A [id number]) Pre-procedure N/A 15:20 N/A Verification/Time Out Taken: Pain Control: N/A Lidocaine 4% Topical N/A Solution Tissue Debrided: N/A Fibrin/Slough, Exudates, N/A Subcutaneous Level: N/A Skin/Subcutaneous N/A Tissue Debridement Area (sq N/A 10.36 N/A cm): Instrument: N/A Blade, Forceps N/A Bleeding: N/A Minimum N/A Hemostasis Achieved: N/A Pressure N/A Procedural Pain: N/A 0 N/A Post Procedural Pain: N/A 0 N/A Debridement Treatment N/A Procedure was tolerated N/A Response: well Post Debridement N/A 2.8x3.7x0.2 N/A Measurements L x W x D (cm) Post Debridement N/A 1.627 N/A Volume: (cm) Post Debridement N/A Category/Stage II N/A Stage: Periwound Skin Texture: No Abnormalities Noted No Abnormalities Noted No Abnormalities Noted Periwound Skin No Abnormalities Noted No Abnormalities Noted No Abnormalities Noted Moisture: Periwound Skin Color: Erythema: Yes Erythema: Yes Erythema: Yes Ecchymosis: No Erythema Location: Circumferential Circumferential Circumferential Temperature: No Abnormality No Abnormality No Abnormality Tenderness on Yes Yes Yes Palpation: Wound Preparation: Ulcer Cleansing: Ulcer Cleansing: Ulcer Cleansing: Rinsed/Irrigated with Rinsed/Irrigated with Rinsed/Irrigated with Saline Saline Saline Topical Anesthetic Topical Anesthetic Topical Anesthetic [name], [name] ([id number]) Applied: Other: lidocaine Applied: Other: lidocaine Applied: Other: lidocaine 4% 4% 4% Procedures Performed: N/A Debridement N/A Wound Number: 8 N/A N/A Photos: No Photos N/A N/A Wound Location: Left Lower Leg - Posterior N/A  N/A Wounding Event: Pressure Injury N/A N/A Primary Etiology: Pressure Ulcer N/A N/A Comorbid History: Cataracts, Anemia, N/A N/A Hypertension, History of pressure wounds, Osteoarthritis, Dementia Date Acquired: 10/09/2016 N/A N/A [name] of Treatment: 1 N/A N/A Wound Status: Open N/A N/A Clustered Wound: No N/A N/A Clustered Quantity: N/A N/A N/A Pending Amputation on Yes N/A N/A Presentation: Measurements L x W x D 4.4x3.5x0.8 N/A N/A (cm) Area (cm) : 12.095 N/A N/A Volume (cm) : 9.676 N/A N/A % Reduction in Area: -0.60% N/A N/A % Reduction in Volume: -34.20% N/A N/A Classification: Category/Stage III N/A N/A Exudate Amount: Large N/A N/A Exudate Type: Purulent N/A N/A Exudate Color: yellow, brown, green N/A N/A Foul Odor After Yes N/A N/A Cleansing: Odor Anticipated [name] to No N/A N/A Product Use: Wound Margin: Distinct, outline attached N/A N/A Granulation Amount: Small (1-33%) N/A N/A Granulation Quality: Red N/A N/A Necrotic Amount: Large (67-100%) N/A N/A Necrotic Tissue: Eschar, Adherent Slough N/A N/A Epithelialization: None N/A N/A Debridement: N/A N/A N/A Pain Control: N/A N/A N/A Tissue Debrided: N/A N/A N/A Level: N/A N/A N/A Debridement Area (sq N/A N/A N/A cm): Instrument: N/A N/A N/A Bleeding: N/A N/A N/A Hemostasis Achieved: N/A N/A N/A Budzinski, Chace L. ([id number]) Procedural Pain: N/A N/A N/A Post Procedural Pain: N/A N/A N/A Debridement Treatment N/A N/A N/A Response: Post Debridement N/A N/A N/A Measurements L x W x D (cm) Post Debridement N/A  N/A N/A Volume: (cm) Post Debridement N/A N/A N/A Stage: Periwound Skin Texture: No Abnormalities Noted N/A N/A Periwound Skin No Abnormalities Noted N/A N/A Moisture: Periwound Skin Color: Erythema: Yes N/A N/A Erythema Location: Circumferential N/A N/A Temperature: No Abnormality N/A N/A Tenderness on Yes N/A N/A Palpation: Wound Preparation: Ulcer Cleansing: N/A N/A Rinsed/Irrigated  with Saline Topical Anesthetic Applied: Other: lidocaine 4% Procedures Performed: N/A N/A N/A Treatment Notes Electronic Signature(s) Signed: 12/16/2016 4:44:14 PM By: Baltazar Najjarobson, [name] MD Entered By: Baltazar Najjarobson, [name] on 12/16/2016 15:29:25 Skoda, [name] SeatsELIZABETH L. ([id number]) -------------------------------------------------------------------------------- Multi-Disciplinary Care Plan Details [name] BabeSUMMERS, [name] Date of Service: 12/16/2016 2:30 PM Patient Name: [name] Account Number: [id number] Medical Record Treating RN: [name] Haggisinkerton, Debi [id number] Number: Other Clinician: Date of Birth/Sex: 1921/02/12 (83 y.o. Female) Treating [name] Primary Care Ernst Cumpston: [name] HasteBADGER, [name] Eluzer Howdeshell/Extender: [name] Leonetta Mcgivern: [name] DalesBADGER, [name] [name] in Treatment: 1 Active Inactive ` Abuse / Safety / Falls / Self Care Management Nursing Diagnoses: Potential for falls Goals: Patient will not experience any injury related to falls Date Initiated: 12/09/2016 Target Resolution Date: 02/20/2017 Goal Status: Active Interventions: Assess fall risk on admission and as needed Notes: ` Nutrition Nursing Diagnoses: Imbalanced nutrition Potential for alteratiion in Nutrition/Potential for imbalanced nutrition Goals: Patient/caregiver agrees to and verbalizes understanding of need to use nutritional supplements and/or vitamins as prescribed Date Initiated: 12/09/2016 Target Resolution Date: 03/27/2017 Goal Status: Active Interventions: Assess patient nutrition upon admission and as needed per policy Notes: ` Orientation to the Wound Care Program [name] RudSUMMERS, [name] ([id number]) Nursing Diagnoses: Knowledge deficit related to the wound healing center program Goals: Patient/caregiver will verbalize understanding of the Wound Healing Center Program Date Initiated: 12/09/2016 Target Resolution Date: 12/26/2016 Goal Status: Active Interventions: Provide education on orientation to the wound  center Notes: ` Pain, Acute or Chronic Nursing Diagnoses: Pain, acute or chronic: actual or potential Potential alteration in comfort, pain Goals: Patient/caregiver will verbalize adequate pain control between visits Date Initiated: 12/09/2016 Target Resolution Date: 03/27/2017 Goal Status: Active Interventions: Assess comfort goal upon admission Complete pain assessment as per visit requirements Notes: ` Pressure Nursing Diagnoses: Knowledge deficit related to causes and risk factors for pressure ulcer development Knowledge deficit related to management of pressures ulcers Potential for impaired tissue integrity related to pressure, friction, moisture, and shear Goals: Patient/caregiver will verbalize risk factors for pressure ulcer development Date Initiated: 12/09/2016 Target Resolution Date: 03/27/2017 Goal Status: Active Interventions: Assess: immobility, friction, shearing, incontinence upon admission and as needed [name] RudSUMMERS, [name] ([id number]) Assess offloading mechanisms upon admission and as needed Notes: ` Wound/Skin Impairment Nursing Diagnoses: Impaired tissue integrity Knowledge deficit related to ulceration/compromised skin integrity Goals: Ulcer/skin breakdown will have a volume reduction of 80% by week 12 Date Initiated: 12/09/2016 Target Resolution Date: 03/20/2017 Goal Status: Active Interventions: Assess patient/caregiver ability to perform ulcer/skin care regimen upon admission and as needed Notes: Electronic Signature(s) Signed: 12/16/2016 5:13:17 PM By: [name] MullingPinkerton, Debra Entered By: [name] MullingPinkerton, Debra on 12/16/2016 15:01:34 Vinton, [name] SeatsELIZABETH L. ([id number]) -------------------------------------------------------------------------------- Pain Assessment Details [name] BabeSUMMERS, Stayce Date of Service: 12/16/2016 2:30 PM Patient Name: [name] Account Number: [id number] Medical Record Treating RN: [name] Haggisinkerton, Debi [id number] Number: Other Clinician: Date  of Birth/Sex: 1921/02/12 (83 y.o. Female) Treating [name] Primary Care Elene Downum: [name] HasteBADGER, [name] Shadrack Brummitt/Extender: [name] Lennon Boutwell: [name] DalesBADGER, [name] [name] in Treatment: 1 Active Problems Location of Pain Severity and Description of Pain Patient Has Paino Patient Unable to Respond Site Locations Pain Management and Medication Current Pain Management: Electronic Signature(s) Signed: 12/16/2016 5:13:17 PM By: [name] MullingPinkerton, Debra Entered By: Ashok CordiaPinkerton,  Debra on 12/16/2016 14:37:32 [name], [name] ([id number]) -------------------------------------------------------------------------------- Patient/Caregiver Education Details [name] Date of Service: 12/16/2016 2:30 PM Patient Name: [name] Account Number: [id number] Medical Record Treating RN: [name] [id number] Number: Other Clinician: [birth year] (83 y.o. Treating [name] Date of Birth/Gender: Female) Physician/Extender: [name] Primary Care Physician: [name] in Treatment: 1 [name] Physician: [name] Education Assessment Education Provided To: Patient Education Topics Provided Wound/Skin Impairment: Handouts: Other: change dressing as ordered Methods: Demonstration, Explain/Verbal Responses: State content correctly Electronic Signature(s) Signed: 12/16/2016 5:13:17 PM By: [name] Entered By: [name] on 12/16/2016 15:13:40 [name], [name] ([id number]) -------------------------------------------------------------------------------- Wound Assessment Details [name] Date of Service: 12/16/2016 2:30 PM Patient Name: [name] Account Number: [id number] Medical Record Treating RN: [name] [id number] Number: Other Clinician: Date of Birth/Sex: [birth year] (83 y.o. Female) Treating [name] Primary Care Ashlie Mcmenamy: [name] Ilean Spradlin/Extender: [name] Farryn Linares: [name] in Treatment: 1 Wound  Status Wound Number: 5 Primary Pressure Ulcer Etiology: Wound Location: Right Calcaneus Wound Open Wounding Event: Pressure Injury Status: Date Acquired: 10/09/2016 Comorbid Cataracts, Anemia, Hypertension, [name] Of Treatment: 1 History: History of pressure wounds, Clustered Wound: Yes Osteoarthritis, Dementia Pending Amputation On Presentation Photos Photo Uploaded By: [name] on 12/16/2016 17:10:08 Wound Measurements Length: (cm) 3.5 % Reduction in Width: (cm) 9.9 % Reduction in Depth: (cm) 0.4 Epithelializat Clustered Quantity: 2 Tunneling: Area: (cm) 27.214 Undermining: Volume: (cm) 10.886 Area: 42.3% Volume: -131% ion: None No No Wound Description Classification: Category/Stage III Foul Odor Afte Wound Margin: Distinct, outline attached [name] to Product Exudate Amount: Large Slough/Fibrino Exudate Type: Purulent Exudate Color: yellow, brown, green r Cleansing: Yes Use: No Yes Wound Bed Granulation Amount: Small (1-33%) Gunawan, Zyrah L. ([id number]) Granulation Quality: Red Necrotic Amount: Large (67-100%) Necrotic Quality: Eschar, Adherent Slough Periwound Skin Texture Texture Color No Abnormalities Noted: No No Abnormalities Noted: No Ecchymosis: No Moisture Erythema: Yes No Abnormalities Noted: No Erythema Location: Circumferential Temperature / Pain Temperature: No Abnormality Tenderness on Palpation: Yes Wound Preparation Ulcer Cleansing: Rinsed/Irrigated with Saline Topical Anesthetic Applied: Other: lidocaine 4%, Treatment Notes Wound #5 (Right Calcaneus) 1. Cleansed with: Clean wound with Normal Saline Cleanse wound with antibacterial soap and water 2. Anesthetic Topical Lidocaine 4% cream to wound bed prior to debridement 4. Dressing Applied: Iodoflex 5. Secondary Dressing Applied Dry Gauze 7. Secured with Tape Notes heel, cups, kerlix, coban Electronic Signature(s) Signed: 12/16/2016 5:13:17 PM By: [name] Entered By: [name] on 12/16/2016 14:55:51 [name], [name] ([id number]) -------------------------------------------------------------------------------- Wound Assessment Details [name] Date of Service: 12/16/2016 2:30 PM Patient Name: [name] Account Number: [id number] Medical Record Treating RN: [name] [id number] Number: Other Clinician: Date of Birth/Sex: [birth year] (83 y.o. Female) Treating [name] Primary Care Unita Detamore: [name] Saphira Lahmann/Extender: [name] Sidrah Harden: [name] in Treatment: 1 Wound Status Wound Number: 6 Primary Pressure Ulcer Etiology: Wound Location: Left Calcaneus Wound Open Wounding Event: Pressure Injury Status: Date Acquired: 10/09/2016 Comorbid Cataracts, Anemia, Hypertension, [name] Of Treatment: 1 History: History of pressure wounds, Clustered Wound: No Osteoarthritis, Dementia Pending Amputation On Presentation Photos Photo Uploaded By: [name] on 12/16/2016 17:10:46 Wound Measurements Length: (cm) 2.8 Width: (cm) 3.7 Depth: (cm) 0.1 Area: (cm) 8.137 Volume: (cm) 0.814 % Reduction in Area: 51.8% % Reduction in Volume: 51.8% Epithelialization: None Tunneling: No Undermining: No Wound Description Classification: Category/Stage II Foul Odor Afte Wound Margin: Distinct, outline attached [name] to Product Exudate Amount: Large Slough/Fibrino Exudate Type: Purulent Exudate Color: yellow, brown, green r Cleansing:  Yes Use: No Yes Wound Bed Granulation Amount: None Present (0%) Necrotic Amount: Large (67-100%) Goonan, Aalani L. ([id number]) Necrotic Quality: Eschar Periwound Skin Texture Texture Color No Abnormalities Noted: No No Abnormalities Noted: No Erythema: Yes Moisture Erythema Location: Circumferential No Abnormalities Noted: No Temperature / Pain Temperature: No Abnormality Tenderness on Palpation: Yes Wound Preparation Ulcer Cleansing:  Rinsed/Irrigated with Saline Topical Anesthetic Applied: Other: lidocaine 4%, Treatment Notes Wound #6 (Left Calcaneus) 1. Cleansed with: Clean wound with Normal Saline Cleanse wound with antibacterial soap and water 2. Anesthetic Topical Lidocaine 4% cream to wound bed prior to debridement 4. Dressing Applied: Iodoflex 5. Secondary Dressing Applied Dry Gauze 7. Secured with Tape Notes heel, cups, kerlix, coban Electronic Signature(s) Signed: 12/16/2016 5:13:17 PM By: [name] Entered By: [name] on 12/16/2016 15:23:12 [name], [name] ([id number]) -------------------------------------------------------------------------------- Wound Assessment Details [name] Date of Service: 12/16/2016 2:30 PM Patient Name: [name] Account Number: [id number] Medical Record Treating RN: [name] [id number] Number: Other Clinician: Date of Birth/Sex: 09/22/1920 (83 y.o. Female) Treating [name] Primary Care Valera Vallas: [name] Cambrea Kirt/Extender: [name] Nisa Decaire: [name] in Treatment: 1 Wound Status Wound Number: 7 Primary Pressure Ulcer Etiology: Wound Location: Left Malleolus - Lateral Wound Open Wounding Event: Pressure Injury Status: Date Acquired: 10/09/2016 Comorbid Cataracts, Anemia, Hypertension, [name] Of Treatment: 1 History: History of pressure wounds, Clustered Wound: No Osteoarthritis, Dementia Pending Amputation On Presentation Photos Photo Uploaded By: [name] on 12/16/2016 17:10:46 Wound Measurements Length: (cm) 2.1 Width: (cm) 2.3 Depth: (cm) 0.3 Area: (cm) 3.793 Volume: (cm) 1.138 % Reduction in Area: -4.5% % Reduction in Volume: -56.7% Epithelialization: None Tunneling: No Undermining: No Wound Description Classification: Category/Stage II Foul Odor Aft Wound Margin: Distinct, outline attached [name] to Produc Exudate Amount: Large Slough/Fibrin Exudate Type:  Serosanguineous Exudate Color: red, brown er Cleansing: Yes t Use: No o Yes Wound Bed Granulation Amount: Small (1-33%) Granulation Quality: Red Wirt, Sherryann L. ([id number]) Necrotic Amount: Large (67-100%) Necrotic Quality: Adherent Slough Periwound Skin Texture Texture Color No Abnormalities Noted: No No Abnormalities Noted: No Erythema: Yes Moisture Erythema Location: Circumferential No Abnormalities Noted: No Temperature / Pain Temperature: No Abnormality Tenderness on Palpation: Yes Wound Preparation Ulcer Cleansing: Rinsed/Irrigated with Saline Topical Anesthetic Applied: Other: lidocaine 4%, Treatment Notes Wound #7 (Left, Lateral Malleolus) 1. Cleansed with: Clean wound with Normal Saline Cleanse wound with antibacterial soap and water 2. Anesthetic Topical Lidocaine 4% cream to wound bed prior to debridement 4. Dressing Applied: Iodoflex 5. Secondary Dressing Applied Dry Gauze Foam 7. Secured with Tape Notes kerlix, coban Electronic Signature(s) Signed: 12/16/2016 5:13:17 PM By: [name] Entered By: [name] on 12/16/2016 14:56:38 [place], [name] ([id number]) -------------------------------------------------------------------------------- Wound Assessment Details [name] Date of Service: 12/16/2016 2:30 PM Patient Name: [name] Account Number: [id number] Medical Record Treating RN: [name] [id number] Number: Other Clinician: Date of Birth/Sex: 1921/01/22 (83 y.o. Female) Treating [name] Primary Care Righteous Claiborne: [name] Ahleah Simko/Extender: [name] Navid Lenzen: [name] in Treatment: 1 Wound Status Wound Number: 8 Primary Pressure Ulcer Etiology: Wound Location: Left Lower Leg - Posterior Wound Open Wounding Event: Pressure Injury Status: Date Acquired: 10/09/2016 Comorbid Cataracts, Anemia, Hypertension, [name] Of Treatment: 1 History: History of pressure  wounds, Clustered Wound: No Osteoarthritis, Dementia Pending Amputation On Presentation Photos Photo Uploaded By: [name] on 12/16/2016 17:11:21 Wound Measurements Length: (cm) 4.4 Width: (cm) 3.5 Depth: (cm) 0.8 Area: (cm) 12.095 Volume: (cm) 9.676 % Reduction in Area: -0.6% % Reduction in Volume: -34.2% Epithelialization: None  Tunneling: No Undermining: No Wound Description Classification: Category/Stage III Foul Odor Aft Wound Margin: Distinct, outline attached [name] to Produc Exudate Amount: Large Slough/Fibrin Exudate Type: Purulent Exudate Color: yellow, brown, green er Cleansing: Yes t Use: No o Yes Wound Bed Granulation Amount: Small (1-33%) Granulation Quality: Red Narasimhan, Teckla L. ([id number]) Necrotic Amount: Large (67-100%) Necrotic Quality: Eschar, Adherent Slough Periwound Skin Texture Texture Color No Abnormalities Noted: No No Abnormalities Noted: No Erythema: Yes Moisture Erythema Location: Circumferential No Abnormalities Noted: No Temperature / Pain Temperature: No Abnormality Tenderness on Palpation: Yes Wound Preparation Ulcer Cleansing: Rinsed/Irrigated with Saline Topical Anesthetic Applied: Other: lidocaine 4%, Treatment Notes Wound #8 (Left, Posterior Lower Leg) 1. Cleansed with: Clean wound with Normal Saline Cleanse wound with antibacterial soap and water 2. Anesthetic Topical Lidocaine 4% cream to wound bed prior to debridement 4. Dressing Applied: Iodoflex 5. Secondary Dressing Applied Dry Gauze Foam 7. Secured with Tape Notes kerlix, coban Electronic Signature(s) Signed: 12/16/2016 5:13:17 PM By: [name] Entered By: [name] on 12/16/2016 14:56:55 Imburgia, [name] ([id number]) -------------------------------------------------------------------------------- Vitals Details [name] Date of Service: 12/16/2016 2:30 PM Patient Name: [name] Account Number: [id number] Medical  Record Treating RN: [name] [id number] Number: Other Clinician: Date of Birth/Sex: 21-Jan-1921 (83 y.o. Female) Treating [name] Primary Care Yasseen Salls: [name] Toivo Bordon/Extender: [name] Orianna Biskup: [name] in Treatment: 1 Vital Signs Time Taken: 14:37 Pulse (bpm): 74 Height (in): 69 Respiratory Rate (breaths/min): 16 Weight (lbs): 105 Blood Pressure (mmHg): 118/63 Body Mass Index (BMI): 15.5 Reference Range: 80 - 120 mg / dl Electronic Signature(s) Signed: 12/16/2016 5:13:17 PM By: [name] Entered By: [name] on 12/16/2016 14:40:42

## 2016-12-23 ENCOUNTER — Encounter: Payer: Medicare Other | Attending: Internal Medicine | Admitting: Internal Medicine

## 2016-12-23 DIAGNOSIS — L89523 Pressure ulcer of left ankle, stage 3: Secondary | ICD-10-CM | POA: Insufficient documentation

## 2016-12-23 DIAGNOSIS — G301 Alzheimer's disease with late onset: Secondary | ICD-10-CM | POA: Insufficient documentation

## 2016-12-23 DIAGNOSIS — I10 Essential (primary) hypertension: Secondary | ICD-10-CM | POA: Diagnosis not present

## 2016-12-23 DIAGNOSIS — L89613 Pressure ulcer of right heel, stage 3: Secondary | ICD-10-CM | POA: Diagnosis not present

## 2016-12-23 DIAGNOSIS — D649 Anemia, unspecified: Secondary | ICD-10-CM | POA: Diagnosis not present

## 2016-12-23 DIAGNOSIS — L89623 Pressure ulcer of left heel, stage 3: Secondary | ICD-10-CM | POA: Insufficient documentation

## 2016-12-23 DIAGNOSIS — M1990 Unspecified osteoarthritis, unspecified site: Secondary | ICD-10-CM | POA: Insufficient documentation

## 2016-12-23 DIAGNOSIS — L97223 Non-pressure chronic ulcer of left calf with necrosis of muscle: Secondary | ICD-10-CM | POA: Diagnosis not present

## 2016-12-24 NOTE — Progress Notes (Signed)
[name], [name] ([id number]) Visit Report for 12/23/2016 Chief Complaint Document Details [name] Date of Service: 12/23/2016 3:30 PM Patient Name: [name] Account Number: [id number] Medical Record Treating RN: [name] [id number] Number: Other Clinician: 1920/09/14 (83 y.o. Treating Nathaneil Feagans Date of Birth/Sex: Female) Provider/Extender: G Primary Care Provider: [name] Referring Provider: [name] in Treatment: 2 Information Obtained from: Patient Chief Complaint Follow-up regarding right lateral calf wounds and right heel pressure injury Electronic Signature(s) Signed: 12/23/2016 4:31:20 PM By: Baltazar Najjar MD Entered By: Baltazar Najjar on 12/23/2016 16:16:51 Bunyard, [name] ([id number]) -------------------------------------------------------------------------------- HPI Details [name] Date of Service: 12/23/2016 3:30 PM Patient Name: [name] Account Number: [id number] Medical Record Treating RN: [name] [id number] Number: Other Clinician: 1920-07-27 (83 y.o. Treating Aryelle Figg Date of Birth/Sex: Female) Provider/Extender: G Primary Care Provider: [name] Referring Provider: [name] in Treatment: 2 History of Present Illness HPI Description: 03/24/16; this is an elderly 83 year old woman with advanced dementia who was hospitalized from 5/8 through 5/13. At that point she had Proteus sepsis felt to be secondary to a UTI the. Acute kidney injury and delirium. Her son who is her primary caregiver says she came home from the hospital with wounds on her right lower extremity and apparently her right buttock as well. There is no mention on the hospital discharge summary of problems. She has been having [facility] and have been using silver alginate based dressings. Apparently the area on the right buttock is healing and the son stated we did not need to become involved  with that. In any case the patient has for wounds to on the right heel and 2 on the posterior lateral right calf, the latter of which is not a usual pressure area however that is the history. She is apparently eating and drinking fairly well. Takes ensure well. She does not have any pressure-relief surface at home. I have reviewed her lab work from the hospital. Her admission albumin was 3.4 on 5/8 04/01/16; patient arrives with wounds looking much the same as last week. She has an extensive pressure area over her right heel and to small but deep wounds on the lateral aspect of her right lower leg. These wounds are not connected. Although there are advertised his pressure ulcers these 2 wounds are not usual for pressure ulcerations. We have not looked at the pressure ulceration on her buttock at the request of the patient's son who is the primary caregiver 04/08/16; wounds are stable to improved this week. Culture I did of the lower wound on her right lateral leg grew methicillin sensitive staph aureus she is on Septra. We are using silver alginate to all the wounds. 04/15/16; the 2 small areas on the lateral aspect of this lady's right leg continued to improve however the heel is once again covered with callus, residual alginate, acrotic material all of which requires a difficult debridement. There continues to be purulent material under this surface. This is in spite of a week of Septra that I gave him for MSSA 04/22/16 Patient today presents for follow-up evaluation. She is seen with her son in the office at this point in time she does have Alzheimer's. The good news is her wounds appeared to be somewhat smaller with current therapy. At this point in time the infection which was cultured previously appears to have improved in regard to the right heel wound. There is no purulent drainage at this point in time and a good portion  of the wound actually is eschar covered and dry with a medial portion  actually open to granulation with very little slough covering. She really has no significant discomfort with palpation manipulation of the wound at this point in time. 04/28/16 patient presents today for follow-up evaluation concerning her right lateral calf wound as well as right heel wound. she has not really been complaining this for as pain is concerned according to her son seen with her in the office at this point in time today. She seems to show no interval signs or symptoms of infection overall has been tolerating the dressing changes well. She does have Alzheimer's therefore is not able to rate her pain although she does respond with an affirmative that she hurts when I press over the [name], [name] ([id number]) heel region. 05/05/16; the area on the lateral aspect of her right calf is healed. The right heel as 2 small spots that are still open and very close to resolving as well using Aquacel Ag under border foam, 05/20/16; the area on the lateral aspect of her right calf remains closed. The 2 small areas on her right heel are also closed. READMISSION 12/09/16; this is a 83 year old woman with advanced dementia that we cared for in the clinic from September to the beginning of November 2017. At that point she had a right heel, right lateral calf and right buttock wound all felt to be secondary to pressure. These eventually closed over. The patient's son who is the primary caregiver states that over the last 2 months she is developed bilateral lower extremity pressure ulcers. [facility] and they have been applying Medihoney and an alginate. These were apparently all pressure ulcers today including area on the left posterior calf. Apparently they were keeping her leg elevated on the pillow which contributed to this. The patient has advanced dementia, is nonambulatory and not able to move herself in bed. There is apparently not a nutritional issue. She has  [facility]. 12/16/16; patient was readmitted to the clinic last week with multiple wounds including the medial and lateral aspects of the left calcaneus left posterior calf right calcaneus. We've been using Iodoflex, dressing change by home health. Her son also reports that she has had trouble breathing which she attributes to the low air loss mattress we have for pressure relief. He states that when he took her out of bed and put her in her chair breathing improved. As of Monday he took the low air loss mattress off the bed and states that she is able to go to bed without shortness of breath. His description sounded a lot like orthopnea. He is asking [place] to order a gel mattress overlay. According to her son she does not aspirate at least not frequently 12/23/16; the patient is reviewed today for multiple wounds on her lower extremities. We have been using Iodoflex dressing change by home health. Last week the patient was Cheyne-Stokes in with an x-ray that look like heart failure with small bilateral pleural effusions. I gave her Lasix and some potassium. She saw her primary physician earlier this week who did not add anything to this. Her son states she is doing better she has not had any of the orthopneic episodes since Monday Electronic Signature(s) Signed: 12/23/2016 4:31:20 PM By: Baltazar Najjar MD Entered By: Baltazar Najjar on 12/23/2016 16:18:05 Schermer, [name] ([id number]) -------------------------------------------------------------------------------- Physical Exam Details [name] Date of Service: 12/23/2016 3:30 PM Patient Name: [name]  Account Number: [id number] Medical Record Treating RN: [name] [id number] Number: Other Clinician: 01-09-1921 (83 y.o. Treating Kazue Cerro Date of Birth/Sex: Female) Provider/Extender: G Primary Care Provider: [name] Referring Provider: [name] Weeks in Treatment: 2 Constitutional Patient  is hypertensive.. Pulse regular and within target range for patient.Marland Kitchen Respiratory effort pronounced and rate above normal.. Temperature is normal and within the target range for the patient.Marland Kitchen Appears to be uncomfortable. Especially when we go near her legs.Marland Kitchen Respiratory Above normal respiratory effort noted. Respiratory rate elveaated. As opposed the last week no Cheyne- Stokes respirations. Crackles in the left lower lobe. Cardiovascular Faint dorsalis pedis pulses bilaterally. No edema. Gastrointestinal (GI) Abdomen is soft and non-distended without masses or tenderness. Bowel sounds active in all quadrants.. No liver or spleen enlargement or tenderness.Marland Kitchen Psychiatric No major difference from what I'm used to seeing. Severe dementia. Notes Wound exam; multiple deep punched out wounds in this woman's bilateral lower extremities. Her arterial supply/insufficiency might be playing a role here however I can't imagine doing anything in terms of arterial investigations. oOn the right leg she has an area on the right medial heel that is still necrotic, reasonably clean wound on the right lateral malleolus and in improving but deep wound on the right posterior calf. oOn the left leg she has deep wounds on the left lateral calcaneus and left medial calcaneus. In's small new wound on the left posterior calf. Electronic Signature(s) Signed: 12/23/2016 4:31:20 PM By: Baltazar Najjar MD Entered By: Baltazar Najjar on 12/23/2016 16:23:36 [name], [name] ([id number]) -------------------------------------------------------------------------------- Physician Orders Details [name] Date of Service: 12/23/2016 3:30 PM Patient Name: [name] Account Number: [id number] Medical Record Treating RN: [name] [id number] Number: Other Clinician: 09-25-1920 (83 y.o. Treating Caelen Higinbotham Date of Birth/Sex: Female) Provider/Extender: G Primary Care Provider: [name] Referring  Provider: [name] in Treatment: 2 Verbal / Phone Orders: Yes Clinician: Pinkerton, Debi Read Back and Verified: Yes Diagnosis Coding Wound Cleansing Wound #5 Right Calcaneus o Clean wound with Normal Saline. o Cleanse wound with mild soap and water Wound #6 Left Calcaneus o Clean wound with Normal Saline. o Cleanse wound with mild soap and water Wound #7 Left,Lateral Malleolus o Clean wound with Normal Saline. o Cleanse wound with mild soap and water Wound #8 Left,Posterior Lower Leg o Clean wound with Normal Saline. o Cleanse wound with mild soap and water Wound #9 Right,Lateral Lower Leg o Clean wound with Normal Saline. o Cleanse wound with mild soap and water Anesthetic Wound #5 Right Calcaneus o Topical Lidocaine 4% cream applied to wound bed prior to debridement - for clinic use Wound #6 Left Calcaneus o Topical Lidocaine 4% cream applied to wound bed prior to debridement Wound #7 Left,Lateral Malleolus o Topical Lidocaine 4% cream applied to wound bed prior to debridement - for clinic use Wound #8 Left,Posterior Lower Leg o Topical Lidocaine 4% cream applied to wound bed prior to debridement - for clinic use Wound #9 Right,Lateral Lower Leg [name], BARTLE. ([id number]) o Topical Lidocaine 4% cream applied to wound bed prior to debridement - for clinic use Primary Wound Dressing Wound #5 Right Calcaneus o Iodoflex Wound #6 Left Calcaneus o Iodoflex Wound #7 Left,Lateral Malleolus o Iodoflex Wound #8 Left,Posterior Lower Leg o Iodoflex Wound #9 Right,Lateral Lower Leg o Iodoflex Secondary Dressing Wound #5 Right Calcaneus o Dry Gauze o Other - Allevyn heel cup Wound #6 Left Calcaneus o Dry Gauze o Other - Allevyn heel cup Wound #7 Left,Lateral Malleolus o  ABD pad o Dry Gauze o Foam - non adhesive foam Wound #8 Left,Posterior Lower Leg o ABD pad o Dry Gauze o Foam - non  adhesive foam Wound #9 Right,Lateral Lower Leg o ABD pad o Dry Gauze o Foam - non adhesive foam Dressing Change Frequency Wound #5 Right Calcaneus o Change Dressing Monday, Wednesday, Friday - HHRN to also change wraps and dressing on 12/30/16 next week Wound #6 Left Calcaneus [name], [name] ([id number]) o Change Dressing Monday, Wednesday, Friday - HHRN to also change wraps and dressing on 12/30/16 next week Wound #7 Left,Lateral Malleolus o Change Dressing Monday, Wednesday, Friday - HHRN to also change wraps and dressing on 12/30/16 next week Wound #8 Left,Posterior Lower Leg o Change Dressing Monday, Wednesday, Friday - HHRN to also change wraps and dressing on 12/30/16 next week Wound #9 Right,Lateral Lower Leg o Change Dressing Monday, Wednesday, Friday - HHRN to also change wraps and dressing on 12/30/16 next week Follow-up Appointments Wound #5 Right Calcaneus o Return Appointment in 2 weeks. Wound #6 Left Calcaneus o Return Appointment in 2 weeks. Wound #7 Left,Lateral Malleolus o Return Appointment in 2 weeks. Wound #8 Left,Posterior Lower Leg o Return Appointment in 2 weeks. Wound #9 Right,Lateral Lower Leg o Return Appointment in 2 weeks. Edema Control Wound #5 Right Calcaneus o Kerlix and Coban - Bilateral - lightly wrapped from toes to 3 cm below the knee Wound #6 Left Calcaneus o Kerlix and Coban - Bilateral - lightly wrapped from toes to 3 cm below the knee Wound #7 Left,Lateral Malleolus o Kerlix and Coban - Bilateral - lightly wrapped from toes to 3 cm below the knee Wound #8 Left,Posterior Lower Leg o Kerlix and Coban - Bilateral - lightly wrapped from toes to 3 cm below the knee Wound #9 Right,Lateral Lower Leg o Kerlix and Coban - Bilateral - lightly wrapped from toes to 3 cm below the knee Off-Loading [name], [name] ([id number]) Wound #9 Right,Lateral Lower Leg o Mattress o Turn and reposition every 2  hours Additional Orders / Instructions Wound #5 Right Calcaneus o Increase protein intake. o Other: - Please add vitamin A, vitamin C and zinc supplements to your diet Wound #6 Left Calcaneus o Increase protein intake. o Other: - Please add vitamin A, vitamin C and zinc supplements to your diet Wound #7 Left,Lateral Malleolus o Increase protein intake. o Other: - Please add vitamin A, vitamin C and zinc supplements to your diet Wound #8 Left,Posterior Lower Leg o Increase protein intake. o Other: - Please add vitamin A, vitamin C and zinc supplements to your diet Wound #9 Right,Lateral Lower Leg o Increase protein intake. o Other: - Please add vitamin A, vitamin C and zinc supplements to your diet Home Health Wound #5 Right Calcaneus o [facility] - [facility] o Home Health Nurse may visit PRN to address patientos wound care needs. o FACE TO FACE ENCOUNTER: MEDICARE and MEDICAID PATIENTS: I certify that this patient is under my care and that I had a face-to-face encounter that meets the physician face-to-face encounter requirements with this patient on this date. The encounter with the patient was in whole or in part for the following MEDICAL CONDITION: (primary reason for Home Healthcare) MEDICAL NECESSITY: I certify, that based on my findings, NURSING services are a medically necessary home health service. HOME BOUND STATUS: I certify that my clinical findings support that this patient is homebound (i.e., Due to illness or injury, pt requires aid of supportive devices such  as crutches, cane, wheelchairs, walkers, the use of special transportation or the assistance of another person to leave their place of residence. There is a normal inability to leave the home and doing so requires considerable and taxing effort. Other absences are for medical reasons / religious services and are infrequent or of short duration when for other reasons). o If  current dressing causes regression in wound condition, may D/C ordered dressing product/s and apply Normal Saline Moist Dressing daily until next Wound Healing Center / Other MD appointment. Notify Wound Healing Center of regression in wound condition at [phone]. o Please direct any NON-WOUND related issues/requests for orders to patient's Primary Care Physician Wound #6 Left Calcaneus o Continue Home Health Visits - [name], [name] ([id number]) o Home Health Nurse may visit PRN to address patientos wound care needs. o FACE TO FACE ENCOUNTER: MEDICARE and MEDICAID PATIENTS: I certify that this patient is under my care and that I had a face-to-face encounter that meets the physician face-to-face encounter requirements with this patient on this date. The encounter with the patient was in whole or in part for the following MEDICAL CONDITION: (primary reason for Home Healthcare) MEDICAL NECESSITY: I certify, that based on my findings, NURSING services are a medically necessary home health service. HOME BOUND STATUS: I certify that my clinical findings support that this patient is homebound (i.e., Due to illness or injury, pt requires aid of supportive devices such as crutches, cane, wheelchairs, walkers, the use of special transportation or the assistance of another person to leave their place of residence. There is a normal inability to leave the home and doing so requires considerable and taxing effort. Other absences are for medical reasons / religious services and are infrequent or of short duration when for other reasons). o If current dressing causes regression in wound condition, may D/C ordered dressing product/s and apply Normal Saline Moist Dressing daily until next Wound Healing Center / Other MD appointment. Notify Wound Healing Center of regression in wound condition at [phone]. o Please direct any NON-WOUND related issues/requests for orders to  patient's Primary Care Physician Wound #7 Left,Lateral Malleolus o [facility] - [facility] o Home Health Nurse may visit PRN to address patientos wound care needs. o FACE TO FACE ENCOUNTER: MEDICARE and MEDICAID PATIENTS: I certify that this patient is under my care and that I had a face-to-face encounter that meets the physician face-to-face encounter requirements with this patient on this date. The encounter with the patient was in whole or in part for the following MEDICAL CONDITION: (primary reason for Home Healthcare) MEDICAL NECESSITY: I certify, that based on my findings, NURSING services are a medically necessary home health service. HOME BOUND STATUS: I certify that my clinical findings support that this patient is homebound (i.e., Due to illness or injury, pt requires aid of supportive devices such as crutches, cane, wheelchairs, walkers, the use of special transportation or the assistance of another person to leave their place of residence. There is a normal inability to leave the home and doing so requires considerable and taxing effort. Other absences are for medical reasons / religious services and are infrequent or of short duration when for other reasons). o If current dressing causes regression in wound condition, may D/C ordered dressing product/s and apply Normal Saline Moist Dressing daily until next Wound Healing Center / Other MD appointment. Notify Wound Healing Center of regression in wound condition at [phone]. o Please direct any NON-WOUND related issues/requests for  orders to patient's Primary Care Physician Wound #8 Left,Posterior Lower Leg o [facility] - [facility] o Home Health Nurse may visit PRN to address patientos wound care needs. o FACE TO FACE ENCOUNTER: MEDICARE and MEDICAID PATIENTS: I certify that this patient is under my care and that I had a face-to-face encounter that meets the physician  face-to-face encounter requirements with this patient on this date. The encounter with the patient was in whole or in part for the following MEDICAL CONDITION: (primary reason for Home Healthcare) MEDICAL NECESSITY: I certify, that based on my findings, NURSING services are a medically necessary home health service. HOME BOUND STATUS: I certify that my clinical findings support that this patient is homebound (i.e., Due to illness or injury, pt requires aid of BRYA, SIMERLY. ([id number]) supportive devices such as crutches, cane, wheelchairs, walkers, the use of special transportation or the assistance of another person to leave their place of residence. There is a normal inability to leave the home and doing so requires considerable and taxing effort. Other absences are for medical reasons / religious services and are infrequent or of short duration when for other reasons). o If current dressing causes regression in wound condition, may D/C ordered dressing product/s and apply Normal Saline Moist Dressing daily until next Wound Healing Center / Other MD appointment. Notify Wound Healing Center of regression in wound condition at [phone]. o Please direct any NON-WOUND related issues/requests for orders to patient's Primary Care Physician Wound #9 Right,Lateral Lower Leg o [facility] - [facility] o Home Health Nurse may visit PRN to address patientos wound care needs. o FACE TO FACE ENCOUNTER: MEDICARE and MEDICAID PATIENTS: I certify that this patient is under my care and that I had a face-to-face encounter that meets the physician face-to-face encounter requirements with this patient on this date. The encounter with the patient was in whole or in part for the following MEDICAL CONDITION: (primary reason for Home Healthcare) MEDICAL NECESSITY: I certify, that based on my findings, NURSING services are a medically necessary home health service. HOME BOUND  STATUS: I certify that my clinical findings support that this patient is homebound (i.e., Due to illness or injury, pt requires aid of supportive devices such as crutches, cane, wheelchairs, walkers, the use of special transportation or the assistance of another person to leave their place of residence. There is a normal inability to leave the home and doing so requires considerable and taxing effort. Other absences are for medical reasons / religious services and are infrequent or of short duration when for other reasons). o If current dressing causes regression in wound condition, may D/C ordered dressing product/s and apply Normal Saline Moist Dressing daily until next Wound Healing Center / Other MD appointment. Notify Wound Healing Center of regression in wound condition at [phone]. o Please direct any NON-WOUND related issues/requests for orders to patient's Primary Care Physician Notes Use foam to top of feet Electronic Signature(s) Signed: 12/23/2016 4:31:20 PM By: Baltazar Najjar MD Signed: 12/23/2016 5:14:09 PM By: [name] Entered By: [name] on 12/23/2016 16:24:18 Rozario, [name] ([id number]) -------------------------------------------------------------------------------- Problem List Details [name] Date of Service: 12/23/2016 3:30 PM Patient Name: [name] Account Number: [id number] Medical Record Treating RN: [name] [id number] Number: Other Clinician: 1920-07-31 (83 y.o. 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 Account Number: [id number] Medical Record Treating RN: [name] [id number] Number: Other Clinician: 06/09/1921 (83 y.o. Treating Caley Volkert Date of Birth/Sex: Female) Provider/Extender: G Primary Care Provider: [name] Referring Provider: [name] in Treatment: 2 Subjective Chief Complaint Information obtained from Patient Follow-up regarding right lateral calf wounds and right heel pressure injury History of Present Illness (HPI) 03/24/16; this is an elderly 83 year old woman with advanced dementia who was hospitalized from 5/8 through 5/13. At that point she had Proteus sepsis felt to be secondary to a UTI the. Acute kidney injury and delirium. Her son who is her primary caregiver says she came home from the hospital with wounds on her right lower extremity and apparently her right buttock as well. There is no mention on the hospital discharge summary of problems. She has been having [facility] and have been using silver alginate based dressings. Apparently the area on the right buttock is healing and the son stated we did not need to become involved with that. In any case the patient has for wounds to on the right heel and 2 on the posterior lateral right calf, the latter of which is not a usual pressure area however that is the history. She is apparently eating and drinking fairly well. Takes ensure well. She does not have any pressure-relief surface at  home. I have reviewed her lab work from the hospital. Her admission albumin was 3.4 on 5/8 04/01/16; patient arrives with wounds looking much the same as last week. She has an extensive pressure area over her right heel and to small but deep wounds on the lateral aspect of her right lower leg. These wounds are not connected. Although there are advertised his pressure ulcers these 2 wounds are not usual for pressure ulcerations. We have not looked at the pressure ulceration on her buttock at the request of the patient's son who is the primary caregiver 04/08/16; wounds are stable to improved this week. Culture I did of the lower wound on her right lateral leg grew methicillin sensitive staph aureus she is on Septra. We are using silver alginate to all the wounds. 04/15/16; the 2 small areas on the lateral aspect of this lady's right leg continued to improve however the heel is once again covered with callus, residual alginate, acrotic material all of which requires a difficult debridement. There continues to be purulent material under this surface. This is in spite of a week of Septra that I gave him for MSSA 04/22/16 Patient today presents for follow-up evaluation. She is seen with her son in the office at this point in time she does have Alzheimer's. The good news is her wounds appeared to be somewhat smaller with current therapy. At this point in time the infection which was cultured previously appears to have improved in regard to the right heel wound. There is no purulent drainage at this point in time and a good portion of the wound actually is eschar covered  and dry with a medial portion actually open to granulation with very little slough covering. She really has no significant discomfort with palpation manipulation of the wound at this point in time. [name], [name] ([id number]) 04/28/16 patient presents today for follow-up evaluation concerning her right lateral calf wound as well  as right heel wound. she has not really been complaining this for as pain is concerned according to her son seen with her in the office at this point in time today. She seems to show no interval signs or symptoms of infection overall has been tolerating the dressing changes well. She does have Alzheimer's therefore is not able to rate her pain although she does respond with an affirmative that she hurts when I press over the heel region. 05/05/16; the area on the lateral aspect of her right calf is healed. The right heel as 2 small spots that are still open and very close to resolving as well using Aquacel Ag under border foam, 05/20/16; the area on the lateral aspect of her right calf remains closed. The 2 small areas on her right heel are also closed. READMISSION 12/09/16; this is a 83 year old woman with advanced dementia that we cared for in the clinic from September to the beginning of November 2017. At that point she had a right heel, right lateral calf and right buttock wound all felt to be secondary to pressure. These eventually closed over. The patient's son who is the primary caregiver states that over the last 2 months she is developed bilateral lower extremity pressure ulcers. [facility] and they have been applying Medihoney and an alginate. These were apparently all pressure ulcers today including area on the left posterior calf. Apparently they were keeping her leg elevated on the pillow which contributed to this. The patient has advanced dementia, is nonambulatory and not able to move herself in bed. There is apparently not a nutritional issue. [facility] has [facility]. 12/16/16; patient was readmitted to the clinic last week with multiple wounds including the medial and lateral aspects of the left calcaneus left posterior calf right calcaneus. We've been using Iodoflex, dressing change by home health. Her son also reports that she has had trouble  breathing which she attributes to the low air loss mattress we have for pressure relief. He states that when he took her out of bed and put her in her chair breathing improved. As of Monday he took the low air loss mattress off the bed and states that she is able to go to bed without shortness of breath. His description sounded a lot like orthopnea. He is asking [place] to order a gel mattress overlay. According to her son she does not aspirate at least not frequently 12/23/16; the patient is reviewed today for multiple wounds on her lower extremities. We have been using Iodoflex dressing change by home health. Last week the patient was Cheyne-Stokes in with an x-ray that look like heart failure with small bilateral pleural effusions. I gave her Lasix and some potassium. She saw her primary physician earlier this week who did not add anything to this. Her son states she is doing better she has not had any of the orthopneic episodes since Monday Objective Constitutional [name], [name]. ([id number]) Patient is hypertensive.. Pulse regular and within target range for patient.Marland Kitchen Respiratory effort pronounced and rate above normal.. Temperature is normal and within the target range for the patient.Marland Kitchen Appears to be uncomfortable. Especially when we go near  her legs.. Vitals Time Taken: 3:36 PM, Height: 69 in, Weight: 105 lbs, BMI: 15.5, Temperature: 97.7 F, Pulse: 92 bpm, Respiratory Rate: 16 breaths/min, Blood Pressure: 169/71 mmHg. Respiratory Above normal respiratory effort noted. Respiratory rate elveaated. As opposed the last week no Cheyne- Stokes respirations. Crackles in the left lower lobe. Cardiovascular Faint dorsalis pedis pulses bilaterally. No edema. Gastrointestinal (GI) Abdomen is soft and non-distended without masses or tenderness. Bowel sounds active in all quadrants.. No liver or spleen enlargement or tenderness.Marland Kitchen Psychiatric No major difference from what I'm used to seeing.  Severe dementia. General Notes: Wound exam; multiple deep punched out wounds in this woman's bilateral lower extremities. Her arterial supply/insufficiency might be playing a role here however I can't imagine doing anything in terms of arterial investigations. On the right leg she has an area on the right medial heel that is still necrotic, reasonably clean wound on the right lateral malleolus and in improving but deep wound on the right posterior calf. On the left leg she has deep wounds on the left lateral calcaneus and left medial calcaneus. In's small new wound on the left posterior calf. Integumentary (Hair, Skin) Wound #5 status is Open. Original cause of wound was Pressure Injury. The wound is located on the Right Calcaneus. The wound measures 2.5cm length x 9.5cm width x 0.4cm depth; 18.653cm^2 area and 7.461cm^3 volume. There is no tunneling or undermining noted. There is a large amount of purulent drainage noted. The wound margin is distinct with the outline attached to the wound base. There is small (1-33%) red granulation within the wound bed. There is a large (67-100%) amount of necrotic tissue within the wound bed including Eschar and Adherent Slough. The periwound skin appearance exhibited: Erythema. The periwound skin appearance did not exhibit: Ecchymosis. The surrounding wound skin color is noted with erythema which is circumferential. Periwound temperature was noted as No Abnormality. The periwound has tenderness on palpation. Wound #6 status is Open. Original cause of wound was Pressure Injury. The wound is located on the Left Calcaneus. The wound measures 3cm length x 3.4cm width x 0.2cm depth; 8.011cm^2 area and 1.602cm^3 volume. There is no tunneling or undermining noted. There is a large amount of purulent drainage noted. The wound margin is distinct with the outline attached to the wound base. There is no granulation within the wound bed. There is a large (67-100%)  amount of necrotic tissue within the wound bed including Eschar. The periwound skin appearance exhibited: Erythema. The surrounding wound skin color is noted with erythema which is circumferential. Periwound temperature was noted as No Abnormality. The periwound has tenderness on palpation. Wound #7 status is Open. Original cause of wound was Pressure Injury. The wound is located on the [place]. ([id number]) Left,Lateral Malleolus. The wound measures 1.9cm length x 1.9cm width x 0.3cm depth; 2.835cm^2 area and 0.851cm^3 volume. There is no tunneling or undermining noted. There is a large amount of serosanguineous drainage noted. The wound margin is distinct with the outline attached to the wound base. There is medium (34-66%) red granulation within the wound bed. There is a medium (34-66%) amount of necrotic tissue within the wound bed including Adherent Slough. The periwound skin appearance exhibited: Erythema. The surrounding wound skin color is noted with erythema which is circumferential. Periwound temperature was noted as No Abnormality. The periwound has tenderness on palpation. Wound #8 status is Open. Original cause of wound was Pressure Injury. The wound is located on the Left,Posterior Lower Leg. The wound measures  8.5cm length x 3.7cm width x 0.6cm depth; 24.701cm^2 area and 14.82cm^3 volume. There is no tunneling or undermining noted. There is a large amount of purulent drainage noted. The wound margin is distinct with the outline attached to the wound base. There is small (1-33%) red granulation within the wound bed. There is a large (67-100%) amount of necrotic tissue within the wound bed including Eschar and Adherent Slough. The periwound skin appearance exhibited: Erythema. The surrounding wound skin color is noted with erythema which is circumferential. Periwound temperature was noted as No Abnormality. The periwound has tenderness on palpation. Wound #9 status is  Open. Original cause of wound was Pressure Injury. The wound is located on the Right,Lateral Lower Leg. The wound measures 1.3cm length x 0.9cm width x 0.1cm depth; 0.919cm^2 area and 0.092cm^3 volume. There is no tunneling or undermining noted. There is a large amount of serosanguineous drainage noted. The wound margin is distinct with the outline attached to the wound base. There is large (67-100%) red granulation within the wound bed. There is no necrotic tissue within the wound bed. The periwound skin appearance exhibited: Erythema. The surrounding wound skin color is noted with erythema which is circumferential. Periwound temperature was noted as No Abnormality. The periwound has tenderness on palpation. 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 [name], [name] ([id number]) Cleanse wound with mild soap and water Wound #6 Left Calcaneus: Clean wound with Normal Saline. Cleanse wound with mild soap and water Wound #7 Left,Lateral Malleolus: Clean wound with Normal Saline. Cleanse wound with mild soap and water Wound #9 Right,Lateral Lower Leg: Clean wound with Normal Saline. Cleanse wound with mild soap and water Wound #8 Left,Posterior Lower Leg: Clean wound with Normal Saline. Cleanse wound with mild soap and water Anesthetic: Wound #5 Right Calcaneus: Topical Lidocaine 4% cream applied to wound bed prior to debridement - for clinic use Wound #9 Right,Lateral Lower Leg: Topical Lidocaine 4% cream applied to wound bed prior to debridement - for clinic use Wound #6 Left Calcaneus: Topical Lidocaine 4% cream applied to wound bed prior to  debridement Wound #7 Left,Lateral Malleolus: Topical Lidocaine 4% cream applied to wound bed prior to debridement - for clinic use Wound #8 Left,Posterior Lower Leg: Topical Lidocaine 4% cream applied to wound bed prior to debridement - for clinic use Primary Wound Dressing: Wound #5 Right Calcaneus: Iodoflex Wound #9 Right,Lateral Lower Leg: Iodoflex Wound #6 Left Calcaneus: Iodoflex Wound #7 Left,Lateral Malleolus: Iodoflex Wound #8 Left,Posterior Lower Leg: Iodoflex Secondary Dressing: Wound #5 Right Calcaneus: Dry Gauze Other - Allevyn heel cup Wound #6 Left Calcaneus: Dry Gauze Other - Allevyn heel cup Wound #7 Left,Lateral Malleolus: ABD pad Dry Gauze Foam - non adhesive foam Wound #9 Right,Lateral Lower Leg: ABD pad Dry Gauze Foam - non adhesive foam Wound #8 Left,Posterior Lower Leg: [name], [name] ([id number]) ABD pad Dry Gauze Foam - non adhesive foam Dressing Change Frequency: Wound #5 Right Calcaneus: Change Dressing Monday, Wednesday, Friday Wound #9 Right,Lateral Lower Leg: Change Dressing Monday, Wednesday, Friday Wound #6 Left Calcaneus: Change Dressing Monday, Wednesday, Friday Wound #7 Left,Lateral Malleolus: Change Dressing Monday, Wednesday, Friday Wound #8 Left,Posterior Lower Leg: Change Dressing Monday, Wednesday,  Friday Follow-up Appointments: Wound #5 Right Calcaneus: Return Appointment in 1 week. Wound #9 Right,Lateral Lower Leg: Return Appointment in 1 week. Wound #6 Left Calcaneus: Return Appointment in 1 week. Wound #7 Left,Lateral Malleolus: Return Appointment in 1 week. Wound #8 Left,Posterior Lower Leg: Return Appointment in 1 week. Edema Control: Wound #5 Right Calcaneus: Kerlix and Coban - Bilateral - lightly wrapped from toes to 3 cm below the knee Wound #9 Right,Lateral Lower Leg: Kerlix and Coban - Bilateral - lightly wrapped from toes to 3 cm below the knee Wound #6 Left Calcaneus: Kerlix and Coban - Bilateral  - lightly wrapped from toes to 3 cm below the knee Wound #7 Left,Lateral Malleolus: Kerlix and Coban - Bilateral - lightly wrapped from toes to 3 cm below the knee Wound #8 Left,Posterior Lower Leg: Kerlix and Coban - Bilateral - lightly wrapped from toes to 3 cm below the knee Off-Loading: Wound #9 Right,Lateral Lower Leg: Mattress Turn and reposition every 2 hours Additional Orders / Instructions: Wound #5 Right Calcaneus: Increase protein intake. Other: - Please add vitamin A, vitamin C and zinc supplements to your diet Wound #9 Right,Lateral Lower Leg: Increase protein intake. Other: - Please add vitamin A, vitamin C and zinc supplements to your diet Wound #6 Left Calcaneus: Increase protein intake. Other: - Please add vitamin A, vitamin C and zinc supplements to your diet Wound #7 Left,Lateral Malleolus: [name], [name] L. ([id number]) Increase protein intake. Other: - Please add vitamin A, vitamin C and zinc supplements to your diet Wound #8 Left,Posterior Lower Leg: Increase protein intake. Other: - Please add vitamin A, vitamin C and zinc supplements to your diet Home Health: Wound #5 Right Calcaneus: Continue Home Health Visits - [facility] Nurse may visit PRN to address patient s wound care needs. FACE TO FACE ENCOUNTER: MEDICARE and MEDICAID PATIENTS: I certify that this patient is under my care and that I had a face-to-face encounter that meets the physician face-to-face encounter requirements with this patient on this date. The encounter with the patient was in whole or in part for the following MEDICAL CONDITION: (primary reason for Home Healthcare) MEDICAL NECESSITY: I certify, that based on my findings, NURSING services are a medically necessary home health service. HOME BOUND STATUS: I certify that my clinical findings support that this patient is homebound (i.e., Due to illness or injury, pt requires aid of supportive devices such as crutches, cane,  wheelchairs, walkers, the use of special transportation or the assistance of another person to leave their place of residence. There is a normal inability to leave the home and doing so requires considerable and taxing effort. Other absences are for medical reasons / religious services and are infrequent or of short duration when for other reasons). If current dressing causes regression in wound condition, may D/C ordered dressing product/s and apply Normal Saline Moist Dressing daily until next Wound Healing Center / Other MD appointment. Notify Wound Healing Center of regression in wound condition at [phone]. Please direct any NON-WOUND related issues/requests for orders to patient's Primary Care Physician Wound #9 Right,Lateral Lower Leg: Continue Home Health Visits - [facility] Nurse may visit PRN to address patient s wound care needs. FACE TO FACE ENCOUNTER: MEDICARE and MEDICAID PATIENTS: I certify that this patient is under my care and that I had a face-to-face encounter that meets the physician face-to-face encounter requirements with this patient on this date. The encounter with the patient was in whole or in part for the following  MEDICAL CONDITION: (primary reason for Home Healthcare) MEDICAL NECESSITY: I certify, that based on my findings, NURSING services are a medically necessary home health service. HOME BOUND STATUS: I certify that my clinical findings support that this patient is homebound (i.e., Due to illness or injury, pt requires aid of supportive devices such as crutches, cane, wheelchairs, walkers, the use of special transportation or the assistance of another person to leave their place of residence. There is a normal inability to leave the home and doing so requires considerable and taxing effort. Other absences are for medical reasons / religious services and are infrequent or of short duration when for other reasons). If current dressing causes regression  in wound condition, may D/C ordered dressing product/s and apply Normal Saline Moist Dressing daily until next Wound Healing Center / Other MD appointment. Notify Wound Healing Center of regression in wound condition at [phone]. Please direct any NON-WOUND related issues/requests for orders to patient's Primary Care Physician Wound #6 Left Calcaneus: Continue Home Health Visits - [facility] Nurse may visit PRN to address patient s wound care needs. FACE TO FACE ENCOUNTER: MEDICARE and MEDICAID PATIENTS: I certify that this patient is under my care and that I had a face-to-face encounter that meets the physician face-to-face encounter requirements with this patient on this date. The encounter with the patient was in whole or in part for the following MEDICAL CONDITION: (primary reason for Home Healthcare) MEDICAL NECESSITY: I certify, that based on my findings, NURSING services are a medically necessary home health service. HOME BOUND STATUS: I certify that my clinical findings support that this patient is homebound (i.e., Due to illness or injury, pt requires aid of supportive devices such as crutches, cane, wheelchairs, walkers, the use of special transportation or the assistance of another person to leave their place of residence. There is a Payette, Deshaun L. ([id number]) normal inability to leave the home and doing so requires considerable and taxing effort. Other absences are for medical reasons / religious services and are infrequent or of short duration when for other reasons). If current dressing causes regression in wound condition, may D/C ordered dressing product/s and apply Normal Saline Moist Dressing daily until next Wound Healing Center / Other MD appointment. Notify Wound Healing Center of regression in wound condition at [phone]. Please direct any NON-WOUND related issues/requests for orders to patient's Primary Care Physician Wound #7 Left,Lateral  Malleolus: Continue Home Health Visits - [facility] Nurse may visit PRN to address patient s wound care needs. FACE TO FACE ENCOUNTER: MEDICARE and MEDICAID PATIENTS: I certify that this patient is under my care and that I had a face-to-face encounter that meets the physician face-to-face encounter requirements with this patient on this date. The encounter with the patient was in whole or in part for the following MEDICAL CONDITION: (primary reason for Home Healthcare) MEDICAL NECESSITY: I certify, that based on my findings, NURSING services are a medically necessary home health service. HOME BOUND STATUS: I certify that my clinical findings support that this patient is homebound (i.e., Due to illness or injury, pt requires aid of supportive devices such as crutches, cane, wheelchairs, walkers, the use of special transportation or the assistance of another person to leave their place of residence. There is a normal inability to leave the home and doing so requires considerable and taxing effort. Other absences are for medical reasons / religious services and are infrequent or of short duration when for other reasons). If current dressing causes  regression in wound condition, may D/C ordered dressing product/s and apply Normal Saline Moist Dressing daily until next Wound Healing Center / Other MD appointment. Notify Wound Healing Center of regression in wound condition at [phone]. Please direct any NON-WOUND related issues/requests for orders to patient's Primary Care Physician Wound #8 Left,Posterior Lower Leg: Continue Home Health Visits - [facility] Nurse may visit PRN to address patient s wound care needs. FACE TO FACE ENCOUNTER: MEDICARE and MEDICAID PATIENTS: I certify that this patient is under my care and that I had a face-to-face encounter that meets the physician face-to-face encounter requirements with this patient on this date. The encounter with the patient  was in whole or in part for the following MEDICAL CONDITION: (primary reason for Home Healthcare) MEDICAL NECESSITY: I certify, that based on my findings, NURSING services are a medically necessary home health service. HOME BOUND STATUS: I certify that my clinical findings support that this patient is homebound (i.e., Due to illness or injury, pt requires aid of supportive devices such as crutches, cane, wheelchairs, walkers, the use of special transportation or the assistance of another person to leave their place of residence. There is a normal inability to leave the home and doing so requires considerable and taxing effort. Other absences are for medical reasons / religious services and are infrequent or of short duration when for other reasons). If current dressing causes regression in wound condition, may D/C ordered dressing product/s and apply Normal Saline Moist Dressing daily until next Wound Healing Center / Other MD appointment. Notify Wound Healing Center of regression in wound condition at [phone]. Please direct any NON-WOUND related issues/requests for orders to patient's Primary Care Physician General Notes: Use foam to top of feet #1 I continued the Iodoflex although some of these wounds are probably ready for something else until they are all in roughly the same state I'm going to continue the debridement effort. CYLIE, DOR ([id number]) #2 also here again in 2 weeks perhaps Hydra fair at that point or silver collagen #3 none of these wounds appears to be infected although I wonder about her arterial status. She is not a candidate for noninvasive or invasive arterial studies for that matter #4 she is not Cheyne-Stokes in daily however she is tachypneic. Her son denies gross aspiration Electronic Signature(s) Signed: 12/23/2016 4:31:20 PM By: Baltazar Najjar MD Entered By: Baltazar Najjar on 12/23/2016 16:25:10 [place], [name]  ([id number]) -------------------------------------------------------------------------------- SuperBill Details [name] Date of Service: 12/23/2016 Patient Name: [name] Account Number: [id number] Medical Record Treating RN: [name] [id number] Number: Other Clinician: [birth year] (83 y.o. 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

## 2016-12-24 NOTE — Progress Notes (Signed)
**Note [name]-Identified via Obfuscation** [name] ([id number]) Visit Report for 12/23/2016 Arrival Information Details [name] Date of Service: 12/23/2016 3:30 PM Patient Name: [name] Account Number: [id number] Medical Record Treating RN: [name] [id number] Number: Other Clinician: Date of Birth/Sex: 11-10-1920 (83 y.o. Female) Treating [name] Primary Care Dewarren [name]: [name] [name] Referring [name]: [name] in Treatment: 2 Visit Information History Since Last Visit All ordered tests and consults were No Patient Arrived: Wheel Chair completed: Arrival Time: 15:35 Added or deleted any medications: No Accompanied By: son Any new allergies or adverse No Transfer Assistance: Other reactions: Patient Identification Verified: Yes Had a fall or experienced change in No Secondary Verification Process Yes activities of daily living that may Completed: affect Patient Requires Transmission- No risk of falls: Based Precautions: Signs or symptoms of abuse/neglect No Patient Has Alerts: Yes since last visito Patient Alerts: L ABI non- Hospitalized since last visit: No compressible Has Dressing in Place as Yes R ABI non- Prescribed: compressible Has Compression in Place as Yes Prescribed: Pain Present Now: Unable to Respond Electronic Signature(s) Signed: 12/23/2016 5:14:09 PM By: [name] Entered By: [name] on 12/23/2016 15:36:13 [name], [name] ([id number]) -------------------------------------------------------------------------------- Clinic Level of Care Assessment Details [name] Date of Service: 12/23/2016 3:30 PM Patient Name: [name] Account Number: [id number] Medical Record Treating RN: [name] [id number] Number: Other Clinician: Date of Birth/Sex: [birth year] (83 y.o. Female) Treating [name] Primary Care [name]: [name] [name]: [name] Referring [name]: [name] in Treatment: 2 Clinic Level of Care Assessment Items TOOL 4 Quantity Score X - Use when only an EandM is performed on FOLLOW-UP visit 1 0 ASSESSMENTS - Nursing Assessment / Reassessment X - Reassessment of Co-morbidities (includes updates in patient status) 1 10 X - Reassessment of Adherence to Treatment Plan 1 5 ASSESSMENTS - Wound and Skin Assessment / Reassessment []  - Simple Wound Assessment / Reassessment - one wound 0 X - Complex Wound Assessment / Reassessment - multiple wounds 5 5 []  - Dermatologic / Skin Assessment (not related to wound area) 0 ASSESSMENTS - Focused Assessment []  - Circumferential Edema Measurements - multi extremities 0 []  - Nutritional Assessment / Counseling / Intervention 0 []  - Lower Extremity Assessment (monofilament, tuning fork, pulses) 0 []  - Peripheral Arterial Disease Assessment (using hand held doppler) 0 ASSESSMENTS - Ostomy and/or Continence Assessment and Care []  - Incontinence Assessment and Management 0 []  - Ostomy Care Assessment and Management (repouching, etc.) 0 PROCESS - Coordination of Care []  - Simple Patient / Family Education for ongoing care 0 X - Complex (extensive) Patient / Family Education for ongoing care 1 20 X - Staff obtains Chiropractor, Records, Test Results / Process Orders 1 10 X - Staff telephones HHA, Nursing Homes / Clarify orders / etc 1 10 [name], [name] ([id number]) []  - Routine Transfer to another Facility (non-emergent condition) 0 []  - Routine Hospital Admission (non-emergent condition) 0 []  - New Admissions / Manufacturing engineer / Ordering NPWT, Apligraf, etc. 0 []  - Emergency Hospital Admission (emergent condition) 0 []  - Simple Discharge Coordination 0 []  - Complex (extensive) Discharge Coordination 0 PROCESS - Special Needs []  - Pediatric / Minor Patient Management 0 []  - Isolation Patient Management 0 []  - Hearing / Language / Visual special needs 0 []  - Assessment of Community  assistance (transportation, D/C planning, etc.) 0 []  - Additional assistance / Altered mentation 0 []  - Support Surface(s) Assessment (bed, cushion, seat, etc.) 0 INTERVENTIONS - Wound Cleansing / Measurement []  -  Simple Wound Cleansing - one wound 0 X - Complex Wound Cleansing - multiple wounds 5 5 X - Wound Imaging (photographs - any number of wounds) 1 5 []  - Wound Tracing (instead of photographs) 0 []  - Simple Wound Measurement - one wound 0 X - Complex Wound Measurement - multiple wounds 5 5 INTERVENTIONS - Wound Dressings []  - Small Wound Dressing one or multiple wounds 0 []  - Medium Wound Dressing one or multiple wounds 0 X - Large Wound Dressing one or multiple wounds 2 20 X - Application of Medications - topical 1 5 []  - Application of Medications - injection 0 [name], [name] ([id number]) INTERVENTIONS - Miscellaneous []  - External ear exam 0 []  - Specimen Collection (cultures, biopsies, blood, body fluids, etc.) 0 []  - Specimen(s) / Culture(s) sent or taken to Lab for analysis 0 []  - Patient Transfer (multiple staff / Michiel Sites Lift / Similar devices) 0 []  - Simple Staple / Suture removal (25 or less) 0 []  - Complex Staple / Suture removal (26 or more) 0 []  - Hypo / Hyperglycemic Management (close monitor of Blood Glucose) 0 []  - Ankle / Brachial Index (ABI) - do not check if billed separately 0 X - Vital Signs 1 5 Has the patient been seen at the hospital within the last three years: Yes Total Score: 185 Level Of Care: New/Established - Level 5 Electronic Signature(s) Signed: 12/24/2016 4:11:28 PM By: [name] Previous Signature: 12/23/2016 5:14:09 PM Version By: [name] Entered By: [name] on 12/24/2016 09:16:43 [name] ([id number]) -------------------------------------------------------------------------------- Encounter Discharge Information Details [name] Date of Service: 12/23/2016 3:30 PM Patient Name: [name]  Account Number: [id number] Medical Record Treating RN: [name] [id number] Number: Other Clinician: Date of Birth/Sex: March 14, 1921 (83 y.o. Female) Treating [name] Primary Care [name]: [name] Minami Arriaga/Extender: [name] Referring Neoma Uhrich: [name] in Treatment: 2 Encounter Discharge Information Items Discharge Condition: Stable Ambulatory Status: Wheelchair Discharge Destination: Home Transportation: Private Auto Accompanied By: son Schedule Follow-up Appointment: Yes Medication Reconciliation completed Yes and provided to Patient/Care Lister Brizzi: Provided on Clinical Summary of Care: 12/23/2016 Form Type Recipient Paper Patient es Electronic Signature(s) Signed: 12/23/2016 4:16:31 PM By: Dayton Martes RCP, RRT, CHT Entered By: Dayton Martes on 12/23/2016 16:16:31 Manigo, [name] -------------------------------------------------------------------------------- Lower Extremity Assessment Details [name] Date of Service: 12/23/2016 3:30 PM Patient Name: [name] Account Number: [id number] Medical Record Treating RN: [name] [id number] Number: Other Clinician: Date of Birth/Sex: 1920/10/03 (83 y.o. Female) Treating [name] Primary Care Geraldina Parrott: [name] Samary Shatz/Extender: [name] Referring Daltin Crist: [name] Weeks in Treatment: 2 Vascular Assessment Pulses: Dorsalis Pedis Palpable: [Left:Yes] [Right:Yes] Posterior Tibial Extremity colors, hair growth, and conditions: Extremity Color: [Left:Mottled] [Right:Mottled] Temperature of Extremity: [Left:Warm] [Right:Warm] Capillary Refill: [Left:> 3 seconds] [Right:> 3 seconds] Toe Nail Assessment Left: Right: Thick: Yes Yes Discolored: Yes Yes Deformed: Yes Yes Improper Length and Hygiene: Yes Yes Electronic Signature(s) Signed: 12/23/2016 5:14:09 PM By: [name] Entered By: [name] on 12/23/2016  15:53:57 [name], [name] ([id number]) -------------------------------------------------------------------------------- Multi Wound Chart Details [name] Date of Service: 12/23/2016 3:30 PM Patient Name: [name] Account Number: [id number] Medical Record Treating RN: [name] [id number] Number: Other Clinician: Date of Birth/Sex: Nov 16, 1920 (83 y.o. Female) Treating [name] Primary Care Kilian Schwartz: [name] Omaira Mellen/Extender: [name] Referring Cadience Bradfield: [name] Weeks in Treatment: 2 Vital Signs Height(in): 69 Pulse(bpm): 92 Weight(lbs): 105 Blood Pressure 169/71 (mmHg): Body Mass Index(BMI): 16 Temperature(F): 97.7 Respiratory Rate 16 (breaths/min): Photos: [5:No  Photos] [6:No Photos] [7:No Photos] Wound Location: [5:Right Calcaneus] [6:Left Calcaneus] [7:Left Malleolus - Lateral] Wounding Event: [5:Pressure Injury] [6:Pressure Injury] [7:Pressure Injury] Primary Etiology: [5:Pressure Ulcer] [6:Pressure Ulcer] [7:Pressure Ulcer] Comorbid History: [5:Cataracts, Anemia, Hypertension, History of pressure wounds, Osteoarthritis, Dementia] [6:Cataracts, Anemia, Hypertension, History of pressure wounds, Osteoarthritis, Dementia] [7:Cataracts, Anemia, Hypertension, History of pressure  wounds, Osteoarthritis, Dementia] Date Acquired: [5:10/09/2016] [6:10/09/2016] [7:10/09/2016] Weeks of Treatment: [5:2] [6:2] [7:2] Wound Status: [5:Open] [6:Open] [7:Open] Clustered Wound: [5:Yes] [6:No] [7:No] Clustered Quantity: [5:2] [6:N/A] [7:N/A] Pending Amputation on Yes [6:Yes] [7:Yes] Presentation: Measurements L x W x D 2.5x9.5x0.4 [6:3x3.4x0.2] [7:1.9x1.9x0.3] (cm) Area (cm) : [5:18.653] [6:8.011] [7:2.835] Volume (cm) : [5:7.461] [6:1.602] [7:0.851] % Reduction in Area: [5:60.40%] [6:52.60%] [7:21.90%] % Reduction in Volume: -58.30% [6:5.20%] [7:-17.20%] Classification: [5:Category/Stage III] [6:Category/Stage II] [7:Category/Stage II] Exudate  Amount: [5:Large] [6:Large] [7:Large] Exudate Type: [5:Purulent] [6:Purulent] [7:Serosanguineous] Exudate Color: [5:yellow, brown, green] [6:yellow, brown, green] [7:red, brown] Foul Odor After [5:Yes] [6:Yes] [7:Yes] Cleansing: [5:No] [6:No] [7:No] Odor Anticipated Due to Product Use: Wound Margin: Distinct, outline attached Distinct, outline attached Distinct, outline attached Granulation Amount: Small (1-33%) None Present (0%) Medium (34-66%) Granulation Quality: Red N/A Red Necrotic Amount: Large (67-100%) Large (67-100%) Medium (34-66%) Necrotic Tissue: Eschar, Adherent Slough Eschar Adherent Slough Epithelialization: None None None Periwound Skin Texture: No Abnormalities Noted No Abnormalities Noted No Abnormalities Noted Periwound Skin No Abnormalities Noted No Abnormalities Noted No Abnormalities Noted Moisture: Periwound Skin Color: Erythema: Yes Erythema: Yes Erythema: Yes Ecchymosis: No Erythema Location: Circumferential Circumferential Circumferential Temperature: No Abnormality No Abnormality No Abnormality Tenderness on Yes Yes Yes Palpation: Wound Preparation: Ulcer Cleansing: Ulcer Cleansing: Ulcer Cleansing: Rinsed/Irrigated with Rinsed/Irrigated with Rinsed/Irrigated with Saline Saline Saline Topical Anesthetic Topical Anesthetic Topical Anesthetic Applied: Other: lidocaine Applied: Other: lidocaine Applied: Other: lidocaine 4% 4% 4% Wound Number: 8 9 N/A Photos: No Photos No Photos N/A Wound Location: Left Lower Leg - Posterior Right Lower Leg - Lateral N/A Wounding Event: Pressure Injury Pressure Injury N/A Primary Etiology: Pressure Ulcer Pressure Ulcer N/A Comorbid History: Cataracts, Anemia, Cataracts, Anemia, N/A Hypertension, History of Hypertension, History of pressure wounds, pressure wounds, Osteoarthritis, Dementia Osteoarthritis, Dementia Date Acquired: 10/09/2016 12/23/2016 N/A Weeks of Treatment: 2 0 N/A Wound Status: Open Open N/A Clustered  Wound: Yes No N/A Clustered Quantity: N/A N/A N/A Pending Amputation on Yes No N/A Presentation: Measurements L x W x D 8.5x3.7x0.6 1.3x0.9x0.1 N/A (cm) Area (cm) : 24.701 0.919 N/A Volume (cm) : 14.82 0.092 N/A % Reduction in Area: -105.60% N/A N/A % Reduction in Volume: -105.50% N/A N/A Classification: Category/Stage III Category/Stage II N/A Exudate Amount: Large Large N/A Siever, Tonnya L. ([id number]) Exudate Type: Purulent Serosanguineous N/A Exudate Color: yellow, brown, green red, brown N/A Foul Odor After Yes No N/A Cleansing: Odor Anticipated Due to No N/A N/A Product Use: Wound Margin: Distinct, outline attached Distinct, outline attached N/A Granulation Amount: Small (1-33%) Large (67-100%) N/A Granulation Quality: Red Red N/A Necrotic Amount: Large (67-100%) None Present (0%) N/A Necrotic Tissue: Eschar, Adherent Slough N/A N/A Epithelialization: None None N/A Periwound Skin Texture: No Abnormalities Noted No Abnormalities Noted N/A Periwound Skin No Abnormalities Noted No Abnormalities Noted N/A Moisture: Periwound Skin Color: Erythema: Yes Erythema: Yes N/A Erythema Location: Circumferential Circumferential N/A Temperature: No Abnormality No Abnormality N/A Tenderness on Yes Yes N/A Palpation: Wound Preparation: Ulcer Cleansing: Ulcer Cleansing: N/A Rinsed/Irrigated with Rinsed/Irrigated with Saline Saline Topical Anesthetic Topical Anesthetic Applied: Other: lidocaine Applied: Other: lidocaine 4% 4% Treatment Notes Electronic Signature(s) Signed: 12/23/2016 4:31:20 PM By: Leanord Hawking,  Casimiro Needle MD Entered By: Baltazar Najjar on 12/23/2016 16:16:37 [name], [name] ([id number]) -------------------------------------------------------------------------------- Multi-Disciplinary Care Plan Details [name] Date of Service: 12/23/2016 3:30 PM Patient Name: [name] Account Number: [id number] Medical Record Treating RN: [name] [id number] Number: Other Clinician: Date of Birth/Sex: 05/02/1921 (83 y.o. Female) Treating [name] Primary Care Yamaira Spinner: [name] Madhav Mohon/Extender: [name] Referring Channon Ambrosini: [name] in Treatment: 2 Active Inactive ` Abuse / Safety / Falls / Self Care Management Nursing Diagnoses: Potential for falls Goals: Patient will not experience any injury related to falls Date Initiated: 12/09/2016 Target Resolution Date: 02/20/2017 Goal Status: Active Interventions: Assess fall risk on admission and as needed Notes: ` Nutrition Nursing Diagnoses: Imbalanced nutrition Potential for alteratiion in Nutrition/Potential for imbalanced nutrition Goals: Patient/caregiver agrees to and verbalizes understanding of need to use nutritional supplements and/or vitamins as prescribed Date Initiated: 12/09/2016 Target Resolution Date: 03/27/2017 Goal Status: Active Interventions: Assess patient nutrition upon admission and as needed per policy Notes: ` Orientation to the Wound Care Program [name], [name] ([id number]) Nursing Diagnoses: Knowledge deficit related to the wound healing center program Goals: Patient/caregiver will verbalize understanding of the Wound Healing Center Program Date Initiated: 12/09/2016 Target Resolution Date: 12/26/2016 Goal Status: Active Interventions: Provide education on orientation to the wound center Notes: ` Pain, Acute or Chronic Nursing Diagnoses: Pain, acute or chronic: actual or potential Potential alteration in comfort, pain Goals: Patient/caregiver will verbalize adequate pain control between visits Date Initiated: 12/09/2016 Target Resolution Date: 03/27/2017 Goal Status: Active Interventions: Assess comfort goal upon admission Complete pain assessment as per visit requirements Notes: ` Pressure Nursing Diagnoses: Knowledge deficit related to causes and risk factors for pressure ulcer development Knowledge  deficit related to management of pressures ulcers Potential for impaired tissue integrity related to pressure, friction, moisture, and shear Goals: Patient/caregiver will verbalize risk factors for pressure ulcer development Date Initiated: 12/09/2016 Target Resolution Date: 03/27/2017 Goal Status: Active Interventions: Assess: immobility, friction, shearing, incontinence upon admission and as needed [name], RASOOL. ([id number]) Assess offloading mechanisms upon admission and as needed Notes: ` Wound/Skin Impairment Nursing Diagnoses: Impaired tissue integrity Knowledge deficit related to ulceration/compromised skin integrity Goals: Ulcer/skin breakdown will have a volume reduction of 80% by week 12 Date Initiated: 12/09/2016 Target Resolution Date: 03/20/2017 Goal Status: Active Interventions: Assess patient/caregiver ability to perform ulcer/skin care regimen upon admission and as needed Notes: Electronic Signature(s) Signed: 12/23/2016 5:14:09 PM By: [name] Entered By: [name] on 12/23/2016 15:55:22 [name], [name] ([id number]) -------------------------------------------------------------------------------- Pain Assessment Details [name] Date of Service: 12/23/2016 3:30 PM Patient Name: [name] Account Number: [id number] Medical Record Treating RN: [name] [id number] Number: Other Clinician: Date of Birth/Sex: September 14, 1920 (83 y.o. Female) Treating [name] Primary Care Jamea Robicheaux: [name] Linzy Laury/Extender: [name] Referring Rasool Rommel: [name] in Treatment: 2 Active Problems Location of Pain Severity and Description of Pain Patient Has Paino Patient Unable to Respond Site Locations Pain Management and Medication Current Pain Management: Electronic Signature(s) Signed: 12/23/2016 5:14:09 PM By: [name] Entered By: [name] on 12/23/2016 15:36:19 Budzynski, [name]  ([id number]) -------------------------------------------------------------------------------- Patient/Caregiver Education Details [name] Date of Service: 12/23/2016 3:30 PM Patient Name: [name] Account Number: [id number] Medical Record Treating RN: [name] [id number] Number: Other Clinician: [birth year] (83 y.o. Treating [name] Date of Birth/Gender: Female) Physician/Extender: [name] Primary Care Physician: [name] in Treatment: 2 Referring Physician: Antony [name] Education Assessment Education Provided To: Patient Education Topics Provided Wound/Skin Impairment: Handouts: Other: change dressing as ordered  Methods: Demonstration, Explain/Verbal Responses: State content correctly Electronic Signature(s) Signed: 12/23/2016 5:14:09 PM By: [name] Entered By: [name] on 12/23/2016 15:56:04 Snyders, [name] ([id number]) -------------------------------------------------------------------------------- Wound Assessment Details [name] Date of Service: 12/23/2016 3:30 PM Patient Name: [name] Account Number: [id number] Medical Record Treating RN: [name] [id number] Number: Other Clinician: Date of Birth/Sex: 09/28/1920 (83 y.o. Female) Treating [name] Primary Care Mariadel Mruk: [name] Aldine Grainger/Extender: [name] Referring Sonoma Firkus: [name] Weeks in Treatment: 2 Wound Status Wound Number: 5 Primary Pressure Ulcer Etiology: Wound Location: Right Calcaneus Wound Open Wounding Event: Pressure Injury Status: Date Acquired: 10/09/2016 Comorbid Cataracts, Anemia, Hypertension, Weeks Of Treatment: 2 History: History of pressure wounds, Clustered Wound: Yes Osteoarthritis, Dementia Pending Amputation On Presentation Photos Photo Uploaded By: [name] on 12/23/2016 16:52:25 Wound Measurements Length: (cm) 2.5 % Reduction in Width: (cm) 9.5 % Reduction in Depth: (cm) 0.4  Epithelializat Clustered Quantity: 2 Tunneling: Area: (cm) 18.653 Undermining: Volume: (cm) 7.461 Area: 60.4% Volume: -58.3% ion: None No No Wound Description Classification: Category/Stage III Foul Odor Afte Wound Margin: Distinct, outline attached Due to Product Exudate Amount: Large Slough/Fibrino Exudate Type: Purulent Exudate Color: yellow, brown, green r Cleansing: Yes Use: No Yes Wound Bed Granulation Amount: Small (1-33%) Fedder, Jennie L. ([id number]) Granulation Quality: Red Necrotic Amount: Large (67-100%) Necrotic Quality: Eschar, Adherent Slough Periwound Skin Texture Texture Color No Abnormalities Noted: No No Abnormalities Noted: No Ecchymosis: No Moisture Erythema: Yes No Abnormalities Noted: No Erythema Location: Circumferential Temperature / Pain Temperature: No Abnormality Tenderness on Palpation: Yes Wound Preparation Ulcer Cleansing: Rinsed/Irrigated with Saline Topical Anesthetic Applied: Other: lidocaine 4%, Treatment Notes Wound #5 (Right Calcaneus) 1. Cleansed with: Clean wound with Normal Saline Cleanse wound with antibacterial soap and water 2. Anesthetic Topical Lidocaine 4% cream to wound bed prior to debridement 4. Dressing Applied: Iodoflex 5. Secondary Dressing Applied Dry Gauze Foam 7. Secured with Tape Notes heel cup, kerlix, coban Electronic Signature(s) Signed: 12/23/2016 5:14:09 PM By: [name] Entered By: [name] on 12/23/2016 15:47:54 [name], [name] ([id number]) -------------------------------------------------------------------------------- Wound Assessment Details [name] Date of Service: 12/23/2016 3:30 PM Patient Name: [name] Account Number: [id number] Medical Record Treating RN: [name] [id number] Number: Other Clinician: Date of Birth/Sex: 03-14-1921 (83 y.o. Female) Treating [name] Primary Care Tamekia Rotter: [name] Cotton Beckley/Extender:  [name] Referring Zeniah Briney: [name] Weeks in Treatment: 2 Wound Status Wound Number: 6 Primary Pressure Ulcer Etiology: Wound Location: Left Calcaneus Wound Open Wounding Event: Pressure Injury Status: Date Acquired: 10/09/2016 Comorbid Cataracts, Anemia, Hypertension, Weeks Of Treatment: 2 History: History of pressure wounds, Clustered Wound: No Osteoarthritis, Dementia Pending Amputation On Presentation Photos Photo Uploaded By: [name] on 12/23/2016 16:52:57 Wound Measurements Length: (cm) 3 Width: (cm) 3.4 Depth: (cm) 0.2 Area: (cm) 8.011 Volume: (cm) 1.602 % Reduction in Area: 52.6% % Reduction in Volume: 5.2% Epithelialization: None Tunneling: No Undermining: No Wound Description Classification: Category/Stage II Foul Odor Afte Wound Margin: Distinct, outline attached Due to Product Exudate Amount: Large Slough/Fibrino Exudate Type: Purulent Exudate Color: yellow, brown, green r Cleansing: Yes Use: No Yes Wound Bed Granulation Amount: None Present (0%) Necrotic Amount: Large (67-100%) [name], [name] ([id number]) Necrotic Quality: Eschar Periwound Skin Texture Texture Color No Abnormalities Noted: No No Abnormalities Noted: No Erythema: Yes Moisture Erythema Location: Circumferential No Abnormalities Noted: No Temperature / Pain Temperature: No Abnormality Tenderness on Palpation: Yes Wound Preparation Ulcer Cleansing: Rinsed/Irrigated with Saline Topical Anesthetic Applied: Other: lidocaine 4%, Treatment Notes Wound #6 (Left Calcaneus) 1. Cleansed with: Clean  wound with Normal Saline Cleanse wound with antibacterial soap and water 2. Anesthetic Topical Lidocaine 4% cream to wound bed prior to debridement 4. Dressing Applied: Iodoflex 5. Secondary Dressing Applied Dry Gauze Foam Kerlix/Conform 7. Secured with Tape Notes heel cup, kerlix, coban Electronic Signature(s) Signed: 12/23/2016 5:14:09 PM By: [name] Entered By: [name] on 12/23/2016 15:48:34 Cloyd, [name] ([id number]) -------------------------------------------------------------------------------- Wound Assessment Details [name] Date of Service: 12/23/2016 3:30 PM Patient Name: [name] Account Number: [id number] Medical Record Treating RN: [name] [id number] Number: Other Clinician: Date of Birth/Sex: 1920/09/17 (83 y.o. Female) Treating [name] Primary Care Vestal Markin: [name] Jaiyah Beining/Extender: [name] Referring Brighten Buzzelli: [name] Weeks in Treatment: 2 Wound Status Wound Number: 7 Primary Pressure Ulcer Etiology: Wound Location: Left Malleolus - Lateral Wound Open Wounding Event: Pressure Injury Status: Date Acquired: 10/09/2016 Comorbid Cataracts, Anemia, Hypertension, Weeks Of Treatment: 2 History: History of pressure wounds, Clustered Wound: No Osteoarthritis, Dementia Pending Amputation On Presentation Photos Photo Uploaded By: [name] on 12/23/2016 16:52:58 Wound Measurements Length: (cm) 1.9 Width: (cm) 1.9 Depth: (cm) 0.3 Area: (cm) 2.835 Volume: (cm) 0.851 % Reduction in Area: 21.9% % Reduction in Volume: -17.2% Epithelialization: None Tunneling: No Undermining: No Wound Description Classification: Category/Stage II Foul Odor Aft Wound Margin: Distinct, outline attached Due to Produc Exudate Amount: Large Slough/Fibrin Exudate Type: Serosanguineous Exudate Color: red, brown er Cleansing: Yes t Use: No o Yes Wound Bed Granulation Amount: Medium (34-66%) Granulation Quality: Red [name], [name] ([id number]) Necrotic Amount: Medium (34-66%) Necrotic Quality: Adherent Slough Periwound Skin Texture Texture Color No Abnormalities Noted: No No Abnormalities Noted: No Erythema: Yes Moisture Erythema Location: Circumferential No Abnormalities Noted: No Temperature / Pain Temperature: No Abnormality Tenderness on  Palpation: Yes Wound Preparation Ulcer Cleansing: Rinsed/Irrigated with Saline Topical Anesthetic Applied: Other: lidocaine 4%, Treatment Notes Wound #7 (Left, Lateral Malleolus) 1. Cleansed with: Clean wound with Normal Saline Cleanse wound with antibacterial soap and water 2. Anesthetic Topical Lidocaine 4% cream to wound bed prior to debridement 4. Dressing Applied: Iodoflex 5. Secondary Dressing Applied ABD Pad Dry Gauze Foam Notes kerlix, coban Electronic Signature(s) Signed: 12/23/2016 5:14:09 PM By: [name] Entered By: [name] on 12/23/2016 15:49:11 [name], [name] -------------------------------------------------------------------------------- Wound Assessment Details [name] Date of Service: 12/23/2016 3:30 PM Patient Name: [name] Account Number: [id number] Medical Record Treating RN: [name] [id number] Number: Other Clinician: Date of Birth/Sex: 1921/01/12 (83 y.o. Female) Treating [name] Primary Care Deandrea Vanpelt: [name] Beryle Zeitz/Extender: [name] Referring Charvi Gammage: [name] Weeks in Treatment: 2 Wound Status Wound Number: 8 Primary Pressure Ulcer Etiology: Wound Location: Left Lower Leg - Posterior Wound Open Wounding Event: Pressure Injury Status: Date Acquired: 10/09/2016 Comorbid Cataracts, Anemia, Hypertension, Weeks Of Treatment: 2 History: History of pressure wounds, Clustered Wound: Yes Osteoarthritis, Dementia Pending Amputation On Presentation Photos Photo Uploaded By: [name] on 12/23/2016 16:53:31 Wound Measurements Length: (cm) 8.5 Width: (cm) 3.7 Depth: (cm) 0.6 Area: (cm) 24.701 Volume: (cm) 14.82 % Reduction in Area: -105.6% % Reduction in Volume: -105.5% Epithelialization: None Tunneling: No Undermining: No Wound Description Classification: Category/Stage III Foul Odor Aft Wound Margin: Distinct, outline attached Due to Produc Exudate Amount:  Large Slough/Fibrin Exudate Type: Purulent Exudate Color: yellow, brown, green er Cleansing: Yes t Use: No o Yes Wound Bed Granulation Amount: Small (1-33%) Granulation Quality: Red [name], [name] ([id number]) Necrotic Amount: Large (67-100%) Necrotic Quality: Eschar, Adherent Slough Periwound Skin Texture Texture Color No Abnormalities Noted: No No Abnormalities Noted: No Erythema: Yes Moisture  Erythema Location: Circumferential No Abnormalities Noted: No Temperature / Pain Temperature: No Abnormality Tenderness on Palpation: Yes Wound Preparation Ulcer Cleansing: Rinsed/Irrigated with Saline Topical Anesthetic Applied: Other: lidocaine 4%, Treatment Notes Wound #8 (Left, Posterior Lower Leg) 1. Cleansed with: Clean wound with Normal Saline Cleanse wound with antibacterial soap and water 2. Anesthetic Topical Lidocaine 4% cream to wound bed prior to debridement 4. Dressing Applied: Iodoflex 5. Secondary Dressing Applied ABD Pad Dry Gauze Foam Notes kerlix, coban Electronic Signature(s) Signed: 12/23/2016 5:14:09 PM By: [name] MullingPinkerton, Debra Entered By: [name] MullingPinkerton, Debra on 12/23/2016 15:50:16 Huge, [name] SeatsELIZABETH L. ([id number]) -------------------------------------------------------------------------------- Wound Assessment Details [name] BabeSUMMERS, Ritika Date of Service: 12/23/2016 3:30 PM Patient Name: [name] Account Number: [id number] Medical Record Treating RN: [name] Haggisinkerton, Debi [id number] Number: Other Clinician: Date of Birth/Sex: 07-22-1920 (83 y.o. Female) Treating [name] Primary Care Angelica Frandsen: [name] HasteBADGER, [name] Tequisha Maahs/Extender: [name] Referring Reizy Dunlow: [name] HasteBADGER, [name] Weeks in Treatment: 2 Wound Status Wound Number: 9 Primary Pressure Ulcer Etiology: Wound Location: Right Lower Leg - Lateral Wound Open Wounding Event: Pressure Injury Status: Date Acquired: 12/23/2016 Comorbid Cataracts, Anemia, Hypertension, Weeks Of Treatment: 0 History:  History of pressure wounds, Clustered Wound: No Osteoarthritis, Dementia Photos Photo Uploaded By: [name] MullingPinkerton, Debra on 12/23/2016 16:53:31 Wound Measurements Length: (cm) 1.3 Width: (cm) 0.9 Depth: (cm) 0.1 Area: (cm) 0.919 Volume: (cm) 0.092 % Reduction in Area: % Reduction in Volume: Epithelialization: None Tunneling: No Undermining: No Wound Description Classification: Category/Stage II Foul Odor Aft Wound Margin: Distinct, outline attached Slough/Fibrin Exudate Amount: Large Exudate Type: Serosanguineous Exudate Color: red, brown er Cleansing: No o No Wound Bed Granulation Amount: Large (67-100%) Granulation Quality: Red Cato, Mckinzie L. ([id number]) Necrotic Amount: None Present (0%) Periwound Skin Texture Texture Color No Abnormalities Noted: No No Abnormalities Noted: No Erythema: Yes Moisture Erythema Location: Circumferential No Abnormalities Noted: No Temperature / Pain Temperature: No Abnormality Tenderness on Palpation: Yes Wound Preparation Ulcer Cleansing: Rinsed/Irrigated with Saline Topical Anesthetic Applied: Other: lidocaine 4%, Treatment Notes Wound #9 (Right, Lateral Lower Leg) 1. Cleansed with: Clean wound with Normal Saline Cleanse wound with antibacterial soap and water 2. Anesthetic Topical Lidocaine 4% cream to wound bed prior to debridement 4. Dressing Applied: Iodoflex 5. Secondary Dressing Applied ABD Pad Dry Gauze Foam Notes kerlix, coban Electronic Signature(s) Signed: 12/23/2016 5:14:09 PM By: [name] MullingPinkerton, Debra Entered By: [name] MullingPinkerton, Debra on 12/23/2016 15:50:40 Pawley, [name] SeatsELIZABETH L. ([id number]) -------------------------------------------------------------------------------- Vitals Details [name] BabeSUMMERS, Brennley Date of Service: 12/23/2016 3:30 PM Patient Name: [name] Account Number: [id number] Medical Record Treating RN: [name] Haggisinkerton, Debi [id number] Number: Other Clinician: Date of Birth/Sex: 07-22-1920 (83 y.o.  Female) Treating [name] Primary Care Azaiah Mello: [name] HasteBADGER, [name] Odette Watanabe/Extender: [name] Referring Anuar Walgren: [name] HasteBADGER, [name] Weeks in Treatment: 2 Vital Signs Time Taken: 15:36 Temperature (F): 97.7 Height (in): 69 Pulse (bpm): 92 Weight (lbs): 105 Respiratory Rate (breaths/min): 16 Body Mass Index (BMI): 15.5 Blood Pressure (mmHg): 169/71 Reference Range: 80 - 120 mg / dl Electronic Signature(s) Signed: 12/23/2016 5:14:09 PM By: [name] MullingPinkerton, Debra Entered By: [name] MullingPinkerton, Debra on 12/23/2016 15:40:11

## 2017-01-06 ENCOUNTER — Encounter: Payer: Medicare Other | Admitting: Internal Medicine

## 2017-01-06 DIAGNOSIS — L89623 Pressure ulcer of left heel, stage 3: Secondary | ICD-10-CM | POA: Diagnosis not present

## 2017-01-08 NOTE — Progress Notes (Signed)
PEG, FIFER ([id number]) Visit Report for 01/06/2017 Arrival Information Details [name], [name] Date of Service: 01/06/2017 2:30 PM Patient Name: [name] Account Number: [id number] Medical Record Treating RN: [name] [id number] Number: Other Clinician: Date of Birth/Sex: 14-Oct-1920 (83 y.o. Female) Treating [name], [name] Primary Care Iyari Hagner: [name] Shanta Dorvil/Extender: G Referring Letonya Mangels: [name] in Treatment: 4 Visit Information History Since Last Visit Added or deleted any medications: No Patient Arrived: Wheel Chair Any new allergies or adverse No Arrival Time: 14:39 reactions: Accompanied By: son Had a fall or experienced change in No Transfer Assistance: Manual activities of daily living that may Patient Identification Verified: Yes affect Secondary Verification Process Yes risk of falls: Completed: Signs or symptoms of abuse/neglect No Patient Requires Transmission- No since last visito Based Precautions: Hospitalized since last visit: No Patient Has Alerts: Yes Has Dressing in Place as Yes Patient Alerts: L ABI non- Prescribed: compressible Pain Present Now: Unable to R ABI non- Respond compressible Electronic Signature(s) Signed: 01/06/2017 5:03:50 PM By: Elliot Gurney, BSN, RN, CWS, Kim RN, BSN Entered By: Elliot Gurney, BSN, RN, CWS, Kim on 01/06/2017 14:40:17 Mcilrath, [name] ([id number]) -------------------------------------------------------------------------------- Encounter Discharge Information Details [name] Date of Service: 01/06/2017 2:30 PM Patient Name: [name] Account Number: [id number] Medical Record Treating RN: [name] [id number] Number: Other Clinician: Date of Birth/Sex: 08-Mar-1921 (83 y.o. Female) Treating [name], [name] Primary Care Raife Lizer: [name] Linnaea Ahn/Extender: G Referring Dontrae Morini: [name] in Treatment: 4 Encounter Discharge Information  Items Discharge Condition: Stable Ambulatory Status: Wheelchair Discharge Destination: Home Transportation: Private Auto Accompanied By: son Schedule Follow-up Appointment: Yes Medication Reconciliation completed and provided to Patient/Care No Nicolet Griffy: Provided on Clinical Summary of Care: 01/06/2017 Form Type Recipient Paper Patient ES Electronic Signature(s) Signed: 01/06/2017 3:35:20 PM By: Gwenlyn Perking Entered By: Gwenlyn Perking on 01/06/2017 15:35:20 Butson, [name] ([id number]) -------------------------------------------------------------------------------- Lower Extremity Assessment Details [name] Date of Service: 01/06/2017 2:30 PM Patient Name: [name] Account Number: [id number] Medical Record Treating RN: [name] [id number] Number: Other Clinician: Date of Birth/Sex: [birth year] (83 y.o. Female) Treating [name], [name] Primary Care Reagann Dolce: [name] Muhamed Luecke/Extender: G Referring Britanee Vanblarcom: [name] Weeks in Treatment: 4 Vascular Assessment Pulses: Dorsalis Pedis Palpable: [Left:Yes] [Right:Yes] Posterior Tibial Extremity colors, hair growth, and conditions: Extremity Color: [Left:Mottled] [Right:Mottled] Temperature of Extremity: [Left:Warm] [Right:Warm] Capillary Refill: [Left:> 3 seconds] [Right:> 3 seconds] Toe Nail Assessment Left: Right: Thick: Yes Yes Discolored: Yes Yes Deformed: Yes Yes Improper Length and Hygiene: Yes Yes Electronic Signature(s) Signed: 01/06/2017 5:12:54 PM By: [name] Entered By: [name] on 01/06/2017 15:01:15 Fennema, [name] ([id number]) -------------------------------------------------------------------------------- Multi Wound Chart Details [name] Date of Service: 01/06/2017 2:30 PM Patient Name: [name] Account Number: [id number] Medical Record Treating RN: [name] [id number] Number: Other Clinician: Date of Birth/Sex: 12-28-1920 (83  y.o. Female) Treating [name], [name] Primary Care Terrilee Dudzik: [name] Myleah Cavendish/Extender: G Referring Mirra Basilio: [name] Weeks in Treatment: 4 Vital Signs Height(in): 69 Pulse(bpm): 96 Weight(lbs): 105 Blood Pressure 169/84 (mmHg): Body Mass Index(BMI): 16 Temperature(F): 97.7 Respiratory Rate 16 (breaths/min): Photos: Wound Location: Right Calcaneus Left Calcaneus Left Malleolus - Lateral Wounding Event: Pressure Injury Pressure Injury Pressure Injury Primary Etiology: Pressure Ulcer Pressure Ulcer Pressure Ulcer Comorbid History: Cataracts, Anemia, Cataracts, Anemia, Cataracts, Anemia, Hypertension, History of Hypertension, History of Hypertension, History of pressure wounds, pressure wounds, pressure wounds, Osteoarthritis, Dementia Osteoarthritis, Dementia Osteoarthritis, Dementia Date Acquired: 10/09/2016 10/09/2016 10/09/2016 Weeks of Treatment: 4 4 4  Wound Status: Open Open Open Clustered Wound: Yes No No Clustered  Quantity: 2 N/A N/A Pending Amputation on Yes Yes Yes Presentation: [name], [name] ([id number]) Measurements L x W x D 1.8x8.2x0.4 2.4x2.7x0.2 1.5x0.8x0.2 (cm) Area (cm) : 11.592 5.089 0.942 Volume (cm) : 4.637 1.018 0.188 % Reduction in Area: 75.40% 69.90% 74.00% % Reduction in Volume: 1.60% 39.70% 74.10% Classification: Category/Stage III Category/Stage II Category/Stage II Exudate Amount: Large Medium Large Exudate Type: Purulent Purulent Serosanguineous Exudate Color: yellow, brown, green yellow, brown, green red, brown Foul Odor After Yes Yes Yes Cleansing: Odor Anticipated Due to No No No Product Use: Wound Margin: Distinct, outline attached Distinct, outline attached Distinct, outline attached Granulation Amount: Small (1-33%) None Present (0%) Medium (34-66%) Granulation Quality: Red N/A Red Necrotic Amount: Large (67-100%) Large (67-100%) Medium (34-66%) Necrotic Tissue: Eschar, Adherent Slough Eschar Adherent  Slough Epithelialization: None None None Debridement: Debridement ([id number] Debridement ([id number]- N/A 11047) 11047) Pre-procedure 15:06 15:06 N/A Verification/Time Out Taken: Pain Control: Lidocaine 4% Topical Lidocaine 4% Topical N/A Solution Solution Tissue Debrided: Fibrin/Slough, Exudates, Fibrin/Slough, Exudates, N/A Subcutaneous Subcutaneous Level: Skin/Subcutaneous Skin/Subcutaneous N/A Tissue Tissue Debridement Area (sq 7.2 6.48 N/A cm): Instrument: Blade, Forceps Blade, Forceps N/A Bleeding: Minimum Minimum N/A Hemostasis Achieved: Pressure Pressure N/A Procedural Pain: 0 0 N/A Post Procedural Pain: 0 0 N/A Debridement Treatment Procedure was tolerated Procedure was tolerated N/A Response: well well Post Debridement 1.8x8.2x0.4 2.4x2.7x0.2 N/A Measurements L x W x D (cm) Post Debridement 4.637 1.018 N/A Volume: (cm) Post Debridement Category/Stage III Category/Stage II N/A Stage: Periwound Skin Texture: No Abnormalities Noted No Abnormalities Noted No Abnormalities Noted No Abnormalities Noted No Abnormalities Noted No Abnormalities Noted [name], [name] ([id number]) Periwound Skin Moisture: Periwound Skin Color: Erythema: Yes Erythema: Yes Erythema: Yes Ecchymosis: No Erythema Location: Circumferential Circumferential Circumferential Temperature: No Abnormality No Abnormality No Abnormality Tenderness on Yes Yes Yes Palpation: Wound Preparation: Ulcer Cleansing: Ulcer Cleansing: Ulcer Cleansing: Rinsed/Irrigated with Rinsed/Irrigated with Rinsed/Irrigated with Saline Saline Saline Topical Anesthetic Topical Anesthetic Topical Anesthetic Applied: Other: lidocaine Applied: Other: lidocaine Applied: Other: lidocaine 4% 4% 4% Procedures Performed: Debridement Debridement N/A Wound Number: [id number] N/A Photos: N/A Wound Location: Left Lower Leg - Posterior Right Lower Leg - Lateral N/A Wounding Event: Pressure Injury Pressure Injury N/A Primary Etiology:  Pressure Ulcer Pressure Ulcer N/A Comorbid History: Cataracts, Anemia, Cataracts, Anemia, N/A Hypertension, History of Hypertension, History of pressure wounds, pressure wounds, Osteoarthritis, Dementia Osteoarthritis, Dementia Date Acquired: 10/09/2016 12/23/2016 N/A Weeks of Treatment: 4 2 N/A Wound Status: Open Open N/A Clustered Wound: Yes No N/A Clustered Quantity: N/A N/A N/A Pending Amputation on Yes No N/A Presentation: Measurements L x W x D 6x2.3x0.3 0.2x0.2x0.1 N/A (cm) Area (cm) : 10.838 0.031 N/A Volume (cm) : 3.252 0.003 N/A % Reduction in Area: 9.80% 96.60% N/A % Reduction in Volume: 54.90% 96.70% N/A Classification: Category/Stage III Category/Stage II N/A Exudate Amount: Large Medium N/A Exudate Type: Purulent Serosanguineous N/A Exudate Color: yellow, brown, green red, brown N/A [name], [name] ([id number]) Foul Odor After Yes No N/A Cleansing: Odor Anticipated Due to No N/A N/A Product Use: Wound Margin: Distinct, outline attached Distinct, outline attached N/A Granulation Amount: Medium (34-66%) Large (67-100%) N/A Granulation Quality: Red Red N/A Necrotic Amount: Medium (34-66%) None Present (0%) N/A Necrotic Tissue: Eschar, Adherent Slough N/A N/A Epithelialization: None Large (67-100%) N/A Debridement: N/A N/A N/A Pain Control: N/A N/A N/A Tissue Debrided: N/A N/A N/A Level: N/A N/A N/A Debridement Area (sq N/A N/A N/A cm): Instrument: N/A N/A N/A Bleeding: N/A N/A N/A Hemostasis Achieved: N/A N/A N/A Procedural Pain:  N/A N/A N/A Post Procedural Pain: N/A N/A N/A Debridement Treatment N/A N/A N/A Response: Post Debridement N/A N/A N/A Measurements L x W x D (cm) Post Debridement N/A N/A N/A Volume: (cm) Post Debridement N/A N/A N/A Stage: Periwound Skin Texture: No Abnormalities Noted No Abnormalities Noted N/A Periwound Skin No Abnormalities Noted No Abnormalities Noted N/A Moisture: Periwound Skin Color: Erythema: Yes Erythema:  Yes N/A Erythema Location: Circumferential Circumferential N/A Temperature: No Abnormality No Abnormality N/A Tenderness on Yes Yes N/A Palpation: Wound Preparation: Ulcer Cleansing: Ulcer Cleansing: N/A Rinsed/Irrigated with Rinsed/Irrigated with Saline Saline Topical Anesthetic Topical Anesthetic Applied: Other: lidocaine Applied: Other: lidocaine 4% 4% Procedures Performed: N/A N/A N/A Treatment Notes Wound #5 (Right Calcaneus) [name], [name] ([id number]) 1. Cleansed with: Clean wound with Normal Saline Cleanse wound with antibacterial soap and water 2. Anesthetic Topical Lidocaine 4% cream to wound bed prior to debridement 4. Dressing Applied: Iodoflex 5. Secondary Dressing Applied Dry Gauze Foam 7. Secured with Tape Notes heel cup, kerlix, coban Wound #6 (Left Calcaneus) 1. Cleansed with: Clean wound with Normal Saline Cleanse wound with antibacterial soap and water 2. Anesthetic Topical Lidocaine 4% cream to wound bed prior to debridement 4. Dressing Applied: Iodoflex 5. Secondary Dressing Applied Dry Gauze Foam 7. Secured with Tape Notes heel cup, kerlix, coban Wound #7 (Left, Lateral Malleolus) 1. Cleansed with: Clean wound with Normal Saline Cleanse wound with antibacterial soap and water 2. Anesthetic Topical Lidocaine 4% cream to wound bed prior to debridement 4. Dressing Applied: Hydrogel Prisma Ag 5. Secondary Dressing Applied ABD Pad Dry Gauze Foam 7. Secured with Tape [name], [name] ([id number]) Notes kerlix, coban Wound #8 (Left, Posterior Lower Leg) 1. Cleansed with: Clean wound with Normal Saline Cleanse wound with antibacterial soap and water 2. Anesthetic Topical Lidocaine 4% cream to wound bed prior to debridement 4. Dressing Applied: Hydrogel Prisma Ag 5. Secondary Dressing Applied ABD Pad Dry Gauze Foam 7. Secured with Tape Notes kerlix, coban Wound #9 (Right, Lateral Lower Leg) 1. Cleansed  with: Clean wound with Normal Saline Cleanse wound with antibacterial soap and water 2. Anesthetic Topical Lidocaine 4% cream to wound bed prior to debridement 4. Dressing Applied: Hydrogel Prisma Ag 5. Secondary Dressing Applied ABD Pad Dry Gauze Foam 7. Secured with Tape Notes kerlix, coban Electronic Signature(s) Signed: 01/06/2017 5:43:18 PM By: Baltazar Najjar MD Entered By: Baltazar Najjar on 01/06/2017 17:11:56 Campoverde, [name] ([id number]) -------------------------------------------------------------------------------- Multi-Disciplinary Care Plan Details [name] Date of Service: 01/06/2017 2:30 PM Patient Name: [name] Account Number: [id number] Medical Record Treating RN: [name] [id number] Number: Other Clinician: Date of Birth/Sex: 1920/09/09 (83 y.o. Female) Treating [name], [name] Primary Care Alayah Knouff: [name] Kleo Dungee/Extender: G Referring Oreoluwa Aigner: [name] in Treatment: 4 Active Inactive ` Abuse / Safety / Falls / Self Care Management Nursing Diagnoses: Potential for falls Goals: Patient will not experience any injury related to falls Date Initiated: 12/09/2016 Target Resolution Date: 02/20/2017 Goal Status: Active Interventions: Assess fall risk on admission and as needed Notes: ` Nutrition Nursing Diagnoses: Imbalanced nutrition Potential for alteratiion in Nutrition/Potential for imbalanced nutrition Goals: Patient/caregiver agrees to and verbalizes understanding of need to use nutritional supplements and/or vitamins as prescribed Date Initiated: 12/09/2016 Target Resolution Date: 03/27/2017 Goal Status: Active Interventions: Assess patient nutrition upon admission and as needed per policy Notes: ` Orientation to the Wound Care Program HAYLA, HINGER ([id number]) Nursing Diagnoses: Knowledge deficit related to the wound healing center program Goals: Patient/caregiver will verbalize  understanding of the Wound  Healing Center Program Date Initiated: 12/09/2016 Target Resolution Date: 12/26/2016 Goal Status: Active Interventions: Provide education on orientation to the wound center Notes: ` Pain, Acute or Chronic Nursing Diagnoses: Pain, acute or chronic: actual or potential Potential alteration in comfort, pain Goals: Patient/caregiver will verbalize adequate pain control between visits Date Initiated: 12/09/2016 Target Resolution Date: 03/27/2017 Goal Status: Active Interventions: Assess comfort goal upon admission Complete pain assessment as per visit requirements Notes: ` Pressure Nursing Diagnoses: Knowledge deficit related to causes and risk factors for pressure ulcer development Knowledge deficit related to management of pressures ulcers Potential for impaired tissue integrity related to pressure, friction, moisture, and shear Goals: Patient/caregiver will verbalize risk factors for pressure ulcer development Date Initiated: 12/09/2016 Target Resolution Date: 03/27/2017 Goal Status: Active Interventions: Assess: immobility, friction, shearing, incontinence upon admission and as needed [name], ZUMSTEIN. ([id number]) Assess offloading mechanisms upon admission and as needed Notes: ` Wound/Skin Impairment Nursing Diagnoses: Impaired tissue integrity Knowledge deficit related to ulceration/compromised skin integrity Goals: Ulcer/skin breakdown will have a volume reduction of 80% by week 12 Date Initiated: 12/09/2016 Target Resolution Date: 03/20/2017 Goal Status: Active Interventions: Assess patient/caregiver ability to perform ulcer/skin care regimen upon admission and as needed Notes: Electronic Signature(s) Signed: 01/06/2017 5:12:54 PM By: [name] Entered By: [name] on 01/06/2017 15:01:25 Leeb, [name] ([id number]) -------------------------------------------------------------------------------- Pain Assessment  Details [name] Date of Service: 01/06/2017 2:30 PM Patient Name: [name] Account Number: [id number] Medical Record Treating RN: [name] [id number] Number: Other Clinician: Date of Birth/Sex: 03-03-1921 (83 y.o. Female) Treating [name], [name] Primary Care Lamija Besse: [name] Airi Copado/Extender: G Referring Rejoice Heatwole: [name] in Treatment: 4 Active Problems Location of Pain Severity and Description of Pain Patient Has Paino Patient Unable to Respond Site Locations Pain Management and Medication Current Pain Management: Electronic Signature(s) Signed: 01/06/2017 5:03:50 PM By: Elliot Gurney, BSN, RN, CWS, Kim RN, BSN Entered By: Elliot Gurney, BSN, RN, CWS, Kim on 01/06/2017 14:40:34 [name], [name] ([id number]) -------------------------------------------------------------------------------- Patient/Caregiver Education Details [name] Date of Service: 01/06/2017 2:30 PM Patient Name: [name] Account Number: [id number] Medical Record Treating RN: [name] [id number] Number: Other Clinician: 09/29/1920 (83 y.o. Treating [name], [name] Date of Birth/Gender: Female) Physician/Extender: G Primary Care Physician: [name] in Treatment: 4 Referring Physician: [name] Education Assessment Education Provided To: Patient Education Topics Provided Wound/Skin Impairment: Handouts: Other: change dressing as ordered Methods: Demonstration, Explain/Verbal Responses: State content correctly Electronic Signature(s) Signed: 01/06/2017 5:12:54 PM By: [name] Entered By: [name] on 01/06/2017 15:02:05 [name], [name] ([id number]) -------------------------------------------------------------------------------- Wound Assessment Details [name] Date of Service: 01/06/2017 2:30 PM Patient Name: [name] Account Number: [id number] Medical Record Treating RN: [name] [id number] Number: Other Clinician: Date of Birth/Sex: 22-Apr-1921 (83 y.o. Female) Treating [name], [name] Primary Care Sabryn Preslar: [name] Soley Harriss/Extender: G Referring Filomena Pokorney: [name] Weeks in Treatment: 4 Wound Status Wound Number: 5 Primary Pressure Ulcer Etiology: Wound Location: Right Calcaneus Wound Open Wounding Event: Pressure Injury Status: Date Acquired: 10/09/2016 Comorbid Cataracts, Anemia, Hypertension, Weeks Of Treatment: 4 History: History of pressure wounds, Clustered Wound: Yes Osteoarthritis, Dementia Pending Amputation On Presentation Photos Wound Measurements Length: (cm) 1.8 % Reduction in Width: (cm) 8.2 % Reduction in Depth: (cm) 0.4 Epithelializat Clustered Quantity: 2 Tunneling: Area: (cm) 11.592 Undermining: Volume: (cm) 4.637 Area: 75.4% Volume: 1.6% ion: None No No Wound Description Classification: Category/Stage III Foul Odor Afte Wound Margin: Distinct, outline attached Due to Product Exudate Amount: Large Slough/Fibrino Exudate Type: Purulent Exudate Color: yellow, brown,  green r Cleansing: Yes Use: No Yes Wound Bed Granulation Amount: Small (1-33%) [name], [name] ([id number]) Granulation Quality: Red Necrotic Amount: Large (67-100%) Necrotic Quality: Eschar, Adherent Slough Periwound Skin Texture Texture Color No Abnormalities Noted: No No Abnormalities Noted: No Ecchymosis: No Moisture Erythema: Yes No Abnormalities Noted: No Erythema Location: Circumferential Temperature / Pain Temperature: No Abnormality Tenderness on Palpation: Yes Wound Preparation Ulcer Cleansing: Rinsed/Irrigated with Saline Topical Anesthetic Applied: Other: lidocaine 4%, Treatment Notes Wound #5 (Right Calcaneus) 1. Cleansed with: Clean wound with Normal Saline Cleanse wound with antibacterial soap and water 2. Anesthetic Topical Lidocaine 4% cream to wound bed prior to debridement 4. Dressing  Applied: Iodoflex 5. Secondary Dressing Applied Dry Gauze Foam 7. Secured with Tape Notes heel cup, kerlix, coban Electronic Signature(s) Signed: 01/06/2017 5:12:54 PM By: [name] Entered By: [name] on 01/06/2017 16:58:45 [name], [name] ([id number]) -------------------------------------------------------------------------------- Wound Assessment Details [name] Date of Service: 01/06/2017 2:30 PM Patient Name: [name] Account Number: [id number] Medical Record Treating RN: [name] [id number] Number: Other Clinician: Date of Birth/Sex: Nov 30, 1920 (83 y.o. Female) Treating [name], [name] Primary Care Cherise Fedder: [name] Chistian Kasler/Extender: G Referring Ethen Bannan: [name] Weeks in Treatment: 4 Wound Status Wound Number: 6 Primary Pressure Ulcer Etiology: Wound Location: Left Calcaneus Wound Open Wounding Event: Pressure Injury Status: Date Acquired: 10/09/2016 Comorbid Cataracts, Anemia, Hypertension, Weeks Of Treatment: 4 History: History of pressure wounds, Clustered Wound: No Osteoarthritis, Dementia Pending Amputation On Presentation Photos Photo Uploaded By: [name] on 01/06/2017 16:59:36 Wound Measurements Length: (cm) 2.4 Width: (cm) 2.7 Depth: (cm) 0.2 Area: (cm) 5.089 Volume: (cm) 1.018 % Reduction in Area: 69.9% % Reduction in Volume: 39.7% Epithelialization: None Tunneling: No Undermining: No Wound Description Classification: Category/Stage II Foul Odor Afte Wound Margin: Distinct, outline attached Due to Product Exudate Amount: Medium Slough/Fibrino Exudate Type: Purulent Exudate Color: yellow, brown, green r Cleansing: Yes Use: No Yes Wound Bed Granulation Amount: None Present (0%) Necrotic Amount: Large (67-100%) [name], [name] ([id number]) Necrotic Quality: Eschar Periwound Skin Texture Texture Color No Abnormalities Noted: No No Abnormalities Noted: No Erythema:  Yes Moisture Erythema Location: Circumferential No Abnormalities Noted: No Temperature / Pain Temperature: No Abnormality Tenderness on Palpation: Yes Wound Preparation Ulcer Cleansing: Rinsed/Irrigated with Saline Topical Anesthetic Applied: Other: lidocaine 4%, Treatment Notes Wound #6 (Left Calcaneus) 1. Cleansed with: Clean wound with Normal Saline Cleanse wound with antibacterial soap and water 2. Anesthetic Topical Lidocaine 4% cream to wound bed prior to debridement 4. Dressing Applied: Iodoflex 5. Secondary Dressing Applied Dry Gauze Foam 7. Secured with Tape Notes heel cup, kerlix, coban Electronic Signature(s) Signed: 01/06/2017 5:12:54 PM By: [name] Entered By: [name] on 01/06/2017 14:59:46 Krusemark, [name] ([id number]) -------------------------------------------------------------------------------- Wound Assessment Details [name] Date of Service: 01/06/2017 2:30 PM Patient Name: [name] Account Number: [id number] Medical Record Treating RN: [name] [id number] Number: Other Clinician: Date of Birth/Sex: 05-31-1921 (83 y.o. Female) Treating [name], [name] Primary Care Gennett Garcia: [name] Olevia Westervelt/Extender: G Referring Lean Jaeger: [name] Weeks in Treatment: 4 Wound Status Wound Number: 7 Primary Pressure Ulcer Etiology: Wound Location: Left Malleolus - Lateral Wound Open Wounding Event: Pressure Injury Status: Date Acquired: 10/09/2016 Comorbid Cataracts, Anemia, Hypertension, Weeks Of Treatment: 4 History: History of pressure wounds, Clustered Wound: No Osteoarthritis, Dementia Pending Amputation On Presentation Photos Photo Uploaded By: [name] on 01/06/2017 16:57:41 Wound Measurements Length: (cm) 1.5 Width: (cm) 0.8 Depth: (cm) 0.2 Area: (cm) 0.942 Volume: (cm) 0.188 % Reduction in Area: 74% % Reduction in Volume:  74.1% Epithelialization: None Tunneling: No Wound  Description Classification: Category/Stage II Foul Odor Aft Wound Margin: Distinct, outline attached Due to Produc Exudate Amount: Large Slough/Fibrin Exudate Type: Serosanguineous Exudate Color: red, brown er Cleansing: Yes t Use: No o Yes Wound Bed Granulation Amount: Medium (34-66%) Granulation Quality: Red [name], [name] ([id number]) Necrotic Amount: Medium (34-66%) Necrotic Quality: Adherent Slough Periwound Skin Texture Texture Color No Abnormalities Noted: No No Abnormalities Noted: No Erythema: Yes Moisture Erythema Location: Circumferential No Abnormalities Noted: No Temperature / Pain Temperature: No Abnormality Tenderness on Palpation: Yes Wound Preparation Ulcer Cleansing: Rinsed/Irrigated with Saline Topical Anesthetic Applied: Other: lidocaine 4%, Treatment Notes Wound #7 (Left, Lateral Malleolus) 1. Cleansed with: Clean wound with Normal Saline Cleanse wound with antibacterial soap and water 2. Anesthetic Topical Lidocaine 4% cream to wound bed prior to debridement 4. Dressing Applied: Hydrogel Prisma Ag 5. Secondary Dressing Applied ABD Pad Dry Gauze Foam 7. Secured with Tape Notes kerlix, coban Electronic Signature(s) Signed: 01/06/2017 5:12:54 PM By: Alejandro MullingPinkerton, Debra Entered By: Alejandro MullingPinkerton, Debra on 01/06/2017 15:00:05 Sisley, [name] SeatsELIZABETH L. ([id number]) -------------------------------------------------------------------------------- Wound Assessment Details [name] BabeSUMMERS, Alizzon Date of Service: 01/06/2017 2:30 PM Patient Name: [name] Account Number: [id number] Medical Record Treating RN: Phillis Haggisinkerton, Debi [id number] Number: Other Clinician: Date of Birth/Sex: September 09, 1920 (83 y.o. Female) Treating [name], [name] Primary Care Zaria Taha: Antony HasteBADGER, [name] Kaiyon Hynes/Extender: G Referring Kyria Bumgardner: Antony HasteBADGER, [name] Weeks in Treatment: 4 Wound Status Wound Number: 8 Primary Pressure Ulcer Etiology: Wound Location: Left Lower Leg -  Posterior Wound Open Wounding Event: Pressure Injury Status: Date Acquired: 10/09/2016 Comorbid Cataracts, Anemia, Hypertension, Weeks Of Treatment: 4 History: History of pressure wounds, Clustered Wound: Yes Osteoarthritis, Dementia Pending Amputation On Presentation Photos Photo Uploaded By: Alejandro MullingPinkerton, Debra on 01/06/2017 17:00:56 Wound Measurements Length: (cm) 6 Width: (cm) 2.3 Depth: (cm) 0.3 Area: (cm) 10.838 Volume: (cm) 3.252 % Reduction in Area: 9.8% % Reduction in Volume: 54.9% Epithelialization: None Tunneling: No Undermining: No Wound Description Classification: Category/Stage III Foul Odor Aft Wound Margin: Distinct, outline attached Due to Produc Exudate Amount: Large Slough/Fibrin Exudate Type: Purulent Exudate Color: yellow, brown, green er Cleansing: Yes t Use: No o Yes Wound Bed Granulation Amount: Medium (34-66%) Granulation Quality: Red Jacome, Anah L. ([id number]) Necrotic Amount: Medium (34-66%) Necrotic Quality: Eschar, Adherent Slough Periwound Skin Texture Texture Color No Abnormalities Noted: No No Abnormalities Noted: No Erythema: Yes Moisture Erythema Location: Circumferential No Abnormalities Noted: No Temperature / Pain Temperature: No Abnormality Tenderness on Palpation: Yes Wound Preparation Ulcer Cleansing: Rinsed/Irrigated with Saline Topical Anesthetic Applied: Other: lidocaine 4%, Treatment Notes Wound #8 (Left, Posterior Lower Leg) 1. Cleansed with: Clean wound with Normal Saline Cleanse wound with antibacterial soap and water 2. Anesthetic Topical Lidocaine 4% cream to wound bed prior to debridement 4. Dressing Applied: Hydrogel Prisma Ag 5. Secondary Dressing Applied ABD Pad Dry Gauze Foam 7. Secured with Tape Notes kerlix, coban Electronic Signature(s) Signed: 01/06/2017 5:12:54 PM By: Alejandro MullingPinkerton, Debra Entered By: Alejandro MullingPinkerton, Debra on 01/06/2017 15:00:23 Ducharme, [name] SeatsELIZABETH L.  ([id number]) -------------------------------------------------------------------------------- Wound Assessment Details [name] BabeSUMMERS, Eleesha Date of Service: 01/06/2017 2:30 PM Patient Name: [name] Account Number: [id number] Medical Record Treating RN: Phillis Haggisinkerton, Debi [id number] Number: Other Clinician: Date of Birth/Sex: September 09, 1920 (83 y.o. Female) Treating [name], [name] Primary Care Raynold Blankenbaker: Antony HasteBADGER, [name] Khristin Keleher/Extender: G Referring Marsela Kuan: Antony HasteBADGER, [name] Weeks in Treatment: 4 Wound Status Wound Number: 9 Primary Pressure Ulcer Etiology: Wound Location: Right Lower Leg - Lateral Wound Open Wounding Event: Pressure Injury Status: Date Acquired: 12/23/2016 Comorbid Cataracts, Anemia, Hypertension, Weeks Of Treatment: 2 History:  History of pressure wounds, Clustered Wound: No Osteoarthritis, Dementia Photos Photo Uploaded By: [name] on 01/06/2017 17:00:57 Wound Measurements Length: (cm) 0.2 Width: (cm) 0.2 Depth: (cm) 0.1 Area: (cm) 0.031 Volume: (cm) 0.003 % Reduction in Area: 96.6% % Reduction in Volume: 96.7% Epithelialization: Large (67-100%) Tunneling: No Undermining: No Wound Description Classification: Category/Stage II Foul Odor Aft Wound Margin: Distinct, outline attached Slough/Fibrin Exudate Amount: Medium Exudate Type: Serosanguineous Exudate Color: red, brown er Cleansing: No o No Wound Bed Granulation Amount: Large (67-100%) Granulation Quality: Red Vallandingham, Myra L. ([id number]) Necrotic Amount: None Present (0%) Periwound Skin Texture Texture Color No Abnormalities Noted: No No Abnormalities Noted: No Erythema: Yes Moisture Erythema Location: Circumferential No Abnormalities Noted: No Temperature / Pain Temperature: No Abnormality Tenderness on Palpation: Yes Wound Preparation Ulcer Cleansing: Rinsed/Irrigated with Saline Topical Anesthetic Applied: Other: lidocaine 4%, Treatment Notes Wound #9 (Right, Lateral  Lower Leg) 1. Cleansed with: Clean wound with Normal Saline Cleanse wound with antibacterial soap and water 2. Anesthetic Topical Lidocaine 4% cream to wound bed prior to debridement 4. Dressing Applied: Hydrogel Prisma Ag 5. Secondary Dressing Applied ABD Pad Dry Gauze Foam 7. Secured with Tape Notes kerlix, coban Electronic Signature(s) Signed: 01/06/2017 5:12:54 PM By: [name] Entered By: [name] on 01/06/2017 15:00:40 Kunde, [name] ([id number]) -------------------------------------------------------------------------------- Vitals Details [name] Date of Service: 01/06/2017 2:30 PM Patient Name: [name] Account Number: [id number] Medical Record Treating RN: [name] [id number] Number: Other Clinician: Date of Birth/Sex: 1920-09-18 (83 y.o. Female) Treating [name], [name] Primary Care Sheddrick Lattanzio: [name] Devlin Mcveigh/Extender: G Referring Sadao Weyer: [name] Weeks in Treatment: 4 Vital Signs Time Taken: 14:40 Temperature (F): 97.7 Height (in): 69 Pulse (bpm): 96 Weight (lbs): 105 Respiratory Rate (breaths/min): 16 Body Mass Index (BMI): 15.5 Blood Pressure (mmHg): 169/84 Reference Range: 80 - 120 mg / dl Electronic Signature(s) Signed: 01/06/2017 5:03:50 PM By: Elliot Gurney, BSN, RN, CWS, Kim RN, BSN Entered By: Elliot Gurney, BSN, RN, CWS, Kim on 01/06/2017 14:42:56

## 2017-01-08 NOTE — Progress Notes (Signed)
ALMADELIA, LOOMAN ([id number]) Visit Report for 01/06/2017 Chief Complaint Document Details BROOKELYNN, HAMOR Date of Service: 01/06/2017 2:30 PM Patient Name: [name] Account Number: [id number] Medical Record Treating RN: [name] [id number] Number: Other Clinician: [birth year] (83 y.o. Treating [name], [name] Date of Birth/Sex: Female) Provider/Extender: G Primary Care Provider: [name] Referring Provider: [name] in Treatment: 4 Information Obtained from: Patient Chief Complaint Follow-up regarding right lateral calf wounds and right heel pressure injury Electronic Signature(s) Signed: 01/06/2017 5:43:18 PM By: Baltazar Najjar MD Entered By: Baltazar Najjar on 01/06/2017 17:12:19 Mckissic, [name] ([id number]) -------------------------------------------------------------------------------- Debridement Details [name] Date of Service: 01/06/2017 2:30 PM Patient Name: [name] Account Number: [id number] Medical Record Treating RN: [name] [id number] Number: Other Clinician: May 19, 1921 (83 y.o. Treating [name], [name] Date of Birth/Sex: Female) Provider/Extender: G Primary Care Provider: [name] Referring Provider: [name] in Treatment: 4 Debridement Performed for Wound #5 Right Calcaneus Assessment: Performed By: Physician [name], MD Debridement: Debridement Pre-procedure Verification/Time Out Yes - 15:06 Taken: Start Time: 15:10 Pain Control: Lidocaine 4% Topical Solution Level: Skin/Subcutaneous Tissue Total Area Debrided (L x 1.8 (cm) x 4 (cm) = 7.2 (cm) W): Tissue and other Viable, Non-Viable, Exudate, Fibrin/Slough, Subcutaneous material debrided: Instrument: Blade, Forceps Bleeding: Minimum Hemostasis Achieved: Pressure End Time: 15:12 Procedural Pain: 0 Post Procedural Pain: 0 Response to Treatment: Procedure was tolerated well Post Debridement Measurements of  Total Wound Length: (cm) 1.8 Stage: Category/Stage III Width: (cm) 8.2 Depth: (cm) 0.4 Volume: (cm) 4.637 Character of Wound/Ulcer Post Requires Further Debridement: Debridement Post Procedure Diagnosis Same as Pre-procedure Electronic Signature(s) Signed: 01/06/2017 5:12:54 PM By: [name] Signed: 01/06/2017 5:43:18 PM By: Baltazar Najjar MD SVETLANA, BAGBY ([id number]) Entered By: Baltazar Najjar on 01/06/2017 17:12:02 Treece, [name] ([id number]) -------------------------------------------------------------------------------- Debridement Details [name] Date of Service: 01/06/2017 2:30 PM Patient Name: [name] Account Number: [id number] Medical Record Treating RN: [name] [id number] Number: Other Clinician: 1920/12/09 (83 y.o. Treating [name], [name] Date of Birth/Sex: Female) Provider/Extender: G Primary Care Provider: [name] Referring Provider: [name] in Treatment: 4 Debridement Performed for Wound #6 Left Calcaneus Assessment: Performed By: Physician [name], MD Debridement: Debridement Pre-procedure Verification/Time Out Yes - 15:06 Taken: Start Time: 15:07 Pain Control: Lidocaine 4% Topical Solution Level: Skin/Subcutaneous Tissue Total Area Debrided (L x 2.4 (cm) x 2.7 (cm) = 6.48 (cm) W): Tissue and other Viable, Non-Viable, Exudate, Fibrin/Slough, Subcutaneous material debrided: Instrument: Blade, Forceps Bleeding: Minimum Hemostasis Achieved: Pressure End Time: 15:09 Procedural Pain: 0 Post Procedural Pain: 0 Response to Treatment: Procedure was tolerated well Post Debridement Measurements of Total Wound Length: (cm) 2.4 Stage: Category/Stage II Width: (cm) 2.7 Depth: (cm) 0.2 Volume: (cm) 1.018 Character of Wound/Ulcer Post Requires Further Debridement: Debridement Post Procedure Diagnosis Same as Pre-procedure Electronic Signature(s) Signed: 01/06/2017 5:12:54 PM  By: [name] Signed: 01/06/2017 5:43:18 PM By: Baltazar Najjar MD HAYLI, MILLIGAN ([id number]) Entered By: Baltazar Najjar on 01/06/2017 17:12:12 Menge, [name] ([id number]) -------------------------------------------------------------------------------- HPI Details [name] Date of Service: 01/06/2017 2:30 PM Patient Name: [name] Account Number: [id number] Medical Record Treating RN: [name] [id number] Number: Other Clinician: 27-Sep-1920 (83 y.o. Treating [name], [name] Date of Birth/Sex: Female) Provider/Extender: G Primary Care Provider: [name] Referring Provider: [name] in Treatment: 4 History of Present Illness HPI Description: 03/24/16; this is an elderly 83 year old woman with advanced dementia who was hospitalized from 5/8 through 5/13. At that point she had Proteus sepsis felt to be secondary to  a UTI the. Acute kidney injury and delirium. Her son who is her primary caregiver says she came home from the hospital with wounds on her right lower extremity and apparently her right buttock as well. There is no mention on the hospital discharge summary of problems. She has been having [facility] and have been using silver alginate based dressings. Apparently the area on the right buttock is healing and the son stated we did not need to become involved with that. In any case the patient has for wounds to on the right heel and 2 on the posterior lateral right calf, the latter of which is not a usual pressure area however that is the history. She is apparently eating and drinking fairly well. Takes ensure well. She does not have any pressure-relief surface at home. I have reviewed her lab work from the hospital. Her admission albumin was 3.4 on 5/8 04/01/16; patient arrives with wounds looking much the same as last week. She has an extensive pressure area over her right heel and to small but deep wounds on the lateral  aspect of her right lower leg. These wounds are not connected. Although there are advertised his pressure ulcers these 2 wounds are not usual for pressure ulcerations. We have not looked at the pressure ulceration on her buttock at the request of the patient's son who is the primary caregiver 04/08/16; wounds are stable to improved this week. Culture I did of the lower wound on her right lateral leg grew methicillin sensitive staph aureus she is on Septra. We are using silver alginate to all the wounds. 04/15/16; the 2 small areas on the lateral aspect of this lady's right leg continued to improve however the heel is once again covered with callus, residual alginate, acrotic material all of which requires a difficult debridement. There continues to be purulent material under this surface. This is in spite of a week of Septra that I gave him for MSSA 04/22/16 Patient today presents for follow-up evaluation. She is seen with her son in the office at this point in time she does have Alzheimer's. The good news is her wounds appeared to be somewhat smaller with current therapy. At this point in time the infection which was cultured previously appears to have improved in regard to the right heel wound. There is no purulent drainage at this point in time and a good portion of the wound actually is eschar covered and dry with a medial portion actually open to granulation with very little slough covering. She really has no significant discomfort with palpation manipulation of the wound at this point in time. 04/28/16 patient presents today for follow-up evaluation concerning her right lateral calf wound as well as right heel wound. she has not really been complaining this for as pain is concerned according to her son seen with her in the office at this point in time today. She seems to show no interval signs or symptoms of infection overall has been tolerating the dressing changes well. She does have  Alzheimer's therefore is not able to rate her pain although she does respond with an affirmative that she hurts when I press over the Maish, Khia L. ([id number]) heel region. 05/05/16; the area on the lateral aspect of her right calf is healed. The right heel as 2 small spots that are still open and very close to resolving as well using Aquacel Ag under border foam, 05/20/16; the area on the lateral aspect of her right calf remains  closed. The 2 small areas on her right heel are also closed. READMISSION 12/09/16; this is a 83 year old woman with advanced dementia that we cared for in the clinic from September to the beginning of November 2017. At that point she had a right heel, right lateral calf and right buttock wound all felt to be secondary to pressure. These eventually closed over. The patient's son who is the primary caregiver states that over the last 2 months she is developed bilateral lower extremity pressure ulcers. [facility] home and they have been applying Medihoney and an alginate. These were apparently all pressure ulcers today including area on the left posterior calf. Apparently they were keeping her leg elevated on the pillow which contributed to this. The patient has advanced dementia, is nonambulatory and not able to move herself in bed. There is apparently not a nutritional issue. She has [facility]. 12/16/16; patient was readmitted to the clinic last week with multiple wounds including the medial and lateral aspects of the left calcaneus left posterior calf right calcaneus. We've been using Iodoflex, dressing change by home health. Her son also reports that she has had trouble breathing which she attributes to the low air loss mattress we have for pressure relief. He states that when he took her out of bed and put her in her chair breathing improved. As of Monday he took the low air loss mattress off the bed and states that she is able to  go to bed without shortness of breath. His description sounded a lot like orthopnea. He is asking [place] to order a gel mattress overlay. According to her son she does not aspirate at least not frequently 12/23/16; the patient is reviewed today for multiple wounds on her lower extremities. We have been using Iodoflex dressing change by home health. Last week the patient was Cheyne-Stokes in with an x-ray that look like heart failure with small bilateral pleural effusions. I gave her Lasix and some potassium. She saw her primary physician earlier this week who did not add anything to this. Her son states she is doing better she has not had any of the orthopneic episodes since Monday 01/06/17; 3 week followup. using iodoflex. She has a large wound on the left lateral calf with exposed tendon, and necrotic wound over the left medial heel. Wound over the left lateral malleolus appears to be improved. On the right side necrotic wound on the right medial heel, wound on the right lateral heel looks somewhat better and the area on the left anterior lateral calf looks as though it's progressing towards closure. The patient may have arterial issues however she is too far along in terms of her dementia to undergo testing. Her son claims she is eating well and there offloading this. Electronic Signature(s) Signed: 01/06/2017 5:43:18 PM By: Baltazar Najjar MD Entered By: Baltazar Najjar on 01/06/2017 17:29:10 Abdo, [name] ([id number]) -------------------------------------------------------------------------------- Physical Exam Details [name] Date of Service: 01/06/2017 2:30 PM Patient Name: [name] Account Number: [id number] Medical Record Treating RN: [name] [id number] Number: Other Clinician: 08-23-1920 (83 y.o. Treating [name], [name] Date of Birth/Sex: Female) Provider/Extender: G Primary Care Provider: [name] Referring Provider: [name] Weeks in  Treatment: 4 Constitutional Patient is hypertensive.. Pulse regular and within target range for patient.Marland Kitchen Respirations regular, non-labored and within target range.. Temperature is normal and within the target range for the patient.Marland Kitchen appears in no distress. Cardiovascular Pedal pulses. Psychiatric Patient appears depressed today.. Notes Wound exam;  oOn the left side on the lateral left leg is a deep wound with exposed tendon. Left lateral malleolus superficial but tender small open area. Left medial heel is a deep necrotic wound which I attempted to debridement with pickups and a scalpel although I still cannot get down to a viable surface. Debridement is not easy on this unfortunate patient oOn the right side a necrotic wound on the right medial heel again debridement with pickups and a #5 curet. She tolerates this reasonably poorly. There is bleeding. Hemostasis with direct pressure on the right lateral heel is a cleaner looking wound with granulation. Electronic Signature(s) Signed: 01/06/2017 5:43:18 PM By: Baltazar Najjar MD Entered By: Baltazar Najjar on 01/06/2017 17:31:10 Dorrance, [name] ([id number]) -------------------------------------------------------------------------------- Physician Orders Details [name] Date of Service: 01/06/2017 2:30 PM Patient Name: [name] Account Number: [id number] Medical Record Treating RN: [name] [id number] Number: Other Clinician: May 25, 1921 (83 y.o. Treating [name], [name] Date of Birth/Sex: Female) Provider/Extender: G Primary Care Provider: [name] Referring Provider: [name] in Treatment: 4 Verbal / Phone Orders: Yes Clinician: Pinkerton, Debi Read Back and Verified: Yes Diagnosis Coding Wound Cleansing Wound #5 Right Calcaneus o Clean wound with Normal Saline. o Cleanse wound with mild soap and water Wound #6 Left Calcaneus o Clean wound with Normal Saline. o Cleanse  wound with mild soap and water Wound #7 Left,Lateral Malleolus o Clean wound with Normal Saline. o Cleanse wound with mild soap and water Wound #8 Left,Posterior Lower Leg o Clean wound with Normal Saline. o Cleanse wound with mild soap and water Wound #9 Right,Lateral Lower Leg o Clean wound with Normal Saline. o Cleanse wound with mild soap and water Anesthetic Wound #5 Right Calcaneus o Topical Lidocaine 4% cream applied to wound bed prior to debridement - for clinic use Wound #6 Left Calcaneus o Topical Lidocaine 4% cream applied to wound bed prior to debridement Wound #7 Left,Lateral Malleolus o Topical Lidocaine 4% cream applied to wound bed prior to debridement - for clinic use Wound #8 Left,Posterior Lower Leg o Topical Lidocaine 4% cream applied to wound bed prior to debridement - for clinic use Wound #9 Right,Lateral Lower Leg ZETTA, STONEMAN. ([id number]) o Topical Lidocaine 4% cream applied to wound bed prior to debridement - for clinic use Primary Wound Dressing Wound #5 Right Calcaneus o Iodoflex Wound #6 Left Calcaneus o Iodoflex Wound #7 Left,Lateral Malleolus o Prisma Ag - moisten with hydrogel Wound #8 Left,Posterior Lower Leg o Prisma Ag - moisten with hydrogel Wound #9 Right,Lateral Lower Leg o Prisma Ag - moisten with hydrogel Secondary Dressing Wound #5 Right Calcaneus o Dry Gauze o Other - Allevyn heel cup Wound #6 Left Calcaneus o Dry Gauze o Other - Allevyn heel cup Wound #7 Left,Lateral Malleolus o ABD pad o Dry Gauze o Foam - non adhesive foam Wound #8 Left,Posterior Lower Leg o ABD pad o Dry Gauze o Foam - non adhesive foam Wound #9 Right,Lateral Lower Leg o ABD pad o Dry Gauze o Foam - non adhesive foam Dressing Change Frequency Wound #5 Right Calcaneus o Change Dressing Monday, Wednesday, Friday - HHRN to also change wraps and dressing on 12/30/16 next week Wound #6  Left Calcaneus Ferryman, Zandra L. ([id number]) o Change Dressing Monday, Wednesday, Friday - HHRN to also change wraps and dressing on 12/30/16 next week Wound #7 Left,Lateral Malleolus o Change Dressing Monday, Wednesday, Friday - HHRN to also change wraps and dressing on 12/30/16 next week Wound #8 Left,Posterior Lower Leg   o Change Dressing Monday, Wednesday, Friday - HHRN to also change wraps and dressing on 12/30/16 next week Wound #9 Right,Lateral Lower Leg o Change Dressing Monday, Wednesday, Friday - HHRN to also change wraps and dressing on 12/30/16 next week Follow-up Appointments Wound #5 Right Calcaneus o Return Appointment in 2 weeks. Wound #6 Left Calcaneus o Return Appointment in 2 weeks. Wound #7 Left,Lateral Malleolus o Return Appointment in 2 weeks. Wound #8 Left,Posterior Lower Leg o Return Appointment in 2 weeks. Wound #9 Right,Lateral Lower Leg o Return Appointment in 2 weeks. Edema Control Wound #5 Right Calcaneus o Kerlix and Coban - Bilateral - lightly wrapped from toes to 3 cm below the knee Wound #6 Left Calcaneus o Kerlix and Coban - Bilateral - lightly wrapped from toes to 3 cm below the knee Wound #7 Left,Lateral Malleolus o Kerlix and Coban - Bilateral - lightly wrapped from toes to 3 cm below the knee Wound #8 Left,Posterior Lower Leg o Kerlix and Coban - Bilateral - lightly wrapped from toes to 3 cm below the knee Wound #9 Right,Lateral Lower Leg o Kerlix and Coban - Bilateral - lightly wrapped from toes to 3 cm below the knee Off-Loading Tursi, Glady L. ([id number]) Wound #9 Right,Lateral Lower Leg o Mattress o Turn and reposition every 2 hours Additional Orders / Instructions Wound #5 Right Calcaneus o Increase protein intake. o Other: - Please add vitamin A, vitamin C and zinc supplements to your diet Wound #6 Left Calcaneus o Increase protein intake. o Other: - Please add vitamin A, vitamin C  and zinc supplements to your diet Wound #7 Left,Lateral Malleolus o Increase protein intake. o Other: - Please add vitamin A, vitamin C and zinc supplements to your diet Wound #8 Left,Posterior Lower Leg o Increase protein intake. o Other: - Please add vitamin A, vitamin C and zinc supplements to your diet Wound #9 Right,Lateral Lower Leg o Increase protein intake. o Other: - Please add vitamin A, vitamin C and zinc supplements to your diet Home Health Wound #5 Right Calcaneus o [facility] - [facility] o Home Health Nurse may visit PRN to address patientos wound care needs. o FACE TO FACE ENCOUNTER: MEDICARE and MEDICAID PATIENTS: I certify that this patient is under my care and that I had a face-to-face encounter that meets the physician face-to-face encounter requirements with this patient on this date. The encounter with the patient was in whole or in part for the following MEDICAL CONDITION: (primary reason for Home Healthcare) MEDICAL NECESSITY: I certify, that based on my findings, NURSING services are a medically necessary home health service. HOME BOUND STATUS: I certify that my clinical findings support that this patient is homebound (i.e., Due to illness or injury, pt requires aid of supportive devices such as crutches, cane, wheelchairs, walkers, the use of special transportation or the assistance of another person to leave their place of residence. There is a normal inability to leave the home and doing so requires considerable and taxing effort. Other absences are for medical reasons / religious services and are infrequent or of short duration when for other reasons). o If current dressing causes regression in wound condition, may D/C ordered dressing product/s and apply Normal Saline Moist Dressing daily until next Wound Healing Center / Other MD appointment. Notify Wound Healing Center of regression in wound condition at [phone]. o  Please direct any NON-WOUND related issues/requests for orders to patient's Primary Care Physician Wound #6 Left Calcaneus o Continue Home Health Visits -  [address], [place] ([id number]) o Home Health Nurse may visit PRN to address patientos wound care needs. o FACE TO FACE ENCOUNTER: MEDICARE and MEDICAID PATIENTS: I certify that this patient is under my care and that I had a face-to-face encounter that meets the physician face-to-face encounter requirements with this patient on this date. The encounter with the patient was in whole or in part for the following MEDICAL CONDITION: (primary reason for Home Healthcare) MEDICAL NECESSITY: I certify, that based on my findings, NURSING services are a medically necessary home health service. HOME BOUND STATUS: I certify that my clinical findings support that this patient is homebound (i.e., Due to illness or injury, pt requires aid of supportive devices such as crutches, cane, wheelchairs, walkers, the use of special transportation or the assistance of another person to leave their place of residence. There is a normal inability to leave the home and doing so requires considerable and taxing effort. Other absences are for medical reasons / religious services and are infrequent or of short duration when for other reasons). o If current dressing causes regression in wound condition, may D/C ordered dressing product/s and apply Normal Saline Moist Dressing daily until next Wound Healing Center / Other MD appointment. Notify Wound Healing Center of regression in wound condition at [phone]. o Please direct any NON-WOUND related issues/requests for orders to patient's Primary Care Physician Wound #7 Left,Lateral Malleolus o [facility] - [facility] o Home Health Nurse may visit PRN to address patientos wound care needs. o FACE TO FACE ENCOUNTER: MEDICARE and MEDICAID PATIENTS: I certify that this patient  is under my care and that I had a face-to-face encounter that meets the physician face-to-face encounter requirements with this patient on this date. The encounter with the patient was in whole or in part for the following MEDICAL CONDITION: (primary reason for Home Healthcare) MEDICAL NECESSITY: I certify, that based on my findings, NURSING services are a medically necessary home health service. HOME BOUND STATUS: I certify that my clinical findings support that this patient is homebound (i.e., Due to illness or injury, pt requires aid of supportive devices such as crutches, cane, wheelchairs, walkers, the use of special transportation or the assistance of another person to leave their place of residence. There is a normal inability to leave the home and doing so requires considerable and taxing effort. Other absences are for medical reasons / religious services and are infrequent or of short duration when for other reasons). o If current dressing causes regression in wound condition, may D/C ordered dressing product/s and apply Normal Saline Moist Dressing daily until next Wound Healing Center / Other MD appointment. Notify Wound Healing Center of regression in wound condition at [phone]. o Please direct any NON-WOUND related issues/requests for orders to patient's Primary Care Physician Wound #8 Left,Posterior Lower Leg o [facility] - [facility] o Home Health Nurse may visit PRN to address patientos wound care needs. o FACE TO FACE ENCOUNTER: MEDICARE and MEDICAID PATIENTS: I certify that this patient is under my care and that I had a face-to-face encounter that meets the physician face-to-face encounter requirements with this patient on this date. The encounter with the patient was in whole or in part for the following MEDICAL CONDITION: (primary reason for Home Healthcare) MEDICAL NECESSITY: I certify, that based on my findings, NURSING services are a  medically necessary home health service. HOME BOUND STATUS: I certify that my clinical findings support that this patient is homebound (i.e.,  Due to illness or injury, pt requires aid of [employer], [employer]. ([id number]) supportive devices such as crutches, cane, wheelchairs, walkers, the use of special transportation or the assistance of another person to leave their place of residence. There is a normal inability to leave the home and doing so requires considerable and taxing effort. Other absences are for medical reasons / religious services and are infrequent or of short duration when for other reasons). o If current dressing causes regression in wound condition, may D/C ordered dressing product/s and apply Normal Saline Moist Dressing daily until next Wound Healing Center / Other MD appointment. Notify Wound Healing Center of regression in wound condition at [phone]. o Please direct any NON-WOUND related issues/requests for orders to patient's Primary Care Physician Wound #9 Right,Lateral Lower Leg o [facility] - [facility] o Home Health Nurse may visit PRN to address patientos wound care needs. o FACE TO FACE ENCOUNTER: MEDICARE and MEDICAID PATIENTS: I certify that this patient is under my care and that I had a face-to-face encounter that meets the physician face-to-face encounter requirements with this patient on this date. The encounter with the patient was in whole or in part for the following MEDICAL CONDITION: (primary reason for Home Healthcare) MEDICAL NECESSITY: I certify, that based on my findings, NURSING services are a medically necessary home health service. HOME BOUND STATUS: I certify that my clinical findings support that this patient is homebound (i.e., Due to illness or injury, pt requires aid of supportive devices such as crutches, cane, wheelchairs, walkers, the use of special transportation or the assistance of another person to leave  their place of residence. There is a normal inability to leave the home and doing so requires considerable and taxing effort. Other absences are for medical reasons / religious services and are infrequent or of short duration when for other reasons). o If current dressing causes regression in wound condition, may D/C ordered dressing product/s and apply Normal Saline Moist Dressing daily until next Wound Healing Center / Other MD appointment. Notify Wound Healing Center of regression in wound condition at [phone]. o Please direct any NON-WOUND related issues/requests for orders to patient's Primary Care Physician Electronic Signature(s) Signed: 01/06/2017 5:12:54 PM By: [name] Signed: 01/06/2017 5:43:18 PM By: Baltazar Najjar MD Entered By: [name] on 01/06/2017 15:13:32 Longoria, [name] ([id number]) -------------------------------------------------------------------------------- Problem List Details [name] Date of Service: 01/06/2017 2:30 PM Patient Name: [name] Account Number: [id number] Medical Record Treating RN: [name] [id number] Number: Other Clinician: 1920-11-29 (83 y.o. 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 Account Number: [id number] Medical Record Treating RN: [name] [id number] Number: Other Clinician: February 08, 1921 (83 y.o. Treating [name], [name] Date of Birth/Sex: Female) Provider/Extender: G Primary Care Provider: [name] Referring Provider: [name] in Treatment: 4 Subjective Chief Complaint  Information obtained from Patient Follow-up regarding right lateral calf wounds and right heel pressure injury History of Present Illness (HPI) 03/24/16; this is an elderly 83 year old woman with advanced dementia who was hospitalized from 5/8 through 5/13. At that point she had Proteus sepsis felt to be secondary to a UTI the. Acute kidney injury and delirium. Her son who is her primary caregiver says she came home from the hospital with wounds on her right lower extremity and apparently her right buttock as well. There is no mention on the hospital discharge summary of problems. She has been having [facility] and have been using silver alginate based dressings. Apparently the area on the right buttock is healing and the son stated we did not need to become involved with that. In any case the patient has for wounds to on the right heel and 2 on the posterior lateral right calf, the latter of which is not a usual pressure area however that is the history. She is apparently eating and drinking fairly well. Takes ensure well. She does not have any pressure-relief surface at home. I have reviewed her lab work from the hospital. Her admission albumin was 3.4 on 5/8 04/01/16; patient arrives with wounds looking much the same as last week. She has an extensive pressure area over her right heel and to small but deep wounds on the lateral aspect of her right lower leg.  These wounds are not connected. Although there are advertised his pressure ulcers these 2 wounds are not usual for pressure ulcerations. We have not looked at the pressure ulceration on her buttock at the request of the patient's son who is the primary caregiver 04/08/16; wounds are stable to improved this week. Culture I did of the lower wound on her right lateral leg grew methicillin sensitive staph aureus she is on Septra. We are using silver alginate to all the wounds. 04/15/16; the 2 small areas on the lateral aspect of this lady's right leg continued to improve however the heel is once again covered with callus, residual alginate, acrotic material all of which requires a difficult debridement. There continues to be purulent material under this surface. This is in spite of a week of Septra that I gave him for MSSA 04/22/16 Patient today presents for follow-up evaluation. She is seen with her son in the office at this point in time she does have Alzheimer's. The good news is her wounds appeared to be somewhat smaller with current therapy. At this point in time the infection which was cultured previously appears to have improved in regard to the right heel wound. There is no purulent drainage at this point in time and a good portion of the wound actually is eschar covered and dry with a medial portion actually open to granulation with very little slough covering. She really has no significant discomfort with palpation manipulation of the wound at this point in time. SUMMERLYN, FICKEL ([id number]) 04/28/16 patient presents today for follow-up evaluation concerning her right lateral calf wound as well as right heel wound. she has not really been complaining this for as pain is concerned according to her son seen with her in the office at this point in time today. She seems to show no interval signs or symptoms of infection overall has been tolerating the dressing changes well. She does have  Alzheimer's therefore is not able to rate her pain although she does respond with an affirmative that she hurts when I press over the heel region.  05/05/16; the area on the lateral aspect of her right calf is healed. The right heel as 2 small spots that are still open and very close to resolving as well using Aquacel Ag under border foam, 05/20/16; the area on the lateral aspect of her right calf remains closed. The 2 small areas on her right heel are also closed. READMISSION 12/09/16; this is a 83 year old woman with advanced dementia that we cared for in the clinic from September to the beginning of November 2017. At that point she had a right heel, right lateral calf and right buttock wound all felt to be secondary to pressure. These eventually closed over. The patient's son who is the primary caregiver states that over the last 2 months she is developed bilateral lower extremity pressure ulcers. [facility] and they have been applying Medihoney and an alginate. These were apparently all pressure ulcers today including area on the left posterior calf. Apparently they were keeping her leg elevated on the pillow which contributed to this. The patient has advanced dementia, is nonambulatory and not able to move herself in bed. There is apparently not a nutritional issue. [facility] has [facility]. 12/16/16; patient was readmitted to the clinic last week with multiple wounds including the medial and lateral aspects of the left calcaneus left posterior calf right calcaneus. We've been using Iodoflex, dressing change by home health. Her son also reports that she has had trouble breathing which she attributes to the low air loss mattress we have for pressure relief. He states that when he took her out of bed and put her in her chair breathing improved. As of Monday he took the low air loss mattress off the bed and states that she is able to go to bed without shortness of  breath. His description sounded a lot like orthopnea. He is asking [place] to order a gel mattress overlay. According to her son she does not aspirate at least not frequently 12/23/16; the patient is reviewed today for multiple wounds on her lower extremities. We have been using Iodoflex dressing change by home health. Last week the patient was Cheyne-Stokes in with an x-ray that look like heart failure with small bilateral pleural effusions. I gave her Lasix and some potassium. She saw her primary physician earlier this week who did not add anything to this. Her son states she is doing better she has not had any of the orthopneic episodes since Monday 01/06/17; 3 week followup. using iodoflex. She has a large wound on the left lateral calf with exposed tendon, and necrotic wound over the left medial heel. Wound over the left lateral malleolus appears to be improved. On the right side necrotic wound on the right medial heel, wound on the right lateral heel looks somewhat better and the area on the left anterior lateral calf looks as though it's progressing towards closure. The patient may have arterial issues however she is too far along in terms of her dementia to undergo testing. Her son claims she is eating well and there offloading this. FAREN, FLORENCE ([id number]) Objective Constitutional Patient is hypertensive.. Pulse regular and within target range for patient.Marland Kitchen Respirations regular, non-labored and within target range.. Temperature is normal and within the target range for the patient.Marland Kitchen appears in no distress. Vitals Time Taken: 2:40 PM, Height: 69 in, Weight: 105 lbs, BMI: 15.5, Temperature: 97.7 F, Pulse: 96 bpm, Respiratory Rate: 16 breaths/min, Blood Pressure: 169/84 mmHg. Cardiovascular Pedal pulses. Psychiatric Patient appears  depressed today.. General Notes: Wound exam; On the left side on the lateral left leg is a deep wound with exposed tendon. Left lateral malleolus  superficial but tender small open area. Left medial heel is a deep necrotic wound which I attempted to debridement with pickups and a scalpel although I still cannot get down to a viable surface. Debridement is not easy on this unfortunate patient On the right side a necrotic wound on the right medial heel again debridement with pickups and a #5 curet. She tolerates this reasonably poorly. There is bleeding. Hemostasis with direct pressure on the right lateral heel is a cleaner looking wound with granulation. Integumentary (Hair, Skin) Wound #5 status is Open. Original cause of wound was Pressure Injury. The wound is located on the Right Calcaneus. The wound measures 1.8cm length x 8.2cm width x 0.4cm depth; 11.592cm^2 area and 4.637cm^3 volume. There is no tunneling or undermining noted. There is a large amount of purulent drainage noted. The wound margin is distinct with the outline attached to the wound base. There is small (1-33%) red granulation within the wound bed. There is a large (67-100%) amount of necrotic tissue within the wound bed including Eschar and Adherent Slough. The periwound skin appearance exhibited: Erythema. The periwound skin appearance did not exhibit: Ecchymosis. The surrounding wound skin color is noted with erythema which is circumferential. Periwound temperature was noted as No Abnormality. The periwound has tenderness on palpation. Wound #6 status is Open. Original cause of wound was Pressure Injury. The wound is located on the Left Calcaneus. The wound measures 2.4cm length x 2.7cm width x 0.2cm depth; 5.089cm^2 area and 1.018cm^3 volume. There is no tunneling or undermining noted. There is a medium amount of purulent drainage noted. The wound margin is distinct with the outline attached to the wound base. There is no granulation within the wound bed. There is a large (67-100%) amount of necrotic tissue within the wound bed including Eschar. The periwound skin  appearance exhibited: Erythema. The surrounding wound skin color is noted with erythema which is circumferential. Periwound temperature was noted as No Abnormality. The periwound has tenderness on palpation. Wound #7 status is Open. Original cause of wound was Pressure Injury. The wound is located on the Gordon. ([id number]) Left,Lateral Malleolus. The wound measures 1.5cm length x 0.8cm width x 0.2cm depth; 0.942cm^2 area and 0.188cm^3 volume. There is no tunneling noted. There is a large amount of serosanguineous drainage noted. The wound margin is distinct with the outline attached to the wound base. There is medium (34-66%) red granulation within the wound bed. There is a medium (34-66%) amount of necrotic tissue within the wound bed including Adherent Slough. The periwound skin appearance exhibited: Erythema. The surrounding wound skin color is noted with erythema which is circumferential. Periwound temperature was noted as No Abnormality. The periwound has tenderness on palpation. Wound #8 status is Open. Original cause of wound was Pressure Injury. The wound is located on the Left,Posterior Lower Leg. The wound measures 6cm length x 2.3cm width x 0.3cm depth; 10.838cm^2 area and 3.252cm^3 volume. There is no tunneling or undermining noted. There is a large amount of purulent drainage noted. The wound margin is distinct with the outline attached to the wound base. There is medium (34-66%) red granulation within the wound bed. There is a medium (34-66%) amount of necrotic tissue within the wound bed including Eschar and Adherent Slough. The periwound skin appearance exhibited: Erythema. The surrounding wound skin color is noted with erythema  which is circumferential. Periwound temperature was noted as No Abnormality. The periwound has tenderness on palpation. Wound #9 status is Open. Original cause of wound was Pressure Injury. The wound is located on the Right,Lateral Lower  Leg. The wound measures 0.2cm length x 0.2cm width x 0.1cm depth; 0.031cm^2 area and 0.003cm^3 volume. There is no tunneling or undermining noted. There is a medium amount of serosanguineous drainage noted. The wound margin is distinct with the outline attached to the wound base. There is large (67-100%) red granulation within the wound bed. There is no necrotic tissue within the wound bed. The periwound skin appearance exhibited: Erythema. The surrounding wound skin color is noted with erythema which is circumferential. Periwound temperature was noted as No Abnormality. The periwound has tenderness on palpation. 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 There was a Skin/Subcutaneous Tissue Debridement ([id number]) debridement with total area of 7.2 sq cm performed Hayman, Monchel L. ([id number]) by [name], MD. with the following instrument(s): Blade and Forceps to remove Viable and Non-Viable tissue/material including Exudate, Fibrin/Slough, and Subcutaneous after achieving pain control using Lidocaine 4% Topical Solution. A time out was conducted at 15:06, prior to the start of the procedure. A Minimum amount of bleeding was controlled with Pressure. The procedure was tolerated well with a pain level of 0 throughout and a pain level of 0 following the procedure. Post Debridement Measurements: 1.8cm length x 8.2cm width x 0.4cm depth; 4.637cm^3 volume. Post debridement Stage noted as Category/Stage III. Character of Wound/Ulcer Post Debridement requires further debridement. Post procedure Diagnosis Wound #5: Same as  Pre-Procedure Wound #6 Pre-procedure diagnosis of Wound #6 is a Pressure Ulcer located on the Left Calcaneus . There was a Skin/Subcutaneous Tissue Debridement ([id number]) debridement with total area of 6.48 sq cm performed by [name], MD. with the following instrument(s): Blade and Forceps to remove Viable and Non-Viable tissue/material including Exudate, Fibrin/Slough, and Subcutaneous after achieving pain control using Lidocaine 4% Topical Solution. A time out was conducted at 15:06, prior to the start of the procedure. A Minimum amount of bleeding was controlled with Pressure. The procedure was tolerated well with a pain level of 0 throughout and a pain level of 0 following the procedure. Post Debridement Measurements: 2.4cm length x 2.7cm width x 0.2cm depth; 1.018cm^3 volume. Post debridement Stage noted as Category/Stage II. Character of Wound/Ulcer Post Debridement requires further debridement. Post procedure Diagnosis Wound #6: Same as Pre-Procedure Plan Wound Cleansing: Wound #5 Right Calcaneus: Clean wound with Normal Saline. Cleanse wound with mild soap and water Wound #6 Left Calcaneus: Clean wound with Normal Saline. Cleanse wound with mild soap and water Wound #7 Left,Lateral Malleolus: Clean wound with Normal Saline. Cleanse wound with mild soap and water Wound #8 Left,Posterior Lower Leg: Clean wound with Normal Saline. Cleanse wound with mild soap and water Wound #9 Right,Lateral Lower Leg: Clean wound with Normal Saline. Cleanse wound with mild soap and water Anesthetic: Wound #5 Right Calcaneus: Topical Lidocaine 4% cream applied to wound bed prior to debridement - for clinic use ALANE, HANSSEN. ([id number]) Wound #6 Left Calcaneus: Topical Lidocaine 4% cream applied to wound bed  prior to debridement Wound #7 Left,Lateral Malleolus: Topical Lidocaine 4% cream applied to wound bed prior to debridement - for clinic use Wound #8 Left,Posterior  Lower Leg: Topical Lidocaine 4% cream applied to wound bed prior to debridement - for clinic use Wound #9 Right,Lateral Lower Leg: Topical Lidocaine 4% cream applied to wound bed prior to debridement - for clinic use Primary Wound Dressing: Wound #5 Right Calcaneus: Iodoflex Wound #6 Left Calcaneus: Iodoflex Wound #7 Left,Lateral Malleolus: Prisma Ag - moisten with hydrogel Wound #8 Left,Posterior Lower Leg: Prisma Ag - moisten with hydrogel Wound #9 Right,Lateral Lower Leg: Prisma Ag - moisten with hydrogel Secondary Dressing: Wound #5 Right Calcaneus: Dry Gauze Other - Allevyn heel cup Wound #6 Left Calcaneus: Dry Gauze Other - Allevyn heel cup Wound #7 Left,Lateral Malleolus: ABD pad Dry Gauze Foam - non adhesive foam Wound #8 Left,Posterior Lower Leg: ABD pad Dry Gauze Foam - non adhesive foam Wound #9 Right,Lateral Lower Leg: ABD pad Dry Gauze Foam - non adhesive foam Dressing Change Frequency: Wound #5 Right Calcaneus: Change Dressing Monday, Wednesday, Friday - HHRN to also change wraps and dressing on 12/30/16 next week Wound #6 Left Calcaneus: Change Dressing Monday, Wednesday, Friday - HHRN to also change wraps and dressing on 12/30/16 next week Wound #7 Left,Lateral Malleolus: Change Dressing Monday, Wednesday, Friday - HHRN to also change wraps and dressing on 12/30/16 next week Wound #8 Left,Posterior Lower Leg: Change Dressing Monday, Wednesday, Friday - HHRN to also change wraps and dressing on 12/30/16 next week CLOA, BUSHONG L. ([id number]) Wound #9 Right,Lateral Lower Leg: Change Dressing Monday, Wednesday, Friday - HHRN to also change wraps and dressing on 12/30/16 next week Follow-up Appointments: Wound #5 Right Calcaneus: Return Appointment in 2 weeks. Wound #6 Left Calcaneus: Return Appointment in 2 weeks. Wound #7 Left,Lateral Malleolus: Return Appointment in 2 weeks. Wound #8 Left,Posterior Lower Leg: Return Appointment in 2  weeks. Wound #9 Right,Lateral Lower Leg: Return Appointment in 2 weeks. Edema Control: Wound #5 Right Calcaneus: Kerlix and Coban - Bilateral - lightly wrapped from toes to 3 cm below the knee Wound #6 Left Calcaneus: Kerlix and Coban - Bilateral - lightly wrapped from toes to 3 cm below the knee Wound #7 Left,Lateral Malleolus: Kerlix and Coban - Bilateral - lightly wrapped from toes to 3 cm below the knee Wound #8 Left,Posterior Lower Leg: Kerlix and Coban - Bilateral - lightly wrapped from toes to 3 cm below the knee Wound #9 Right,Lateral Lower Leg: Kerlix and Coban - Bilateral - lightly wrapped from toes to 3 cm below the knee Off-Loading: Wound #9 Right,Lateral Lower Leg: Mattress Turn and reposition every 2 hours Additional Orders / Instructions: Wound #5 Right Calcaneus: Increase protein intake. Other: - Please add vitamin A, vitamin C and zinc supplements to your diet Wound #6 Left Calcaneus: Increase protein intake. Other: - Please add vitamin A, vitamin C and zinc supplements to your diet Wound #7 Left,Lateral Malleolus: Increase protein intake. Other: - Please add vitamin A, vitamin C and zinc supplements to your diet Wound #8 Left,Posterior Lower Leg: Increase protein intake. Other: - Please add vitamin A, vitamin C and zinc supplements to your diet Wound #9 Right,Lateral Lower Leg: Increase protein intake. Other: - Please add vitamin A, vitamin C and zinc supplements to your diet Home Health: Wound #5 Right Calcaneus: Continue Home Health Visits - [facility] Nurse may visit PRN to address patient s wound care needs. FACE TO FACE ENCOUNTER: MEDICARE and MEDICAID PATIENTS: I certify  that this patient is under my care and that I had a face-to-face encounter that meets the physician face-to-face encounter MADELEIN, MAHADEO ([id number]) requirements with this patient on this date. The encounter with the patient was in whole or in part for the following  MEDICAL CONDITION: (primary reason for Home Healthcare) MEDICAL NECESSITY: I certify, that based on my findings, NURSING services are a medically necessary home health service. HOME BOUND STATUS: I certify that my clinical findings support that this patient is homebound (i.e., Due to illness or injury, pt requires aid of supportive devices such as crutches, cane, wheelchairs, walkers, the use of special transportation or the assistance of another person to leave their place of residence. There is a normal inability to leave the home and doing so requires considerable and taxing effort. Other absences are for medical reasons / religious services and are infrequent or of short duration when for other reasons). If current dressing causes regression in wound condition, may D/C ordered dressing product/s and apply Normal Saline Moist Dressing daily until next Wound Healing Center / Other MD appointment. Notify Wound Healing Center of regression in wound condition at [phone]. Please direct any NON-WOUND related issues/requests for orders to patient's Primary Care Physician Wound #6 Left Calcaneus: Continue Home Health Visits - [facility] Nurse may visit PRN to address patient s wound care needs. FACE TO FACE ENCOUNTER: MEDICARE and MEDICAID PATIENTS: I certify that this patient is under my care and that I had a face-to-face encounter that meets the physician face-to-face encounter requirements with this patient on this date. The encounter with the patient was in whole or in part for the following MEDICAL CONDITION: (primary reason for Home Healthcare) MEDICAL NECESSITY: I certify, that based on my findings, NURSING services are a medically necessary home health service. HOME BOUND STATUS: I certify that my clinical findings support that this patient is homebound (i.e., Due to illness or injury, pt requires aid of supportive devices such as crutches, cane, wheelchairs, walkers, the use of  special transportation or the assistance of another person to leave their place of residence. There is a normal inability to leave the home and doing so requires considerable and taxing effort. Other absences are for medical reasons / religious services and are infrequent or of short duration when for other reasons). If current dressing causes regression in wound condition, may D/C ordered dressing product/s and apply Normal Saline Moist Dressing daily until next Wound Healing Center / Other MD appointment. Notify Wound Healing Center of regression in wound condition at [phone]. Please direct any NON-WOUND related issues/requests for orders to patient's Primary Care Physician Wound #7 Left,Lateral Malleolus: Continue Home Health Visits - [facility] Nurse may visit PRN to address patient s wound care needs. FACE TO FACE ENCOUNTER: MEDICARE and MEDICAID PATIENTS: I certify that this patient is under my care and that I had a face-to-face encounter that meets the physician face-to-face encounter requirements with this patient on this date. The encounter with the patient was in whole or in part for the following MEDICAL CONDITION: (primary reason for Home Healthcare) MEDICAL NECESSITY: I certify, that based on my findings, NURSING services are a medically necessary home health service. HOME BOUND STATUS: I certify that my clinical findings support that this patient is homebound (i.e., Due to illness or injury, pt requires aid of supportive devices such as crutches, cane, wheelchairs, walkers, the use of special transportation or the assistance of another person to leave their place of residence.  There is a normal inability to leave the home and doing so requires considerable and taxing effort. Other absences are for medical reasons / religious services and are infrequent or of short duration when for other reasons). If current dressing causes regression in wound condition, may D/C  ordered dressing product/s and apply Normal Saline Moist Dressing daily until next Wound Healing Center / Other MD appointment. Notify Wound Healing Center of regression in wound condition at [phone]. Please direct any NON-WOUND related issues/requests for orders to patient's Primary Care Physician Wound #8 Left,Posterior Lower Leg: Continue Home Health Visits - [facility] Nurse may visit PRN to address patient s wound care needs. FACE TO FACE ENCOUNTER: MEDICARE and MEDICAID PATIENTS: I certify that this patient is under my care and that I had a face-to-face encounter that meets the physician face-to-face encounter ELCIE, PELSTER ([id number]) requirements with this patient on this date. The encounter with the patient was in whole or in part for the following MEDICAL CONDITION: (primary reason for Home Healthcare) MEDICAL NECESSITY: I certify, that based on my findings, NURSING services are a medically necessary home health service. HOME BOUND STATUS: I certify that my clinical findings support that this patient is homebound (i.e., Due to illness or injury, pt requires aid of supportive devices such as crutches, cane, wheelchairs, walkers, the use of special transportation or the assistance of another person to leave their place of residence. There is a normal inability to leave the home and doing so requires considerable and taxing effort. Other absences are for medical reasons / religious services and are infrequent or of short duration when for other reasons). If current dressing causes regression in wound condition, may D/C ordered dressing product/s and apply Normal Saline Moist Dressing daily until next Wound Healing Center / Other MD appointment. Notify Wound Healing Center of regression in wound condition at [phone]. Please direct any NON-WOUND related issues/requests for orders to patient's Primary Care Physician Wound #9 Right,Lateral Lower Leg: Continue Home  Health Visits - [facility] Nurse may visit PRN to address patient s wound care needs. FACE TO FACE ENCOUNTER: MEDICARE and MEDICAID PATIENTS: I certify that this patient is under my care and that I had a face-to-face encounter that meets the physician face-to-face encounter requirements with this patient on this date. The encounter with the patient was in whole or in part for the following MEDICAL CONDITION: (primary reason for Home Healthcare) MEDICAL NECESSITY: I certify, that based on my findings, NURSING services are a medically necessary home health service. HOME BOUND STATUS: I certify that my clinical findings support that this patient is homebound (i.e., Due to illness or injury, pt requires aid of supportive devices such as crutches, cane, wheelchairs, walkers, the use of special transportation or the assistance of another person to leave their place of residence. There is a normal inability to leave the home and doing so requires considerable and taxing effort. Other absences are for medical reasons / religious services and are infrequent or of short duration when for other reasons). If current dressing causes regression in wound condition, may D/C ordered dressing product/s and apply Normal Saline Moist Dressing daily until next Wound Healing Center / Other MD appointment. Notify Wound Healing Center of regression in wound condition at [phone]. Please direct any NON-WOUND related issues/requests for orders to patient's Primary Care Physician #1 I'm continuing with Iodoflex to the areas on the left medial and right medial heel which are in the necrotic wounds. #2 silver  collagen to the other wounds. #3 the patient may have arterial insufficiency however I don't believe that she would be a candidate for revascularization and therefore attempting to diagnose this would be academic #4 follow-up in 2 weeks #5 although the patient has advanced dementia her son is always  maintained an aggressive posture towards the care of his mother including these wounds. SHAMANDA, LEN ([id number]) Electronic Signature(s) Signed: 01/06/2017 5:43:18 PM By: Baltazar Najjar MD Entered By: Baltazar Najjar on 01/06/2017 17:32:37 Altemus, [name] ([id number]) -------------------------------------------------------------------------------- SuperBill Details [name] Date of Service: 01/06/2017 Patient Name: [name] Account Number: [id number] Medical Record Treating RN: [name] [id number] Number: Other Clinician: [birth year] (83 y.o. 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

## 2017-01-19 ENCOUNTER — Ambulatory Visit: Payer: Medicare Other | Admitting: Internal Medicine

## 2017-01-27 ENCOUNTER — Encounter: Payer: Medicare Other | Attending: Physician Assistant | Admitting: Physician Assistant

## 2017-01-27 DIAGNOSIS — G301 Alzheimer's disease with late onset: Secondary | ICD-10-CM | POA: Diagnosis not present

## 2017-01-27 DIAGNOSIS — L89613 Pressure ulcer of right heel, stage 3: Secondary | ICD-10-CM | POA: Diagnosis not present

## 2017-01-27 DIAGNOSIS — L97223 Non-pressure chronic ulcer of left calf with necrosis of muscle: Secondary | ICD-10-CM | POA: Insufficient documentation

## 2017-01-27 DIAGNOSIS — L89523 Pressure ulcer of left ankle, stage 3: Secondary | ICD-10-CM | POA: Diagnosis not present

## 2017-01-27 DIAGNOSIS — L89623 Pressure ulcer of left heel, stage 3: Secondary | ICD-10-CM | POA: Insufficient documentation

## 2017-01-28 ENCOUNTER — Other Ambulatory Visit
Admission: RE | Admit: 2017-01-28 | Discharge: 2017-01-28 | Disposition: A | Discharge status: Home / Self Care | Payer: Medicare Other | Source: Ambulatory Visit | Attending: Physician Assistant | Admitting: Physician Assistant

## 2017-01-28 DIAGNOSIS — B999 Unspecified infectious disease: Secondary | ICD-10-CM | POA: Insufficient documentation

## 2017-01-29 NOTE — Progress Notes (Addendum)
BRIEANN, OSINSKI ([id number]) Visit Report for 01/27/2017 Chief Complaint Document Details Patient Name: [name], [name]. Date of Service: 01/27/2017 3:30 PM Medical Record Number: [id number] Patient Account Number: [id number] Date of Birth/Sex: 1921/01/22 (83 y.o. Female) Treating RN: Ashok Cordia, Debi Primary Care Provider: [name] Other Clinician: Referring Provider: [name] Treating Provider/Extender: Linwood Dibbles, HOYT Weeks in Treatment: 7 Information Obtained from: Patient Chief Complaint Follow-up regarding right lateral calf wounds and right heel pressure injury Electronic Signature(s) Signed: 01/28/2017 10:10:39 AM By: Lenda Kelp PA-C Entered By: Lenda Kelp on 01/27/2017 16:21:25 [name], [name] ([id number]) -------------------------------------------------------------------------------- Debridement Details Patient Name: [name] Babe L. Date of Service: 01/27/2017 3:30 PM Medical Record Number: [id number] Patient Account Number: [id number] Date of Birth/Sex: Oct 21, 1920 (83 y.o. Female) Treating RN: [name] Primary Care Provider: [name] Other Clinician: Referring Provider: [name] Treating Provider/Extender: Linwood Dibbles, HOYT Weeks in Treatment: 7 Debridement Performed for Wound #10 Right,Dorsal Foot Assessment: Performed By: Physician STONE III, HOYT E., PA-C Debridement: Open Wound/Selective Debridement Selective Description: Pre-procedure Verification/Time Out Yes - 16:32 Taken: Start Time: 16:43 Pain Control: Lidocaine 4% Topical Solution Level: Non-Viable Tissue Total Area Debrided (L x 1 (cm) x 1 (cm) = 1 (cm) W): Tissue and other Viable, Non-Viable, Exudate, Fibrin/Slough material debrided: Instrument: Forceps, Scissors Specimen: Swab Number of Specimens 1 Taken: Bleeding: Minimum Hemostasis Achieved: Pressure End Time: 16:44 Procedural Pain: 0 Post Procedural Pain: 0 Response to  Treatment: Procedure was tolerated well Post Debridement Measurements of Total Wound Length: (cm) 1.5 Stage: Category/Stage II Width: (cm) 2 Depth: (cm) 0.1 Volume: (cm) 0.236 Character of Wound/Ulcer Post Requires Further Debridement: Debridement Post Procedure Diagnosis Same as Pre-procedure Electronic Signature(s) RAEGHAN, DEMETER ([id number]) Signed: 02/18/2017 5:33:26 PM By: Elliot Gurney, BSN, RN, CWS, Kim RN, BSN Signed: 02/22/2017 10:21:41 AM By: Lenda Kelp PA-C Previous Signature: 01/28/2017 7:50:03 AM Version By: [name] Previous Signature: 01/28/2017 10:10:39 AM Version By: Lenda Kelp PA-C Entered By: Elliot Gurney, BSN, RN, CWS, Kim on 02/18/2017 17:33:26 [name], [name] ([id number]) -------------------------------------------------------------------------------- Debridement Details Patient Name: [name], [name] Date of Service: 01/27/2017 3:30 PM Medical Record Number: [id number] Patient Account Number: [id number] Date of Birth/Sex: July 13, 1921 (83 y.o. Female) Treating RN: [name] Primary Care Provider: [name] Other Clinician: Referring Provider: [name] Treating Provider/Extender: Linwood Dibbles, HOYT Weeks in Treatment: 7 Debridement Performed for Wound #11 Right,Plantar Metatarsal head fifth Assessment: Performed By: Physician STONE III, HOYT E., PA-C Debridement: Open Wound/Selective Debridement Selective Description: Pre-procedure Verification/Time Out Yes - 16:32 Taken: Start Time: 16:33 Pain Control: Lidocaine 4% Topical Solution Level: Non-Viable Tissue Total Area Debrided (L x 0.4 (cm) x 0.4 (cm) = 0.16 (cm) W): Tissue and other Viable, Non-Viable, Exudate, Fibrin/Slough material debrided: Instrument: Forceps, Scissors Bleeding: Minimum Hemostasis Achieved: Pressure End Time: 16:35 Procedural Pain: 0 Post Procedural Pain: 0 Response to Treatment: Procedure was tolerated well Post Debridement Measurements of  Total Wound Length: (cm) 2 Stage: Category/Stage II Width: (cm) 1 Depth: (cm) 0.3 Volume: (cm) 0.471 Character of Wound/Ulcer Post Requires Further Debridement: Debridement Post Procedure Diagnosis Same as Pre-procedure Electronic Signature(s) Signed: 02/18/2017 5:33:37 PM By: Elliot Gurney, BSN, RN, CWS, Kim RN, BSN Signed: 02/22/2017 10:21:41 AM By: Lenda Kelp PA-C Previous Signature: 01/28/2017 7:50:03 AM Version By: [name] Previous Signature: 01/28/2017 10:10:39 AM Version By: Eula Flax, [place] ([id number]) Entered By: Elliot Gurney, BSN, RN, CWS, Kim on 02/18/2017 17:33:37 [name], [name] ([id number]) -------------------------------------------------------------------------------- Debridement Details Patient Name: [name], [name] Date of Service:  01/27/2017 3:30 PM Medical Record Number: [id number] Patient Account Number: [id number] Date of Birth/Sex: 23-Feb-1921 (83 y.o. Female) Treating RN: [name] Primary Care Provider: [name] Other Clinician: Referring Provider: [name] Treating Provider/Extender: Linwood Dibbles, HOYT Weeks in Treatment: 7 Debridement Performed for Wound #5 Right Calcaneus Assessment: Performed By: Physician STONE III, HOYT E., PA-C Debridement: Open Wound/Selective Debridement Selective Description: Pre-procedure Verification/Time Out Yes - 16:32 Taken: Start Time: 16:35 Pain Control: Lidocaine 4% Topical Solution Level: Non-Viable Tissue Total Area Debrided (L x 2 (cm) x 2 (cm) = 4 (cm) W): Tissue and other Viable, Non-Viable, Exudate, Fibrin/Slough material debrided: Instrument: Forceps, Scissors Bleeding: Minimum Hemostasis Achieved: Pressure End Time: 16:37 Procedural Pain: 0 Post Procedural Pain: 0 Response to Treatment: Procedure was tolerated well Post Debridement Measurements of Total Wound Length: (cm) 6 Stage: Category/Stage III Width: (cm) 7.4 Depth: (cm) 0.4 Volume: (cm)  13.949 Character of Wound/Ulcer Post Requires Further Debridement: Debridement Post Procedure Diagnosis Same as Pre-procedure Electronic Signature(s) Signed: 02/18/2017 5:33:48 PM By: Elliot Gurney, BSN, RN, CWS, Kim RN, BSN Signed: 02/22/2017 10:21:41 AM By: Lenda Kelp PA-C Previous Signature: 01/28/2017 7:50:03 AM Version By: [name] Previous Signature: 01/28/2017 10:10:39 AM Version By: Eula Flax, [place] ([id number]) Entered By: Elliot Gurney, BSN, RN, CWS, Kim on 02/18/2017 17:33:48 [name], [name] ([id number]) -------------------------------------------------------------------------------- Debridement Details Patient Name: [name], [name] Date of Service: 01/27/2017 3:30 PM Medical Record Number: [id number] Patient Account Number: [id number] Date of Birth/Sex: Aug 26, 1920 (83 y.o. Female) Treating RN: [name] Primary Care Provider: [name] Other Clinician: Referring Provider: [name] Treating Provider/Extender: Linwood Dibbles, HOYT Weeks in Treatment: 7 Debridement Performed for Wound #6 Left Calcaneus Assessment: Performed By: Physician STONE III, HOYT E., PA-C Debridement: Open Wound/Selective Debridement Selective Description: Pre-procedure Verification/Time Out Yes - 16:32 Taken: Start Time: 16:38 Pain Control: Lidocaine 4% Topical Solution Level: Non-Viable Tissue Total Area Debrided (L x 2 (cm) x 2 (cm) = 4 (cm) W): Tissue and other Viable, Non-Viable, Exudate, Fibrin/Slough material debrided: Instrument: Forceps, Scissors Bleeding: Minimum Hemostasis Achieved: Pressure End Time: 16:40 Procedural Pain: 0 Post Procedural Pain: 0 Response to Treatment: Procedure was tolerated well Post Debridement Measurements of Total Wound Length: (cm) 2.2 Stage: Category/Stage II Width: (cm) 2.8 Depth: (cm) 0.6 Volume: (cm) 2.903 Character of Wound/Ulcer Post Requires Further Debridement: Debridement Post Procedure  Diagnosis Same as Pre-procedure Electronic Signature(s) Signed: 02/18/2017 5:34:01 PM By: Elliot Gurney, BSN, RN, CWS, Kim RN, BSN Signed: 02/22/2017 10:21:41 AM By: Lenda Kelp PA-C Previous Signature: 01/28/2017 7:50:03 AM Version By: [name] Previous Signature: 01/28/2017 10:10:39 AM Version By: Eula Flax, [place] ([id number]) Entered By: Elliot Gurney, BSN, RN, CWS, Kim on 02/18/2017 17:34:01 [name], [name] ([id number]) -------------------------------------------------------------------------------- Debridement Details Patient Name: [name], [name] Date of Service: 01/27/2017 3:30 PM Medical Record Number: [id number] Patient Account Number: [id number] Date of Birth/Sex: Apr 20, 1921 (83 y.o. Female) Treating RN: [name] Primary Care Provider: [name] Other Clinician: Referring Provider: [name] Treating Provider/Extender: Linwood Dibbles, HOYT Weeks in Treatment: 7 Debridement Performed for Wound #8 Left,Posterior Lower Leg Assessment: Performed By: Physician STONE III, HOYT E., PA-C Debridement: Open Wound/Selective Debridement Selective Description: Pre-procedure Verification/Time Out Yes - 16:32 Taken: Start Time: 16:41 Pain Control: Lidocaine 4% Topical Solution Level: Non-Viable Tissue Total Area Debrided (L x 1 (cm) x 1 (cm) = 1 (cm) W): Tissue and other Viable, Non-Viable, Exudate, Fibrin/Slough material debrided: Instrument: Forceps, Scissors Bleeding: Minimum Hemostasis Achieved: Pressure End Time: 16:42 Procedural Pain: 0 Post  Procedural Pain: 0 Response to Treatment: Procedure was tolerated well Post Debridement Measurements of Total Wound Length: (cm) 5.5 Stage: Category/Stage III Width: (cm) 2.5 Depth: (cm) 0.5 Volume: (cm) 5.4 Character of Wound/Ulcer Post Requires Further Debridement: Debridement Post Procedure Diagnosis Same as Pre-procedure Electronic Signature(s) Signed: 02/18/2017 5:34:12 PM By:  Elliot Gurney, BSN, RN, CWS, Kim RN, BSN Signed: 02/22/2017 10:21:41 AM By: Lenda Kelp PA-C Previous Signature: 01/28/2017 7:50:03 AM Version By: [name] Previous Signature: 01/28/2017 10:10:39 AM Version By: Eula Flax, [place] ([id number]) Entered By: Elliot Gurney, BSN, RN, CWS, Kim on 02/18/2017 17:34:12 [name], [name] ([id number]) -------------------------------------------------------------------------------- HPI Details Patient Name: [name], [name] Date of Service: 01/27/2017 3:30 PM Medical Record Number: [id number] Patient Account Number: [id number] Date of Birth/Sex: [birth year] (83 y.o. Female) Treating RN: Ashok Cordia, Debi Primary Care Provider: [name] Other Clinician: Referring Provider: [name] Treating Provider/Extender: Linwood Dibbles, HOYT Weeks in Treatment: 7 History of Present Illness HPI Description: 03/24/16; this is an elderly 83 year old woman with advanced dementia who was hospitalized from 5/8 through 5/13. At that point she had Proteus sepsis felt to be secondary to a UTI the. Acute kidney injury and delirium. Her son who is her primary caregiver says she came home from the hospital with wounds on her right lower extremity and apparently her right buttock as well. There is no mention on the hospital discharge summary of problems. She has been having [facility] and have been using silver alginate based dressings. Apparently the area on the right buttock is healing and the son stated we did not need to become involved with that. In any case the patient has for wounds to on the right heel and 2 on the posterior lateral right calf, the latter of which is not a usual pressure area however that is the history. She is apparently eating and drinking fairly well. Takes ensure well. She does not have any pressure-relief surface at home. I have reviewed her lab work from the hospital. Her admission albumin was 3.4 on  5/8 04/01/16; patient arrives with wounds looking much the same as last week. She has an extensive pressure area over her right heel and to small but deep wounds on the lateral aspect of her right lower leg. These wounds are not connected. Although there are advertised his pressure ulcers these 2 wounds are not usual for pressure ulcerations. We have not looked at the pressure ulceration on her buttock at the request of the patient's son who is the primary caregiver 04/08/16; wounds are stable to improved this week. Culture I did of the lower wound on her right lateral leg grew methicillin sensitive staph aureus she is on Septra. We are using silver alginate to all the wounds. 04/15/16; the 2 small areas on the lateral aspect of this lady's right leg continued to improve however the heel is once again covered with callus, residual alginate, acrotic material all of which requires a difficult debridement. There continues to be purulent material under this surface. This is in spite of a week of Septra that I gave him for MSSA 04/22/16 Patient today presents for follow-up evaluation. She is seen with her son in the office at this point in time she does have Alzheimer's. The good news is her wounds appeared to be somewhat smaller with current therapy. At this point in time the infection which was cultured previously appears to have improved in regard to the right heel wound. There is no purulent drainage at this  point in time and a good portion of the wound actually is eschar covered and dry with a medial portion actually open to granulation with very little slough covering. She really has no significant discomfort with palpation manipulation of the wound at this point in time. 04/28/16 patient presents today for follow-up evaluation concerning her right lateral calf wound as well as right heel wound. she has not really been complaining this for as pain is concerned according to her son seen with her in  the office at this point in time today. She seems to show no interval signs or symptoms of infection overall has been tolerating the dressing changes well. She does have Alzheimer's therefore is not able to rate her pain although she does respond with an affirmative that she hurts when I press over the heel region. 05/05/16; the area on the lateral aspect of her right calf is healed. The right heel as 2 small spots that are still open and very close to resolving as well using Aquacel Ag under border foam, Seymour, Maricruz L. ([id number]) 05/20/16; the area on the lateral aspect of her right calf remains closed. The 2 small areas on her right heel are also closed. READMISSION 12/09/16; this is a 83 year old woman with advanced dementia that we cared for in the clinic from September to the beginning of November 2017. At that point she had a right heel, right lateral calf and right buttock wound all felt to be secondary to pressure. These eventually closed over. The patient's son who is the primary caregiver states that over the last 2 months she is developed bilateral lower extremity pressure ulcers. [facility] home and they have been applying Medihoney and an alginate. These were apparently all pressure ulcers today including area on the left posterior calf. Apparently they were keeping her leg elevated on the pillow which contributed to this. The patient has advanced dementia, is nonambulatory and not able to move herself in bed. There is apparently not a nutritional issue. [facility] has [facility]. 12/16/16; patient was readmitted to the clinic last week with multiple wounds including the medial and lateral aspects of the left calcaneus left posterior calf right calcaneus. We've been using Iodoflex, dressing change by home health. Her son also reports that she has had trouble breathing which she attributes to the low air loss mattress we have for pressure relief. He  states that when he took her out of bed and put her in her chair breathing improved. As of Monday he took the low air loss mattress off the bed and states that she is able to go to bed without shortness of breath. His description sounded a lot like orthopnea. He is asking us to order a gel mattress overlay. According to her son she does not aspirate at least not frequently 12/23/16; the patient is reviewed today for multiple wounds on her lower extremities. We have been using Iodoflex dressing change by home health. Last week the patient was [facility] in with an x-ray that look like heart failure with small bilateral pleural effusions. I gave her Lasix and some potassium. She saw her primary physician earlier this week who did not add anything to this. Her son states she is doing better she has not had any of the orthopneic episodes since Monday 01/06/17; 3 week followup. using iodoflex. She has a large wound on the left lateral calf with exposed tendon, and necrotic wound over the left medial heel. Wound over the  left lateral malleolus appears to be improved. On the right side necrotic wound on the right medial heel, wound on the right lateral heel looks somewhat better and the area on the left anterior lateral calf looks as though it's progressing towards closure. The patient may have arterial issues however she is too far along in terms of her dementia to undergo testing. Her son claims she is eating well and there offloading this. 01/27/17 on evaluation today patient appears to be doing somewhat worse with new wounds over the right fifth metatarsal region laterally as well as the dorsal right foot. Both of these appear to be deep tissue injuries which have now opened over the two weeks since we last saw her. She also has a mildly tissue injury in the fifth metatarsal region on the left although this does not appear to be worsening and in fact I think wound up being okay. Nonetheless she does  have skin in which is loose of the right heel as well as the left posterior calf region. She wears the [employer] when she is in bed but has not been wearing them when she is in her wheelchair. Electronic Signature(s) Signed: 01/28/2017 10:10:39 AM By: Buren Kos Hollerbach, [place] ([id number]) Entered By: Lenda Kelp on 01/27/2017 16:55:18 [name], [name] ([id number]) -------------------------------------------------------------------------------- Physical Exam Details Patient Name: [name], [name] Date of Service: 01/27/2017 3:30 PM Medical Record Number: [id number] Patient Account Number: [id number] Date of Birth/Sex: July 03, 1921 (83 y.o. Female) Treating RN: Ashok Cordia, Debi Primary Care Provider: [name] Other Clinician: Referring Provider: [name] Treating Provider/Extender: STONE III, HOYT Weeks in Treatment: 7 Constitutional Chronically ill appearing but in no apparent acute distress. Respiratory normal breathing without difficulty. Psychiatric Patient is not able to cooperate in decision making regarding care. Patient has dementia. patient is confused. Notes Unfortunately overall patient does not appear to be doing well in regard to her wounds. The right dorsal foot wound which is new appears to potentially be infected. A culture was obtained in selective debridement was performed over several the wounds to remove necrotic tissue but no deeper/subcutaneous debridement was performed. Electronic Signature(s) Signed: 01/28/2017 10:10:39 AM By: Lenda Kelp PA-C Entered By: Lenda Kelp on 01/27/2017 16:56:22 [place], [name] -------------------------------------------------------------------------------- Physician Orders Details Patient Name: [name] Babe L. Date of Service: 01/27/2017 3:30 PM Medical Record Number: [id number] Patient Account Number: [id number] Date of Birth/Sex: 1920-12-23 (83 y.o.  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 o Cleanse wound with mild soap and water Wound #11 Right,Plantar Metatarsal head fifth o Clean wound with Normal Saline. o Cleanse wound with mild soap and water Wound #5 Right Calcaneus o Clean wound with Normal Saline. o Cleanse wound with mild soap and water Wound #6 Left Calcaneus o Clean wound with Normal Saline. o Cleanse wound with mild soap and water Wound #7 Left,Lateral Malleolus o Clean wound with Normal Saline. o Cleanse wound with mild soap and water Wound #8  Left,Posterior Lower Leg o Clean wound with Normal Saline. o Cleanse wound with mild soap and water Anesthetic Wound #10 Right,Dorsal Foot o Topical Lidocaine 4% cream applied to wound bed prior to debridement - for clinic use CLAUDETTE, WERMUTH ([id number]) Wound #11 Right,Plantar Metatarsal head fifth o Topical Lidocaine 4% cream applied to wound bed prior to debridement - for clinic use Wound #5 Right Calcaneus o Topical Lidocaine 4% cream applied to wound bed prior to debridement - for clinic use Wound #6 Left Calcaneus o Topical Lidocaine 4% cream applied to wound bed prior to debridement - for clinic use Wound #7 Left,Lateral Malleolus o Topical Lidocaine 4% cream applied to wound bed prior to debridement - for clinic  use Wound #8 Left,Posterior Lower Leg o Topical Lidocaine 4% cream applied to wound bed prior to debridement - for clinic use Skin Barriers/Peri-Wound Care Wound #10 Right,Dorsal Foot o Skin Prep Wound #11 Right,Plantar Metatarsal head fifth o Skin Prep Wound #7 Left,Lateral Malleolus o Skin Prep Wound #8 Left,Posterior Lower Leg o Skin Prep Primary Wound Dressing Wound #10 Right,Dorsal Foot o Iodoflex Wound #11 Right,Plantar Metatarsal head fifth o Iodoflex Wound #5 Right Calcaneus o Iodoflex Wound #6 Left Calcaneus o Iodoflex Wound #7 Left,Lateral Malleolus o Prisma Ag - moisten with saline or hydrogel Wound #8 Left,Posterior Lower Leg o Iodoflex Tarte, Aftin L. ([id number]) Secondary Dressing Wound #10 Right,Dorsal Foot o Dry Gauze o Boardered Foam Dressing Wound #11 Right,Plantar Metatarsal head fifth o Dry Gauze o Boardered Foam Dressing Wound #5 Right Calcaneus o Dry Gauze o Conform/Kerlix o Other - allevyn heel cup, stretch netting #4 Wound #6 Left Calcaneus o Dry Gauze o Conform/Kerlix o Other - allevyn heel cup, stretch netting #4 Wound #7 Left,Lateral Malleolus o Dry Gauze o Boardered Foam Dressing Wound #8 Left,Posterior Lower Leg o Dry Gauze o Boardered Foam Dressing Dressing Change Frequency Wound #10 Right,Dorsal Foot o Change dressing every other day. Wound #11 Right,Plantar Metatarsal head fifth o Change dressing every other day. Wound #5 Right Calcaneus o Change dressing every other day. Wound #6 Left Calcaneus o Change dressing every other day. Wound #7 Left,Lateral Malleolus o Change dressing every other day. Wound #8 Left,Posterior Lower Leg o Change dressing every other day. Follow-up Appointments Wound #10 Right,Dorsal Foot Hargraves, Faylynn L. ([id number]) o Return Appointment in 1 week. Wound #11 Right,Plantar Metatarsal head fifth o Return Appointment in 1  week. Wound #5 Right Calcaneus o Return Appointment in 1 week. Wound #6 Left Calcaneus o Return Appointment in 1 week. Wound #7 Left,Lateral Malleolus o Return Appointment in 1 week. Wound #8 Left,Posterior Lower Leg o Return Appointment in 1 week. Edema Control Wound #10 Right,Dorsal Foot o Elevate legs to the level of the heart and pump ankles as often as possible Wound #11 Right,Plantar Metatarsal head fifth o Elevate legs to the level of the heart and pump ankles as often as possible Wound #5 Right Calcaneus o Elevate legs to the level of the heart and pump ankles as often as possible Wound #6 Left Calcaneus o Elevate legs to the level of the heart and pump ankles as often as possible Wound #7 Left,Lateral Malleolus o Elevate legs to the level of the heart and pump ankles as often as possible Wound #8 Left,Posterior Lower Leg o Elevate legs to the level of the heart and pump ankles as often as possible Off-Loading Wound #10 Right,Dorsal Foot o Turn and reposition every 2 hours o Other: - wear sage boots Wound #11 Right,Plantar  Metatarsal head fifth o Turn and reposition every 2 hours o Other: - wear sage boots Wound #5 Right Calcaneus o Turn and reposition every 2 hours o Other: - wear sage boots [name], [name] ([id number]) Wound #6 Left Calcaneus o Turn and reposition every 2 hours o Other: - wear sage boots Wound #7 Left,Lateral Malleolus o Turn and reposition every 2 hours o Other: - wear sage boots Wound #8 Left,Posterior Lower Leg o Turn and reposition every 2 hours o Other: - wear sage boots Additional Orders / Instructions Wound #10 Right,Dorsal Foot o Increase protein intake. o Other: - left 5th plantar metatarsal head paint with betadine every day (deep tissue injury) place foam to the top of the left foot for padded protection Wound #11 Right,Plantar Metatarsal head fifth o Increase protein  intake. o Other: - left 5th plantar metatarsal head paint with betadine every day (deep tissue injury) place foam to the top of the left foot for padded protection Wound #5 Right Calcaneus o Increase protein intake. o Other: - left 5th plantar metatarsal head paint with betadine every day (deep tissue injury) place foam to the top of the left foot for padded protection Wound #6 Left Calcaneus o Increase protein intake. o Other: - left 5th plantar metatarsal head paint with betadine every day (deep tissue injury) place foam to the top of the left foot for padded protection Wound #7 Left,Lateral Malleolus o Increase protein intake. o Other: - left 5th plantar metatarsal head paint with betadine every day (deep tissue injury) place foam to the top of the left foot for padded protection Wound #8 Left,Posterior Lower Leg o Increase protein intake. o Other: - left 5th plantar metatarsal head paint with betadine every day (deep tissue injury) place foam to the top of the left foot for padded protection Home Health Wound #10 Right,Dorsal Foot o [facility] o Home Health Nurse may visit PRN to address patientos wound care needs. SIANI, UTKE ([id number]) o FACE TO FACE ENCOUNTER: MEDICARE and MEDICAID PATIENTS: I certify that this patient is under my care and that I had a face-to-face encounter that meets the physician face-to-face encounter requirements with this patient on this date. The encounter with the patient was in whole or in part for the following MEDICAL CONDITION: (primary reason for Home Healthcare) MEDICAL NECESSITY: I certify, that based on my findings, NURSING services are a medically necessary home health service. HOME BOUND STATUS: I certify that my clinical findings support that this patient is homebound (i.e., Due to illness or injury, pt requires aid of supportive devices such as crutches, cane, wheelchairs,  walkers, the use of special transportation or the assistance of another person to leave their place of residence. There is a normal inability to leave the home and doing so requires considerable and taxing effort. Other absences are for medical reasons / religious services and are infrequent or of short duration when for other reasons). o If current dressing causes regression in wound condition, may D/C ordered dressing product/s and apply Normal Saline Moist Dressing daily until next Wound Healing Center / Other MD appointment. Notify Wound Healing Center of regression in wound condition at [phone]. o Please direct any NON-WOUND related issues/requests for orders to patient's Primary Care Physician Wound #11 Right,Plantar Metatarsal head fifth o [facility] - [facility] o Home Health Nurse may visit PRN to address patientos wound care needs. o FACE TO FACE ENCOUNTER: MEDICARE and  MEDICAID PATIENTS: I certify that this patient is under my care and that I had a face-to-face encounter that meets the physician face-to-face encounter requirements with this patient on this date. The encounter with the patient was in whole or in part for the following MEDICAL CONDITION: (primary reason for Home Healthcare) MEDICAL NECESSITY: I certify, that based on my findings, NURSING services are a medically necessary home health service. HOME BOUND STATUS: I certify that my clinical findings support that this patient is homebound (i.e., Due to illness or injury, pt requires aid of supportive devices such as crutches, cane, wheelchairs, walkers, the use of special transportation or the assistance of another person to leave their place of residence. There is a normal inability to leave the home and doing so requires considerable and taxing effort. Other absences are for medical reasons / religious services and are infrequent or of short duration when for other reasons). o If  current dressing causes regression in wound condition, may D/C ordered dressing product/s and apply Normal Saline Moist Dressing daily until next Wound Healing Center / Other MD appointment. Notify Wound Healing Center of regression in wound condition at [phone]. o Please direct any NON-WOUND related issues/requests for orders to patient's Primary Care Physician Wound #5 Right Calcaneus o [facility] o Home Health Nurse may visit PRN to address patientos wound care needs. o FACE TO FACE ENCOUNTER: MEDICARE and MEDICAID PATIENTS: I certify that this patient is under my care and that I had a face-to-face encounter that meets the physician face-to-face encounter requirements with this patient on this date. The encounter with the patient was in whole or in part for the following MEDICAL CONDITION: (primary reason for Home Healthcare) MEDICAL NECESSITY: I certify, that based on my findings, NURSING services are a medically necessary home health service. HOME BOUND STATUS: I certify that my clinical findings support that this patient is homebound (i.e., Due to illness or injury, pt requires aid of supportive devices such as crutches, cane, wheelchairs, walkers, the use of special Stallbaumer, Betania L. ([id number]) transportation or the assistance of another person to leave their place of residence. There is a normal inability to leave the home and doing so requires considerable and taxing effort. Other absences are for medical reasons / religious services and are infrequent or of short duration when for other reasons). o If current dressing causes regression in wound condition, may D/C ordered dressing product/s and apply Normal Saline Moist Dressing daily until next Wound Healing Center / Other MD appointment. Notify Wound Healing Center of regression in wound condition at [phone]. o Please direct any NON-WOUND related issues/requests for  orders to patient's Primary Care Physician Wound #6 Left Calcaneus o [facility] - [facility] o Home Health Nurse may visit PRN to address patientos wound care needs. o FACE TO FACE ENCOUNTER: MEDICARE and MEDICAID PATIENTS: I certify that this patient is under my care and that I had a face-to-face encounter that meets the physician face-to-face encounter requirements with this patient on this date. The encounter with the patient was in whole or in part for the following MEDICAL CONDITION: (primary reason for Home Healthcare) MEDICAL NECESSITY: I certify, that based on my findings, NURSING services are a medically necessary home health service. HOME BOUND STATUS: I certify that my clinical findings support that this patient is homebound (i.e., Due to illness or injury, pt requires aid of supportive devices such as crutches, cane, wheelchairs, walkers, the  use of special transportation or the assistance of another person to leave their place of residence. There is a normal inability to leave the home and doing so requires considerable and taxing effort. Other absences are for medical reasons / religious services and are infrequent or of short duration when for other reasons). o If current dressing causes regression in wound condition, may D/C ordered dressing product/s and apply Normal Saline Moist Dressing daily until next Wound Healing Center / Other MD appointment. Notify Wound Healing Center of regression in wound condition at [phone]. o Please direct any NON-WOUND related issues/requests for orders to patient's Primary Care Physician Wound #7 Left,Lateral Malleolus o [facility] - [facility] o Home Health Nurse may visit PRN to address patientos wound care needs. o FACE TO FACE ENCOUNTER: MEDICARE and MEDICAID PATIENTS: I certify that this patient is under my care and that I had a face-to-face encounter that meets the  physician face-to-face encounter requirements with this patient on this date. The encounter with the patient was in whole or in part for the following MEDICAL CONDITION: (primary reason for Home Healthcare) MEDICAL NECESSITY: I certify, that based on my findings, NURSING services are a medically necessary home health service. HOME BOUND STATUS: I certify that my clinical findings support that this patient is homebound (i.e., Due to illness or injury, pt requires aid of supportive devices such as crutches, cane, wheelchairs, walkers, the use of special transportation or the assistance of another person to leave their place of residence. There is a normal inability to leave the home and doing so requires considerable and taxing effort. Other absences are for medical reasons / religious services and are infrequent or of short duration when for other reasons). o If current dressing causes regression in wound condition, may D/C ordered dressing product/s and apply Normal Saline Moist Dressing daily until next Wound Healing Center / Other MD appointment. Notify Wound Healing Center of regression in wound condition at [phone]. DAWNIELLE, CHRISTIANA ([id number]) o Please direct any NON-WOUND related issues/requests for orders to patient's Primary Care Physician Wound #8 Left,Posterior Lower Leg o [facility] - [facility] o Home Health Nurse may visit PRN to address patientos wound care needs. o FACE TO FACE ENCOUNTER: MEDICARE and MEDICAID PATIENTS: I certify that this patient is under my care and that I had a face-to-face encounter that meets the physician face-to-face encounter requirements with this patient on this date. The encounter with the patient was in whole or in part for the following MEDICAL CONDITION: (primary reason for Home Healthcare) MEDICAL NECESSITY: I certify, that based on my findings, NURSING services are a medically necessary home health  service. HOME BOUND STATUS: I certify that my clinical findings support that this patient is homebound (i.e., Due to illness or injury, pt requires aid of supportive devices such as crutches, cane, wheelchairs, walkers, the use of special transportation or the assistance of another person to leave their place of residence. There is a normal inability to leave the home and doing so requires considerable and taxing effort. Other absences are for medical reasons / religious services and are infrequent or of short duration when for other reasons). o If current dressing causes regression in wound condition, may D/C ordered dressing product/s and apply Normal Saline Moist Dressing daily until next Wound Healing Center / Other MD appointment. Notify Wound Healing Center of regression in wound condition at [phone]. o Please direct any NON-WOUND related issues/requests for orders  to patient's Primary Care Physician Laboratory o Bacteria identified in Wound by Culture (MICRO) - right dorsal foot oooo LOINC Code: 6462-6 oooo Convenience Name: Wound culture routine Electronic Signature(s) Signed: 01/28/2017 7:50:03 AM By: [name] Signed: 01/28/2017 10:10:39 AM By: Lenda Kelp PA-C Entered By: [name] on 01/27/2017 17:09:22 Rodick, [name] ([id number]) -------------------------------------------------------------------------------- Problem List Details Patient Name: Halley, Harshitha L. Date of Service: 01/27/2017 3:30 PM Medical Record Number: [id number] Patient Account Number: [id number] Date of Birth/Sex: 1921-06-06 (83 y.o. 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 Date of Service: 01/27/2017 3:30 PM Medical Record Number: [id number] Patient Account Number: [id number] Date of Birth/Sex: [birth year] (83 y.o. Female) Treating RN: Ashok Cordia, Debi Primary Care Provider: [name] Other Clinician: Referring Provider: [name] Treating Provider/Extender: Linwood Dibbles, HOYT Weeks in Treatment: 7 Subjective Chief Complaint Information obtained from Patient Follow-up regarding right lateral calf wounds and right heel pressure injury History of Present Illness (HPI) 03/24/16; this is an elderly 83 year old woman with advanced dementia who was hospitalized from 5/8 through 5/13. At that point she had Proteus sepsis felt to be secondary to a UTI the. Acute kidney injury and delirium. Her son who is her primary caregiver says she came home from the hospital with wounds on her right lower extremity and apparently her right buttock as well. There is no mention on the hospital discharge summary of problems. She has been having [facility] and have been using silver alginate based dressings. Apparently the area on the right buttock is healing and the son stated we did not need to become involved with that. In any case the patient has for wounds to on the right heel and 2 on the posterior lateral right calf, the latter of which is not a usual pressure area however  that is the history. She is apparently eating and drinking fairly well. Takes ensure well. She does not have any pressure-relief surface at home. I have reviewed her lab work from the hospital. Her admission albumin was 3.4 on 5/8 04/01/16; patient arrives with wounds looking much the same as last week. She has an extensive pressure area over her right heel and to small but deep wounds on the lateral aspect of her right lower leg. These wounds are not connected. Although there are advertised his pressure ulcers these 2 wounds are not usual for pressure ulcerations. We have not looked at the pressure ulceration on her buttock at the request of the patient's son who is the primary caregiver 04/08/16; wounds are stable to improved this week. Culture I did of the lower wound on her right lateral leg grew methicillin sensitive staph aureus she is on Septra. We are using silver alginate to all the wounds. 04/15/16; the 2 small areas on the lateral aspect of this lady's right leg continued to improve however the  heel is once again covered with callus, residual alginate, acrotic material all of which requires a difficult debridement. There continues to be purulent material under this surface. This is in spite of a week of Septra that I gave him for MSSA 04/22/16 Patient today presents for follow-up evaluation. She is seen with her son in the office at this point in time she does have Alzheimer's. The good news is her wounds appeared to be somewhat smaller with current therapy. At this point in time the infection which was cultured previously appears to have improved in regard to the right heel wound. There is no purulent drainage at this point in time and a good portion of the wound actually is eschar covered and dry with a medial portion actually open to granulation with very little slough covering. She really has no significant discomfort with palpation manipulation of the wound at this point in  time. 04/28/16 patient presents today for follow-up evaluation concerning her right lateral calf wound as well as right heel wound. she has not really been complaining this for as pain is concerned according to her son MYLIE, MCCURLEY ([id number]) seen with her in the office at this point in time today. She seems to show no interval signs or symptoms of infection overall has been tolerating the dressing changes well. She does have Alzheimer's therefore is not able to rate her pain although she does respond with an affirmative that she hurts when I press over the heel region. 05/05/16; the area on the lateral aspect of her right calf is healed. The right heel as 2 small spots that are still open and very close to resolving as well using Aquacel Ag under border foam, 05/20/16; the area on the lateral aspect of her right calf remains closed. The 2 small areas on her right heel are also closed. READMISSION 12/09/16; this is a 83 year old woman with advanced dementia that we cared for in the clinic from September to the beginning of November 2017. At that point she had a right heel, right lateral calf and right buttock wound all felt to be secondary to pressure. These eventually closed over. The patient's son who is the primary caregiver states that over the last 2 months she is developed bilateral lower extremity pressure ulcers. [facility] and they have been applying Medihoney and an alginate. These were apparently all pressure ulcers today including area on the left posterior calf. Apparently they were keeping her leg elevated on the pillow which contributed to this. The patient has advanced dementia, is nonambulatory and not able to move herself in bed. There is apparently not a nutritional issue. [facility] has [facility]. 12/16/16; patient was readmitted to the clinic last week with multiple wounds including the medial and lateral aspects of the left calcaneus  left posterior calf right calcaneus. We've been using Iodoflex, dressing change by home health. Her son also reports that she has had trouble breathing which she attributes to the low air loss mattress we have for pressure relief. He states that when he took her out of bed and put her in her chair breathing improved. As of Monday he took the low air loss mattress off the bed and states that she is able to go to bed without shortness of breath. His description sounded a lot like orthopnea. He is asking [place] to order a gel mattress overlay. According to her son she does not aspirate at least not frequently 12/23/16; the patient  is reviewed today for multiple wounds on her lower extremities. We have been using Iodoflex dressing change by home health. Last week the patient was Cheyne-Stokes in with an x-ray that look like heart failure with small bilateral pleural effusions. I gave her Lasix and some potassium. She saw her primary physician earlier this week who did not add anything to this. Her son states she is doing better she has not had any of the orthopneic episodes since Monday 01/06/17; 3 week followup. using iodoflex. She has a large wound on the left lateral calf with exposed tendon, and necrotic wound over the left medial heel. Wound over the left lateral malleolus appears to be improved. On the right side necrotic wound on the right medial heel, wound on the right lateral heel looks somewhat better and the area on the left anterior lateral calf looks as though it's progressing towards closure. The patient may have arterial issues however she is too far along in terms of her dementia to undergo testing. Her son claims she is eating well and there offloading this. 01/27/17 on evaluation today patient appears to be doing somewhat worse with new wounds over the right fifth metatarsal region laterally as well as the dorsal right foot. Both of these appear to be deep tissue injuries which have now  opened over the two weeks since we last saw her. She also has a mildly tissue injury in the fifth metatarsal region on the left although this does not appear to be worsening and in fact I think wound up being okay. Nonetheless she does have skin in which is loose of the right heel as well as Latin, Birdena L. ([id number]) the left posterior calf region. She wears the [employer] when she is in bed but has not been wearing them when she is in her wheelchair. Objective Constitutional Chronically ill appearing but in no apparent acute distress. Vitals Time Taken: 3:39 PM, Height: 69 in, Weight: 105 lbs, BMI: 15.5, Temperature: 98.2 F, Pulse: 93 bpm, Respiratory Rate: 16 breaths/min, Blood Pressure: 144/56 mmHg. Respiratory normal breathing without difficulty. Psychiatric Patient is not able to cooperate in decision making regarding care. Patient has dementia. patient is confused. General Notes: Unfortunately overall patient does not appear to be doing well in regard to her wounds. The right dorsal foot wound which is new appears to potentially be infected. A culture was obtained in selective debridement was performed over several the wounds to remove necrotic tissue but no deeper/subcutaneous debridement was performed. Integumentary (Hair, Skin) Wound #10 status is Open. Original cause of wound was Pressure Injury. The wound is located on the Right,Dorsal Foot. The wound measures 1.5cm length x 2cm width x 0.1cm depth; 2.356cm^2 area and 0.236cm^3 volume. There is no tunneling or undermining noted. There is a large amount of serosanguineous drainage noted. The wound margin is distinct with the outline attached to the wound base. There is small (1-33%) red granulation within the wound bed. There is a large (67-100%) amount of necrotic tissue within the wound bed including Eschar and Adherent Slough. The periwound skin appearance exhibited: Hemosiderin Staining, Rubor, Erythema. The  surrounding wound skin color is noted with erythema which is circumferential. Periwound temperature was noted as No Abnormality. The periwound has tenderness on palpation. Wound #11 status is Open. Original cause of wound was Pressure Injury. The wound is located on the Right,Plantar Metatarsal head fifth. The wound measures 0.4cm length x 0.4cm width x 0.3cm depth; 0.126cm^2 area and 0.038cm^3 volume. There is no  tunneling noted, however, there is undermining starting at 12:00 and ending at 6:00 with a maximum distance of 1cm. There is a large amount of serosanguineous drainage noted. The wound margin is distinct with the outline attached to the wound base. There is medium (34-66%) red granulation within the wound bed. There is a medium (34-66%) amount of necrotic tissue within the wound bed including Adherent Slough. The periwound skin appearance exhibited: KAELEI, WHEELER L. ([id number]) Rubor, Erythema. The surrounding wound skin color is noted with erythema which is circumferential. Wound #5 status is Open. Original cause of wound was Pressure Injury. The wound is located on the Right Calcaneus. The wound measures 6cm length x 7.4cm width x 0.4cm depth; 34.872cm^2 area and 13.949cm^3 volume. There is no tunneling or undermining noted. There is a large amount of purulent drainage noted. The wound margin is distinct with the outline attached to the wound base. There is small (1-33%) red granulation within the wound bed. There is a large (67-100%) amount of necrotic tissue within the wound bed including Eschar and Adherent Slough. The periwound skin appearance exhibited: Erythema. The periwound skin appearance did not exhibit: Ecchymosis. The surrounding wound skin color is noted with erythema which is circumferential. Periwound temperature was noted as No Abnormality. The periwound has tenderness on palpation. Wound #6 status is Open. Original cause of wound was Pressure Injury. The wound  is located on the Left Calcaneus. The wound measures 2.2cm length x 2.8cm width x 0.6cm depth; 4.838cm^2 area and 2.903cm^3 volume. There is no tunneling or undermining noted. There is a medium amount of purulent drainage noted. The wound margin is distinct with the outline attached to the wound base. There is small (1-33%) red granulation within the wound bed. There is a large (67-100%) amount of necrotic tissue within the wound bed including Eschar and Adherent Slough. The periwound skin appearance exhibited: Erythema. The surrounding wound skin color is noted with erythema which is circumferential. Periwound temperature was noted as No Abnormality. The periwound has tenderness on palpation. Wound #7 status is Open. Original cause of wound was Pressure Injury. The wound is located on the Left,Lateral Malleolus. The wound measures 1.4cm length x 1cm width x 0.1cm depth; 1.1cm^2 area and 0.11cm^3 volume. There is no tunneling or undermining noted. There is a large amount of serosanguineous drainage noted. The wound margin is distinct with the outline attached to the wound base. There is large (67-100%) red granulation within the wound bed. There is a small (1-33%) amount of necrotic tissue within the wound bed including Adherent Slough. The periwound skin appearance exhibited: Erythema. The surrounding wound skin color is noted with erythema which is circumferential. Periwound temperature was noted as No Abnormality. The periwound has tenderness on palpation. Wound #8 status is Open. Original cause of wound was Pressure Injury. The wound is located on the Left,Posterior Lower Leg. The wound measures 5.5cm length x 2.5cm width x 0.5cm depth; 10.799cm^2 area and 5.4cm^3 volume. There is no tunneling or undermining noted. There is a large amount of purulent drainage noted. The wound margin is distinct with the outline attached to the wound base. There is medium (34-66%) red granulation within the  wound bed. There is a medium (34-66%) amount of necrotic tissue within the wound bed including Eschar and Adherent Slough. The periwound skin appearance exhibited: Erythema. The surrounding wound skin color is noted with erythema which is circumferential. Periwound temperature was noted as No Abnormality. The periwound has tenderness on palpation. Wound #9 status is Healed -  Epithelialized. Original cause of wound was Pressure Injury. The wound is located on the Right,Lateral Lower Leg. The wound measures 0cm length x 0cm width x 0cm depth; 0cm^2 area and 0cm^3 volume. Other Condition(s) Patient presents with Suspected Deep Tissue Injury located on the Left left plantar 5th metatarsal head. 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 There was a Non-Viable Tissue Open Wound/Selective [phone]) debridement with total area of 1 sq cm performed by STONE III, HOYT E., PA-C. with the following instrument(s): Forceps and Scissors to remove Viable and Non-Viable tissue/material including Exudate and Fibrin/Slough after achieving pain control using Lidocaine 4% Topical Solution. 1 Specimen was taken by a Swab and sent to the lab per facility protocol.A time out was conducted at 16:32, prior to the start of the procedure. A Minimum amount of bleeding was controlled with Pressure. The procedure was tolerated well with a pain level of 0 throughout and a pain level of 0 following the procedure. Post Debridement Measurements: 1.5cm length x 2cm width x 0.1cm depth; 0.236cm^3 volume. Post debridement Stage  noted as Category/Stage II. Character of Wound/Ulcer Post Debridement requires further debridement. Post procedure Diagnosis Wound #10: Same as Pre-Procedure Wound #11 Pre-procedure diagnosis of Wound #11 is a Pressure Ulcer located on the Right,Plantar Metatarsal head fifth . There was a Non-Viable Tissue Open Wound/Selective [phone]) debridement with total area of 0.16 sq cm performed by STONE III, HOYT E., PA-C. with the following instrument(s): Forceps and Scissors to remove Viable and Non-Viable tissue/material including Exudate and Fibrin/Slough after achieving pain control using Lidocaine 4% Topical Solution. A time out was conducted at 16:32, prior to the start of the procedure. A Minimum amount of bleeding was controlled with Pressure. The procedure was tolerated well with a pain level of 0 throughout and a pain level of 0 following the procedure. Post Debridement Measurements: 2cm length x 1cm width x 0.3cm depth; 0.471cm^3 volume. Post debridement Stage noted as Category/Stage II. Character of Wound/Ulcer Post Debridement requires further debridement. Post procedure Diagnosis Wound #11: Same as Pre-Procedure Wound #5 Pre-procedure diagnosis of Wound #5 is a Pressure Ulcer located on the Right Calcaneus . There was a Non-Viable Tissue Open Wound/Selective [phone]) debridement with total area of 4 sq cm performed by STONE III, HOYT E., PA-C. with the following instrument(s): Forceps and Scissors to remove Viable and Gorey, Wandalee L. ([id number]) Non-Viable tissue/material including Exudate and Fibrin/Slough after achieving pain control using Lidocaine 4% Topical Solution. A time out was conducted at 16:32, prior to the start of the procedure. A Minimum amount of bleeding was controlled with Pressure. The procedure was tolerated well with a pain level of 0 throughout and a pain level of 0 following the procedure. Post Debridement Measurements: 6cm length x 7.4cm width x  0.4cm depth; 13.949cm^3 volume. Post debridement Stage noted as Category/Stage III. Character of Wound/Ulcer Post Debridement requires further debridement. Post procedure Diagnosis Wound #5: Same as Pre-Procedure Wound #6 Pre-procedure diagnosis of Wound #6 is a Pressure Ulcer located on the Left Calcaneus . There was a Non-Viable Tissue Open Wound/Selective [phone]) debridement with total area of 4 sq cm performed  by STONE III, HOYT E., PA-C. with the following instrument(s): Forceps and Scissors to remove Viable and Non-Viable tissue/material including Exudate and Fibrin/Slough after achieving pain control using Lidocaine 4% Topical Solution. A time out was conducted at 16:32, prior to the start of the procedure. A Minimum amount of bleeding was controlled with Pressure. The procedure was tolerated well with a pain level of 0 throughout and a pain level of 0 following the procedure. Post Debridement Measurements: 2.2cm length x 2.8cm width x 0.6cm depth; 2.903cm^3 volume. Post debridement Stage noted as Category/Stage II. Character of Wound/Ulcer Post Debridement requires further debridement. Post procedure Diagnosis Wound #6: Same as Pre-Procedure Wound #8 Pre-procedure diagnosis of Wound #8 is a Pressure Ulcer located on the Left,Posterior Lower Leg . There was a Non-Viable Tissue Open Wound/Selective [phone]) debridement with total area of 1 sq cm performed by STONE III, HOYT E., PA-C. with the following instrument(s): Forceps and Scissors to remove Viable and Non-Viable tissue/material including Exudate and Fibrin/Slough after achieving pain control using Lidocaine 4% Topical Solution. A time out was conducted at 16:32, prior to the start of the procedure. A Minimum amount of bleeding was controlled with Pressure. The procedure was tolerated well with a pain level of 0 throughout and a pain level of 0 following the procedure. Post Debridement Measurements: 5.5cm length x 2.5cm  width x 0.5cm depth; 5.4cm^3 volume. Post debridement Stage noted as Category/Stage III. Character of Wound/Ulcer Post Debridement requires further debridement. Post procedure Diagnosis Wound #8: Same as Pre-Procedure Plan Wound Cleansing: Wound #10 Right,Dorsal Foot: Clean wound with Normal Saline. Cleanse wound with mild soap and water Wound #11 Right,Plantar Metatarsal head fifth: Clean wound with Normal Saline. Cleanse wound with mild soap and water Wound #5 Right Calcaneus: Clean wound with Normal Saline. Wichman, Antonea L. ([id number]) Cleanse wound with mild soap and water Wound #6 Left Calcaneus: Clean wound with Normal Saline. Cleanse wound with mild soap and water Wound #7 Left,Lateral Malleolus: Clean wound with Normal Saline. Cleanse wound with mild soap and water Wound #8 Left,Posterior Lower Leg: Clean wound with Normal Saline. Cleanse wound with mild soap and water Anesthetic: Wound #10 Right,Dorsal Foot: Topical Lidocaine 4% cream applied to wound bed prior to debridement - for clinic use Wound #11 Right,Plantar Metatarsal head fifth: Topical Lidocaine 4% cream applied to wound bed prior to debridement - for clinic use Wound #5 Right Calcaneus: Topical Lidocaine 4% cream applied to wound bed prior to debridement - for clinic use Wound #6 Left Calcaneus: Topical Lidocaine 4% cream applied to wound bed prior to debridement - for clinic use Wound #7 Left,Lateral Malleolus: Topical Lidocaine 4% cream applied to wound bed prior to debridement - for clinic use Wound #8 Left,Posterior Lower Leg: Topical Lidocaine 4% cream applied to wound bed prior to debridement - for clinic use Skin Barriers/Peri-Wound Care: Wound #10 Right,Dorsal Foot: Skin Prep Wound #11 Right,Plantar Metatarsal head fifth: Skin Prep Wound #7 Left,Lateral Malleolus: Skin Prep Wound #8 Left,Posterior Lower Leg: Skin Prep Primary Wound Dressing: Wound #10 Right,Dorsal Foot: Iodoflex Wound  #11 Right,Plantar Metatarsal head fifth: Iodoflex Wound #5 Right Calcaneus: Iodoflex Wound #6 Left Calcaneus: Iodoflex Wound #7 Left,Lateral Malleolus: Prisma Ag - moisten with saline or hydrogel Wound #8 Left,Posterior Lower Leg: Iodoflex Secondary Dressing: Wound #10 Right,Dorsal Foot: Dry Gauze Boardered Foam Dressing Wound #11 Right,Plantar Metatarsal head fifth: Dry Gauze Hirschfeld, Shanique L. ([id number]) Boardered Foam Dressing Wound #5 Right Calcaneus: Dry Gauze Conform/Kerlix Other - allevyn heel cup, stretch netting #4 Wound #  6 Left Calcaneus: Dry Gauze Conform/Kerlix Other - allevyn heel cup, stretch netting #4 Wound #7 Left,Lateral Malleolus: Dry Gauze Boardered Foam Dressing Wound #8 Left,Posterior Lower Leg: Dry Gauze Boardered Foam Dressing Dressing Change Frequency: Wound #10 Right,Dorsal Foot: Change dressing every other day. Wound #11 Right,Plantar Metatarsal head fifth: Change dressing every other day. Wound #5 Right Calcaneus: Change dressing every other day. Wound #6 Left Calcaneus: Change dressing every other day. Wound #7 Left,Lateral Malleolus: Change dressing every other day. Wound #8 Left,Posterior Lower Leg: Change dressing every other day. Follow-up Appointments: Wound #10 Right,Dorsal Foot: Return Appointment in 1 week. Wound #11 Right,Plantar Metatarsal head fifth: Return Appointment in 1 week. Wound #5 Right Calcaneus: Return Appointment in 1 week. Wound #6 Left Calcaneus: Return Appointment in 1 week. Wound #7 Left,Lateral Malleolus: Return Appointment in 1 week. Wound #8 Left,Posterior Lower Leg: Return Appointment in 1 week. Edema Control: Wound #10 Right,Dorsal Foot: Elevate legs to the level of the heart and pump ankles as often as possible Wound #11 Right,Plantar Metatarsal head fifth: Elevate legs to the level of the heart and pump ankles as often as possible Wound #5 Right Calcaneus: Elevate legs to the level of the  heart and pump ankles as often as possible Wound #6 Left Calcaneus: Elevate legs to the level of the heart and pump ankles as often as possible Wound #7 Left,Lateral Malleolus: Boehler, Maritza L. ([id number]) Elevate legs to the level of the heart and pump ankles as often as possible Wound #8 Left,Posterior Lower Leg: Elevate legs to the level of the heart and pump ankles as often as possible Off-Loading: Wound #10 Right,Dorsal Foot: Turn and reposition every 2 hours Other: - wear sage boots Wound #11 Right,Plantar Metatarsal head fifth: Turn and reposition every 2 hours Other: - wear sage boots Wound #5 Right Calcaneus: Turn and reposition every 2 hours Other: - wear sage boots Wound #6 Left Calcaneus: Turn and reposition every 2 hours Other: - wear sage boots Wound #7 Left,Lateral Malleolus: Turn and reposition every 2 hours Other: - wear sage boots Wound #8 Left,Posterior Lower Leg: Turn and reposition every 2 hours Other: - wear sage boots Additional Orders / Instructions: Wound #10 Right,Dorsal Foot: Increase protein intake. Other: - left 5th plantar metatarsal head paint with betadine every day (deep tissue injury) place foam to the top of the left foot for padded protection Wound #11 Right,Plantar Metatarsal head fifth: Increase protein intake. Other: - left 5th plantar metatarsal head paint with betadine every day (deep tissue injury) place foam to the top of the left foot for padded protection Wound #5 Right Calcaneus: Increase protein intake. Other: - left 5th plantar metatarsal head paint with betadine every day (deep tissue injury) place foam to the top of the left foot for padded protection Wound #6 Left Calcaneus: Increase protein intake. Other: - left 5th plantar metatarsal head paint with betadine every day (deep tissue injury) place foam to the top of the left foot for padded protection Wound #7 Left,Lateral Malleolus: Increase protein intake. Other: -  left 5th plantar metatarsal head paint with betadine every day (deep tissue injury) place foam to the top of the left foot for padded protection Wound #8 Left,Posterior Lower Leg: Increase protein intake. Other: - left 5th plantar metatarsal head paint with betadine every day (deep tissue injury) place foam to the top of the left foot for padded protection Home Health: Wound #10 Right,Dorsal Foot: Continue Home Health Visits - [facility] Nurse may  visit PRN to address patient s wound care needs. NALA, KACHEL ([id number]) FACE TO FACE ENCOUNTER: MEDICARE and MEDICAID PATIENTS: I certify that this patient is under my care and that I had a face-to-face encounter that meets the physician face-to-face encounter requirements with this patient on this date. The encounter with the patient was in whole or in part for the following MEDICAL CONDITION: (primary reason for Home Healthcare) MEDICAL NECESSITY: I certify, that based on my findings, NURSING services are a medically necessary home health service. HOME BOUND STATUS: I certify that my clinical findings support that this patient is homebound (i.e., Due to illness or injury, pt requires aid of supportive devices such as crutches, cane, wheelchairs, walkers, the use of special transportation or the assistance of another person to leave their place of residence. There is a normal inability to leave the home and doing so requires considerable and taxing effort. Other absences are for medical reasons / religious services and are infrequent or of short duration when for other reasons). If current dressing causes regression in wound condition, may D/C ordered dressing product/s and apply Normal Saline Moist Dressing daily until next Wound Healing Center / Other MD appointment. Notify Wound Healing Center of regression in wound condition at [phone]. Please direct any NON-WOUND related issues/requests for orders to patient's  Primary Care Physician Wound #11 Right,Plantar Metatarsal head fifth: Continue Home Health Visits - [facility] Nurse may visit PRN to address patient s wound care needs. FACE TO FACE ENCOUNTER: MEDICARE and MEDICAID PATIENTS: I certify that this patient is under my care and that I had a face-to-face encounter that meets the physician face-to-face encounter requirements with this patient on this date. The encounter with the patient was in whole or in part for the following MEDICAL CONDITION: (primary reason for Home Healthcare) MEDICAL NECESSITY: I certify, that based on my findings, NURSING services are a medically necessary home health service. HOME BOUND STATUS: I certify that my clinical findings support that this patient is homebound (i.e., Due to illness or injury, pt requires aid of supportive devices such as crutches, cane, wheelchairs, walkers, the use of special transportation or the assistance of another person to leave their place of residence. There is a normal inability to leave the home and doing so requires considerable and taxing effort. Other absences are for medical reasons / religious services and are infrequent or of short duration when for other reasons). If current dressing causes regression in wound condition, may D/C ordered dressing product/s and apply Normal Saline Moist Dressing daily until next Wound Healing Center / Other MD appointment. Notify Wound Healing Center of regression in wound condition at [phone]. Please direct any NON-WOUND related issues/requests for orders to patient's Primary Care Physician Wound #5 Right Calcaneus: Continue Home Health Visits - [facility] Nurse may visit PRN to address patient s wound care needs. FACE TO FACE ENCOUNTER: MEDICARE and MEDICAID PATIENTS: I certify that this patient is under my care and that I had a face-to-face encounter that meets the physician face-to-face  encounter requirements with this patient on this date. The encounter with the patient was in whole or in part for the following MEDICAL CONDITION: (primary reason for Home Healthcare) MEDICAL NECESSITY: I certify, that based on my findings, NURSING services are a medically necessary home health service. HOME BOUND STATUS: I certify that my clinical findings support that this patient is homebound (i.e., Due to illness or injury, pt requires aid of  supportive devices such as crutches, cane, wheelchairs, walkers, the use of special transportation or the assistance of another person to leave their place of residence. There is a normal inability to leave the home and doing so requires considerable and taxing effort. Other absences are for medical reasons / religious services and are infrequent or of short duration when for other reasons). If current dressing causes regression in wound condition, may D/C ordered dressing product/s and apply Normal Saline Moist Dressing daily until next Wound Healing Center / Other MD appointment. Notify Wound Healing Center of regression in wound condition at [phone]. Please direct any NON-WOUND related issues/requests for orders to patient's Primary Care Physician Wound #6 Left Calcaneus: Continue Home Health Visits - [facility] Nurse may visit PRN to address patient s wound care needs. DIANNAH, RINDFLEISCH ([id number]) FACE TO FACE ENCOUNTER: MEDICARE and MEDICAID PATIENTS: I certify that this patient is under my care and that I had a face-to-face encounter that meets the physician face-to-face encounter requirements with this patient on this date. The encounter with the patient was in whole or in part for the following MEDICAL CONDITION: (primary reason for Home Healthcare) MEDICAL NECESSITY: I certify, that based on my findings, NURSING services are a medically necessary home health service. HOME BOUND STATUS: I certify that my clinical  findings support that this patient is homebound (i.e., Due to illness or injury, pt requires aid of supportive devices such as crutches, cane, wheelchairs, walkers, the use of special transportation or the assistance of another person to leave their place of residence. There is a normal inability to leave the home and doing so requires considerable and taxing effort. Other absences are for medical reasons / religious services and are infrequent or of short duration when for other reasons). If current dressing causes regression in wound condition, may D/C ordered dressing product/s and apply Normal Saline Moist Dressing daily until next Wound Healing Center / Other MD appointment. Notify Wound Healing Center of regression in wound condition at [phone]. Please direct any NON-WOUND related issues/requests for orders to patient's Primary Care Physician Wound #7 Left,Lateral Malleolus: Continue Home Health Visits - [facility] Nurse may visit PRN to address patient s wound care needs. FACE TO FACE ENCOUNTER: MEDICARE and MEDICAID PATIENTS: I certify that this patient is under my care and that I had a face-to-face encounter that meets the physician face-to-face encounter requirements with this patient on this date. The encounter with the patient was in whole or in part for the following MEDICAL CONDITION: (primary reason for Home Healthcare) MEDICAL NECESSITY: I certify, that based on my findings, NURSING services are a medically necessary home health service. HOME BOUND STATUS: I certify that my clinical findings support that this patient is homebound (i.e., Due to illness or injury, pt requires aid of supportive devices such as crutches, cane, wheelchairs, walkers, the use of special transportation or the assistance of another person to leave their place of residence. There is a normal inability to leave the home and doing so requires considerable and taxing effort. Other  absences are for medical reasons / religious services and are infrequent or of short duration when for other reasons). If current dressing causes regression in wound condition, may D/C ordered dressing product/s and apply Normal Saline Moist Dressing daily until next Wound Healing Center / Other MD appointment. Notify Wound Healing Center of regression in wound condition at [phone]. Please direct any NON-WOUND related issues/requests for orders to patient's  Primary Care Physician Wound #8 Left,Posterior Lower Leg: Continue Home Health Visits - [facility] Nurse may visit PRN to address patient s wound care needs. FACE TO FACE ENCOUNTER: MEDICARE and MEDICAID PATIENTS: I certify that this patient is under my care and that I had a face-to-face encounter that meets the physician face-to-face encounter requirements with this patient on this date. The encounter with the patient was in whole or in part for the following MEDICAL CONDITION: (primary reason for Home Healthcare) MEDICAL NECESSITY: I certify, that based on my findings, NURSING services are a medically necessary home health service. HOME BOUND STATUS: I certify that my clinical findings support that this patient is homebound (i.e., Due to illness or injury, pt requires aid of supportive devices such as crutches, cane, wheelchairs, walkers, the use of special transportation or the assistance of another person to leave their place of residence. There is a normal inability to leave the home and doing so requires considerable and taxing effort. Other absences are for medical reasons / religious services and are infrequent or of short duration when for other reasons). If current dressing causes regression in wound condition, may D/C ordered dressing product/s and apply Normal Saline Moist Dressing daily until next Wound Healing Center / Other MD appointment. Notify Wound Healing Center of regression in wound condition at  [phone]. Please direct any NON-WOUND related issues/requests for orders to patient's Primary Care Physician Laboratory ordered were: Wound culture routine - right dorsal foot IDA, MILBRATH ([id number]) Electronic Signature(s) Signed: 02/18/2017 5:34:51 PM By: Elliot Gurney, BSN, RN, CWS, Kim RN, BSN Signed: 02/22/2017 10:21:41 AM By: Lenda Kelp PA-C Previous Signature: 01/28/2017 10:10:39 AM Version By: Lenda Kelp PA-C Entered By: Elliot Gurney BSN, RN, CWS, Kim on 02/18/2017 17:34:51 Bora, [name] ([id number]) -------------------------------------------------------------------------------- SuperBill Details Patient Name: REBEL, LAUGHRIDGE L. Date of Service: 01/27/2017 Medical Record Number: [id number] Patient Account Number: [id number] Date of Birth/Sex: 1920-08-21 (83 y.o. 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 Facility Procedures CPT4 Code: [id number] Description: 97597 - DEBRIDE WOUND 1ST 20 SQ CM OR < ICD-10 Description Diagnosis L89.623 Pressure ulcer of left heel, stage 3 L89.613 Pressure ulcer of right heel, stage 3 L89.523 Pressure ulcer of left ankle, stage 3 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

## 2017-01-29 NOTE — Progress Notes (Signed)
[name], [name] ([id number]) Visit Report for 01/27/2017 Arrival Information Details [name], [name] Date of Service: 01/27/2017 3:30 PM Patient Name: [name] Account Number: [id number] Medical Record Treating RN: [name] [id number] Number: Other Clinician: Date of Birth/Sex: Jan 18, 1921 (83 y.o. Female) Treating [name], [name] Primary Care Xayden Linsey: [name] Genetta Fiero/Extender: G Referring Mica Ramdass: [name] in Treatment: 7 Visit Information History Since Last Visit All ordered tests and consults were No Patient Arrived: Wheel Chair completed: Arrival Time: 15:36 Added or deleted any medications: No Accompanied By: son Any new allergies or adverse No Transfer Assistance: EasyP[employer] Patient reactions: Lift Had a fall or experienced change in No Patient Identification Verified: Yes activities of daily living that may Secondary Verification Process Yes affect Completed: risk of falls: Patient Requires Transmission- No Signs or symptoms of abuse/neglect No Based Precautions: since last visito Patient Has Alerts: Yes Hospitalized since last visit: No Patient Alerts: L ABI non- Has Dressing in Place as Yes compressible Prescribed: R ABI non- Has Compression in Place as Yes compressible Prescribed: Pain Present Now: Unable to Respond Electronic Signature(s) Signed: 01/28/2017 7:50:03 AM By: [name] Entered By: [name] on 01/27/2017 15:39:38 Clontz, [name] ([id number]) -------------------------------------------------------------------------------- Encounter Discharge Information Details Patient Name: [name] Babe L. Date of Service: 01/27/2017 3:30 PM Medical Record Number: [id number] Patient Account Number: [id number] Date of Birth/Sex: Dec 01, 1920 (83 y.o. Female) Treating RN: Ashok Cordia, Debi Primary Care Jerianne Anselmo: [name] Other Clinician: Referring Olivya Sobol: [name] Treating  Brilynn Biasi/Extender: Linwood Dibbles, HOYT Weeks in Treatment: 7 Encounter Discharge Information Items Discharge Condition: Stable Ambulatory Status: Wheelchair Discharge Destination: Home Transportation: Private Auto Accompanied By: son Schedule Follow-up Appointment: Yes Medication Reconciliation completed and provided to Patient/Care No Naser Schuld: Provided on Clinical Summary of Care: 01/27/2017 Form Type Recipient Paper Patient ES Electronic Signature(s) Signed: 01/27/2017 5:09:32 PM By: Gwenlyn Perking Entered By: Gwenlyn Perking on 01/27/2017 17:09:31 Hird, [name] ([id number]) -------------------------------------------------------------------------------- Lower Extremity Assessment Details Patient Name: [name], [name] Date of Service: 01/27/2017 3:30 PM Medical Record Number: [id number] Patient Account Number: [id number] Date of Birth/Sex: 1920/11/03 (83 y.o. Female) Treating RN: Ashok Cordia, Debi Primary Care Marston Mccadden: [name] Other Clinician: Referring Pansy Ostrovsky: [name] Treating Jessilyn Catino/Extender: Linwood Dibbles, HOYT Weeks in Treatment: 7 Vascular Assessment Pulses: Dorsalis Pedis Palpable: [Left:Yes] [Right:Yes] Posterior Tibial Extremity colors, hair growth, and conditions: Extremity Color: [Left:Mottled] [Right:Mottled] Temperature of Extremity: [Left:Warm] [Right:Warm] Capillary Refill: [Left:> 3 seconds] [Right:> 3 seconds] Toe Nail Assessment Left: Right: Thick: Yes Yes Discolored: Yes Yes Deformed: Yes Yes Improper Length and Hygiene: Yes Yes Electronic Signature(s) Signed: 01/28/2017 7:50:03 AM By: [name] Entered By: [name] on 01/27/2017 16:17:08 [name] ([id number]) -------------------------------------------------------------------------------- Multi Wound Chart Details Patient Name: [name], [name] Date of Service: 01/27/2017 3:30 PM Medical Record Number: [id number] Patient Account Number:  [id number] Date of Birth/Sex: [birth year] (83 y.o. Female) Treating RN: Ashok Cordia, Debi Primary Care Merelyn Klump: [name] Other Clinician: Referring Renee Erb: [name] Treating Octaviano Mukai/Extender: STONE III, HOYT Weeks in Treatment: 7 Vital Signs Height(in): 69 Pulse(bpm): 93 Weight(lbs): 105 Blood Pressure 144/56 (mmHg): Body Mass Index(BMI): 16 Temperature(F): 98.2 Respiratory Rate 16 (breaths/min): Photos: [10:No Photos] [11:No Photos] [5:No Photos] Wound Location: [10:Right, Dorsal Foot] [11:Right Metatarsal head fifth Right Calcaneus - Plantar] Wounding Event: [10:Pressure Injury] [11:Pressure Injury] [5:Pressure Injury] Primary Etiology: [10:Pressure Ulcer] [11:Pressure Ulcer] [5:Pressure Ulcer] Comorbid History: [10:Cataracts, Anemia, Hypertension, History of pressure wounds, Osteoarthritis, Dementia] [11:Cataracts, Anemia, Hypertension, History of Hypertension, History of pressure wounds, Osteoarthritis, Dementia Osteoarthritis, Dementia]  [5:Cataracts, Anemia, pressure wounds,] Date Acquired: [10:01/25/2017] [  11:01/22/2017] [5:10/09/2016] Weeks of Treatment: [10:0] [11:0] [5:7] Wound Status: [10:Open] [11:Open] [5:Open] Clustered Wound: [10:No] [11:No] [5:Yes] Clustered Quantity: [10:N/A] [11:N/A] [5:2] Pending Amputation on No [11:No] [5:Yes] Presentation: Measurements L x W x D 1.5x2x0.1 [11:0.4x0.4x0.3] [5:6x7.4x0.4] (cm) Area (cm) : [10:2.356] [11:0.126] [5:34.872] Volume (cm) : [10:0.236] [11:0.038] [5:13.949] % Reduction in Area: [10:N/A] [11:0.00%] [5:26.00%] % Reduction in Volume: N/A [11:0.00%] [5:-196.00%] Starting Position 1 [11:12] (o'clock): Ending Position 1 [11:6] (o'clock): Maximum Distance 1 [11:1] (cm): Undermining: [10:No] [11:Yes] [5:No] Classification: [10:Category/Stage II] [11:Category/Stage II] [5:Category/Stage III] Exudate Amount: Large Large Large Exudate Type: Serosanguineous Serosanguineous Purulent Exudate Color: red,  brown red, brown yellow, brown, green Foul Odor After No No Yes Cleansing: Odor Anticipated Due to N/A N/A No Product Use: Wound Margin: Distinct, outline attached Distinct, outline attached Distinct, outline attached Granulation Amount: Small (1-33%) Medium (34-66%) Small (1-33%) Granulation Quality: Red Red Red Necrotic Amount: Large (67-100%) Medium (34-66%) Large (67-100%) Necrotic Tissue: Eschar, [name] Slough [name] [name] Periwound Skin Texture: No Abnormalities Noted No Abnormalities Noted No Abnormalities Noted Periwound Skin No Abnormalities Noted No Abnormalities Noted No Abnormalities Noted Moisture: Periwound Skin Color: Erythema: Yes Erythema: Yes Erythema: Yes Rubor: Yes Rubor: Yes Ecchymosis: No Erythema Location: Circumferential Circumferential Circumferential Temperature: No Abnormality N/A No Abnormality Tenderness on Yes No Yes Palpation: Wound Preparation: Ulcer Cleansing: Ulcer Cleansing: Ulcer Cleansing: Rinsed/Irrigated with Rinsed/Irrigated with Rinsed/Irrigated with Saline Saline Saline Topical Anesthetic Topical Anesthetic Topical Anesthetic Applied: Other: lidocaine Applied: Other: lidocaine Applied: Other: lidocaine 4% 4% 4% Wound Number: [id number]  Photos: No Photos No Photos No Photos Wound Location: Left Calcaneus Left Malleolus - Lateral Left Lower Leg - Posterior Wounding Event: Pressure Injury Pressure Injury Pressure Injury Primary Etiology: Pressure Ulcer Pressure Ulcer Pressure Ulcer Comorbid History: Cataracts, Anemia, Cataracts, Anemia, Cataracts, Anemia, Hypertension, History of Hypertension, History of Hypertension, History of pressure wounds, pressure wounds, pressure wounds, Osteoarthritis, Dementia Osteoarthritis, Dementia Osteoarthritis, Dementia Date Acquired: 10/09/2016 10/09/2016 10/09/2016 Weeks of Treatment: 7 7 7  Wound Status: Open Open Open Clustered Wound: No No  Yes Clustered Quantity: N/A N/A N/A Pending Amputation on Yes Yes Yes Presentation: Measurements L x W x D 2.2x2.8x0.6 1.4x1x0.1 5.5x2.5x0.5 (cm) Area (cm) : 4.838 1.1 10.799 [name], [name] ([id number]) Volume (cm) : 2.903 0.11 5.4 % Reduction in Area: 71.30% 69.70% 10.10% % Reduction in Volume: -71.90% 84.80% 25.10% Undermining: No No No Classification: Category/Stage II Category/Stage II Category/Stage III Exudate Amount: Medium Large Large Exudate Type: Purulent Serosanguineous Purulent Exudate Color: yellow, brown, green red, brown yellow, brown, green Foul Odor After Yes Yes Yes Cleansing: Odor Anticipated Due to No No No Product Use: Wound Margin: Distinct, outline attached Distinct, outline attached Distinct, outline attached Granulation Amount: Small (1-33%) Large (67-100%) Medium (34-66%) Granulation Quality: Red Red Red Necrotic Amount: Large (67-100%) Small (1-33%) Medium (34-66%) Necrotic Tissue: Eschar, [name] Slough [name] [name] Periwound Skin Texture: No Abnormalities Noted No Abnormalities Noted No Abnormalities Noted Periwound Skin No Abnormalities Noted No Abnormalities Noted No Abnormalities Noted Moisture: Periwound Skin Color: Erythema: Yes Erythema: Yes Erythema: Yes Erythema Location: Circumferential Circumferential Circumferential Temperature: No Abnormality No Abnormality No Abnormality Tenderness on Yes Yes Yes Palpation: Wound Preparation: Ulcer Cleansing: Ulcer Cleansing: Ulcer Cleansing: Rinsed/Irrigated with Rinsed/Irrigated with Rinsed/Irrigated with Saline Saline Saline Topical Anesthetic Topical Anesthetic Topical Anesthetic Applied: Other: lidocaine Applied: Other: lidocaine Applied: Other: lidocaine 4% 4% 4% Wound Number: 9 N/A N/A Photos: No Photos N/A N/A Wound Location:  Right, Lateral Lower Leg N/A N/A Wounding Event: Pressure Injury N/A N/A Primary Etiology:  Pressure Ulcer N/A N/A Comorbid History: N/A N/A N/A Date Acquired: 12/23/2016 N/A N/A Weeks of Treatment: 5 N/A N/A Wound Status: Healed - Epithelialized N/A N/A Clustered Wound: No N/A N/A Clustered Quantity: N/A N/A N/A Pending Amputation on No N/A N/A Presentation: Measurements L x W x D 0x0x0 N/A N/A (cm) [name], [name] ([id number]) Area (cm) : 0 N/A N/A Volume (cm) : 0 N/A N/A % Reduction in Area: 100.00% N/A N/A % Reduction in Volume: 100.00% N/A N/A Undermining: N/A N/A N/A Classification: Category/Stage II N/A N/A Exudate Amount: N/A N/A N/A Exudate Type: N/A N/A N/A Exudate Color: N/A N/A N/A Foul Odor After N/A N/A N/A Cleansing: Odor Anticipated Due to N/A N/A N/A Product Use: Wound Margin: N/A N/A N/A Granulation Amount: N/A N/A N/A Granulation Quality: N/A N/A N/A Necrotic Amount: N/A N/A N/A Necrotic Tissue: N/A N/A N/A Epithelialization: N/A N/A N/A Periwound Skin Texture: No Abnormalities Noted N/A N/A Periwound Skin No Abnormalities Noted N/A N/A Moisture: Periwound Skin Color: No Abnormalities Noted N/A N/A Erythema Location: N/A N/A N/A Temperature: N/A N/A N/A Tenderness on No N/A N/A Palpation: Wound Preparation: N/A N/A N/A Treatment Notes Electronic Signature(s) Signed: 01/28/2017 7:50:03 AM By: Alejandro MullingPinkerton, Debra Entered By: Alejandro MullingPinkerton, Debra on 01/27/2017 16:17:28 [name], [name] SeatsELIZABETH L. ([id number]) -------------------------------------------------------------------------------- Multi-Disciplinary Care Plan Details Patient Name: [name], [name] Date of Service: 01/27/2017 3:30 PM Medical Record Number: [id number] Patient Account Number: [id number] Date of Birth/Sex: [birth year] (83 y.o. Female) Treating RN: Ashok CordiaPinkerton, Debi Primary Care Jackee Glasner: Antony HasteBADGER, [name] Other Clinician: Referring Gargi Berch: Antony HasteBADGER, [name] Treating Matsue Strom/Extender: Linwood DibblesSTONE III, HOYT Weeks in Treatment: 7 Active Inactive ` Abuse / Safety / Falls / Self  Care Management Nursing Diagnoses: Potential for falls Goals: Patient will not experience any injury related to falls Date Initiated: 12/09/2016 Target Resolution Date: 02/20/2017 Goal Status: Active Interventions: Assess fall risk on admission and as needed Notes: ` Nutrition Nursing Diagnoses: Imbalanced nutrition Potential for alteratiion in Nutrition/Potential for imbalanced nutrition Goals: Patient/caregiver agrees to and verbalizes understanding of need to use nutritional supplements and/or vitamins as prescribed Date Initiated: 12/09/2016 Target Resolution Date: 03/27/2017 Goal Status: Active Interventions: Assess patient nutrition upon admission and as needed per policy Notes: ` Orientation to the Wound Care Program Nursing Diagnoses: [name], [name] ([id number]) Knowledge deficit related to the wound healing center program Goals: Patient/caregiver will verbalize understanding of the Wound Healing Center Program Date Initiated: 12/09/2016 Target Resolution Date: 12/26/2016 Goal Status: Active Interventions: Provide education on orientation to the wound center Notes: ` Pain, Acute or Chronic Nursing Diagnoses: Pain, acute or chronic: actual or potential Potential alteration in comfort, pain Goals: Patient/caregiver will verbalize adequate pain control between visits Date Initiated: 12/09/2016 Target Resolution Date: 03/27/2017 Goal Status: Active Interventions: Assess comfort goal upon admission Complete pain assessment as per visit requirements Notes: ` Pressure Nursing Diagnoses: Knowledge deficit related to causes and risk factors for pressure ulcer development Knowledge deficit related to management of pressures ulcers Potential for impaired tissue integrity related to pressure, friction, moisture, and shear Goals: Patient/caregiver will verbalize risk factors for pressure ulcer development Date Initiated: 12/09/2016 Target Resolution Date:  03/27/2017 Goal Status: Active Interventions: Assess: immobility, friction, shearing, incontinence upon admission and as needed Assess offloading mechanisms upon admission and as needed [name], [name] ([id number]) Notes: ` Wound/Skin Impairment Nursing Diagnoses: Impaired tissue integrity Knowledge deficit related to ulceration/compromised skin integrity Goals: Ulcer/skin breakdown will have a volume reduction of  80% by week 12 Date Initiated: 12/09/2016 Target Resolution Date: 03/20/2017 Goal Status: Active Interventions: Assess patient/caregiver ability to perform ulcer/skin care regimen upon admission and as needed Notes: Electronic Signature(s) Signed: 01/28/2017 7:50:03 AM By: [name] Entered By: [name] on 01/27/2017 16:17:18 Nair, [name] ([id number]) -------------------------------------------------------------------------------- Non-Wound Condition Assessment Details Patient Name: Rallo, Stori L. Date of Service: 01/27/2017 3:30 PM Medical Record Number: [id number] Patient Account Number: [id number] Date of Birth/Sex: [birth year] (83 y.o. Female) Treating RN: Ashok Cordia, Debi Primary Care Vasily Fedewa: [name] Other Clinician: Referring Audrick Lamoureaux: [name] Treating Treyven Lafauci/Extender: STONE III, HOYT Weeks in Treatment: 7 Non-Wound Condition: Condition: Suspected Deep Tissue Injury Location: Other: left plantar 5th metatarsal head Side: Left Photos Periwound Skin Texture Texture Color No Abnormalities Noted: No No Abnormalities Noted: No Moisture No Abnormalities Noted: No Electronic Signature(s) Signed: 01/28/2017 7:50:03 AM By: [name] Entered By: [name] on 01/27/2017 17:28:15 Jamar, [name] ([id number]) -------------------------------------------------------------------------------- Pain Assessment Details [name] Babe Date of Service: 01/27/2017 3:30 PM Patient Name: [name] Account  Number: [id number] Medical Record Treating RN: [name] [id number] Number: Other Clinician: Date of Birth/Sex: 03/13/1921 (83 y.o. Female) Treating [name], [name] Primary Care Walfred Bettendorf: [name] Adonis Yim/Extender: G Referring Markees Carns: [name] in Treatment: 7 Active Problems Location of Pain Severity and Description of Pain Patient Has Paino Patient Unable to Respond Site Locations Pain Management and Medication Current Pain Management: Electronic Signature(s) Signed: 01/28/2017 7:50:03 AM By: [name] Entered By: [name] on 01/27/2017 15:39:43 Philson, [name] ([id number]) -------------------------------------------------------------------------------- Patient/Caregiver Education Details Patient Name: [name] Babe L. Date of Service: 01/27/2017 3:30 PM Medical Record Number: [id number] Patient Account Number: [id number] Date of Birth/Gender: 05-01-1921 (83 y.o. Female) Treating RN: Ashok Cordia, Debi Primary Care Physician: [name] Other Clinician: Referring Physician: [name] Treating Physician/Extender: Skeet Simmer in Treatment: 7 Education Assessment Education Provided To: Caregiver son Education Topics Provided Wound/Skin Impairment: Handouts: Other: change dressing as ordered Methods: Demonstration, Explain/Verbal Responses: State content correctly Electronic Signature(s) Signed: 01/28/2017 7:50:03 AM By: [name] Entered By: [name] on 01/27/2017 16:18:17 Landry, [name] ([id number]) -------------------------------------------------------------------------------- Wound Assessment Details Patient Name: Leon, Keshayla L. Date of Service: 01/27/2017 3:30 PM Medical Record Number: [id number] Patient Account Number: [id number] Date of Birth/Sex: [birth year] (83 y.o. Female) Treating RN: Ashok Cordia, Debi Primary Care Jania Steinke: [name] Other Clinician: Referring  Latravion Graves: [name] Treating Gladine Plude/Extender: STONE III, HOYT Weeks in Treatment: 7 Wound Status Wound Number: 10 Primary Pressure Ulcer Etiology: Wound Location: Right Foot - Dorsal Wound Open Wounding Event: Pressure Injury Status: Date Acquired: 01/25/2017 Comorbid Cataracts, Anemia, Hypertension, Weeks Of Treatment: 0 History: History of pressure wounds, Clustered Wound: No Osteoarthritis, Dementia Photos Wound Measurements Length: (cm) 1.5 Width: (cm) 2 Depth: (cm) 0.1 Area: (cm) 2.356 Volume: (cm) 0.236 % Reduction in Area: 0% % Reduction in Volume: 0% Epithelialization: [name] Tunneling: No Undermining: No Wound Description Classification: Category/Stage II Foul Odor Aft Wound Margin: Distinct, outline attached Slough/Fibrin Exudate Amount: Large Exudate Type: Serosanguineous Exudate Color: red, brown er Cleansing: No o Yes Wound Bed Granulation Amount: Small (1-33%) Granulation Quality: Red Necrotic Amount: Large (67-100%) Necrotic Quality: Eschar, [name] [address] Nunda L. ([id number]) Periwound Skin Texture Texture Color No Abnormalities Noted: No No Abnormalities Noted: No Erythema: Yes Moisture Erythema Location: Circumferential No Abnormalities Noted: No Hemosiderin Staining: Yes Rubor: Yes Temperature / Pain Temperature: No Abnormality Tenderness on Palpation: Yes Wound Preparation Ulcer Cleansing: Rinsed/Irrigated with Saline Topical Anesthetic Applied: Other: lidocaine 4%, Electronic Signature(s) Signed: 01/28/2017 7:50:03 AM By:  [name] Entered By: [name] on 01/27/2017 17:28:58 Sroka, [name] ([id number]) -------------------------------------------------------------------------------- Wound Assessment Details Patient Name: GERLENE, GLASSBURN L. Date of Service: 01/27/2017 3:30 PM Medical Record Number: [id number] Patient Account Number: [id number] Date of Birth/Sex: 1920-11-27 (83 y.o.  Female) Treating RN: Ashok Cordia, Debi Primary Care Larry Alcock: [name] Other Clinician: Referring Rukaya Kleinschmidt: [name] Treating Radha Coggins/Extender: STONE III, HOYT Weeks in Treatment: 7 Wound Status Wound Number: 11 Primary Pressure Ulcer Etiology: Wound Location: Right Metatarsal head fifth - Plantar Wound Open Status: Wounding Event: Pressure Injury Comorbid Cataracts, Anemia, Hypertension, Date Acquired: 01/22/2017 History: History of pressure wounds, Weeks Of Treatment: 0 Osteoarthritis, Dementia Clustered Wound: No Photos Photo Uploaded By: [name] on 01/27/2017 17:21:25 Wound Measurements Length: (cm) 0.4 Width: (cm) 0.4 Depth: (cm) 0.3 Area: (cm) 0.126 Volume: (cm) 0.038 % Reduction in Area: 0% % Reduction in Volume: 0% Epithelialization: [name] Tunneling: No Undermining: Yes Starting Position (o'clock): 12 Ending Position (o'clock): 6 Maximum Distance: (cm) 1 Wound Description Classification: Category/Stage II Foul Odor Aft Wound Margin: Distinct, outline attached Slough/Fibrin Exudate Amount: Large Exudate Type: Serosanguineous Exudate Color: red, brown er Cleansing: No o No Wound Bed Glade, Kyliana L. ([id number]) Granulation Amount: Medium (34-66%) Granulation Quality: Red Necrotic Amount: Medium (34-66%) Necrotic Quality: [name] Slough Periwound Skin Texture Texture Color No Abnormalities Noted: No No Abnormalities Noted: No Erythema: Yes Moisture Erythema Location: Circumferential No Abnormalities Noted: No Rubor: Yes Wound Preparation Ulcer Cleansing: Rinsed/Irrigated with Saline Topical Anesthetic Applied: Other: lidocaine 4%, Electronic Signature(s) Signed: 01/28/2017 7:50:03 AM By: [name] Entered By: [name] on 01/27/2017 16:02:02 Farfan, [name] -------------------------------------------------------------------------------- Wound Assessment Details Patient Name: Lemarr,  Lyndie L. Date of Service: 01/27/2017 3:30 PM Medical Record Number: [id number] Patient Account Number: [id number] Date of Birth/Sex: 1921/01/28 (83 y.o. Female) Treating RN: Ashok Cordia, Debi Primary Care Keva Darty: [name] Other Clinician: Referring Dontray Haberland: [name] Treating Amrie Gurganus/Extender: STONE III, HOYT Weeks in Treatment: 7 Wound Status Wound Number: 5 Primary Pressure Ulcer Etiology: Wound Location: Right Calcaneus Wound Open Wounding Event: Pressure Injury Status: Date Acquired: 10/09/2016 Comorbid Cataracts, Anemia, Hypertension, Weeks Of Treatment: 7 History: History of pressure wounds, Clustered Wound: Yes Osteoarthritis, Dementia Pending Amputation On Presentation Photos Photo Uploaded By: [name] on 01/27/2017 17:24:44 Wound Measurements Length: (cm) 6 % Reduction i Width: (cm) 7.4 % Reduction i Depth: (cm) 0.4 Epithelializa Clustered Quantity: 2 Tunneling: Area: (cm) 34.872 Undermining: Volume: (cm) 13.949 n Area: 26% n Volume: -196% tion: [name] No No Wound Description Classification: Category/Stage III Foul Odor Aft Wound Margin: Distinct, outline attached Due to Produc Exudate Amount: Large Slough/Fibrin Exudate Type: Purulent Exudate Color: yellow, brown, green er Cleansing: Yes t Use: No o Yes Wound Bed Granulation Amount: Small (1-33%) Granulation Quality: Red Fries, Yekaterina L. ([id number]) Necrotic Amount: Large (67-100%) Necrotic Quality: Eschar, [name] Slough Periwound Skin Texture Texture Color No Abnormalities Noted: No No Abnormalities Noted: No Ecchymosis: No Moisture Erythema: Yes No Abnormalities Noted: No Erythema Location: Circumferential Temperature / Pain Temperature: No Abnormality Tenderness on Palpation: Yes Wound Preparation Ulcer Cleansing: Rinsed/Irrigated with Saline Topical Anesthetic Applied: Other: lidocaine 4%, Electronic Signature(s) Signed: 01/28/2017 7:50:03 AM By:  [name] Entered By: [name] on 01/27/2017 16:08:06 Odoherty, [name] ([id number]) -------------------------------------------------------------------------------- Wound Assessment Details Patient Name: Chenault, Janith L. Date of Service: 01/27/2017 3:30 PM Medical Record Number: [id number] Patient Account Number: [id number] Date of Birth/Sex: 10-May-1921 (83 y.o. Female) Treating RN: Ashok Cordia, Debi Primary Care Terreon Ekholm: [name] Other Clinician: Referring Arneisha Kincannon: [name] Treating Santia Labate/Extender: Linwood Dibbles, HOYT  Weeks in Treatment: 7 Wound Status Wound Number: 6 Primary Pressure Ulcer Etiology: Wound Location: Left Calcaneus Wound Open Wounding Event: Pressure Injury Status: Date Acquired: 10/09/2016 Comorbid Cataracts, Anemia, Hypertension, Weeks Of Treatment: 7 History: History of pressure wounds, Clustered Wound: No Osteoarthritis, Dementia Pending Amputation On Presentation Photos Photo Uploaded By: [name] on 01/27/2017 17:25:17 Wound Measurements Length: (cm) 2.2 Width: (cm) 2.8 Depth: (cm) 0.6 Area: (cm) 4.838 Volume: (cm) 2.903 % Reduction in Area: 71.3% % Reduction in Volume: -71.9% Epithelialization: [name] Tunneling: No Undermining: No Wound Description Classification: Category/Stage II Foul Odor Aft Wound Margin: Distinct, outline attached Due to Produc Exudate Amount: Medium Slough/Fibrin Exudate Type: Purulent Exudate Color: yellow, brown, green er Cleansing: Yes t Use: No o Yes Wound Bed Granulation Amount: Small (1-33%) Granulation Quality: Red Necrotic Amount: Large (67-100%) Keil, Sheronica L. ([id number]) Necrotic Quality: Eschar, [name] Slough Periwound Skin Texture Texture Color No Abnormalities Noted: No No Abnormalities Noted: No Erythema: Yes Moisture Erythema Location: Circumferential No Abnormalities Noted: No Temperature / Pain Temperature: No Abnormality Tenderness on  Palpation: Yes Wound Preparation Ulcer Cleansing: Rinsed/Irrigated with Saline Topical Anesthetic Applied: Other: lidocaine 4%, Electronic Signature(s) Signed: 01/28/2017 7:50:03 AM By: [name] Entered By: [name] on 01/27/2017 16:06:23 Carlile, [name] ([id number]) -------------------------------------------------------------------------------- Wound Assessment Details Patient Name: Jager, Zalika L. Date of Service: 01/27/2017 3:30 PM Medical Record Number: [id number] Patient Account Number: [id number] Date of Birth/Sex: [birth year] (83 y.o. Female) Treating RN: Ashok Cordia, Debi Primary Care Rosalinda Seaman: [name] Other Clinician: Referring Shamekia Tippets: [name] Treating Rui Wordell/Extender: STONE III, HOYT Weeks in Treatment: 7 Wound Status Wound Number: 7 Primary Pressure Ulcer Etiology: Wound Location: Left Malleolus - Lateral Wound Open Wounding Event: Pressure Injury Status: Date Acquired: 10/09/2016 Comorbid Cataracts, Anemia, Hypertension, Weeks Of Treatment: 7 History: History of pressure wounds, Clustered Wound: No Osteoarthritis, Dementia Pending Amputation On Presentation Photos Photo Uploaded By: [name] on 01/27/2017 17:23:46 Wound Measurements Length: (cm) 1.4 Width: (cm) 1 Depth: (cm) 0.1 Area: (cm) 1.1 Volume: (cm) 0.11 % Reduction in Area: 69.7% % Reduction in Volume: 84.8% Epithelialization: [name] Tunneling: No Undermining: No Wound Description Classification: Category/Stage II Foul Odor Aft Wound Margin: Distinct, outline attached Due to Produc Exudate Amount: Large Slough/Fibrin Exudate Type: Serosanguineous Exudate Color: red, brown er Cleansing: Yes t Use: No o Yes Wound Bed Granulation Amount: Large (67-100%) Granulation Quality: Red Necrotic Amount: Small (1-33%) Falero, Kiah L. ([id number]) Necrotic Quality: [name] Slough Periwound Skin Texture Texture Color No Abnormalities Noted:  No No Abnormalities Noted: No Erythema: Yes Moisture Erythema Location: Circumferential No Abnormalities Noted: No Temperature / Pain Temperature: No Abnormality Tenderness on Palpation: Yes Wound Preparation Ulcer Cleansing: Rinsed/Irrigated with Saline Topical Anesthetic Applied: Other: lidocaine 4%, Electronic Signature(s) Signed: 01/28/2017 7:50:03 AM By: [name] Entered By: [name] on 01/27/2017 16:02:56 Demartini, [name] ([id number]) -------------------------------------------------------------------------------- Wound Assessment Details Patient Name: Amparan, Riyanna L. Date of Service: 01/27/2017 3:30 PM Medical Record Number: [id number] Patient Account Number: [id number] Date of Birth/Sex: February 22, 1921 (83 y.o. Female) Treating RN: Ashok Cordia, Debi Primary Care Ab Leaming: [name] Other Clinician: Referring Shakelia Scrivner: [name] Treating Toan Mort/Extender: STONE III, HOYT Weeks in Treatment: 7 Wound Status Wound Number: 8 Primary Pressure Ulcer Etiology: Wound Location: Left Lower Leg - Posterior Wound Open Wounding Event: Pressure Injury Status: Date Acquired: 10/09/2016 Comorbid Cataracts, Anemia, Hypertension, Weeks Of Treatment: 7 History: History of pressure wounds, Clustered Wound: Yes Osteoarthritis, Dementia Pending Amputation On Presentation Photos Photo Uploaded By: [name] on 01/27/2017 17:23:46 Wound Measurements Length: (cm) 5.5 Width: (cm)  2.5 Depth: (cm) 0.5 Area: (cm) 10.799 Volume: (cm) 5.4 % Reduction in Area: 10.1% % Reduction in Volume: 25.1% Epithelialization: [name] Tunneling: No Undermining: No Wound Description Classification: Category/Stage III Foul Odor Aft Wound Margin: Distinct, outline attached Due to Produc Exudate Amount: Large Slough/Fibrin Exudate Type: Purulent Exudate Color: yellow, brown, green er Cleansing: Yes t Use: No o Yes Wound Bed Granulation Amount: Medium  (34-66%) Granulation Quality: Red Necrotic Amount: Medium (34-66%) Tweten, Kynlei L. ([id number]) Necrotic Quality: Eschar, [name] Slough Periwound Skin Texture Texture Color No Abnormalities Noted: No No Abnormalities Noted: No Erythema: Yes Moisture Erythema Location: Circumferential No Abnormalities Noted: No Temperature / Pain Temperature: No Abnormality Tenderness on Palpation: Yes Wound Preparation Ulcer Cleansing: Rinsed/Irrigated with Saline Topical Anesthetic Applied: Other: lidocaine 4%, Electronic Signature(s) Signed: 01/28/2017 7:50:03 AM By: [name] Entered By: [name] on 01/27/2017 16:04:28 Struthers, [name] ([id number]) -------------------------------------------------------------------------------- Wound Assessment Details Patient Name: Wickwire, Latoshia L. Date of Service: 01/27/2017 3:30 PM Medical Record Number: [id number] Patient Account Number: [id number] Date of Birth/Sex: 1921-01-28 (83 y.o. Female) Treating RN: Ashok Cordia, Debi Primary Care Elba Schaber: [name] Other Clinician: Referring Athene Schuhmacher: [name] Treating Kiyana Vazguez/Extender: STONE III, HOYT Weeks in Treatment: 7 Wound Status Wound Number: 9 Primary Etiology: Pressure Ulcer Wound Location: Right, Lateral Lower Leg Wound Status: Healed - Epithelialized Wounding Event: Pressure Injury Date Acquired: 12/23/2016 Weeks Of Treatment: 5 Clustered Wound: No Photos Photo Uploaded By: [name] on 01/27/2017 17:23:00 Wound Measurements Length: (cm) 0 % Reduction i Width: (cm) 0 % Reduction i Depth: (cm) 0 Area: (cm) 0 Volume: (cm) 0 n Area: 100% n Volume: 100% Wound Description Classification: Category/Stage II Periwound Skin Texture Texture Color No Abnormalities Noted: No No Abnormalities Noted: No Moisture No Abnormalities Noted: No Electronic Signature(s) Signed: 01/28/2017 7:50:03 AM By: Krystal Eaton, [name]  ([id number]) Entered By: [name] on 01/27/2017 15:54:51 Zechman, [name] ([id number]) -------------------------------------------------------------------------------- Vitals Details [name] Babe Date of Service: 01/27/2017 3:30 PM Patient Name: [name] Account Number: [id number] Medical Record Treating RN: [name] [id number] Number: Other Clinician: Date of Birth/Sex: [birth year] (83 y.o. Female) Treating [name], [name] Primary Care Collin Rengel: [name] Lyncoln Ledgerwood/Extender: G Referring Ladon Vandenberghe: [name] Weeks in Treatment: 7 Vital Signs Time Taken: 15:39 Temperature (F): 98.2 Height (in): 69 Pulse (bpm): 93 Weight (lbs): 105 Respiratory Rate (breaths/min): 16 Body Mass Index (BMI): 15.5 Blood Pressure (mmHg): 144/56 Reference Range: 80 - 120 mg / dl Electronic Signature(s) Signed: 01/28/2017 7:50:03 AM By: [name] Entered By: [name] on 01/27/2017 15:43:18

## 2017-01-30 LAB — AEROBIC CULTURE W GRAM STAIN (SUPERFICIAL SPECIMEN)

## 2017-01-30 LAB — AEROBIC CULTURE  (SUPERFICIAL SPECIMEN)

## 2017-02-03 ENCOUNTER — Other Ambulatory Visit: Payer: Self-pay | Admitting: Internal Medicine

## 2017-02-03 ENCOUNTER — Encounter: Payer: Medicare Other | Admitting: Internal Medicine

## 2017-02-03 ENCOUNTER — Ambulatory Visit
Admission: RE | Admit: 2017-02-03 | Discharge: 2017-02-03 | Disposition: A | Discharge status: Home / Self Care | Payer: Medicare Other | Source: Ambulatory Visit | Attending: Internal Medicine | Admitting: Internal Medicine

## 2017-02-03 DIAGNOSIS — M19071 Primary osteoarthritis, right ankle and foot: Secondary | ICD-10-CM | POA: Diagnosis not present

## 2017-02-03 DIAGNOSIS — S81809A Unspecified open wound, unspecified lower leg, initial encounter: Secondary | ICD-10-CM | POA: Diagnosis present

## 2017-02-03 DIAGNOSIS — I739 Peripheral vascular disease, unspecified: Secondary | ICD-10-CM | POA: Insufficient documentation

## 2017-02-03 DIAGNOSIS — M8580 Other specified disorders of bone density and structure, unspecified site: Secondary | ICD-10-CM | POA: Insufficient documentation

## 2017-02-03 DIAGNOSIS — M2011 Hallux valgus (acquired), right foot: Secondary | ICD-10-CM | POA: Diagnosis not present

## 2017-02-03 DIAGNOSIS — X58XXXA Exposure to other specified factors, initial encounter: Secondary | ICD-10-CM | POA: Insufficient documentation

## 2017-02-03 DIAGNOSIS — L89623 Pressure ulcer of left heel, stage 3: Secondary | ICD-10-CM | POA: Diagnosis not present

## 2017-02-04 NOTE — Progress Notes (Signed)
[name], [name] ([id number]) Visit Report for 02/03/2017 Chief Complaint Document Details MELONI, HINZ Date of Service: 02/03/2017 8:00 AM Patient Name: [name] Account Number: [id number] Medical Record Treating RN: [name] [id number] Number: Other Clinician: 05/14/1921 (83 y.o. Treating [name] Date of Birth/Sex: Female) Provider/Extender: G Primary Care Provider: Antony [name] Referring Provider: Wallene [name] in Treatment: 8 Information Obtained from: Patient Chief Complaint Follow-up regarding right lateral calf wounds and right heel pressure injury Electronic Signature(s) Signed: 02/03/2017 4:47:30 PM By: [name] Najjar MD Entered By: [name] on 02/03/2017 09:20:21 [name], [name] ([id number]) -------------------------------------------------------------------------------- Debridement Details [name] Date of Service: 02/03/2017 8:00 AM Patient Name: [name] Account Number: [id number] Medical Record Treating RN: [name] [id number] Number: Other Clinician: 1920/08/12 (83 y.o. Treating [name] Date of Birth/Sex: Female) Provider/Extender: G Primary Care Provider: Antony [name] Referring Provider: Wallene [name] in Treatment: 8 Debridement Performed for Wound #10 Right,Dorsal Foot Assessment: Performed By: Physician [name] Caul, MD Debridement: Debridement Pre-procedure Verification/Time Out Yes - 08:53 Taken: Start Time: 08:54 Pain Control: Lidocaine 4% Topical Solution Level: Skin/Subcutaneous Tissue Total Area Debrided (L x 3 (cm) x 1.5 (cm) = 4.5 (cm) W): Tissue and other Viable, Non-Viable, Exudate, Fibrin/Slough, Subcutaneous material debrided: Instrument: Blade, Forceps Bleeding: Minimum Hemostasis Achieved: Pressure End Time: 08:56 Procedural Pain: 0 Post Procedural Pain: 0 Response to Treatment: Procedure was tolerated well Post Debridement Measurements of  Total Wound Length: (cm) 3 Stage: Category/Stage II Width: (cm) 1.5 Depth: (cm) 0.1 Volume: (cm) 0.353 Character of Wound/Ulcer Post Requires Further Debridement: Debridement Post Procedure Diagnosis Same as Pre-procedure Electronic Signature(s) Signed: 02/03/2017 4:47:30 PM By: [name] Najjar MD Signed: 02/03/2017 5:03:57 PM By: [name] ([id number]) Entered By: [name] on 02/03/2017 09:19:58 [name], [name] ([id number]) -------------------------------------------------------------------------------- Debridement Details [name] Date of Service: 02/03/2017 8:00 AM Patient Name: [name] Account Number: [id number] Medical Record Treating RN: [name] [id number] Number: Other Clinician: 1920-10-21 (83 y.o. Treating [name] Date of Birth/Sex: Female) Provider/Extender: G Primary Care Provider: Antony [name] Referring Provider: Wallene [name] in Treatment: 8 Debridement Performed for Wound #11 Right,Plantar Metatarsal head fifth Assessment: Performed By: Physician [name] Caul, MD Debridement: Debridement Pre-procedure Verification/Time Out Yes - 08:53 Taken: Start Time: 08:56 Pain Control: Lidocaine 4% Topical Solution Level: Skin/Subcutaneous Tissue Total Area Debrided (L x 1.5 (cm) x 0.6 (cm) = 0.9 (cm) W): Tissue and other Viable, Non-Viable, Exudate, Fibrin/Slough, Subcutaneous material debrided: Instrument: Blade, Forceps Bleeding: Minimum Hemostasis Achieved: Pressure End Time: 08:58 Procedural Pain: 0 Post Procedural Pain: 0 Response to Treatment: Procedure was tolerated well Post Debridement Measurements of Total Wound Length: (cm) 1.5 Stage: Category/Stage II Width: (cm) 0.6 Depth: (cm) 0.2 Volume: (cm) 0.141 Character of Wound/Ulcer Post Requires Further Debridement: Debridement Post Procedure Diagnosis Same as Pre-procedure Electronic Signature(s) Signed:  02/03/2017 4:47:30 PM By: [name] Najjar MD Signed: 02/03/2017 5:03:57 PM By: [name] ([id number]) Entered By: [name] on 02/03/2017 09:20:10 [name], [name] ([id number]) -------------------------------------------------------------------------------- HPI Details [name] Date of Service: 02/03/2017 8:00 AM Patient Name: [name] Account Number: [id number] Medical Record Treating RN: [name] [id number] Number: Other Clinician: September 23, 1920 (83 y.o. Treating [name] Date of Birth/Sex: Female) Provider/Extender: G Primary Care Provider: Antony [name] Referring Provider: Wallene [name] in Treatment: 8 History of Present Illness HPI Description: 03/24/16; this is an elderly 83 year old woman with advanced dementia who was hospitalized from 5/8 through 5/13. At that point she had Proteus sepsis felt to be  secondary to a UTI the. Acute kidney injury and delirium. Her son who is her primary caregiver says she came home from the hospital with wounds on her right lower extremity and apparently her right buttock as well. There is no mention on the hospital discharge summary of problems. She has been having [facility] and have been using silver alginate based dressings. Apparently the area on the right buttock is healing and the son stated we did not need to become involved with that. In any case the patient has for wounds to on the right heel and 2 on the posterior lateral right calf, the latter of which is not a usual pressure area however that is the history. She is apparently eating and drinking fairly well. Takes ensure well. She does not have any pressure-relief surface at home. I have reviewed her lab work from the hospital. Her admission albumin was 3.4 on 5/8 04/01/16; patient arrives with wounds looking much the same as last week. She has an extensive pressure area over her right heel and to small but deep  wounds on the lateral aspect of her right lower leg. These wounds are not connected. Although there are advertised his pressure ulcers these 2 wounds are not usual for pressure ulcerations. We have not looked at the pressure ulceration on her buttock at the request of the patient's son who is the primary caregiver 04/08/16; wounds are stable to improved this week. Culture I did of the lower wound on her right lateral leg grew methicillin sensitive staph aureus she is on Septra. We are using silver alginate to all the wounds. 04/15/16; the 2 small areas on the lateral aspect of this lady's right leg continued to improve however the heel is once again covered with callus, residual alginate, acrotic material all of which requires a difficult debridement. There continues to be purulent material under this surface. This is in spite of a week of Septra that I gave him for MSSA 04/22/16 Patient today presents for follow-up evaluation. She is seen with her son in the office at this point in time she does have Alzheimer's. The good news is her wounds appeared to be somewhat smaller with current therapy. At this point in time the infection which was cultured previously appears to have improved in regard to the right heel wound. There is no purulent drainage at this point in time and a good portion of the wound actually is eschar covered and dry with a medial portion actually open to granulation with very little slough covering. She really has no significant discomfort with palpation manipulation of the wound at this point in time. 04/28/16 patient presents today for follow-up evaluation concerning her right lateral calf wound as well as right heel wound. she has not really been complaining this for as pain is concerned according to her son seen with her in the office at this point in time today. She seems to show no interval signs or symptoms of infection overall has been tolerating the dressing changes well.  She does have Alzheimer's therefore is not able to rate her pain although she does respond with an affirmative that she hurts when I press over the Kulpa, Rick L. ([id number]) heel region. 05/05/16; the area on the lateral aspect of her right calf is healed. The right heel as 2 small spots that are still open and very close to resolving as well using Aquacel Ag under border foam, 05/20/16; the area on the lateral aspect of her right  calf remains closed. The 2 small areas on her right heel are also closed. READMISSION 12/09/16; this is a 83 year old woman with advanced dementia that we cared for in the clinic from September to the beginning of November 2017. At that point she had a right heel, right lateral calf and right buttock wound all felt to be secondary to pressure. These eventually closed over. The patient's son who is the primary caregiver states that over the last 2 months she is developed bilateral lower extremity pressure ulcers. [facility] has [facility] home and they have been applying Medihoney and an alginate. These were apparently all pressure ulcers today including area on the left posterior calf. Apparently they were keeping her leg elevated on the pillow which contributed to this. The patient has advanced dementia, is nonambulatory and not able to move herself in bed. There is apparently not a nutritional issue. [facility] has [facility]. 12/16/16; patient was readmitted to the clinic last week with multiple wounds including the medial and lateral aspects of the left calcaneus left posterior calf right calcaneus. We've been using Iodoflex, dressing change by home health. Her son also reports that she has had trouble breathing which she attributes to the low air loss mattress we have for pressure relief. He states that when he took her out of bed and put her in her chair breathing improved. As of Monday he took the low air loss mattress off the bed and states that  she is able to go to bed without shortness of breath. His description sounded a lot like orthopnea. He is asking [place] to order a gel mattress overlay. According to her son she does not aspirate at least not frequently 12/23/16; the patient is reviewed today for multiple wounds on her lower extremities. We have been using Iodoflex dressing change by home health. Last week the patient was Cheyne-Stokes in with an x-ray that look like heart failure with small bilateral pleural effusions. I gave her Lasix and some potassium. She saw her primary physician earlier this week who did not add anything to this. Her son states she is doing better she has not had any of the orthopneic episodes since Monday 01/06/17; 3 week followup. using iodoflex. She has a large wound on the left lateral calf with exposed tendon, and necrotic wound over the left medial heel. Wound over the left lateral malleolus appears to be improved. On the right side necrotic wound on the right medial heel, wound on the right lateral heel looks somewhat better and the area on the left anterior lateral calf looks as though it's progressing towards closure. The patient may have arterial issues however she is too far along in terms of her dementia to undergo testing. Her son claims she is eating well and there offloading this. 01/27/17 on evaluation today patient appears to be doing somewhat worse with new wounds over the right fifth metatarsal region laterally as well as the dorsal right foot. Both of these appear to be deep tissue injuries which have now opened over the two weeks since we last saw her. She also has a mildly tissue injury in the fifth metatarsal region on the left although this does not appear to be worsening and in fact I think wound up being okay. Nonetheless she does have skin in which is loose of the right heel as well as the left posterior calf region. She wears the [employer] when she is in bed but has not been  wearing  them when she is in her wheelchair. 02/03/17; culture that was done last week of the right dorsal foot grew both methicillin sensitive staph aureus Colford, [name] ([id number]) and Pseudomonas. She was initially thought to have Cipro however that up apparently increases QT along with Aricept was not aware that Aricept did this. This was changed to cefdinir and Septra however the patient is apparently allergic to Septra even though it is not listed in our records. The patient started the cefdinir yesterday. In spite of the delay of antibiotics the area on the right dorsal foot actually looks a lot better than last week with a thick covering eschar and necrotic tissue but no surrounding erythema or drainage. I also took the opportunity today to review her vascular status. When she was in our clinic the first time her ABI on the left was 0.9 and on the right noncompressible. It is still noncompressible today. She has not had imaging studies she has the following wounds including the right dorsal ankle/foot, right heel and right fifth metatarsal head. On the left side she has the medial left heel the left lateral malleolus and the left posterior lateral calf. We have been using Iodoflex on the right and Prisma on the left Electronic Signature(s) Signed: 02/03/2017 4:47:30 PM By: [name] Najjar MD Entered By: [name] on 02/03/2017 09:23:46 Fees, [name] ([id number]) -------------------------------------------------------------------------------- Physical Exam Details [name] Date of Service: 02/03/2017 8:00 AM Patient Name: [name] Account Number: [id number] Medical Record Treating RN: [name] [id number] Number: Other Clinician: 12-Jan-1921 (83 y.o. Treating Teyton Pattillo Date of Birth/Sex: Female) Provider/Extender: G Primary Care Provider: Antony [name] Referring Provider: Antony [name] Weeks in Treatment: 8 Constitutional Patient is  hypertensive.. Pulse regular and within target range for patient.Marland Kitchen Respirations regular, non-labored and within target range.. Temperature is normal and within the target range for the patient.. Very frail- looking woman. Eyes Conjunctivae clear. No discharge. Respiratory Respiratory effort is easy and symmetric bilaterally. Rate is normal at rest and on room air.. Cardiovascular Femoral pulses and popliteal pulses are palpable bilaterally.. Her dorsalis pedis pulses are definitely palpable bilaterally as well as the left posterior tibial.. Lymphatic None palpable in the popliteal or inguinal area. Psychiatric Advanced dementia. Notes Wound exam; really a very difficult situation however all her wounds are carefully identified today #1 on the right side she has a large necrotic eschar over her entire right heel. Thick necrotic surface over her right ankle/foot but no surrounding erythematous week and a necrotic wound with tunneling on the right fifth metatarsal head. No surrounding erythema in either wound. #2 on the left side she has an open wound on the medial left heel about 50% surface slough, smaller wound over the left lateral malleolus and an area with exposed tendon on the left posterior lateral calf. Again no surrounding erythema in any one of these areas Electronic Signature(s) Signed: 02/03/2017 4:47:30 PM By: [name] Najjar MD Entered By: [name] on 02/03/2017 09:27:12 Kohlbeck, [name] -------------------------------------------------------------------------------- Physician Orders Details [name] Date of Service: 02/03/2017 8:00 AM Patient Name: [name] Account Number: [id number] Medical Record Treating RN: [name] [id number] Number: Other Clinician: 05/06/1921 (83 y.o. Treating Olaf Mesa Date of Birth/Sex: Female) Provider/Extender: G Primary Care Provider: Antony [name] Referring Provider: Wallene [name]  in Treatment: 8 Verbal / Phone Orders: Yes Clinician: Ashok Cordia, Debi Read Back and Verified: Yes Diagnosis Coding Wound Cleansing Wound #10 Right,Dorsal Foot o Clean wound with Normal Saline. o Cleanse wound with mild soap  and water Wound #11 Right,Plantar Metatarsal head fifth o Clean wound with Normal Saline. o Cleanse wound with mild soap and water Wound #5 Right Calcaneus o Clean wound with Normal Saline. o Cleanse wound with mild soap and water Wound #6 Left Calcaneus o Clean wound with Normal Saline. o Cleanse wound with mild soap and water Wound #7 Left,Lateral Malleolus o Clean wound with Normal Saline. o Cleanse wound with mild soap and water Wound #8 Left,Posterior Lower Leg o Clean wound with Normal Saline. o Cleanse wound with mild soap and water Anesthetic Wound #10 Right,Dorsal Foot o Topical Lidocaine 4% cream applied to wound bed prior to debridement - for clinic use Wound #11 Right,Plantar Metatarsal head fifth o Topical Lidocaine 4% cream applied to wound bed prior to debridement - for clinic use Wound #5 Right Calcaneus o Topical Lidocaine 4% cream applied to wound bed prior to debridement - for clinic use TAYLYN, BRAME ([id number]) Wound #6 Left Calcaneus o Topical Lidocaine 4% cream applied to wound bed prior to debridement - for clinic use Wound #7 Left,Lateral Malleolus o Topical Lidocaine 4% cream applied to wound bed prior to debridement - for clinic use Wound #8 Left,Posterior Lower Leg o Topical Lidocaine 4% cream applied to wound bed prior to debridement - for clinic use Skin Barriers/Peri-Wound Care Wound #10 Right,Dorsal Foot o Skin Prep Wound #11 Right,Plantar Metatarsal head fifth o Skin Prep Wound #7 Left,Lateral Malleolus o Skin Prep Wound #8 Left,Posterior Lower Leg o Skin Prep Primary Wound Dressing Wound #10 Right,Dorsal Foot o Iodoflex Wound #11 Right,Plantar Metatarsal head  fifth o Iodoflex Wound #5 Right Calcaneus o Iodoflex Wound #6 Left Calcaneus o Iodoflex Wound #7 Left,Lateral Malleolus o Prisma Ag - moisten with saline or hydrogel Wound #8 Left,Posterior Lower Leg o Iodoflex Secondary Dressing Wound #10 Right,Dorsal Foot o Dry Gauze o Boardered Foam Dressing Wound #11 Right,Plantar Metatarsal head fifth Stockham, [name] ([id number]) o Dry Gauze o Boardered Foam Dressing Wound #5 Right Calcaneus o Dry Gauze o Conform/Kerlix o Other - allevyn heel cup, stretch netting #4 Wound #6 Left Calcaneus o Dry Gauze o Conform/Kerlix o Other - allevyn heel cup, stretch netting #4 Wound #7 Left,Lateral Malleolus o Dry Gauze o Boardered Foam Dressing Wound #8 Left,Posterior Lower Leg o Dry Gauze o Boardered Foam Dressing Dressing Change Frequency Wound #10 Right,Dorsal Foot o Change dressing every other day. Wound #11 Right,Plantar Metatarsal head fifth o Change dressing every other day. Wound #5 Right Calcaneus o Change dressing every other day. Wound #6 Left Calcaneus o Change dressing every other day. Wound #7 Left,Lateral Malleolus o Change dressing every other day. Wound #8 Left,Posterior Lower Leg o Change dressing every other day. Follow-up Appointments Wound #10 Right,Dorsal Foot o Return Appointment in 1 week. Wound #11 Right,Plantar Metatarsal head fifth o Return Appointment in 1 week. Wound #5 Right Calcaneus Carmen, Tezra L. ([id number]) o Return Appointment in 1 week. Wound #6 Left Calcaneus o Return Appointment in 1 week. Wound #7 Left,Lateral Malleolus o Return Appointment in 1 week. Wound #8 Left,Posterior Lower Leg o Return Appointment in 1 week. Edema Control Wound #10 Right,Dorsal Foot o Elevate legs to the level of the heart and pump ankles as often as possible Wound #11 Right,Plantar Metatarsal head fifth o Elevate legs to the level of the  heart and pump ankles as often as possible Wound #5 Right Calcaneus o Elevate legs to the level of the heart and pump ankles as often as possible Wound #6 Left Calcaneus   o Elevate legs to the level of the heart and pump ankles as often as possible Wound #7 Left,Lateral Malleolus o Elevate legs to the level of the heart and pump ankles as often as possible Wound #8 Left,Posterior Lower Leg o Elevate legs to the level of the heart and pump ankles as often as possible Off-Loading Wound #10 Right,Dorsal Foot o Turn and reposition every 2 hours o Other: - wear sage boots Wound #11 Right,Plantar Metatarsal head fifth o Turn and reposition every 2 hours o Other: - wear sage boots Wound #5 Right Calcaneus o Turn and reposition every 2 hours o Other: - wear sage boots Wound #6 Left Calcaneus o Turn and reposition every 2 hours o Other: - wear sage boots Wound #7 Left,Lateral Malleolus o Turn and reposition every 2 hours Aguinaldo, [name] ([id number]) o Other: - wear sage boots Wound #8 Left,Posterior Lower Leg o Turn and reposition every 2 hours o Other: - wear sage boots Additional Orders / Instructions Wound #10 Right,Dorsal Foot o Increase protein intake. o Other: - left 5th plantar metatarsal head paint with betadine every day (deep tissue injury) place foam to the top of the left foot for padded protection Wound #11 Right,Plantar Metatarsal head fifth o Increase protein intake. o Other: - left 5th plantar metatarsal head paint with betadine every day (deep tissue injury) place foam to the top of the left foot for padded protection Wound #5 Right Calcaneus o Increase protein intake. o Other: - left 5th plantar metatarsal head paint with betadine every day (deep tissue injury) place foam to the top of the left foot for padded protection Wound #6 Left Calcaneus o Increase protein intake. o Other: - left 5th plantar metatarsal  head paint with betadine every day (deep tissue injury) place foam to the top of the left foot for padded protection Wound #7 Left,Lateral Malleolus o Increase protein intake. o Other: - left 5th plantar metatarsal head paint with betadine every day (deep tissue injury) place foam to the top of the left foot for padded protection Wound #8 Left,Posterior Lower Leg o Increase protein intake. o Other: - left 5th plantar metatarsal head paint with betadine every day (deep tissue injury) place foam to the top of the left foot for padded protection Home Health Wound #10 Right,Dorsal Foot o [facility] o Home Health Nurse may visit PRN to address patientos wound care needs. o FACE TO FACE ENCOUNTER: MEDICARE and MEDICAID PATIENTS: I certify that this patient is under my care and that I had a face-to-face encounter that meets the physician face-to-face encounter requirements with this patient on this date. The encounter with the patient was in whole or in part for the following MEDICAL CONDITION: (primary reason for Home Healthcare) MEDICAL NECESSITY: I certify, that based on my findings, NURSING services are a medically necessary home health service. HOME BOUND STATUS: I certify that my clinical findings support that this patient is homebound (i.e., Due to illness or injury, pt requires aid of supportive devices such as crutches, cane, wheelchairs, walkers, the use of special Neira, Zlata L. ([id number]) transportation or the assistance of another person to leave their place of residence. There is a normal inability to leave the home and doing so requires considerable and taxing effort. Other absences are for medical reasons / religious services and are infrequent or of short duration when for other reasons). o If current dressing causes regression in wound condition, may D/C ordered dressing  product/s and apply Normal Saline Moist Dressing  daily until next Wound Healing Center / Other MD appointment. Notify Wound Healing Center of regression in wound condition at [phone]. o Please direct any NON-WOUND related issues/requests for orders to patient's Primary Care Physician Wound #11 Right,Plantar Metatarsal head fifth o [facility] o Home Health Nurse may visit PRN to address patientos wound care needs. o FACE TO FACE ENCOUNTER: MEDICARE and MEDICAID PATIENTS: I certify that this patient is under my care and that I had a face-to-face encounter that meets the physician face-to-face encounter requirements with this patient on this date. The encounter with the patient was in whole or in part for the following MEDICAL CONDITION: (primary reason for Home Healthcare) MEDICAL NECESSITY: I certify, that based on my findings, NURSING services are a medically necessary home health service. HOME BOUND STATUS: I certify that my clinical findings support that this patient is homebound (i.e., Due to illness or injury, pt requires aid of supportive devices such as crutches, cane, wheelchairs, walkers, the use of special transportation or the assistance of another person to leave their place of residence. There is a normal inability to leave the home and doing so requires considerable and taxing effort. Other absences are for medical reasons / religious services and are infrequent or of short duration when for other reasons). o If current dressing causes regression in wound condition, may D/C ordered dressing product/s and apply Normal Saline Moist Dressing daily until next Wound Healing Center / Other MD appointment. Notify Wound Healing Center of regression in wound condition at [phone]. o Please direct any NON-WOUND related issues/requests for orders to patient's Primary Care Physician Wound #5 Right Calcaneus o [facility] o Home Health Nurse may  visit PRN to address patientos wound care needs. o FACE TO FACE ENCOUNTER: MEDICARE and MEDICAID PATIENTS: I certify that this patient is under my care and that I had a face-to-face encounter that meets the physician face-to-face encounter requirements with this patient on this date. The encounter with the patient was in whole or in part for the following MEDICAL CONDITION: (primary reason for Home Healthcare) MEDICAL NECESSITY: I certify, that based on my findings, NURSING services are a medically necessary home health service. HOME BOUND STATUS: I certify that my clinical findings support that this patient is homebound (i.e., Due to illness or injury, pt requires aid of supportive devices such as crutches, cane, wheelchairs, walkers, the use of special transportation or the assistance of another person to leave their place of residence. There is a normal inability to leave the home and doing so requires considerable and taxing effort. Other absences are for medical reasons / religious services and are infrequent or of short duration when for other reasons). o If current dressing causes regression in wound condition, may D/C ordered dressing product/s and apply Normal Saline Moist Dressing daily until next Wound Healing Center / Other MD appointment. Notify Wound Healing Center of regression in wound condition at [phone]. CORDA, [name] ([id number]) o Please direct any NON-WOUND related issues/requests for orders to patient's Primary Care Physician Wound #6 Left Calcaneus o [facility] o Home Health Nurse may visit PRN to address patientos wound care needs. o FACE TO FACE ENCOUNTER: MEDICARE and MEDICAID PATIENTS: I certify that this patient is under my care and that I had a face-to-face encounter that meets the physician face-to-face encounter requirements with this  patient on this date. The encounter with the patient was in whole or  in part for the following MEDICAL CONDITION: (primary reason for Home Healthcare) MEDICAL NECESSITY: I certify, that based on my findings, NURSING services are a medically necessary home health service. HOME BOUND STATUS: I certify that my clinical findings support that this patient is homebound (i.e., Due to illness or injury, pt requires aid of supportive devices such as crutches, cane, wheelchairs, walkers, the use of special transportation or the assistance of another person to leave their place of residence. There is a normal inability to leave the home and doing so requires considerable and taxing effort. Other absences are for medical reasons / religious services and are infrequent or of short duration when for other reasons). o If current dressing causes regression in wound condition, may D/C ordered dressing product/s and apply Normal Saline Moist Dressing daily until next Wound Healing Center / Other MD appointment. Notify Wound Healing Center of regression in wound condition at [phone]. o Please direct any NON-WOUND related issues/requests for orders to patient's Primary Care Physician Wound #7 Left,Lateral Malleolus o [facility] - [facility] o Home Health Nurse may visit PRN to address patientos wound care needs. o FACE TO FACE ENCOUNTER: MEDICARE and MEDICAID PATIENTS: I certify that this patient is under my care and that I had a face-to-face encounter that meets the physician face-to-face encounter requirements with this patient on this date. The encounter with the patient was in whole or in part for the following MEDICAL CONDITION: (primary reason for Home Healthcare) MEDICAL NECESSITY: I certify, that based on my findings, NURSING services are a medically necessary home health service. HOME BOUND STATUS: I certify that my clinical findings support that this patient is homebound (i.e., Due to illness or injury, pt requires aid of supportive  devices such as crutches, cane, wheelchairs, walkers, the use of special transportation or the assistance of another person to leave their place of residence. There is a normal inability to leave the home and doing so requires considerable and taxing effort. Other absences are for medical reasons / religious services and are infrequent or of short duration when for other reasons). o If current dressing causes regression in wound condition, may D/C ordered dressing product/s and apply Normal Saline Moist Dressing daily until next Wound Healing Center / Other MD appointment. Notify Wound Healing Center of regression in wound condition at [phone]. o Please direct any NON-WOUND related issues/requests for orders to patient's Primary Care Physician Wound #8 Left,Posterior Lower Leg o [facility] o Home Health Nurse may visit PRN to address patientos wound care needs. o FACE TO FACE ENCOUNTER: MEDICARE and MEDICAID PATIENTS: I certify that this patient is under my care and that I had a face-to-face encounter that meets the physician face-to-face LOVEDA, COLAIZZI ([id number]) encounter requirements with this patient on this date. The encounter with the patient was in whole or in part for the following MEDICAL CONDITION: (primary reason for Home Healthcare) MEDICAL NECESSITY: I certify, that based on my findings, NURSING services are a medically necessary home health service. HOME BOUND STATUS: I certify that my clinical findings support that this patient is homebound (i.e., Due to illness or injury, pt requires aid of supportive devices such as crutches, cane, wheelchairs, walkers, the use of special transportation or the assistance of another person to leave their place of residence. There is a normal inability to leave the home and  doing so requires considerable and taxing effort. Other absences are for medical reasons / religious services and  are infrequent or of short duration when for other reasons). o If current dressing causes regression in wound condition, may D/C ordered dressing product/s and apply Normal Saline Moist Dressing daily until next Wound Healing Center / Other MD appointment. Notify Wound Healing Center of regression in wound condition at [phone]. o Please direct any NON-WOUND related issues/requests for orders to patient's Primary Care Physician Radiology o X-ray, foot - Left foot and right foot o X-ray, lower leg - left lower leg Electronic Signature(s) Signed: 02/03/2017 4:47:30 PM By: [name] Najjar MD Signed: 02/03/2017 5:03:57 PM By: [name] Entered By: [name] on 02/03/2017 09:00:17 Pettibone, [name] ([id number]) -------------------------------------------------------------------------------- Problem List Details [name] Date of Service: 02/03/2017 8:00 AM Patient Name: [name] Account Number: [id number] Medical Record Treating RN: [name] [id number] Number: Other Clinician: Jul 29, 1920 (83 y.o. 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 Account Number: [id number] Medical Record Treating RN: [name] [id number] Number: Other Clinician: 1920-10-24 (83 y.o. Treating Treg Diemer Date of Birth/Sex: Female) Provider/Extender: G Primary Care Provider: Antony [name] Referring Provider: Wallene [name] in Treatment: 8 Subjective Chief Complaint Information obtained from Patient Follow-up regarding right lateral calf wounds and right heel pressure injury History of Present Illness (HPI) 03/24/16; this is an elderly 83 year old woman with advanced dementia who was hospitalized from 5/8 through 5/13. At that point she had Proteus sepsis felt to be secondary to a UTI the. Acute kidney injury and delirium. Her son who is her primary caregiver says she came home from the hospital with wounds on her right lower extremity and apparently her right buttock as well. There is no mention on the hospital discharge summary of problems. She has been having [facility] and have been using silver alginate based dressings. Apparently the area on the right buttock is healing and the son stated we did not need to become involved with that. In any case the patient has for wounds to on the right heel and 2 on the posterior lateral right calf, the latter of which is not a usual pressure area however that is the history. She is apparently eating and drinking fairly well. Takes ensure well. She does not have any pressure-relief surface at home. I have reviewed her lab work from the hospital. Her admission albumin was 3.4 on 5/8 04/01/16; patient arrives with wounds looking much the same as last week. She has an extensive pressure area over her right heel and to small but deep wounds on the lateral aspect of her right lower leg. These wounds are not connected. Although there are advertised his pressure ulcers these 2 wounds are not  usual for pressure ulcerations. We have not looked at the pressure ulceration on her  buttock at the request of the patient's son who is the primary caregiver 04/08/16; wounds are stable to improved this week. Culture I did of the lower wound on her right lateral leg grew methicillin sensitive staph aureus she is on Septra. We are using silver alginate to all the wounds. 04/15/16; the 2 small areas on the lateral aspect of this lady's right leg continued to improve however the heel is once again covered with callus, residual alginate, acrotic material all of which requires a difficult debridement. There continues to be purulent material under this surface. This is in spite of a week of Septra that I gave him for MSSA 04/22/16 Patient today presents for follow-up evaluation. She is seen with her son in the office at this point in time she does have Alzheimer's. The good news is her wounds appeared to be somewhat smaller with current therapy. At this point in time the infection which was cultured previously appears to have improved in regard to the right heel wound. There is no purulent drainage at this point in time and a good portion of the wound actually is eschar covered and dry with a medial portion actually open to granulation with very little slough covering. She really has no significant discomfort with palpation manipulation of the wound at this point in time. MELANEE, CORDIAL ([id number]) 04/28/16 patient presents today for follow-up evaluation concerning her right lateral calf wound as well as right heel wound. she has not really been complaining this for as pain is concerned according to her son seen with her in the office at this point in time today. She seems to show no interval signs or symptoms of infection overall has been tolerating the dressing changes well. She does have Alzheimer's therefore is not able to rate her pain although she does respond with an affirmative that she  hurts when I press over the heel region. 05/05/16; the area on the lateral aspect of her right calf is healed. The right heel as 2 small spots that are still open and very close to resolving as well using Aquacel Ag under border foam, 05/20/16; the area on the lateral aspect of her right calf remains closed. The 2 small areas on her right heel are also closed. READMISSION 12/09/16; this is a 83 year old woman with advanced dementia that we cared for in the clinic from September to the beginning of November 2017. At that point she had a right heel, right lateral calf and right buttock wound all felt to be secondary to pressure. These eventually closed over. The patient's son who is the primary caregiver states that over the last 2 months she is developed bilateral lower extremity pressure ulcers. [facility] and they have been applying Medihoney and an alginate. These were apparently all pressure ulcers today including area on the left posterior calf. Apparently they were keeping her leg elevated on the pillow which contributed to this. The patient has advanced dementia, is nonambulatory and not able to move herself in bed. There is apparently not a nutritional issue. [facility] has [facility]. 12/16/16; patient was readmitted to the clinic last week with multiple wounds including the medial and lateral aspects of the left calcaneus left posterior calf right calcaneus. We've been using Iodoflex, dressing change by home health. Her son also reports that she has had trouble breathing which she attributes to the low air loss mattress we have for pressure relief. He states that when he took her out  of bed and put her in her chair breathing improved. As of Monday he took the low air loss mattress off the bed and states that she is able to go to bed without shortness of breath. His description sounded a lot like orthopnea. He is asking [place] to order a gel mattress overlay.  According to her son she does not aspirate at least not frequently 12/23/16; the patient is reviewed today for multiple wounds on her lower extremities. We have been using Iodoflex dressing change by home health. Last week the patient was Cheyne-Stokes in with an x-ray that look like heart failure with small bilateral pleural effusions. I gave her Lasix and some potassium. She saw her primary physician earlier this week who did not add anything to this. Her son states she is doing better she has not had any of the orthopneic episodes since Monday 01/06/17; 3 week followup. using iodoflex. She has a large wound on the left lateral calf with exposed tendon, and necrotic wound over the left medial heel. Wound over the left lateral malleolus appears to be improved. On the right side necrotic wound on the right medial heel, wound on the right lateral heel looks somewhat better and the area on the left anterior lateral calf looks as though it's progressing towards closure. The patient may have arterial issues however she is too far along in terms of her dementia to undergo testing. Her son claims she is eating well and there offloading this. 01/27/17 on evaluation today patient appears to be doing somewhat worse with new wounds over the right fifth metatarsal region laterally as well as the dorsal right foot. Both of these appear to be deep tissue Rosello, Darina L. ([id number]) injuries which have now opened over the two weeks since we last saw her. She also has a mildly tissue injury in the fifth metatarsal region on the left although this does not appear to be worsening and in fact I think wound up being okay. Nonetheless she does have skin in which is loose of the right heel as well as the left posterior calf region. She wears the [employer] when she is in bed but has not been wearing them when she is in her wheelchair. 02/03/17; culture that was done last week of the right dorsal foot grew both  methicillin sensitive staph aureus and Pseudomonas. She was initially thought to have Cipro however that up apparently increases QT along with Aricept was not aware that Aricept did this. This was changed to cefdinir and Septra however the patient is apparently allergic to Septra even though it is not listed in our records. The patient started the cefdinir yesterday. In spite of the delay of antibiotics the area on the right dorsal foot actually looks a lot better than last week with a thick covering eschar and necrotic tissue but no surrounding erythema or drainage. I also took the opportunity today to review her vascular status. When she was in our clinic the first time her ABI on the left was 0.9 and on the right noncompressible. It is still noncompressible today. She has not had imaging studies she has the following wounds including the right dorsal ankle/foot, right heel and right fifth metatarsal head. On the left side she has the medial left heel the left lateral malleolus and the left posterior lateral calf. We have been using Iodoflex on the right and Prisma on the left Allergies Sulfa (Sulfonamide Antibiotics) (Reaction: rash) Objective Constitutional Patient is hypertensive.. Pulse regular and  within target range for patient.Marland Kitchen Respirations regular, non-labored and within target range.. Temperature is normal and within the target range for the patient.. Very frail- looking woman. Vitals Time Taken: 8:14 AM, Height: 69 in, Weight: 105 lbs, BMI: 15.5, Pulse: 87 bpm, Respiratory Rate: 16 breaths/min, Blood Pressure: 165/72 mmHg. Eyes Conjunctivae clear. No discharge. Respiratory Respiratory effort is easy and symmetric bilaterally. Rate is normal at rest and on room air.Marland Kitchen Duchesneau, [name] ([id number]) Cardiovascular Femoral pulses and popliteal pulses are palpable bilaterally.. Her dorsalis pedis pulses are definitely palpable bilaterally as well as the left posterior  tibial.. Lymphatic None palpable in the popliteal or inguinal area. Psychiatric Advanced dementia. General Notes: Wound exam; really a very difficult situation however all her wounds are carefully identified today #1 on the right side she has a large necrotic eschar over her entire right heel. Thick necrotic surface over her right ankle/foot but no surrounding erythematous week and a necrotic wound with tunneling on the right fifth metatarsal head. No surrounding erythema in either wound. #2 on the left side she has an open wound on the medial left heel about 50% surface slough, smaller wound over the left lateral malleolus and an area with exposed tendon on the left posterior lateral calf. Again no surrounding erythema in any one of these areas Integumentary (Hair, Skin) Wound #10 status is Open. Original cause of wound was Pressure Injury. The wound is located on the Right,Dorsal Foot. The wound measures 3cm length x 1.5cm width x 0.1cm depth; 3.534cm^2 area and 0.353cm^3 volume. There is no tunneling or undermining noted. There is a large amount of serosanguineous drainage noted. The wound margin is distinct with the outline attached to the wound base. There is no granulation within the wound bed. There is a large (67-100%) amount of necrotic tissue within the wound bed including Eschar. The periwound skin appearance exhibited: Hemosiderin Staining, Rubor, Erythema. The surrounding wound skin color is noted with erythema which is circumferential. Periwound temperature was noted as No Abnormality. The periwound has tenderness on palpation. Wound #11 status is Open. Original cause of wound was Pressure Injury. The wound is located on the Right,Plantar Metatarsal head fifth. The wound measures 1.5cm length x 0.6cm width x 0.1cm depth; 0.707cm^2 area and 0.071cm^3 volume. There is no tunneling or undermining noted. There is a large amount of serosanguineous drainage noted. The wound margin is  distinct with the outline attached to the wound base. There is no granulation within the wound bed. There is a large (67-100%) amount of necrotic tissue within the wound bed including Eschar. The periwound skin appearance exhibited: Rubor, Erythema. The surrounding wound skin color is noted with erythema which is circumferential. The periwound has tenderness on palpation. Wound #5 status is Open. Original cause of wound was Pressure Injury. The wound is located on the Right Calcaneus. The wound measures 5.8cm length x 6.5cm width x 0.2cm depth; 29.61cm^2 area and 5.922cm^3 volume. There is no tunneling or undermining noted. There is a large amount of purulent drainage noted. The wound margin is distinct with the outline attached to the wound base. There is small (1-33%) granulation within the wound bed. There is a large (67-100%) amount of necrotic tissue within the wound bed including Eschar and Adherent Slough. The periwound skin appearance exhibited: Erythema. The periwound skin appearance did not exhibit: Ecchymosis. The surrounding wound skin color is noted with erythema which is circumferential. Periwound temperature was noted as No Abnormality. The periwound has tenderness on palpation. Wound #6 status  is Open. Original cause of wound was Pressure Injury. The wound is located on the Left Calcaneus. The wound measures 2.5cm length x 2cm width x 0.1cm depth; 3.927cm^2 area and Hast, Jaymi L. ([id number]) 0.393cm^3 volume. There is no tunneling or undermining noted. There is a medium amount of purulent drainage noted. The wound margin is distinct with the outline attached to the wound base. There is medium (34-66%) red granulation within the wound bed. There is a medium (34-66%) amount of necrotic tissue within the wound bed including Eschar and Adherent Slough. The periwound skin appearance exhibited: Erythema. The surrounding wound skin color is noted with erythema which is  circumferential. Periwound temperature was noted as No Abnormality. The periwound has tenderness on palpation. Wound #7 status is Open. Original cause of wound was Pressure Injury. The wound is located on the Left,Lateral Malleolus. The wound measures 1cm length x 0.8cm width x 0.2cm depth; 0.628cm^2 area and 0.126cm^3 volume. There is no tunneling or undermining noted. There is a large amount of serosanguineous drainage noted. The wound margin is distinct with the outline attached to the wound base. There is large (67-100%) red granulation within the wound bed. There is a small (1-33%) amount of necrotic tissue within the wound bed including Adherent Slough. The periwound skin appearance exhibited: Erythema. The surrounding wound skin color is noted with erythema which is circumferential. Periwound temperature was noted as No Abnormality. The periwound has tenderness on palpation. Wound #8 status is Open. Original cause of wound was Pressure Injury. The wound is located on the Left,Posterior Lower Leg. The wound measures 7.9cm length x 1.7cm width x 0.7cm depth; 10.548cm^2 area and 7.384cm^3 volume. There is tendon and Fat Layer (Subcutaneous Tissue) Exposed exposed. There is no tunneling or undermining noted. There is a large amount of purulent drainage noted. The wound margin is distinct with the outline attached to the wound base. There is medium (34-66%) red granulation within the wound bed. There is a medium (34-66%) amount of necrotic tissue within the wound bed including Eschar and Adherent Slough. The periwound skin appearance exhibited: Erythema. The surrounding wound skin color is noted with erythema which is circumferential. Periwound temperature was noted as No Abnormality. The periwound has tenderness on palpation. Assessment Active Problems ICD-10 L89.623 - Pressure ulcer of left heel, stage 3 L89.613 - Pressure ulcer of right heel, stage 3 L89.523 - Pressure ulcer of left  ankle, stage 3 L97.223 - Non-pressure chronic ulcer of left calf with necrosis of muscle G30.1 - Alzheimer's disease with late onset Procedures Wound #10 Pre-procedure diagnosis of Wound #10 is a Pressure Ulcer located on the Right,Dorsal Foot . There was a Skin/Subcutaneous Tissue Debridement ([id number]) debridement with total area of 4.5 sq cm performed Westrich, Alayla L. ([id number]) by [name] Caul, MD. with the following instrument(s): Blade and Forceps to remove Viable and Non-Viable tissue/material including Exudate, Fibrin/Slough, and Subcutaneous after achieving pain control using Lidocaine 4% Topical Solution. A time out was conducted at 08:53, prior to the start of the procedure. A Minimum amount of bleeding was controlled with Pressure. The procedure was tolerated well with a pain level of 0 throughout and a pain level of 0 following the procedure. Post Debridement Measurements: 3cm length x 1.5cm width x 0.1cm depth; 0.353cm^3 volume. Post debridement Stage noted as Category/Stage II. Character of Wound/Ulcer Post Debridement requires further debridement. Post procedure Diagnosis Wound #10: Same as Pre-Procedure Wound #11 Pre-procedure diagnosis of Wound #11 is a Pressure Ulcer located on the Right,Plantar Metatarsal  head fifth . There was a Skin/Subcutaneous Tissue Debridement ([id number]) debridement with total area of 0.9 sq cm performed by [name] Caul, MD. with the following instrument(s): Blade and Forceps to remove Viable and Non-Viable tissue/material including Exudate, Fibrin/Slough, and Subcutaneous after achieving pain control using Lidocaine 4% Topical Solution. A time out was conducted at 08:53, prior to the start of the procedure. A Minimum amount of bleeding was controlled with Pressure. The procedure was tolerated well with a pain level of 0 throughout and a pain level of 0 following the procedure. Post Debridement Measurements: 1.5cm length x  0.6cm width x 0.2cm depth; 0.141cm^3 volume. Post debridement Stage noted as Category/Stage II. Character of Wound/Ulcer Post Debridement requires further debridement. Post procedure Diagnosis Wound #11: Same as Pre-Procedure Using a scalpel and pickups I removed the necrotic surface from the right dorsal ankle/foot. Hemostasis with direct pressure Using a scalpel #15 and pickups I remove necrotic tissue skin subcutaneous tissue from around the wound over the plantar right fifth metatarsal head Hemostasis with direct pressure Plan Wound Cleansing: Wound #10 Right,Dorsal Foot: Clean wound with Normal Saline. Cleanse wound with mild soap and water Wound #11 Right,Plantar Metatarsal head fifth: Clean wound with Normal Saline. Cleanse wound with mild soap and water Wound #5 Right Calcaneus: Clean wound with Normal Saline. Cleanse wound with mild soap and water Pflug, Rylee L. ([id number]) Wound #6 Left Calcaneus: Clean wound with Normal Saline. Cleanse wound with mild soap and water Wound #7 Left,Lateral Malleolus: Clean wound with Normal Saline. Cleanse wound with mild soap and water Wound #8 Left,Posterior Lower Leg: Clean wound with Normal Saline. Cleanse wound with mild soap and water Anesthetic: Wound #10 Right,Dorsal Foot: Topical Lidocaine 4% cream applied to wound bed prior to debridement - for clinic use Wound #11 Right,Plantar Metatarsal head fifth: Topical Lidocaine 4% cream applied to wound bed prior to debridement - for clinic use Wound #5 Right Calcaneus: Topical Lidocaine 4% cream applied to wound bed prior to debridement - for clinic use Wound #6 Left Calcaneus: Topical Lidocaine 4% cream applied to wound bed prior to debridement - for clinic use Wound #7 Left,Lateral Malleolus: Topical Lidocaine 4% cream applied to wound bed prior to debridement - for clinic use Wound #8 Left,Posterior Lower Leg: Topical Lidocaine 4% cream applied to wound bed prior to  debridement - for clinic use Skin Barriers/Peri-Wound Care: Wound #10 Right,Dorsal Foot: Skin Prep Wound #11 Right,Plantar Metatarsal head fifth: Skin Prep Wound #7 Left,Lateral Malleolus: Skin Prep Wound #8 Left,Posterior Lower Leg: Skin Prep Primary Wound Dressing: Wound #10 Right,Dorsal Foot: Iodoflex Wound #11 Right,Plantar Metatarsal head fifth: Iodoflex Wound #5 Right Calcaneus: Iodoflex Wound #6 Left Calcaneus: Iodoflex Wound #7 Left,Lateral Malleolus: Prisma Ag - moisten with saline or hydrogel Wound #8 Left,Posterior Lower Leg: Iodoflex Secondary Dressing: Wound #10 Right,Dorsal Foot: Dry Gauze Boardered Foam Dressing Wound #11 Right,Plantar Metatarsal head fifth: Dry Gauze Boardered Foam Dressing YOULANDA, TOMASSETTI. ([id number]) Wound #5 Right Calcaneus: Dry Gauze Conform/Kerlix Other - allevyn heel cup, stretch netting #4 Wound #6 Left Calcaneus: Dry Gauze Conform/Kerlix Other - allevyn heel cup, stretch netting #4 Wound #7 Left,Lateral Malleolus: Dry Gauze Boardered Foam Dressing Wound #8 Left,Posterior Lower Leg: Dry Gauze Boardered Foam Dressing Dressing Change Frequency: Wound #10 Right,Dorsal Foot: Change dressing every other day. Wound #11 Right,Plantar Metatarsal head fifth: Change dressing every other day. Wound #5 Right Calcaneus: Change dressing every other day. Wound #6 Left Calcaneus: Change dressing every other day. Wound #7 Left,Lateral Malleolus: Change dressing every  other day. Wound #8 Left,Posterior Lower Leg: Change dressing every other day. Follow-up Appointments: Wound #10 Right,Dorsal Foot: Return Appointment in 1 week. Wound #11 Right,Plantar Metatarsal head fifth: Return Appointment in 1 week. Wound #5 Right Calcaneus: Return Appointment in 1 week. Wound #6 Left Calcaneus: Return Appointment in 1 week. Wound #7 Left,Lateral Malleolus: Return Appointment in 1 week. Wound #8 Left,Posterior Lower Leg: Return  Appointment in 1 week. Edema Control: Wound #10 Right,Dorsal Foot: Elevate legs to the level of the heart and pump ankles as often as possible Wound #11 Right,Plantar Metatarsal head fifth: Elevate legs to the level of the heart and pump ankles as often as possible Wound #5 Right Calcaneus: Elevate legs to the level of the heart and pump ankles as often as possible Wound #6 Left Calcaneus: Elevate legs to the level of the heart and pump ankles as often as possible Wound #7 Left,Lateral Malleolus: Elevate legs to the level of the heart and pump ankles as often as possible Zenk, Vikkie L. ([id number]) Wound #8 Left,Posterior Lower Leg: Elevate legs to the level of the heart and pump ankles as often as possible Off-Loading: Wound #10 Right,Dorsal Foot: Turn and reposition every 2 hours Other: - wear sage boots Wound #11 Right,Plantar Metatarsal head fifth: Turn and reposition every 2 hours Other: - wear sage boots Wound #5 Right Calcaneus: Turn and reposition every 2 hours Other: - wear sage boots Wound #6 Left Calcaneus: Turn and reposition every 2 hours Other: - wear sage boots Wound #7 Left,Lateral Malleolus: Turn and reposition every 2 hours Other: - wear sage boots Wound #8 Left,Posterior Lower Leg: Turn and reposition every 2 hours Other: - wear sage boots Additional Orders / Instructions: Wound #10 Right,Dorsal Foot: Increase protein intake. Other: - left 5th plantar metatarsal head paint with betadine every day (deep tissue injury) place foam to the top of the left foot for padded protection Wound #11 Right,Plantar Metatarsal head fifth: Increase protein intake. Other: - left 5th plantar metatarsal head paint with betadine every day (deep tissue injury) place foam to the top of the left foot for padded protection Wound #5 Right Calcaneus: Increase protein intake. Other: - left 5th plantar metatarsal head paint with betadine every day (deep tissue injury) place  foam to the top of the left foot for padded protection Wound #6 Left Calcaneus: Increase protein intake. Other: - left 5th plantar metatarsal head paint with betadine every day (deep tissue injury) place foam to the top of the left foot for padded protection Wound #7 Left,Lateral Malleolus: Increase protein intake. Other: - left 5th plantar metatarsal head paint with betadine every day (deep tissue injury) place foam to the top of the left foot for padded protection Wound #8 Left,Posterior Lower Leg: Increase protein intake. Other: - left 5th plantar metatarsal head paint with betadine every day (deep tissue injury) place foam to the top of the left foot for padded protection Home Health: Wound #10 Right,Dorsal Foot: Continue [facility] Visits - [facility] Nurse may visit PRN to address patient s wound care needs. FACE TO FACE ENCOUNTER: MEDICARE and MEDICAID PATIENTS: I certify that this patient is under [name], [name]. ([id number]) my care and that I had a face-to-face encounter that meets the physician face-to-face encounter requirements with this patient on this date. The encounter with the patient was in whole or in part for the following MEDICAL CONDITION: (primary reason for Home Healthcare) MEDICAL NECESSITY: I certify, that based on my findings, NURSING  services are a medically necessary home health service. HOME BOUND STATUS: I certify that my clinical findings support that this patient is homebound (i.e., Due to illness or injury, pt requires aid of supportive devices such as crutches, cane, wheelchairs, walkers, the use of special transportation or the assistance of another person to leave their place of residence. There is a normal inability to leave the home and doing so requires considerable and taxing effort. Other absences are for medical reasons / religious services and are infrequent or of short duration when for other reasons). If current  dressing causes regression in wound condition, may D/C ordered dressing product/s and apply Normal Saline Moist Dressing daily until next Wound Healing Center / Other MD appointment. Notify Wound Healing Center of regression in wound condition at [phone]. Please direct any NON-WOUND related issues/requests for orders to patient's Primary Care Physician Wound #11 Right,Plantar Metatarsal head fifth: Continue [facility] Vis[facility] - [facility] Nurse may visit PRN to address patient s wound care needs. FACE TO FACE ENCOUNTER: MEDICARE and MEDICAID PATIENTS: I certify that this patient is under my care and that I had a face-to-face encounter that meets the physician face-to-face encounter requirements with this patient on this date. The encounter with the patient was in whole or in part for the following MEDICAL CONDITION: (primary reason for Home Healthcare) MEDICAL NECESSITY: I certify, that based on my findings, NURSING services are a medically necessary home health service. HOME BOUND STATUS: I certify that my clinical findings support that this patient is homebound (i.e., Due to illness or injury, pt requires aid of supportive devices such as crutches, cane, wheelchairs, walkers, the use of special transportation or the assistance of another person to leave their place of residence. There is a normal inability to leave the home and doing so requires considerable and taxing effort. Other absences are for medical reasons / religious services and are infrequent or of short duration when for other reasons). If current dressing causes regression in wound condition, may D/C ordered dressing product/s and apply Normal Saline Moist Dressing daily until next Wound Healing Center / Other MD appointment. Notify Wound Healing Center of regression in wound condition at [phone]. Please direct any NON-WOUND related issues/requests for orders to patient's Primary Care Physician Wound #5  Right Calcaneus: Continue Home Health Visits - [facility] Nurse may visit PRN to address patient s wound care needs. FACE TO FACE ENCOUNTER: MEDICARE and MEDICAID PATIENTS: I certify that this patient is under my care and that I had a face-to-face encounter that meets the physician face-to-face encounter requirements with this patient on this date. The encounter with the patient was in whole or in part for the following MEDICAL CONDITION: (primary reason for Home Healthcare) MEDICAL NECESSITY: I certify, that based on my findings, NURSING services are a medically necessary home health service. HOME BOUND STATUS: I certify that my clinical findings support that this patient is homebound (i.e., Due to illness or injury, pt requires aid of supportive devices such as crutches, cane, wheelchairs, walkers, the use of special transportation or the assistance of another person to leave their place of residence. There is a normal inability to leave the home and doing so requires considerable and taxing effort. Other absences are for medical reasons / religious services and are infrequent or of short duration when for other reasons). If current dressing causes regression in wound condition, may D/C ordered dressing product/s and apply Normal Saline Moist Dressing  daily until next Wound Healing Center / Other MD appointment. Notify Wound Healing Center of regression in wound condition at [phone]. Please direct any NON-WOUND related issues/requests for orders to patient's Primary Care Physician Wound #6 Left Calcaneus: Continue Home [facility] Visits - [facility] Nurse may visit PRN to address patient s wound care needs. FACE TO FACE ENCOUNTER: MEDICARE and MEDICAID PATIENTS: I certify that this patient is under [name], [name]. ([id number]) my care and that I had a face-to-face encounter that meets the physician face-to-face encounter requirements with this patient  on this date. The encounter with the patient was in whole or in part for the following MEDICAL CONDITION: (primary reason for Home Healthcare) MEDICAL NECESSITY: I certify, that based on my findings, NURSING services are a medically necessary home health service. HOME BOUND STATUS: I certify that my clinical findings support that this patient is homebound (i.e., Due to illness or injury, pt requires aid of supportive devices such as crutches, cane, wheelchairs, walkers, the use of special transportation or the assistance of another person to leave their place of residence. There is a normal inability to leave the home and doing so requires considerable and taxing effort. Other absences are for medical reasons / religious services and are infrequent or of short duration when for other reasons). If current dressing causes regression in wound condition, may D/C ordered dressing product/s and apply Normal Saline Moist Dressing daily until next Wound Healing Center / Other MD appointment. Notify Wound Healing Center of regression in wound condition at [phone]. Please direct any NON-WOUND related issues/requests for orders to patient's Primary Care Physician Wound #7 Left,Lateral Malleolus: Continue [facility] Visits - [facility] Nurse may visit PRN to address patient s wound care needs. FACE TO FACE ENCOUNTER: MEDICARE and MEDICAID PATIENTS: I certify that this patient is under my care and that I had a face-to-face encounter that meets the physician face-to-face encounter requirements with this patient on this date. The encounter with the patient was in whole or in part for the following MEDICAL CONDITION: (primary reason for Home Healthcare) MEDICAL NECESSITY: I certify, that based on my findings, NURSING services are a medically necessary home health service. HOME BOUND STATUS: I certify that my clinical findings support that this patient is homebound (i.e., Due to illness or  injury, pt requires aid of supportive devices such as crutches, cane, wheelchairs, walkers, the use of special transportation or the assistance of another person to leave their place of residence. There is a normal inability to leave the home and doing so requires considerable and taxing effort. Other absences are for medical reasons / religious services and are infrequent or of short duration when for other reasons). If current dressing causes regression in wound condition, may D/C ordered dressing product/s and apply Normal Saline Moist Dressing daily until next Wound Healing Center / Other MD appointment. Notify Wound Healing Center of regression in wound condition at [phone]. Please direct any NON-WOUND related issues/requests for orders to patient's Primary Care Physician Wound #8 Left,Posterior Lower Leg: Continue [facility] Visits - [facility] Nurse may visit PRN to address patient s wound care needs. FACE TO FACE ENCOUNTER: MEDICARE and MEDICAID PATIENTS: I certify that this patient is under my care and that I had a face-to-face encounter that meets the physician face-to-face encounter requirements with this patient on this date. The encounter with the patient was in whole or in part for the following MEDICAL  CONDITION: (primary reason for Home Healthcare) MEDICAL NECESSITY: I certify, that based on my findings, NURSING services are a medically necessary home health service. HOME BOUND STATUS: I certify that my clinical findings support that this patient is homebound (i.e., Due to illness or injury, pt requires aid of supportive devices such as crutches, cane, wheelchairs, walkers, the use of special transportation or the assistance of another person to leave their place of residence. There is a normal inability to leave the home and doing so requires considerable and taxing effort. Other absences are for medical reasons / religious services and are infrequent or of  short duration when for other reasons). If current dressing causes regression in wound condition, may D/C ordered dressing product/s and apply Normal Saline Moist Dressing daily until next Wound Healing Center / Other MD appointment. Notify Wound Healing Center of regression in wound condition at [phone]. Please direct any NON-WOUND related issues/requests for orders to patient's Primary Care Physician Radiology ordered were: X-ray, foot - Left foot and right foot, X-ray, lower leg - left lower leg Slavens, Harmoney L. ([id number]) #1 I am going to continue with Iodoflex to the wounds on the right and Prisma to the wounds on the left #2 x-rays of both her feet question osteomyelitis especially the wounds on the heels right greater than left #3 I found myself wondering about her vascular supply. I could feel her dorsalis pedis pulses bilaterally although they are faint as well as the posterior tibial on the left but not the right. It would be difficult to see her undergo formal vascular testing however because of her frail status and the advanced nature of her dementia. #4 if her x-rays are negative for osteomyelitis. I may consider TCOMS #5 started defdinir yesterday. no overt infection today Electronic Signature(s) Signed: 02/03/2017 4:47:30 PM By: [name] Najjarobson, Brigido Mera MD Entered By: [name] Najjarobson, Chalmer Zheng on 02/03/2017 09:31:14 Cathey, [name] SeatsELIZABETH L. ([id number]) -------------------------------------------------------------------------------- SuperBill Details [name] BabeSUMMERS, Gricelda Date of Service: 02/03/2017 Patient Name: [name] Account Number: [id number] Medical Record Treating RN: [name] Haggisinkerton, Debi [id number] Number: Other Clinician: [birth year] (83 y.o. 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 onset L97.521 Non-pressure chronic ulcer of other part of left foot limited to breakdown of skin L97.321 Non-pressure chronic ulcer of left ankle limited to breakdown of skin Facility Procedures CPT4: Description Modifier Quantity Code [id number] 11042 - DEB SUBQ TISSUE 20 SQ CM/< 1 ICD-10 Description Diagnosis L97.521 Non-pressure chronic ulcer of other part of left foot limited to breakdown of skin L97.321 Non-pressure chronic ulcer of left  ankle limited to breakdown of skin Physician Procedures CPT4: Description Modifier Quantity Code [id number] 11042 - WC PHYS SUBQ TISS 20 SQ CM 1 ICD-10 Description Diagnosis L97.521 Non-pressure chronic ulcer of other part of left foot limited to breakdown of skin L97.321 Non-pressure chronic ulcer of left ankle  limited to breakdown of skin Electronic Signature(s) Coralee RudSUMMERS, Brean L. ([id number]) Signed: 02/03/2017 4:47:30 PM By: [name] Najjarobson, Larinda Herter MD Entered By: [name] Najjarobson, Nicholas Trompeter on 02/03/2017 09:33:29

## 2017-02-05 NOTE — Progress Notes (Signed)
BAYLER, NEHRING ([id number]) Visit Report for 02/03/2017 Allergy List Details MISHKA, STEGEMANN Date of Service: 02/03/2017 8:00 AM Patient Name: [name] Account Number: [id number] Medical Record Treating RN: [name] [id number] Number: Other Clinician: Date of Birth/Sex: 1921-02-15 (83 y.o. Female) Treating [name], [name] Primary Care Caitlin Hillmer: [name] Chaka Jefferys/Extender: G Referring Vasilisa Vore: [name] Weeks in Treatment: 8 Allergies Active Allergies Sulfa (Sulfonamide Antibiotics) Reaction: rash Allergy Notes Electronic Signature(s) Signed: 02/03/2017 5:03:57 PM By: [name] Entered By: [name] on 02/03/2017 08:24:40 [place], [name] ([id number]) -------------------------------------------------------------------------------- Arrival Information Details [name] Date of Service: 02/03/2017 8:00 AM Patient Name: [name] Account Number: [id number] Medical Record Treating RN: [name] [id number] Number: Other Clinician: Date of Birth/Sex: 24-Apr-1921 (83 y.o. Female) Treating [name], [name] Primary Care Rosilyn Coachman: [name] Nalaysia Manganiello/Extender: G Referring Kada Friesen: [name] in Treatment: 8 Visit Information Patient Arrived: Wheel Chair Arrival Time: 08:05 Accompanied By: son Transfer Assistance: EasyPivot Patient Lift Patient Identification Verified: Yes Secondary Verification Process Yes Completed: Patient Requires Transmission- No Based Precautions: Patient Has Alerts: Yes Patient Alerts: L ABI non- compressible R ABI non- compressible History Since Last Visit All ordered tests and consults were No completed: Added or deleted any medications: No Any new allergies or adverse reactions: No Had a fall or experienced change in No activities of daily living that may affect risk of falls: Signs or symptoms of abuse/neglect No since last visito Hospitalized since last visit: No Has  Dressing in Place as Prescribed: Yes Pain Present Now: Unable to Electronic Signature(s) Signed: 02/03/2017 5:03:57 PM By: [name] Entered By: [name] on 02/03/2017 08:05:30 Whichard, [name] ([id number]) -------------------------------------------------------------------------------- Encounter Discharge Information Details [name] Date of Service: 02/03/2017 8:00 AM Patient Name: [name] Account Number: [id number] Medical Record Treating RN: [name] [id number] Number: Other Clinician: Date of Birth/Sex: Sep 18, 1920 (83 y.o. Female) Treating [name], [name] Primary Care Rasheedah Reis: [name] Tyesha Joffe/Extender: G Referring Analis Distler: [name] in Treatment: 8 Encounter Discharge Information Items Discharge Pain Level: 0 Discharge Condition: Stable Ambulatory Status: Wheelchair Discharge Destination: Home Transportation: Private Auto Accompanied By: son Schedule Follow-up Appointment: Yes Medication Reconciliation completed and provided to Patient/Care No Kalisha Keadle: Provided on Clinical Summary of Care: 02/03/2017 Form Type Recipient Paper Patient ES Electronic Signature(s) Signed: 02/03/2017 9:29:50 AM By: Gwenlyn Perking Entered By: Gwenlyn Perking on 02/03/2017 09:29:49 Cornette, [name] ([id number]) -------------------------------------------------------------------------------- Lower Extremity Assessment Details [name] Date of Service: 02/03/2017 8:00 AM Patient Name: [name] Account Number: [id number] Medical Record Treating RN: [name] [id number] Number: Other Clinician: Date of Birth/Sex: 1920-08-08 (83 y.o. Female) Treating [name], [name] Primary Care Sandi Towe: [name] Ysela Hettinger/Extender: G Referring Marshae Azam: [name] Weeks in Treatment: 8 Vascular Assessment Pulses: Dorsalis Pedis Palpable: [Left:Yes] [Right:Yes] Posterior Tibial Extremity colors, hair growth, and  conditions: Extremity Color: [Left:Mottled] [Right:Mottled] Temperature of Extremity: [Left:Warm] [Right:Warm] Capillary Refill: [Left:> 3 seconds] [Right:> 3 seconds] Toe Nail Assessment Left: Right: Thick: Yes Yes Discolored: Yes Yes Deformed: Yes Yes Improper Length and Hygiene: Yes Yes Electronic Signature(s) Signed: 02/03/2017 5:03:57 PM By: [name] Entered By: [name] on 02/03/2017 08:07:04 Clegg, [name] ([id number]) -------------------------------------------------------------------------------- Multi Wound Chart Details [name] Date of Service: 02/03/2017 8:00 AM Patient Name: [name] Account Number: [id number] Medical Record Treating RN: [name] [id number] Number: Other Clinician: Date of Birth/Sex: 07/26/1920 (83 y.o. Female) Treating [name], [name] Primary Care Desiree Daise: [name] Cheynne Virden/Extender: G Referring Yer Castello: [name] Weeks in Treatment: 8 Vital Signs Height(in): 69 Pulse(bpm): 87 Weight(lbs): 105 Blood Pressure 165/72 (  mmHg): Body Mass Index(BMI): 16 Temperature(F): Respiratory Rate 16 (breaths/min): Photos: [10:No Photos] [11:No Photos] [5:No Photos] Wound Location: [10:Right Foot - Dorsal] [11:Right Metatarsal head fifth Right Calcaneus - Plantar] Wounding Event: [10:Pressure Injury] [11:Pressure Injury] [5:Pressure Injury] Primary Etiology: [10:Pressure Ulcer] [11:Pressure Ulcer] [5:Pressure Ulcer] Comorbid History: [10:Cataracts, Anemia, Hypertension, History of pressure wounds, Osteoarthritis, Dementia] [11:Cataracts, Anemia, Hypertension, History of Hypertension, History of pressure wounds, Osteoarthritis, Dementia Osteoarthritis, Dementia]  [5:Cataracts, Anemia, pressure wounds,] Date Acquired: [10:01/25/2017] [11:01/22/2017] [5:10/09/2016] Weeks of Treatment: [10:1] [11:1] [5:8] Wound Status: [10:Open] [11:Open] [5:Open] Clustered Wound: [10:No] [11:No] [5:Yes] Clustered Quantity:  [10:N/A] [11:N/A] [5:2] Pending Amputation on No [11:No] [5:Yes] Presentation: Measurements L x W x D 3x1.5x0.1 [11:1.5x0.6x0.1] [5:5.8x6.5x0.2] (cm) Area (cm) : [10:3.534] [11:0.707] [5:29.61] Volume (cm) : [10:0.353] [11:0.071] [5:5.922] % Reduction in Area: [10:-50.00%] [11:-461.10%] [5:37.20%] % Reduction in Volume: -49.60% [11:-86.80%] [5:-25.70%] Classification: [10:Category/Stage II] [11:Category/Stage II] [5:Category/Stage III] Exudate Amount: [10:Large] [11:Large] [5:Large] Exudate Type: [10:Serosanguineous] [11:Serosanguineous] [5:Purulent] Exudate Color: [10:red, brown] [11:red, brown] [5:yellow, brown, green] Foul Odor After [10:No] [11:No] [5:Yes] Cleansing: Grossberg, Odester L. ([id number]) Odor Anticipated Due to N/A N/A No Product Use: Wound Margin: Distinct, outline attached Distinct, outline attached Distinct, outline attached Granulation Amount: None Present (0%) None Present (0%) Small (1-33%) Granulation Quality: N/A N/A N/A Necrotic Amount: Large (67-100%) Large (67-100%) Large (67-100%) Necrotic Tissue: Eschar Eschar Eschar, Adherent Slough Epithelialization: None None None Debridement: Debridement ([id number]- Debridement (19147- N/A 11047) 11047) Pre-procedure 08:53 08:53 N/A Verification/Time Out Taken: Pain Control: Lidocaine 4% Topical Lidocaine 4% Topical N/A Solution Solution Tissue Debrided: Fibrin/Slough, Exudates, Fibrin/Slough, Exudates, N/A Subcutaneous Subcutaneous Level: Skin/Subcutaneous Skin/Subcutaneous N/A Tissue Tissue Debridement Area (sq 4.5 0.9 N/A cm): Instrument: Blade, Forceps Blade, Forceps N/A Bleeding: Minimum Minimum N/A Hemostasis Achieved: Pressure Pressure N/A Procedural Pain: 0 0 N/A Post Procedural Pain: 0 0 N/A Debridement Treatment Procedure was tolerated Procedure was tolerated N/A Response: well well Post Debridement 3x1.5x0.1 1.5x0.6x0.2 N/A Measurements L x W x D (cm) Post Debridement 0.353 0.141 N/A Volume:  (cm) Post Debridement Category/Stage II Category/Stage II N/A Stage: Periwound Skin Texture: No Abnormalities Noted No Abnormalities Noted No Abnormalities Noted Periwound Skin No Abnormalities Noted No Abnormalities Noted No Abnormalities Noted Moisture: Periwound Skin Color: Erythema: Yes Erythema: Yes Erythema: Yes Hemosiderin Staining: Yes Rubor: Yes Ecchymosis: No Rubor: Yes Erythema Location: Circumferential Circumferential Circumferential Temperature: No Abnormality N/A No Abnormality Tenderness on Yes Yes Yes Palpation: Wound Preparation: Ulcer Cleansing: Ulcer Cleansing: Ulcer Cleansing: Rinsed/Irrigated with Rinsed/Irrigated with Rinsed/Irrigated with Saline Saline Saline Killings, Carnita L. ([id number]) Topical Anesthetic Topical Anesthetic Topical Anesthetic Applied: Other: lidocaine Applied: Other: lidocaine Applied: Other: lidocaine 4% 4% 4% Procedures Performed: Debridement Debridement N/A Wound Number: [id number]  Photos: No Photos No Photos No Photos Wound Location: Left Calcaneus Left Malleolus - Lateral Left Lower Leg - Posterior Wounding Event: Pressure Injury Pressure Injury Pressure Injury Primary Etiology: Pressure Ulcer Pressure Ulcer Pressure Ulcer Comorbid History: Cataracts, Anemia, Cataracts, Anemia, Cataracts, Anemia, Hypertension, History of Hypertension, History of Hypertension, History of pressure wounds, pressure wounds, pressure wounds, Osteoarthritis, Dementia Osteoarthritis, Dementia Osteoarthritis, Dementia Date Acquired: 10/09/2016 10/09/2016 10/09/2016 Weeks of Treatment: 8 8 8  Wound Status: Open Open Open Clustered Wound: No No Yes Clustered Quantity: N/A N/A N/A Pending Amputation on Yes Yes Yes Presentation: Measurements L x W x D 2.5x2x0.1 1x0.8x0.2 7.9x1.7x0.7 (cm) Area (cm) : 3.927 0.628 10.548 Volume (cm) : 0.393 0.126 7.384 % Reduction in Area: 76.70% 82.70% 12.20% % Reduction in Volume: 76.70% 82.60% -2.40% Classification:  Category/Stage II Category/Stage II  Category/Stage IV Exudate Amount: Medium Large Large Exudate Type: Purulent Serosanguineous Purulent Exudate Color: yellow, brown, green red, brown yellow, brown, green Foul Odor After Yes Yes Yes Cleansing: Odor Anticipated Due to No No No Product Use: Wound Margin: Distinct, outline attached Distinct, outline attached Distinct, outline attached Granulation Amount: Medium (34-66%) Large (67-100%) Medium (34-66%) Granulation Quality: Red Red Red Necrotic Amount: Medium (34-66%) Small (1-33%) Medium (34-66%) Necrotic Tissue: Eschar, Adherent Slough Adherent Slough Eschar, Adherent Slough Epithelialization: None None None Debridement: N/A N/A N/A Pain Control: N/A N/A N/A Tissue Debrided: N/A N/A N/A Level: N/A N/A N/A Debridement Area (sq N/A N/A N/A cm): Instrument: N/A N/A N/A Bleeding: N/A N/A N/A JERA, HEADINGS. ([id number]) Hemostasis Achieved: N/A N/A N/A Procedural Pain: N/A N/A N/A Post Procedural Pain: N/A N/A N/A Debridement Treatment N/A N/A N/A Response: Post Debridement N/A N/A N/A Measurements L x W x D (cm) Post Debridement N/A N/A N/A Volume: (cm) Post Debridement N/A N/A N/A Stage: Periwound Skin Texture: No Abnormalities Noted No Abnormalities Noted No Abnormalities Noted Periwound Skin No Abnormalities Noted No Abnormalities Noted No Abnormalities Noted Moisture: Periwound Skin Color: Erythema: Yes Erythema: Yes Erythema: Yes Erythema Location: Circumferential Circumferential Circumferential Temperature: No Abnormality No Abnormality No Abnormality Tenderness on Yes Yes Yes Palpation: Wound Preparation: Ulcer Cleansing: Ulcer Cleansing: Ulcer Cleansing: Rinsed/Irrigated with Rinsed/Irrigated with Rinsed/Irrigated with Saline Saline Saline Topical Anesthetic Topical Anesthetic Topical Anesthetic Applied: Other: lidocaine Applied: Other: lidocaine Applied: Other: lidocaine 4% 4% 4% Procedures Performed: N/A  N/A N/A Treatment Notes Electronic Signature(s) Signed: 02/03/2017 4:47:30 PM By: Baltazar Najjar MD Entered By: Baltazar Najjar on 02/03/2017 09:19:46 Cancel, [name] ([id number]) -------------------------------------------------------------------------------- Multi-Disciplinary Care Plan Details [name] Date of Service: 02/03/2017 8:00 AM Patient Name: [name] Account Number: [id number] Medical Record Treating RN: [name] [id number] Number: Other Clinician: Date of Birth/Sex: 07/24/1920 (83 y.o. Female) Treating [name], [name] Primary Care Darenda Fike: [name] Gunnar Hereford/Extender: G Referring Jonavin Seder: [name] in Treatment: 8 Active Inactive ` Abuse / Safety / Falls / Self Care Management Nursing Diagnoses: Potential for falls Goals: Patient will not experience any injury related to falls Date Initiated: 12/09/2016 Target Resolution Date: 02/20/2017 Goal Status: Active Interventions: Assess fall risk on admission and as needed Notes: ` Nutrition Nursing Diagnoses: Imbalanced nutrition Potential for alteratiion in Nutrition/Potential for imbalanced nutrition Goals: Patient/caregiver agrees to and verbalizes understanding of need to use nutritional supplements and/or vitamins as prescribed Date Initiated: 12/09/2016 Target Resolution Date: 03/27/2017 Goal Status: Active Interventions: Assess patient nutrition upon admission and as needed per policy Notes: ` Orientation to the Wound Care Program TIMARIE, LABELL ([id number]) Nursing Diagnoses: Knowledge deficit related to the wound healing center program Goals: Patient/caregiver will verbalize understanding of the Wound Healing Center Program Date Initiated: 12/09/2016 Target Resolution Date: 12/26/2016 Goal Status: Active Interventions: Provide education on orientation to the wound center Notes: ` Pain, Acute or Chronic Nursing Diagnoses: Pain, acute or chronic: actual  or potential Potential alteration in comfort, pain Goals: Patient/caregiver will verbalize adequate pain control between visits Date Initiated: 12/09/2016 Target Resolution Date: 03/27/2017 Goal Status: Active Interventions: Assess comfort goal upon admission Complete pain assessment as per visit requirements Notes: ` Pressure Nursing Diagnoses: Knowledge deficit related to causes and risk factors for pressure ulcer development Knowledge deficit related to management of pressures ulcers Potential for impaired tissue integrity related to pressure, friction, moisture, and shear Goals: Patient/caregiver will verbalize risk factors for pressure ulcer development Date Initiated: 12/09/2016 Target Resolution Date: 03/27/2017 Goal Status: Active  Interventions: Assess: immobility, friction, shearing, incontinence upon admission and as needed BELLAROSE, [name]. ([id number]) Assess offloading mechanisms upon admission and as needed Notes: ` Wound/Skin Impairment Nursing Diagnoses: Impaired tissue integrity Knowledge deficit related to ulceration/compromised skin integrity Goals: Ulcer/skin breakdown will have a volume reduction of 80% by week 12 Date Initiated: 12/09/2016 Target Resolution Date: 03/20/2017 Goal Status: Active Interventions: Assess patient/caregiver ability to perform ulcer/skin care regimen upon admission and as needed Notes: Electronic Signature(s) Signed: 02/03/2017 5:03:57 PM By: [name] Entered By: [name] on 02/03/2017 08:23:41 Velardi, [name] ([id number]) -------------------------------------------------------------------------------- Non-Wound Condition Assessment Details [name] Date of Service: 02/03/2017 8:00 AM Patient Name: [name] Account Number: [id number] Medical Record Treating RN: [name] [id number] Number: Other Clinician: Date of Birth/Sex: January 18, 1921 (83 y.o. Female) Treating [name], [name] Primary Care  Daleysa Kristiansen: [name] Krystalyn Kubota/Extender: G Referring Christoffer Currier: [name] Weeks in Treatment: 8 Non-Wound Condition: Condition: Suspected Deep Tissue Injury Location: Other: left plantar 5th metatarsal head Side: Left Photos Periwound Skin Texture Texture Color No Abnormalities Noted: No No Abnormalities Noted: No Ecchymosis: Yes Moisture No Abnormalities Noted: No Dry / Scaly: Yes Electronic Signature(s) Signed: 02/03/2017 5:03:57 PM By: [name] Entered By: [name] on 02/03/2017 16:48:53 Wire, [name] ([id number]) -------------------------------------------------------------------------------- Pain Assessment Details [name] Date of Service: 02/03/2017 8:00 AM Patient Name: [name] Account Number: [id number] Medical Record Treating RN: [name] [id number] Number: Other Clinician: Date of Birth/Sex: 22-Feb-1921 (83 y.o. Female) Treating [name], [name] Primary Care Emelly Wurtz: [name] Jessup Ogas/Extender: G Referring Calianna Kim: [name] in Treatment: 8 Active Problems Location of Pain Severity and Description of Pain Patient Has Paino Patient Unable to Respond Site Locations Pain Management and Medication Current Pain Management: Electronic Signature(s) Signed: 02/03/2017 5:03:57 PM By: [name] Entered By: [name] on 02/03/2017 08:05:37 Cullars, [name] ([id number]) -------------------------------------------------------------------------------- Patient/Caregiver Education Details [name] Date of Service: 02/03/2017 8:00 AM Patient Name: [name] Account Number: [id number] Medical Record Treating RN: [name] [id number] Number: Other Clinician: [birth year] (83 y.o. Treating [name], [name] Date of Birth/Gender: Female) Physician/Extender: G Primary Care Physician: [name] in Treatment: 8 Referring Physician: [name] Education  Assessment Education Provided To: Caregiver Education Topics Provided Wound/Skin Impairment: Handouts: Other: wound care as ordered Methods: Demonstration, Explain/Verbal Responses: State content correctly Electronic Signature(s) Signed: 02/03/2017 5:03:57 PM By: [name] Entered By: [name] on 02/03/2017 08:25:23 Postlethwait, [name] ([id number]) -------------------------------------------------------------------------------- Wound Assessment Details [name] Date of Service: 02/03/2017 8:00 AM Patient Name: [name] Account Number: [id number] Medical Record Treating RN: [name] [id number] Number: Other Clinician: Date of Birth/Sex: [birth year] (83 y.o. Female) Treating [name], [name] Primary Care Audra Kagel: [name] Stepen Prins/Extender: G Referring Breeley Bischof: [name] Weeks in Treatment: 8 Wound Status Wound Number: 10 Primary Pressure Ulcer Etiology: Wound Location: Right Foot - Dorsal Wound Open Wounding Event: Pressure Injury Status: Date Acquired: 01/25/2017 Comorbid Cataracts, Anemia, Hypertension, Weeks Of Treatment: 1 History: History of pressure wounds, Clustered Wound: No Osteoarthritis, Dementia Photos Photo Uploaded By: [name] on 02/03/2017 16:42:48 Wound Measurements Length: (cm) 3 Width: (cm) 1.5 Depth: (cm) 0.1 Area: (cm) 3.534 Volume: (cm) 0.353 % Reduction in Area: -50% % Reduction in Volume: -49.6% Epithelialization: None Tunneling: No Undermining: No Wound Description Classification: Category/Stage II Foul Odor Afte Wound Margin: Distinct, outline attached Slough/Fibrino Exudate Amount: Large Exudate Type: Serosanguineous Exudate Color: red, brown r Cleansing: No Yes Wound Bed Granulation Amount: None Present (0%) Necrotic Amount: Large (67-100%) Eagles, Mairim L. ([id number]) Necrotic Quality: Eschar Periwound Skin  Texture Texture Color No Abnormalities Noted: No No  Abnormalities Noted: No Erythema: Yes Moisture Erythema Location: Circumferential No Abnormalities Noted: No Hemosiderin Staining: Yes Rubor: Yes Temperature / Pain Temperature: No Abnormality Tenderness on Palpation: Yes Wound Preparation Ulcer Cleansing: Rinsed/Irrigated with Saline Topical Anesthetic Applied: Other: lidocaine 4%, Electronic Signature(s) Signed: 02/03/2017 5:03:57 PM By: [name] Entered By: [name] on 02/03/2017 08:16:00 Pinkett, [name] ([id number]) -------------------------------------------------------------------------------- Wound Assessment Details [name] Date of Service: 02/03/2017 8:00 AM Patient Name: [name] Account Number: [id number] Medical Record Treating RN: [name] [id number] Number: Other Clinician: Date of Birth/Sex: 29-Aug-1920 (83 y.o. Female) Treating [name], [name] Primary Care Allaya Abbasi: [name] Mahalia Dykes/Extender: G Referring Meleni Delahunt: [name] Weeks in Treatment: 8 Wound Status Wound Number: 11 Primary Pressure Ulcer Etiology: Wound Location: Right Metatarsal head fifth - Plantar Wound Open Status: Wounding Event: Pressure Injury Comorbid Cataracts, Anemia, Hypertension, Date Acquired: 01/22/2017 History: History of pressure wounds, Weeks Of Treatment: 1 Osteoarthritis, Dementia Clustered Wound: No Photos Photo Uploaded By: [name] on 02/03/2017 16:42:49 Wound Measurements Length: (cm) 1.5 Width: (cm) 0.6 Depth: (cm) 0.1 Area: (cm) 0.707 Volume: (cm) 0.071 % Reduction in Area: -461.1% % Reduction in Volume: -86.8% Epithelialization: None Tunneling: No Undermining: No Wound Description Classification: Category/Stage II Wound Margin: Distinct, outline attached Exudate Amount: Large Exudate Type: Serosanguineous Exudate Color: red, brown Foul Odor After Cleansing: No Slough/Fibrino No Wound Bed Granulation Amount: None Present (0%) Necrotic  Amount: Large (67-100%) Wever, Luvena L. ([id number]) Necrotic Quality: Eschar Periwound Skin Texture Texture Color No Abnormalities Noted: No No Abnormalities Noted: No Erythema: Yes Moisture Erythema Location: Circumferential No Abnormalities Noted: No Rubor: Yes Temperature / Pain Tenderness on Palpation: Yes Wound Preparation Ulcer Cleansing: Rinsed/Irrigated with Saline Topical Anesthetic Applied: Other: lidocaine 4%, Electronic Signature(s) Signed: 02/03/2017 5:03:57 PM By: [name] Entered By: [name] on 02/03/2017 08:16:39 Dykman, [name] ([id number]) -------------------------------------------------------------------------------- Wound Assessment Details [name] Date of Service: 02/03/2017 8:00 AM Patient Name: [name] Account Number: [id number] Medical Record Treating RN: [name] [id number] Number: Other Clinician: Date of Birth/Sex: 1921/07/17 (83 y.o. Female) Treating [name], [name] Primary Care Adonai Selsor: [name] Queenie Aufiero/Extender: G Referring Jaymes Revels: [name] Weeks in Treatment: 8 Wound Status Wound Number: 5 Primary Pressure Ulcer Etiology: Wound Location: Right Calcaneus Wound Open Wounding Event: Pressure Injury Status: Date Acquired: 10/09/2016 Comorbid Cataracts, Anemia, Hypertension, Weeks Of Treatment: 8 History: History of pressure wounds, Clustered Wound: Yes Osteoarthritis, Dementia Pending Amputation On Presentation Photos Photo Uploaded By: [name] on 02/03/2017 16:43:29 Wound Measurements Length: (cm) 5.8 % Reduction in Width: (cm) 6.5 % Reduction in Depth: (cm) 0.2 Epithelializat Clustered Quantity: 2 Tunneling: Area: (cm) 29.61 Undermining: Volume: (cm) 5.922 Area: 37.2% Volume: -25.7% ion: None No No Wound Description Classification: Category/Stage III Foul Odor Afte Wound Margin: Distinct, outline attached Due to Product Exudate Amount: Large  Slough/Fibrino Exudate Type: Purulent Exudate Color: yellow, brown, green r Cleansing: Yes Use: No Yes Wound Bed Granulation Amount: Small (1-33%) Brakebill, Olivia L. ([id number]) Necrotic Amount: Large (67-100%) Necrotic Quality: Eschar, Adherent Slough Periwound Skin Texture Texture Color No Abnormalities Noted: No No Abnormalities Noted: No Ecchymosis: No Moisture Erythema: Yes No Abnormalities Noted: No Erythema Location: Circumferential Temperature / Pain Temperature: No Abnormality Tenderness on Palpation: Yes Wound Preparation Ulcer Cleansing: Rinsed/Irrigated with Saline Topical Anesthetic Applied: Other: lidocaine 4%, Electronic Signature(s) Signed: 02/03/2017 5:03:57 PM By: [name] Entered By: [name] on 02/03/2017 08:18:35 Muise, [name] -------------------------------------------------------------------------------- Wound Assessment Details [name] Date of Service: 02/03/2017 8:00  AM Patient Name: [name] Account Number: [id number] Medical Record Treating RN: [name] [id number] Number: Other Clinician: Date of Birth/Sex: [birth year] (83 y.o. Female) Treating [name], [name] Primary Care Preeya Cleckley: [name] Vira Chaplin/Extender: G Referring Katelyn Kohlmeyer: [name] Weeks in Treatment: 8 Wound Status Wound Number: 6 Primary Pressure Ulcer Etiology: Wound Location: Left Calcaneus Wound Open Wounding Event: Pressure Injury Status: Date Acquired: 10/09/2016 Comorbid Cataracts, Anemia, Hypertension, Weeks Of Treatment: 8 History: History of pressure wounds, Clustered Wound: No Osteoarthritis, Dementia Pending Amputation On Presentation Photos Photo Uploaded By: [name] on 02/03/2017 16:43:30 Wound Measurements Length: (cm) 2.5 Width: (cm) 2 Depth: (cm) 0.1 Area: (cm) 3.927 Volume: (cm) 0.393 % Reduction in Area: 76.7% % Reduction in Volume: 76.7% Epithelialization:  None Tunneling: No Undermining: No Wound Description Classification: Category/Stage II Foul Odor Aft Wound Margin: Distinct, outline attached Due to Produc Exudate Amount: Medium Slough/Fibrin Exudate Type: Purulent Exudate Color: yellow, brown, green er Cleansing: Yes t Use: No o Yes Wound Bed Granulation Amount: Medium (34-66%) Granulation Quality: Red Rickenbach, Alexandera L. ([id number]) Necrotic Amount: Medium (34-66%) Necrotic Quality: Eschar, Adherent Slough Periwound Skin Texture Texture Color No Abnormalities Noted: No No Abnormalities Noted: No Erythema: Yes Moisture Erythema Location: Circumferential No Abnormalities Noted: No Temperature / Pain Temperature: No Abnormality Tenderness on Palpation: Yes Wound Preparation Ulcer Cleansing: Rinsed/Irrigated with Saline Topical Anesthetic Applied: Other: lidocaine 4%, Electronic Signature(s) Signed: 02/03/2017 5:03:57 PM By: [name] Entered By: [name] on 02/03/2017 08:19:25 Furnari, [name] ([id number]) -------------------------------------------------------------------------------- Wound Assessment Details [name] Date of Service: 02/03/2017 8:00 AM Patient Name: [name] Account Number: [id number] Medical Record Treating RN: [name] [id number] Number: Other Clinician: Date of Birth/Sex: Nov 29, 1920 (83 y.o. Female) Treating [name], [name] Primary Care Roslynn Holte: [name] Octavion Mollenkopf/Extender: G Referring Wilmetta Speiser: [name] Weeks in Treatment: 8 Wound Status Wound Number: 7 Primary Pressure Ulcer Etiology: Wound Location: Left Malleolus - Lateral Wound Open Wounding Event: Pressure Injury Status: Date Acquired: 10/09/2016 Comorbid Cataracts, Anemia, Hypertension, Weeks Of Treatment: 8 History: History of pressure wounds, Clustered Wound: No Osteoarthritis, Dementia Pending Amputation On Presentation Photos Photo Uploaded By: [name] on  02/03/2017 16:44:13 Wound Measurements Length: (cm) 1 Width: (cm) 0.8 Depth: (cm) 0.2 Area: (cm) 0.628 Volume: (cm) 0.126 % Reduction in Area: 82.7% % Reduction in Volume: 82.6% Epithelialization: None Tunneling: No Undermining: No Wound Description Classification: Category/Stage II Foul Odor Aft Wound Margin: Distinct, outline attached Due to Produc Exudate Amount: Large Slough/Fibrin Exudate Type: Serosanguineous Exudate Color: red, brown er Cleansing: Yes t Use: No o Yes Wound Bed Granulation Amount: Large (67-100%) Granulation Quality: Red Worland, Brittanya L. ([id number]) Necrotic Amount: Small (1-33%) Necrotic Quality: Adherent Slough Periwound Skin Texture Texture Color No Abnormalities Noted: No No Abnormalities Noted: No Erythema: Yes Moisture Erythema Location: Circumferential No Abnormalities Noted: No Temperature / Pain Temperature: No Abnormality Tenderness on Palpation: Yes Wound Preparation Ulcer Cleansing: Rinsed/Irrigated with Saline Topical Anesthetic Applied: Other: lidocaine 4%, Electronic Signature(s) Signed: 02/03/2017 5:03:57 PM By: [name] Entered By: [name] on 02/03/2017 08:19:59 Longmore, [name] ([id number]) -------------------------------------------------------------------------------- Wound Assessment Details [name] Date of Service: 02/03/2017 8:00 AM Patient Name: [name] Account Number: [id number] Medical Record Treating RN: [name] [id number] Number: Other Clinician: Date of Birth/Sex: 03-Jan-1921 (83 y.o. Female) Treating [name], [name] Primary Care Reesha Debes: [name] Janki Dike/Extender: G Referring Michele Judy: [name] Weeks in Treatment: 8 Wound Status Wound Number: 8 Primary Pressure Ulcer Etiology: Wound Location: Left Lower Leg - Posterior Wound Open Wounding Event: Pressure Injury Status: Date Acquired:  10/09/2016 Comorbid Cataracts, Anemia,  Hypertension, Weeks Of Treatment: 8 History: History of pressure wounds, Clustered Wound: Yes Osteoarthritis, Dementia Pending Amputation On Presentation Photos Photo Uploaded By: Alejandro MullingPinkerton, Debra on 02/03/2017 16:44:14 Wound Measurements Length: (cm) 7.9 Width: (cm) 1.7 Depth: (cm) 0.7 Area: (cm) 10.548 Volume: (cm) 7.384 % Reduction in Area: 12.2% % Reduction in Volume: -2.4% Epithelialization: None Tunneling: No Undermining: No Wound Description Classification: Category/Stage IV Foul Odor Aft Wound Margin: Distinct, outline attached Due to Produc Exudate Amount: Large Slough/Fibrin Exudate Type: Purulent Exudate Color: yellow, brown, green er Cleansing: Yes t Use: No o Yes Wound Bed Granulation Amount: Medium (34-66%) Exposed Structure Granulation Quality: Red Fascia Exposed: No Vokes, Hiromi L. ([id number]) Necrotic Amount: Medium (34-66%) Fat Layer (Subcutaneous Tissue) Exposed: Yes Necrotic Quality: Eschar, Adherent Slough Tendon Exposed: Yes Muscle Exposed: No Joint Exposed: No Bone Exposed: No Periwound Skin Texture Texture Color No Abnormalities Noted: No No Abnormalities Noted: No Erythema: Yes Moisture Erythema Location: Circumferential No Abnormalities Noted: No Temperature / Pain Temperature: No Abnormality Tenderness on Palpation: Yes Wound Preparation Ulcer Cleansing: Rinsed/Irrigated with Saline Topical Anesthetic Applied: Other: lidocaine 4%, Electronic Signature(s) Signed: 02/03/2017 5:03:57 PM By: Alejandro MullingPinkerton, Debra Entered By: Alejandro MullingPinkerton, Debra on 02/03/2017 08:21:55 Brunker, [name] ([id number]) -------------------------------------------------------------------------------- Vitals Details [name], Tana Date of Service: 02/03/2017 8:00 AM Patient Name: [name] Account Number: [id number] Medical Record Treating RN: Phillis Haggisinkerton, Debi [id number] Number: Other Clinician: Date of Birth/Sex: [birth year] (83 y.o. Female)  Treating [name], [name] Primary Care Leila Schuff: Antony HasteBADGER, [name] Aroura Vasudevan/Extender: G Referring Nakia Remmers: Antony HasteBADGER, [name] Weeks in Treatment: 8 Vital Signs Time Taken: 08:14 Pulse (bpm): 87 Height (in): 69 Respiratory Rate (breaths/min): 16 Weight (lbs): 105 Blood Pressure (mmHg): 165/72 Body Mass Index (BMI): 15.5 Reference Range: 80 - 120 mg / dl Electronic Signature(s) Signed: 02/03/2017 5:03:57 PM By: Alejandro MullingPinkerton, Debra Entered By: Alejandro MullingPinkerton, Debra on 02/03/2017 08:14:07

## 2017-02-10 ENCOUNTER — Encounter: Payer: Medicare Other | Admitting: Internal Medicine

## 2017-02-10 DIAGNOSIS — L89623 Pressure ulcer of left heel, stage 3: Secondary | ICD-10-CM | POA: Diagnosis not present

## 2017-02-12 NOTE — Progress Notes (Signed)
[name], Lashawndra L. ([id number]) Visit Report for 02/10/2017 Arrival Information Details [name], Allean Date of Service: 02/10/2017 3:30 PM Patient Name: [name] Account Number: [id number] Medical Record Treating RN: Phillis Haggisinkerton, Debi [id number] Number: Other Clinician: Date of Birth/Sex: 09-Sep-1920 (83 y.o. Female) Treating [name], [name] Primary Care Chibuike Fleek: Antony HasteBADGER, [name] Heavyn Yearsley/Extender: G Referring Dorenda Pfannenstiel: Wallene DalesBADGER, [name] Weeks in Treatment: 9 Visit Information History Since Last Visit All ordered tests and consults were No Patient Arrived: Wheel Chair completed: Arrival Time: 15:34 Added or deleted any medications: No Accompanied By: son Any new allergies or adverse No Transfer Assistance: Other reactions: Patient Identification Verified: Yes Had a fall or experienced change in No Secondary Verification Process Yes activities of daily living that may Completed: affect Patient Requires Transmission- No risk of falls: Based Precautions: Signs or symptoms of abuse/neglect No Patient Has Alerts: Yes since last visito Patient Alerts: L ABI non- Hospitalized since last visit: No compressible Has Dressing in Place as Yes R ABI non- Prescribed: compressible Pain Present Now: Unable to Respond Electronic Signature(s) Signed: 02/10/2017 5:54:59 PM By: Alejandro MullingPinkerton, Debra Entered By: Alejandro MullingPinkerton, Debra on 02/10/2017 15:37:50 Pernice, [name] ([id number]) -------------------------------------------------------------------------------- Encounter Discharge Information Details [name], Waylon Date of Service: 02/10/2017 3:30 PM Patient Name: [name] Account Number: [id number] Medical Record Treating RN: Phillis Haggisinkerton, Debi [id number] Number: Other Clinician: Date of Birth/Sex: 09-Sep-1920 (83 y.o. Female) Treating [name], [name] Primary Care Garan Frappier: Antony HasteBADGER, [name] Davidjames Blansett/Extender: G Referring Vaun Hyndman: Wallene DalesBADGER, [name] Weeks in Treatment: 9 Encounter  Discharge Information Items Discharge Condition: Stable Ambulatory Status: Wheelchair Discharge Destination: Home Transportation: Private Auto Accompanied By: son Schedule Follow-up Appointment: Yes Medication Reconciliation completed and provided to Patient/Care No Jayde Daffin: Provided on Clinical Summary of Care: 02/10/2017 Form Type Recipient Paper Patient ES Electronic Signature(s) Signed: 02/10/2017 5:54:59 PM By: Alejandro MullingPinkerton, Debra Previous Signature: 02/10/2017 5:04:19 PM Version By: Gwenlyn PerkingMoore, Shelia Entered By: Alejandro MullingPinkerton, Debra on 02/10/2017 17:25:09 Laden, [name] ([id number]) -------------------------------------------------------------------------------- Lower Extremity Assessment Details [name], Althea Date of Service: 02/10/2017 3:30 PM Patient Name: [name] Account Number: [id number] Medical Record Treating RN: Phillis Haggisinkerton, Debi [id number] Number: Other Clinician: Date of Birth/Sex: 09-Sep-1920 (83 y.o. Female) Treating [name], [name] Primary Care Ineta Sinning: Antony HasteBADGER, [name] Alejandro Gamel/Extender: G Referring Braylyn Kalter: Wallene DalesBADGER, [name] Weeks in Treatment: 9 Vascular Assessment Pulses: Dorsalis Pedis Palpable: [Left:Yes] [Right:Yes] Posterior Tibial Extremity colors, hair growth, and conditions: Extremity Color: [Left:Mottled] [Right:Mottled] Temperature of Extremity: [Left:Warm] [Right:Warm] Capillary Refill: [Left:> 3 seconds] [Right:> 3 seconds] Toe Nail Assessment Left: Right: Thick: Yes Yes Discolored: Yes Yes Deformed: Yes Yes Improper Length and Hygiene: Yes Yes Electronic Signature(s) Signed: 02/10/2017 5:54:59 PM By: Alejandro MullingPinkerton, Debra Entered By: Alejandro MullingPinkerton, Debra on 02/10/2017 16:13:54 Demarinis, [name] ([id number]) -------------------------------------------------------------------------------- Multi Wound Chart Details [name], Jamielyn Date of Service: 02/10/2017 3:30 PM Patient Name: [name] Account Number: [id number] Medical Record  Treating RN: Phillis Haggisinkerton, Debi [id number] Number: Other Clinician: Date of Birth/Sex: 09-Sep-1920 (83 y.o. Female) Treating [name], [name] Primary Care Rachel Samples: Antony HasteBADGER, [name] Lynnette Pote/Extender: G Referring Tallan Sandoz: Antony HasteBADGER, [name] Weeks in Treatment: 9 Vital Signs Height(in): 69 Pulse(bpm): 79 Weight(lbs): 105 Blood Pressure 172/69 (mmHg): Body Mass Index(BMI): 16 Temperature(F): 98.0 Respiratory Rate 16 (breaths/min): Photos: Wound Location: Right Foot - Dorsal Right Metatarsal head fifth Right Trochanter - Plantar Wounding Event: Pressure Injury Pressure Injury Pressure Injury Primary Etiology: Pressure Ulcer Pressure Ulcer Pressure Ulcer Comorbid History: Cataracts, Anemia, Cataracts, Anemia, Cataracts, Anemia, Hypertension, History of Hypertension, History of Hypertension, History of pressure wounds, pressure wounds, pressure wounds, Osteoarthritis, Dementia Osteoarthritis, Dementia Osteoarthritis, Dementia Date Acquired: 01/25/2017 01/22/2017 11/11/2016 Weeks of Treatment: 2  2 0 Wound Status: Open Open Open Clustered Wound: No No No Clustered Quantity: N/A N/A N/A Pending Amputation on No No No Presentation: Measurements L x W x D 2.2x1.9x0.1 2x0.8x0.3 0.7x0.4x0.1 (cm) Area (cm) : 3.283 1.257 0.22 Volume (cm) : 0.328 0.377 0.022 % Reduction in Area: -39.30% -897.60% 0.00% % Reduction in Volume: -39.00% -892.10% 0.00% Bowie, Linzey L. ([id number]) Undermining: No No No Classification: Category/Stage II Category/Stage II Category/Stage II Exudate Amount: Large Large Large Exudate Type: Serosanguineous Serosanguineous Serous Exudate Color: red, brown red, brown amber Foul Odor After No No No Cleansing: Odor Anticipated Due to N/A N/A N/A Product Use: Wound Margin: Distinct, outline attached Distinct, outline attached Flat and Intact Granulation Amount: Small (1-33%) None Present (0%) Large (67-100%) Granulation Quality: N/A N/A Red Necrotic Amount: Large  (67-100%) Large (67-100%) None Present (0%) Necrotic Tissue: Eschar, Adherent Slough Eschar N/A Epithelialization: None None None Debridement: N/A N/A N/A Pain Control: N/A N/A N/A Level: N/A N/A N/A Debridement Area (sq N/A N/A N/A cm): Instrument: N/A N/A N/A Bleeding: N/A N/A N/A Hemostasis Achieved: N/A N/A N/A Procedural Pain: N/A N/A N/A Post Procedural Pain: N/A N/A N/A Debridement Treatment N/A N/A N/A Response: Post Debridement N/A N/A N/A Measurements L x W x D (cm) Post Debridement N/A N/A N/A Volume: (cm) Post Debridement N/A N/A N/A Stage: Periwound Skin Texture: Induration: Yes No Abnormalities Noted No Abnormalities Noted Periwound Skin Maceration: No No Abnormalities Noted Maceration: Yes Moisture: Periwound Skin Color: Erythema: Yes Erythema: Yes No Abnormalities Noted Hemosiderin Staining: Yes Rubor: Yes Rubor: Yes Erythema Location: Circumferential Circumferential N/A Temperature: No Abnormality N/A No Abnormality Tenderness on Yes Yes Yes Palpation: Wound Preparation: Ulcer Cleansing: Ulcer Cleansing: Ulcer Cleansing: Rinsed/Irrigated with Rinsed/Irrigated with Rinsed/Irrigated with Saline Saline Saline Topical Anesthetic Topical Anesthetic Topical Anesthetic GLORIANNA, GOTT. ([id number]) Applied: Other: lidocaine Applied: Other: lidocaine Applied: Other: lidocaine 4% 4% 4% Procedures Performed: N/A N/A N/A Wound Number: [id number]  Photos: Wound Location: Sacrum Right Calcaneus Left Calcaneus Wounding Event: Pressure Injury Pressure Injury Pressure Injury Primary Etiology: Pressure Ulcer Pressure Ulcer Pressure Ulcer Comorbid History: Cataracts, Anemia, Cataracts, Anemia, Cataracts, Anemia, Hypertension, History of Hypertension, History of Hypertension, History of pressure wounds, pressure wounds, pressure wounds, Osteoarthritis, Dementia Osteoarthritis, Dementia Osteoarthritis, Dementia Date Acquired: 03/20/2016 10/09/2016 10/09/2016 Weeks of  Treatment: 0 9 9 Wound Status: Open Open Open Clustered Wound: No Yes No Clustered Quantity: N/A 2 N/A Pending Amputation on No Yes Yes Presentation: Measurements L x W x D 0.7x0.5x1 5.7x6.3x0.5 2.3x1.5x0.6 (cm) Area (cm) : 0.275 28.204 2.71 Volume (cm) : 0.275 14.102 1.626 % Reduction in Area: 0.00% 40.10% 84.00% % Reduction in Volume: 0.00% -199.30% 3.70% Starting Position 1 12 (o'clock): Ending Position 1 12 (o'clock): Maximum Distance 1 0.4 (cm): Undermining: Yes No No Classification: Category/Stage IV Category/Stage III Category/Stage II Exudate Amount: Large Large Large Exudate Type: Serous Purulent Purulent Exudate Color: amber yellow, brown, green yellow, brown, green Foul Odor After No Yes Yes Cleansing: Odor Anticipated Due to N/A No No Product Use: Wound Margin: Distinct, outline attached Distinct, outline attached Distinct, outline attached Granulation Amount: Large (67-100%) Small (1-33%) Small (1-33%) Marrin, Jonica L. ([id number]) Granulation Quality: Red N/A Red Necrotic Amount: None Present (0%) Large (67-100%) Large (67-100%) Necrotic Tissue: N/A Eschar, Adherent Slough Eschar, Adherent Slough Epithelialization: None None None Debridement: N/A Debridement ([id number]) Pre-procedure N/A 16:26 N/A Verification/Time Out Taken: Pain Control: N/A Lidocaine 4% Topical N/A Solution Level: N/A Skin/Subcutaneous N/A Tissue Debridement Area (sq N/A 35.91 N/A cm):  Instrument: N/A Blade, Forceps N/A Bleeding: N/A Minimum N/A Hemostasis Achieved: N/A Pressure N/A Procedural Pain: N/A 0 N/A Post Procedural Pain: N/A 0 N/A Debridement Treatment N/A Procedure was tolerated N/A Response: well Post Debridement N/A 5.7x6.3x0.5 N/A Measurements L x W x D (cm) Post Debridement N/A 14.102 N/A Volume: (cm) Post Debridement N/A Category/Stage III N/A Stage: Periwound Skin Texture: No Abnormalities Noted Induration: Yes Induration: Yes Periwound  Skin No Abnormalities Noted Maceration: Yes Maceration: Yes Moisture: Periwound Skin Color: Erythema: Yes Erythema: Yes Erythema: Yes Ecchymosis: No Erythema Location: Circumferential Circumferential Circumferential Temperature: No Abnormality No Abnormality No Abnormality Tenderness on No Yes Yes Palpation: Wound Preparation: Ulcer Cleansing: Ulcer Cleansing: Ulcer Cleansing: Rinsed/Irrigated with Rinsed/Irrigated with Rinsed/Irrigated with Saline Saline Saline Topical Anesthetic Topical Anesthetic Topical Anesthetic Applied: Other: lidocaine Applied: Other: lidocaine Applied: Other: lidocaine 4% 4% 4% Procedures Performed: N/A Debridement N/A Wound Number: [id number] N/A Photos: N/A ALAWNA, GRAYBEAL ([id number]) Wound Location: Left Malleolus - Lateral Left Lower Leg - Posterior N/A Wounding Event: Pressure Injury Pressure Injury N/A Primary Etiology: Pressure Ulcer Pressure Ulcer N/A Comorbid History: Cataracts, Anemia, Cataracts, Anemia, N/A Hypertension, History of Hypertension, History of pressure wounds, pressure wounds, Osteoarthritis, Dementia Osteoarthritis, Dementia Date Acquired: 10/09/2016 10/09/2016 N/A Weeks of Treatment: 9 9 N/A Wound Status: Open Open N/A Clustered Wound: No Yes N/A Clustered Quantity: N/A N/A N/A Pending Amputation on Yes Yes N/A Presentation: Measurements L x W x D 0.4x0.5x0.1 2.4x2.5x0.8 N/A (cm) Area (cm) : 0.157 4.712 N/A Volume (cm) : 0.016 3.77 N/A % Reduction in Area: 95.70% 60.80% N/A % Reduction in Volume: 97.80% 47.70% N/A Starting Position 1 12 (o'clock): Ending Position 1 (o'clock): Maximum Distance 1 0.4 (cm): Undermining: No Yes N/A Classification: Category/Stage II Category/Stage IV N/A Exudate Amount: Large Large N/A Exudate Type: Serosanguineous Serosanguineous N/A Exudate Color: red, brown red, brown N/A Foul Odor After Yes Yes N/A Cleansing: Odor Anticipated Due to No No N/A Product Use: Wound Margin:  Distinct, outline attached Distinct, outline attached N/A Granulation Amount: Large (67-100%) Medium (34-66%) N/A Granulation Quality: Red Red N/A Necrotic Amount: Small (1-33%) Medium (34-66%) N/A Necrotic Tissue: Adherent Slough Eschar, Adherent Slough N/A Epithelialization: None None N/A Debridement: N/A N/A N/A Pain Control: N/A N/A N/A Level: N/A N/A N/A Janoski, Judaea L. ([id number]) Debridement Area (sq N/A N/A N/A cm): Instrument: N/A N/A N/A Bleeding: N/A N/A N/A Hemostasis Achieved: N/A N/A N/A Procedural Pain: N/A N/A N/A Post Procedural Pain: N/A N/A N/A Debridement Treatment N/A N/A N/A Response: Post Debridement N/A N/A N/A Measurements L x W x D (cm) Post Debridement N/A N/A N/A Volume: (cm) Post Debridement N/A N/A N/A Stage: Periwound Skin Texture: No Abnormalities Noted No Abnormalities Noted N/A Periwound Skin No Abnormalities Noted No Abnormalities Noted N/A Moisture: Periwound Skin Color: Erythema: Yes Erythema: Yes N/A Erythema Location: Circumferential Circumferential N/A Temperature: No Abnormality No Abnormality N/A Tenderness on Yes Yes N/A Palpation: Wound Preparation: Ulcer Cleansing: Ulcer Cleansing: N/A Rinsed/Irrigated with Rinsed/Irrigated with Saline Saline Topical Anesthetic Topical Anesthetic Applied: Other: lidocaine Applied: Other: lidocaine 4% 4% Procedures Performed: N/A N/A N/A Treatment Notes Wound #10 (Right, Dorsal Foot) 1. Cleansed with: Clean wound with Normal Saline 2. Anesthetic Topical Lidocaine 4% cream to wound bed prior to debridement 3. Peri-wound Care: Skin Prep 4. Dressing Applied: Iodoflex 5. Secondary Dressing Applied Bordered Foam Dressing Dry Gauze Wound #11 (Right, Plantar Metatarsal head fifth) Laprise, Markayla L. ([id number]) 1. Cleansed with: Clean wound with Normal Saline 2. Anesthetic Topical Lidocaine 4% cream to  wound bed prior to debridement 3. Peri-wound Care: Skin Prep 4.  Dressing Applied: Iodoflex 5. Secondary Dressing Applied Bordered Foam Dressing Dry Gauze Wound #12 (Right Trochanter) 1. Cleansed with: Clean wound with Normal Saline 2. Anesthetic Topical Lidocaine 4% cream to wound bed prior to debridement 3. Peri-wound Care: Skin Prep 4. Dressing Applied: Prisma Ag 5. Secondary Dressing Applied Bordered Foam Dressing Dry Gauze Wound #13 (Sacrum) 1. Cleansed with: Clean wound with Normal Saline 2. Anesthetic Topical Lidocaine 4% cream to wound bed prior to debridement 3. Peri-wound Care: Skin Prep 4. Dressing Applied: Prisma Ag 5. Secondary Dressing Applied Bordered Foam Dressing Dry Gauze Wound #5 (Right Calcaneus) 1. Cleansed with: Clean wound with Normal Saline 2. Anesthetic Topical Lidocaine 4% cream to wound bed prior to debridement 4. Dressing Applied: Iodoflex 5. Secondary Dressing Applied Dry Gauze Zale, Shineka L. ([id number]) Foam Kerlix/Conform 7. Secured with Tape Notes heel cup Wound #6 (Left Calcaneus) 1. Cleansed with: Clean wound with Normal Saline 2. Anesthetic Topical Lidocaine 4% cream to wound bed prior to debridement 4. Dressing Applied: Iodoflex 5. Secondary Dressing Applied Dry Gauze Foam Kerlix/Conform 7. Secured with Tape Notes heel cup Wound #7 (Left, Lateral Malleolus) 1. Cleansed with: Clean wound with Normal Saline 2. Anesthetic Topical Lidocaine 4% cream to wound bed prior to debridement 3. Peri-wound Care: Skin Prep 4. Dressing Applied: Prisma Ag 5. Secondary Dressing Applied Bordered Foam Dressing Dry Gauze Wound #8 (Left, Posterior Lower Leg) 1. Cleansed with: Clean wound with Normal Saline 2. Anesthetic Topical Lidocaine 4% cream to wound bed prior to debridement 3. Peri-wound Care: Skin Prep 4. Dressing Applied: Iodoflex 5. Secondary Dressing Applied ANWITHA, MAPES ([id number]) Bordered Foam Dressing Dry Gauze Electronic Signature(s) Signed: 02/10/2017  6:25:53 PM By: Baltazar Najjar MD Previous Signature: 02/10/2017 5:54:59 PM Version By: [name] Entered By: Baltazar Najjar on 02/10/2017 18:07:08 Ruland, [name] ([id number]) -------------------------------------------------------------------------------- Multi-Disciplinary Care Plan Details [name] Date of Service: 02/10/2017 3:30 PM Patient Name: [name] Account Number: [id number] Medical Record Treating RN: [name] [id number] Number: Other Clinician: Date of Birth/Sex: 08-Mar-1921 (83 y.o. Female) Treating [name], [name] Primary Care Avriana Joo: [name] Annessa Satre/Extender: G Referring Carless Slatten: [name] in Treatment: 9 Active Inactive ` Abuse / Safety / Falls / Self Care Management Nursing Diagnoses: Potential for falls Goals: Patient will not experience any injury related to falls Date Initiated: 12/09/2016 Target Resolution Date: 02/20/2017 Goal Status: Active Interventions: Assess fall risk on admission and as needed Notes: ` Nutrition Nursing Diagnoses: Imbalanced nutrition Potential for alteratiion in Nutrition/Potential for imbalanced nutrition Goals: Patient/caregiver agrees to and verbalizes understanding of need to use nutritional supplements and/or vitamins as prescribed Date Initiated: 12/09/2016 Target Resolution Date: 03/27/2017 Goal Status: Active Interventions: Assess patient nutrition upon admission and as needed per policy Notes: ` Orientation to the Wound Care Program KATHREEN, DILEO ([id number]) Nursing Diagnoses: Knowledge deficit related to the wound healing center program Goals: Patient/caregiver will verbalize understanding of the Wound Healing Center Program Date Initiated: 12/09/2016 Target Resolution Date: 12/26/2016 Goal Status: Active Interventions: Provide education on orientation to the wound center Notes: ` Pain, Acute or Chronic Nursing Diagnoses: Pain, acute or chronic:  actual or potential Potential alteration in comfort, pain Goals: Patient/caregiver will verbalize adequate pain control between visits Date Initiated: 12/09/2016 Target Resolution Date: 03/27/2017 Goal Status: Active Interventions: Assess comfort goal upon admission Complete pain assessment as per visit requirements Notes: ` Pressure Nursing Diagnoses: Knowledge deficit related to causes and risk factors for pressure ulcer development Knowledge  deficit related to management of pressures ulcers Potential for impaired tissue integrity related to pressure, friction, moisture, and shear Goals: Patient/caregiver will verbalize risk factors for pressure ulcer development Date Initiated: 12/09/2016 Target Resolution Date: 03/27/2017 Goal Status: Active Interventions: Assess: immobility, friction, shearing, incontinence upon admission and as needed IYANNA, DRUMMER. ([id number]) Assess offloading mechanisms upon admission and as needed Notes: ` Wound/Skin Impairment Nursing Diagnoses: Impaired tissue integrity Knowledge deficit related to ulceration/compromised skin integrity Goals: Ulcer/skin breakdown will have a volume reduction of 80% by week 12 Date Initiated: 12/09/2016 Target Resolution Date: 03/20/2017 Goal Status: Active Interventions: Assess patient/caregiver ability to perform ulcer/skin care regimen upon admission and as needed Notes: Electronic Signature(s) Signed: 02/10/2017 5:54:59 PM By: [name] Entered By: [name] on 02/10/2017 16:19:08 Keltner, [name] ([id number]) -------------------------------------------------------------------------------- Non-Wound Condition Assessment Details [name] Date of Service: 02/10/2017 3:30 PM Patient Name: [name] Account Number: [id number] Medical Record Treating RN: [name] [id number] Number: Other Clinician: Date of Birth/Sex: July 08, 1921 (83 y.o. Female) Treating [name],  [name] Primary Care Laticia Vannostrand: [name] Jett Kulzer/Extender: G Referring Amayrani Bennick: [name] Weeks in Treatment: 9 Non-Wound Condition: Condition: Suspected Deep Tissue Injury Location: Other: left plantar 5th metatarsal head Side: Left Photos Periwound Skin Texture Texture Color No Abnormalities Noted: No No Abnormalities Noted: No Ecchymosis: Yes Moisture No Abnormalities Noted: No Dry / Scaly: Yes Electronic Signature(s) Signed: 02/11/2017 4:39:46 PM By: [name] Entered By: [name] on 02/10/2017 17:56:36 Burkland, [name] ([id number]) -------------------------------------------------------------------------------- Pain Assessment Details [name] Date of Service: 02/10/2017 3:30 PM Patient Name: [name] Account Number: [id number] Medical Record Treating RN: [name] [id number] Number: Other Clinician: Date of Birth/Sex: 1920-09-26 (83 y.o. Female) Treating [name], [name] Primary Care Elodia Haviland: [name] Goldia Ligman/Extender: G Referring Tyreek Clabo: [name] in Treatment: 9 Active Problems Location of Pain Severity and Description of Pain Patient Has Paino Patient Unable to Respond Site Locations Pain Management and Medication Current Pain Management: Electronic Signature(s) Signed: 02/10/2017 5:54:59 PM By: [name] Entered By: [name] on 02/10/2017 15:37:55 Wendorff, [name] ([id number]) -------------------------------------------------------------------------------- Patient/Caregiver Education Details [name] Date of Service: 02/10/2017 3:30 PM Patient Name: [name] Account Number: [id number] Medical Record Treating RN: [name] [id number] Number: Other Clinician: September 16, 1920 (83 y.o. Treating [name], [name] Date of Birth/Gender: Female) Physician/Extender: G Primary Care Physician: [name] in Treatment: 9 Referring Physician: [name] Education Assessment Education Provided To: Caregiver son Education Topics Provided Wound/Skin Impairment: Handouts: Other: change dressing as ordered Methods: Demonstration, Explain/Verbal Responses: State content correctly Electronic Signature(s) Signed: 02/10/2017 5:54:59 PM By: [name] Entered By: [name] on 02/10/2017 17:25:26 [place], [name] ([id number]) -------------------------------------------------------------------------------- Wound Assessment Details [name] Date of Service: 02/10/2017 3:30 PM Patient Name: [name] Account Number: [id number] Medical Record Treating RN: [name] [id number] Number: Other Clinician: Date of Birth/Sex: [birth year] (83 y.o. Female) Treating [name], [name] Primary Care Stori Royse: [name] Novah Nessel/Extender: G Referring Zani Kyllonen: [name] Weeks in Treatment: 9 Wound Status Wound Number: 10 Primary Pressure Ulcer Etiology: Wound Location: Right Foot - Dorsal Wound Open Wounding Event: Pressure Injury Status: Date Acquired: 01/25/2017 Comorbid Cataracts, Anemia, Hypertension, Weeks Of Treatment: 2 History: History of pressure wounds, Clustered Wound: No Osteoarthritis, Dementia Photos Photo Uploaded By: [name] on 02/10/2017 17:51:01 Wound Measurements Length: (cm) 2.2 Width: (cm) 1.9 Depth: (cm) 0.1 Area: (cm) 3.283 Volume: (cm) 0.328 % Reduction in Area: -39.3% % Reduction in Volume: -39% Epithelialization: None Tunneling: No Undermining: No Wound Description Classification: Category/Stage II Foul Odor Afte  Wound Margin: Distinct, outline attached Slough/Fibrino Exudate Amount: Large Exudate Type: Serosanguineous Exudate Color: red, brown r Cleansing: No Yes Wound Bed Granulation Amount: Small (1-33%) Necrotic Amount: Large (67-100%) Husser, Natania L. ([id number]) Necrotic Quality: Eschar, Adherent Slough Periwound Skin Texture Texture  Color No Abnormalities Noted: No No Abnormalities Noted: No Induration: Yes Erythema: Yes Erythema Location: Circumferential Moisture Hemosiderin Staining: Yes No Abnormalities Noted: No Rubor: Yes Maceration: No Temperature / Pain Temperature: No Abnormality Tenderness on Palpation: Yes Wound Preparation Ulcer Cleansing: Rinsed/Irrigated with Saline Topical Anesthetic Applied: Other: lidocaine 4%, Treatment Notes Wound #10 (Right, Dorsal Foot) 1. Cleansed with: Clean wound with Normal Saline 2. Anesthetic Topical Lidocaine 4% cream to wound bed prior to debridement 3. Peri-wound Care: Skin Prep 4. Dressing Applied: Iodoflex 5. Secondary Dressing Applied Bordered Foam Dressing Dry Gauze Electronic Signature(s) Signed: 02/10/2017 5:54:59 PM By: [name] Entered By: [name] on 02/10/2017 16:15:20 Kakar, [name] ([id number]) -------------------------------------------------------------------------------- Wound Assessment Details [name] Date of Service: 02/10/2017 3:30 PM Patient Name: [name] Account Number: [id number] Medical Record Treating RN: [name] [id number] Number: Other Clinician: Date of Birth/Sex: 12-22-1920 (83 y.o. Female) Treating [name], [name] Primary Care Jodeci Roarty: [name] [name]/Extender: G Referring Luisfernando Brightwell: [name] Weeks in Treatment: 9 Wound Status Wound Number: 11 Primary Pressure Ulcer Etiology: Wound Location: Right Metatarsal head fifth - Plantar Wound Open Status: Wounding Event: Pressure Injury Comorbid Cataracts, Anemia, Hypertension, Date Acquired: 01/22/2017 History: History of pressure wounds, Weeks Of Treatment: 2 Osteoarthritis, Dementia Clustered Wound: No Photos Photo Uploaded By: [name] on 02/10/2017 17:51:57 Wound Measurements Length: (cm) 2 Width: (cm) 0.8 Depth: (cm) 0.3 Area: (cm) 1.257 Volume: (cm) 0.377 % Reduction in Area: -897.6% %  Reduction in Volume: -892.1% Epithelialization: None Tunneling: No Undermining: No Wound Description Classification: Category/Stage II Wound Margin: Distinct, outline attached Exudate Amount: Large Exudate Type: Serosanguineous Exudate Color: red, brown Foul Odor After Cleansing: No Slough/Fibrino No Wound Bed Granulation Amount: None Present (0%) Necrotic Amount: Large (67-100%) Kolodziejski, Lenora L. ([id number]) Necrotic Quality: Eschar Periwound Skin Texture Texture Color No Abnormalities Noted: No No Abnormalities Noted: No Erythema: Yes Moisture Erythema Location: Circumferential No Abnormalities Noted: No Rubor: Yes Temperature / Pain Tenderness on Palpation: Yes Wound Preparation Ulcer Cleansing: Rinsed/Irrigated with Saline Topical Anesthetic Applied: Other: lidocaine 4%, Treatment Notes Wound #11 (Right, Plantar Metatarsal head fifth) 1. Cleansed with: Clean wound with Normal Saline 2. Anesthetic Topical Lidocaine 4% cream to wound bed prior to debridement 3. Peri-wound Care: Skin Prep 4. Dressing Applied: Iodoflex 5. Secondary Dressing Applied Bordered Foam Dressing Dry Gauze Electronic Signature(s) Signed: 02/10/2017 5:54:59 PM By: [name] Entered By: [name] on 02/10/2017 16:15:47 Cirelli, [name] ([id number]) -------------------------------------------------------------------------------- Wound Assessment Details [name] Date of Service: 02/10/2017 3:30 PM Patient Name: [name] Account Number: [id number] Medical Record Treating RN: [name] [id number] Number: Other Clinician: Date of Birth/Sex: 04/28/1921 (83 y.o. Female) Treating [name], [name] Primary Care Renny Remer: [name] Julena Barbour/Extender: G Referring Darius Fillingim: [name] Weeks in Treatment: 9 Wound Status Wound Number: 12 Primary Pressure Ulcer Etiology: Wound Location: Right Trochanter Wound Open Wounding Event: Pressure  Injury Status: Date Acquired: 11/11/2016 Comorbid Cataracts, Anemia, Hypertension, Weeks Of Treatment: 0 History: History of pressure wounds, Clustered Wound: No Osteoarthritis, Dementia Photos Photo Uploaded By: [name] on 02/10/2017 17:51:57 Wound Measurements Length: (cm) 0.7 Width: (cm) 0.4 Depth: (cm) 0.1 Area: (cm) 0.22 Volume: (cm) 0.022 % Reduction in Area: 0% % Reduction in Volume: 0% Epithelialization: None Tunneling: No Undermining: No Wound Description Classification:  Category/Stage II Foul Odor Afte Wound Margin: Flat and Intact Slough/Fibrino Exudate Amount: Large Exudate Type: Serous Exudate Color: amber r Cleansing: No No Wound Bed Granulation Amount: Large (67-100%) Granulation Quality: Red Pelto, Reginald L. ([id number]) Necrotic Amount: None Present (0%) Periwound Skin Texture Texture Color No Abnormalities Noted: No No Abnormalities Noted: No Moisture Temperature / Pain No Abnormalities Noted: No Temperature: No Abnormality Maceration: Yes Tenderness on Palpation: Yes Wound Preparation Ulcer Cleansing: Rinsed/Irrigated with Saline Topical Anesthetic Applied: Other: lidocaine 4%, Treatment Notes Wound #12 (Right Trochanter) 1. Cleansed with: Clean wound with Normal Saline 2. Anesthetic Topical Lidocaine 4% cream to wound bed prior to debridement 3. Peri-wound Care: Skin Prep 4. Dressing Applied: Prisma Ag 5. Secondary Dressing Applied Bordered Foam Dressing Dry Gauze Electronic Signature(s) Signed: 02/10/2017 5:54:59 PM By: Alejandro MullingPinkerton, Debra Entered By: Alejandro MullingPinkerton, Debra on 02/10/2017 16:16:14 Caspers, [name] ([id number]) -------------------------------------------------------------------------------- Wound Assessment Details [name], Abbee Date of Service: 02/10/2017 3:30 PM Patient Name: [name] Account Number: [id number] Medical Record Treating RN: Phillis Haggisinkerton, Debi [id number] Number: Other  Clinician: Date of Birth/Sex: 1920-09-29 (83 y.o. Female) Treating [name], [name] Primary Care Amunique Neyra: Antony HasteBADGER, [name] Kendricks Reap/Extender: G Referring Yvette Loveless: Antony HasteBADGER, [name] Weeks in Treatment: 9 Wound Status Wound Number: 13 Primary Pressure Ulcer Etiology: Wound Location: Sacrum Wound Open Wounding Event: Pressure Injury Status: Date Acquired: 03/20/2016 Comorbid Cataracts, Anemia, Hypertension, Weeks Of Treatment: 0 History: History of pressure wounds, Clustered Wound: No Osteoarthritis, Dementia Photos Photo Uploaded By: Alejandro MullingPinkerton, Debra on 02/10/2017 17:52:43 Wound Measurements Length: (cm) 0.7 % Reduction in Width: (cm) 0.5 % Reduction in Depth: (cm) 1 Epithelializati Area: (cm) 0.275 Tunneling: Volume: (cm) 0.275 Undermining: Starting Pos Ending Posit Maximum Dist Area: 0% Volume: 0% on: None No Yes ition (o'clock): 12 ion (o'clock): 12 ance: (cm) 0.4 Wound Description Classification: Category/Stage IV Foul Odor Afte Wound Margin: Distinct, outline attached Slough/Fibrino Exudate Amount: Large Exudate Type: Serous Exudate Color: amber Stauffer, Metzli L. ([id number]) r Cleansing: No No Wound Bed Granulation Amount: Large (67-100%) Granulation Quality: Red Necrotic Amount: None Present (0%) Periwound Skin Texture Texture Color No Abnormalities Noted: No No Abnormalities Noted: No Erythema: Yes Moisture Erythema Location: Circumferential No Abnormalities Noted: No Temperature / Pain Temperature: No Abnormality Wound Preparation Ulcer Cleansing: Rinsed/Irrigated with Saline Topical Anesthetic Applied: Other: lidocaine 4%, Treatment Notes Wound #13 (Sacrum) 1. Cleansed with: Clean wound with Normal Saline 2. Anesthetic Topical Lidocaine 4% cream to wound bed prior to debridement 3. Peri-wound Care: Skin Prep 4. Dressing Applied: Prisma Ag 5. Secondary Dressing Applied Bordered Foam Dressing Dry Gauze Electronic  Signature(s) Signed: 02/10/2017 5:54:59 PM By: Alejandro MullingPinkerton, Debra Entered By: Alejandro MullingPinkerton, Debra on 02/10/2017 16:16:40 Golaszewski, [name] ([id number]) -------------------------------------------------------------------------------- Wound Assessment Details [name], Hoang Date of Service: 02/10/2017 3:30 PM Patient Name: [name] Account Number: [id number] Medical Record Treating RN: Phillis Haggisinkerton, Debi [id number] Number: Other Clinician: Date of Birth/Sex: 1920-09-29 (83 y.o. Female) Treating [name], [name] Primary Care Delray Reza: Antony HasteBADGER, [name] Roi Jafari/Extender: G Referring Judah Chevere: Antony HasteBADGER, [name] Weeks in Treatment: 9 Wound Status Wound Number: 5 Primary Pressure Ulcer Etiology: Wound Location: Right Calcaneus Wound Open Wounding Event: Pressure Injury Status: Date Acquired: 10/09/2016 Comorbid Cataracts, Anemia, Hypertension, Weeks Of Treatment: 9 History: History of pressure wounds, Clustered Wound: Yes Osteoarthritis, Dementia Pending Amputation On Presentation Photos Photo Uploaded By: Alejandro MullingPinkerton, Debra on 02/10/2017 17:52:44 Wound Measurements Length: (cm) 5.7 % Reduction in Width: (cm) 6.3 % Reduction in Depth: (cm) 0.5 Epithelializat Clustered Quantity: 2 Tunneling: Area: (cm) 28.204 Undermining: Volume: (cm) 14.102 Area: 40.1% Volume: -199.3% ion: None No No  Wound Description Classification: Category/Stage III Foul Odor Afte Wound Margin: Distinct, outline attached Due to Product Exudate Amount: Large Slough/Fibrino Exudate Type: Purulent Exudate Color: yellow, brown, green r Cleansing: Yes Use: No Yes Wound Bed Granulation Amount: Small (1-33%) Brislin, Evoleth L. ([id number]) Necrotic Amount: Large (67-100%) Necrotic Quality: Eschar, Adherent Slough Periwound Skin Texture Texture Color No Abnormalities Noted: No No Abnormalities Noted: No Induration: Yes Ecchymosis: No Erythema: Yes Moisture Erythema Location: Circumferential No  Abnormalities Noted: No Maceration: Yes Temperature / Pain Temperature: No Abnormality Tenderness on Palpation: Yes Wound Preparation Ulcer Cleansing: Rinsed/Irrigated with Saline Topical Anesthetic Applied: Other: lidocaine 4%, Treatment Notes Wound #5 (Right Calcaneus) 1. Cleansed with: Clean wound with Normal Saline 2. Anesthetic Topical Lidocaine 4% cream to wound bed prior to debridement 4. Dressing Applied: Iodoflex 5. Secondary Dressing Applied Dry Gauze Foam Kerlix/Conform 7. Secured with Tape Notes heel cup Electronic Signature(s) Signed: 02/10/2017 5:54:59 PM By: [name] Entered By: [name] on 02/10/2017 16:17:57 Esbenshade, [name] ([id number]) -------------------------------------------------------------------------------- Wound Assessment Details [name] Date of Service: 02/10/2017 3:30 PM Patient Name: [name] Account Number: [id number] Medical Record Treating RN: [name] [id number] Number: Other Clinician: Date of Birth/Sex: 03-30-1921 (83 y.o. Female) Treating [name], [name] Primary Care Letishia Elliott: [name] Mart Colpitts/Extender: G Referring Akisha Sturgill: [name] Weeks in Treatment: 9 Wound Status Wound Number: 6 Primary Pressure Ulcer Etiology: Wound Location: Left Calcaneus Wound Open Wounding Event: Pressure Injury Status: Date Acquired: 10/09/2016 Comorbid Cataracts, Anemia, Hypertension, Weeks Of Treatment: 9 History: History of pressure wounds, Clustered Wound: No Osteoarthritis, Dementia Pending Amputation On Presentation Photos Photo Uploaded By: [name] on 02/10/2017 17:53:33 Wound Measurements Length: (cm) 2.3 Width: (cm) 1.5 Depth: (cm) 0.6 Area: (cm) 2.71 Volume: (cm) 1.626 % Reduction in Area: 84% % Reduction in Volume: 3.7% Epithelialization: None Tunneling: No Undermining: No Wound Description Classification: Category/Stage II Foul Odor Aft Wound Margin:  Distinct, outline attached Due to Produc Exudate Amount: Large Slough/Fibrin Exudate Type: Purulent Exudate Color: yellow, brown, green er Cleansing: Yes t Use: No o Yes Wound Bed Granulation Amount: Small (1-33%) Granulation Quality: Red Kindall, Shuntay L. ([id number]) Necrotic Amount: Large (67-100%) Necrotic Quality: Eschar, Adherent Slough Periwound Skin Texture Texture Color No Abnormalities Noted: No No Abnormalities Noted: No Induration: Yes Erythema: Yes Erythema Location: Circumferential Moisture No Abnormalities Noted: No Temperature / Pain Maceration: Yes Temperature: No Abnormality Tenderness on Palpation: Yes Wound Preparation Ulcer Cleansing: Rinsed/Irrigated with Saline Topical Anesthetic Applied: Other: lidocaine 4%, Treatment Notes Wound #6 (Left Calcaneus) 1. Cleansed with: Clean wound with Normal Saline 2. Anesthetic Topical Lidocaine 4% cream to wound bed prior to debridement 4. Dressing Applied: Iodoflex 5. Secondary Dressing Applied Dry Gauze Foam Kerlix/Conform 7. Secured with Tape Notes heel cup Electronic Signature(s) Signed: 02/10/2017 5:54:59 PM By: [name] Entered By: [name] on 02/10/2017 16:17:37 Shankman, [name] -------------------------------------------------------------------------------- Wound Assessment Details [name] Date of Service: 02/10/2017 3:30 PM Patient Name: [name] Account Number: [id number] Medical Record Treating RN: [name] [id number] Number: Other Clinician: Date of Birth/Sex: Sep 11, 1920 (83 y.o. Female) Treating [name], [name] Primary Care Lilias Lorensen: [name] Dorothy Polhemus/Extender: G Referring Christan Ciccarelli: [name] Weeks in Treatment: 9 Wound Status Wound Number: 7 Primary Pressure Ulcer Etiology: Wound Location: Left Malleolus - Lateral Wound Open Wounding Event: Pressure Injury Status: Date Acquired: 10/09/2016 Comorbid Cataracts,  Anemia, Hypertension, Weeks Of Treatment: 9 History: History of pressure wounds, Clustered Wound: No Osteoarthritis, Dementia Pending Amputation On Presentation Photos Photo Uploaded By: [name] on 02/10/2017 17:53:34 Wound Measurements Length: (  cm) 0.4 Width: (cm) 0.5 Depth: (cm) 0.1 Area: (cm) 0.157 Volume: (cm) 0.016 % Reduction in Area: 95.7% % Reduction in Volume: 97.8% Epithelialization: None Tunneling: No Undermining: No Wound Description Classification: Category/Stage II Foul Odor Aft Wound Margin: Distinct, outline attached Due to Produc Exudate Amount: Large Slough/Fibrin Exudate Type: Serosanguineous Exudate Color: red, brown er Cleansing: Yes t Use: No o Yes Wound Bed Granulation Amount: Large (67-100%) Granulation Quality: Red Kimbrell, Taneah L. ([id number]) Necrotic Amount: Small (1-33%) Necrotic Quality: Adherent Slough Periwound Skin Texture Texture Color No Abnormalities Noted: No No Abnormalities Noted: No Erythema: Yes Moisture Erythema Location: Circumferential No Abnormalities Noted: No Temperature / Pain Temperature: No Abnormality Tenderness on Palpation: Yes Wound Preparation Ulcer Cleansing: Rinsed/Irrigated with Saline Topical Anesthetic Applied: Other: lidocaine 4%, Treatment Notes Wound #7 (Left, Lateral Malleolus) 1. Cleansed with: Clean wound with Normal Saline 2. Anesthetic Topical Lidocaine 4% cream to wound bed prior to debridement 3. Peri-wound Care: Skin Prep 4. Dressing Applied: Prisma Ag 5. Secondary Dressing Applied Bordered Foam Dressing Dry Gauze Electronic Signature(s) Signed: 02/10/2017 5:54:59 PM By: [name] Entered By: [name] on 02/10/2017 16:18:18 [place]au, [name] ([id number]) -------------------------------------------------------------------------------- Wound Assessment Details [name] Date of Service: 02/10/2017 3:30 PM Patient Name: [name] Account  Number: [id number] Medical Record Treating RN: [name] [id number] Number: Other Clinician: Date of Birth/Sex: [birth year] (83 y.o. Female) Treating [name], [name] Primary Care Seleni Meller: [name] Ameya Kutz/Extender: G Referring Brayten Komar: [name] Weeks in Treatment: 9 Wound Status Wound Number: 8 Primary Pressure Ulcer Etiology: Wound Location: Left Lower Leg - Posterior Wound Open Wounding Event: Pressure Injury Status: Date Acquired: 10/09/2016 Comorbid Cataracts, Anemia, Hypertension, Weeks Of Treatment: 9 History: History of pressure wounds, Clustered Wound: Yes Osteoarthritis, Dementia Pending Amputation On Presentation Photos Photo Uploaded By: [name] on 02/10/2017 17:54:11 Wound Measurements Length: (cm) 2.4 % Reduction in Width: (cm) 2.5 % Reduction in Depth: (cm) 0.8 Epithelializati Area: (cm) 4.712 Tunneling: Volume: (cm) 3.77 Undermining: Starting Pos Maximum Dist Area: 60.8% Volume: 47.7% on: None No Yes ition (o'clock): 12 ance: (cm) 0.4 Wound Description Classification: Category/Stage IV Foul Odor Afte Wound Margin: Distinct, outline attached Due to Product Exudate Amount: Large Slough/Fibrino Exudate Type: Serosanguineous Exudate Color: red, brown Lamica, Terianna L. ([id number]) r Cleansing: Yes Use: No Yes Wound Bed Granulation Amount: Medium (34-66%) Exposed Structure Granulation Quality: Red Fascia Exposed: No Necrotic Amount: Medium (34-66%) Fat Layer (Subcutaneous Tissue) Exposed: Yes Necrotic Quality: Eschar, Adherent Slough Tendon Exposed: Yes Muscle Exposed: No Joint Exposed: No Bone Exposed: No Periwound Skin Texture Texture Color No Abnormalities Noted: No No Abnormalities Noted: No Erythema: Yes Moisture Erythema Location: Circumferential No Abnormalities Noted: No Temperature / Pain Temperature: No Abnormality Tenderness on Palpation: Yes Wound Preparation Ulcer Cleansing:  Rinsed/Irrigated with Saline Topical Anesthetic Applied: Other: lidocaine 4%, Treatment Notes Wound #8 (Left, Posterior Lower Leg) 1. Cleansed with: Clean wound with Normal Saline 2. Anesthetic Topical Lidocaine 4% cream to wound bed prior to debridement 3. Peri-wound Care: Skin Prep 4. Dressing Applied: Iodoflex 5. Secondary Dressing Applied Bordered Foam Dressing Dry Gauze Electronic Signature(s) Signed: 02/10/2017 5:54:59 PM By: [name] Entered By: [name] on 02/10/2017 16:18:45 Heatherly, [name] -------------------------------------------------------------------------------- Vitals Details [name] Date of Service: 02/10/2017 3:30 PM Patient Name: [name] Account Number: [id number] Medical Record Treating RN: [name] [id number] Number: Other Clinician: Date of Birth/Sex: 03/07/1921 (83 y.o. Female) Treating [name], [name] Primary Care Epifanio Labrador: [name] Oneka Parada/Extender: G Referring Georga Stys: [name] Weeks in Treatment: 9 Vital Signs  Time Taken: 15:38 Temperature (F): 98.0 Height (in): 69 Pulse (bpm): 79 Weight (lbs): 105 Respiratory Rate (breaths/min): 16 Body Mass Index (BMI): 15.5 Blood Pressure (mmHg): 172/69 Reference Range: 80 - 120 mg / dl Notes Made Dr. Leanord Hawking aware of pts BP. Electronic Signature(s) Signed: 02/10/2017 5:54:59 PM By: [name] Entered By: [name] on 02/10/2017 16:20:54

## 2017-02-13 NOTE — Progress Notes (Addendum)
[name], [name] ([id number]) Visit Report for 02/10/2017 Debridement Details [name] Date of Service: 02/10/2017 3:30 PM Patient Name: [name] Account Number: [id number] Medical Record Treating RN: [name] [id number] Number: Other Clinician: 08-01-1920 (83 y.o. Treating [name] Date of Birth/Sex: Female) Provider/Extender: G Primary Care Provider: Antony [name] Referring Provider: Wallene [name] in Treatment: 9 Debridement Performed for Wound #5 Right Calcaneus Assessment: Performed By: Physician [name] Caul, MD Debridement: Debridement Pre-procedure Verification/Time Out Yes - 16:26 Taken: Start Time: 16:27 Pain Control: Lidocaine 4% Topical Solution Level: Skin/Subcutaneous Tissue Total Area Debrided (L x 5.7 (cm) x 6.3 (cm) = 35.91 (cm) W): Tissue and other Viable, Non-Viable, Exudate, Fibrin/Slough, Subcutaneous material debrided: Instrument: Blade, Forceps Bleeding: Minimum Hemostasis Achieved: Pressure End Time: 16:29 Procedural Pain: 0 Post Procedural Pain: 0 Response to Treatment: Procedure was tolerated well Post Debridement Measurements of Total Wound Length: (cm) 5.7 Stage: Category/Stage III Width: (cm) 6.3 Depth: (cm) 0.5 Volume: (cm) 14.102 Character of Wound/Ulcer Post Requires Further Debridement: Debridement Post Procedure Diagnosis Same as Pre-procedure [name], [name] ([id number]) Electronic [name](s) Signed: 02/18/2017 3:51:11 PM By: [name] Najjar MD Signed: 02/18/2017 4:43:23 PM By: [name] [name]: 02/10/2017 6:25:53 PM Version By: [name] Najjar MD [name]: 02/11/2017 4:39:46 PM Version By: [name] [name]: 02/10/2017 5:54:59 PM Version By: [name] Entered By: [name] 14:07:10 [name] -------------------------------------------------------------------------------- HPI  Details [name] Date of Service: 02/10/2017 3:30 PM Patient Name: [name] Account Number: [id number] Medical Record Treating RN: [name] [id number] Number: Other Clinician: 1921-02-01 (83 y.o. Treating [name] Date of Birth/Sex: Female) Provider/Extender: G Primary Care Provider: Antony [name] Referring Provider: Wallene [name] in Treatment: 9 History of Present Illness HPI Description: 03/24/16; this is an elderly 83 year old woman with advanced dementia who was hospitalized from 5/8 through 5/13. At that point she had Proteus sepsis felt to be secondary to a UTI the. Acute kidney injury and delirium. Her son who is her primary caregiver says she came home from the hospital with wounds on her right lower extremity and apparently her right buttock as well. There is no mention on the hospital discharge summary of problems. She has been having [facility] and have been using silver alginate based dressings. Apparently the area on the right buttock is healing and the son stated we did not need to become involved with that. In any case the patient has for wounds to on the right heel and 2 on the posterior lateral right calf, the latter of which is not a usual pressure area however that is the history. She is apparently eating and drinking fairly well. Takes ensure well. She does not have any pressure-relief surface at home. I have reviewed her lab work from the hospital. Her admission albumin was 3.4 on 5/8 04/01/16; patient arrives with wounds looking much the same as last week. She has an extensive pressure area over her right heel and to small but deep wounds on the lateral aspect of her right lower leg. These wounds are not connected. Although there are advertised his pressure ulcers these 2 wounds are not usual for pressure ulcerations. We have not looked at the pressure ulceration on her buttock at the request of the patient's son who is the  primary caregiver 04/08/16; wounds are stable to improved this week. Culture I did of the lower wound on her right lateral leg grew methicillin sensitive staph aureus she is on Septra. We are using silver  alginate to all the wounds. 04/15/16; the 2 small areas on the lateral aspect of this lady's right leg continued to improve however the heel is once again covered with callus, residual alginate, acrotic material all of which requires a difficult debridement. There continues to be purulent material under this surface. This is in spite of a week of Septra that I gave him for MSSA 04/22/16 Patient today presents for follow-up evaluation. She is seen with her son in the office at this point in time she does have Alzheimer's. The good news is her wounds appeared to be somewhat smaller with current therapy. At this point in time the infection which was cultured previously appears to have improved in regard to the right heel wound. There is no purulent drainage at this point in time and a good portion of the wound actually is eschar covered and dry with a medial portion actually open to granulation with very little slough covering. She really has no significant discomfort with palpation manipulation of the wound at this point in time. 04/28/16 patient presents today for follow-up evaluation concerning her right lateral calf wound as well as right heel wound. she has not really been complaining this for as pain is concerned according to her son seen with her in the office at this point in time today. She seems to show no interval signs or symptoms of infection overall has been tolerating the dressing changes well. She does have Alzheimer's therefore is not able to rate her pain although she does respond with an affirmative that she hurts when I press over the Villanueva, [name] ([id number]) heel region. 05/05/16; the area on the lateral aspect of her right calf is healed. The right heel as 2 small spots  that are still open and very close to resolving as well using Aquacel Ag under border foam, 05/20/16; the area on the lateral aspect of her right calf remains closed. The 2 small areas on her right heel are also closed. READMISSION 12/09/16; this is a 83 year old woman with advanced dementia that we cared for in the clinic from September to the beginning of November 2017. At that point she had a right heel, right lateral calf and right buttock wound all felt to be secondary to pressure. These eventually closed over. The patient's son who is the primary caregiver states that over the last 2 months she is developed bilateral lower extremity pressure ulcers. [facility] has [facility] at home and they have been applying Medihoney and an alginate. These were apparently all pressure ulcers today including area on the left posterior calf. Apparently they were keeping her leg elevated on the pillow which contributed to this. The patient has advanced dementia, is nonambulatory and not able to move herself in bed. There is apparently not a nutritional issue. She has [facility]. 12/16/16; patient was readmitted to the clinic last week with multiple wounds including the medial and lateral aspects of the left calcaneus left posterior calf right calcaneus. We've been using Iodoflex, dressing change by home health. Her son also reports that she has had trouble breathing which she attributes to the low air loss mattress we have for pressure relief. He states that when he took her out of bed and put her in her chair breathing improved. As of Monday he took the low air loss mattress off the bed and states that she is able to go to bed without shortness of breath. His description sounded a lot like orthopnea. He is  asking [place] to order a gel mattress overlay. According to her son she does not aspirate at least not frequently 12/23/16; the patient is reviewed today for multiple wounds on her lower extremities.  We have been using Iodoflex dressing change by home health. Last week the patient was Cheyne-Stokes in with an x-ray that look like heart failure with small bilateral pleural effusions. I gave her Lasix and some potassium. She saw her primary physician earlier this week who did not add anything to this. Her son states she is doing better she has not had any of the orthopneic episodes since Monday 01/06/17; 3 week followup. using iodoflex. She has a large wound on the left lateral calf with exposed tendon, and necrotic wound over the left medial heel. Wound over the left lateral malleolus appears to be improved. On the right side necrotic wound on the right medial heel, wound on the right lateral heel looks somewhat better and the area on the left anterior lateral calf looks as though it's progressing towards closure. The patient may have arterial issues however she is too far along in terms of her dementia to undergo testing. Her son claims she is eating well and there offloading this. 01/27/17 on evaluation today patient appears to be doing somewhat worse with new wounds over the right fifth metatarsal region laterally as well as the dorsal right foot. Both of these appear to be deep tissue injuries which have now opened over the two weeks since we last saw her. She also has a mildly tissue injury in the fifth metatarsal region on the left although this does not appear to be worsening and in fact I think wound up being okay. Nonetheless she does have skin in which is loose of the right heel as well as the left posterior calf region. She wears the [employer] when she is in bed but has not been wearing them when she is in her wheelchair. 02/03/17; culture that was done last week of the right dorsal foot grew both methicillin sensitive staph aureus Weismann, Jenascia L. ([id number]) and Pseudomonas. She was initially thought to have Cipro however that up apparently increases QT along with Aricept  was not aware that Aricept did this. This was changed to cefdinir and Septra however the patient is apparently allergic to Septra even though it is not listed in our records. The patient started the cefdinir yesterday. In spite of the delay of antibiotics the area on the right dorsal foot actually looks a lot better than last week with a thick covering eschar and necrotic tissue but no surrounding erythema or drainage. I also took the opportunity today to review her vascular status. When she was in our clinic the first time her ABI on the left was 0.9 and on the right noncompressible. It is still noncompressible today. She has not had imaging studies she has the following wounds including the right dorsal ankle/foot, right heel and right fifth metatarsal head. On the left side she has the medial left heel the left lateral malleolus and the left posterior lateral calf. We have been using Iodoflex on the right and Prisma on the left 02/10/17; in addition to the bilateral foot and calf wounds. It is apparently become clear that the patient actually has a small stage II wound on her coccyx and the right. ishium. They have been apparently therefore many months perhaps total of 8 or 9. Home health is been taking care of these and recently decided that he needed orders.  With regards to the lower extremity wounds the really worrisome areas here are the heel on the right. X-rays I ordered last week showed a possible area of cortical erosion involving the distal lateral fifth metatarsal head question early osteomyelitis. There was no common and on the right heel which was the worrisome area. The left tib-fib showed no osseous abnormality. Left foot showed no acute osseous abnormality she continues to have wounds on the right heel, right anterior foot, right lateral fifth metatarsal head, left medial heel, left lateral heel, left lateral calf as well as the new wounds at least as noted above Electronic  [name](s) Signed: 02/10/2017 6:25:53 PM By: [name] Najjarobson, Faelynn Wynder MD Entered By: [name] Najjarobson, Zylon Creamer on 02/10/2017 18:12:58 [name], [name] SeatsELIZABETH L. ([id number]) -------------------------------------------------------------------------------- Physical Exam Details [name] BabeSUMMERS, [name] Date of Service: 02/10/2017 3:30 PM Patient Name: [name] Account Number: [id number] Medical Record Treating RN: [name] Haggisinkerton, Debi [id number] Number: Other Clinician: 1921-05-05 (83 y.o. Treating Mattalyn Anderegg Date of Birth/Sex: Female) Provider/Extender: G Primary Care Provider: Antony HasteBADGER, Hooper [place]way Referring Provider: Antony HasteBADGER, Dhanush Jokerst Weeks in Treatment: 9 Constitutional Sitting or standing Blood Pressure is within target range for patient.. Pulse regular and within target range for patient.Marland Kitchen. Respirations regular, non-labored and within target range.. Temperature is normal and within the target range for the patient.Marland Kitchen. appears in no distress however very frail with advanced dementia.. Cardiovascular She is very vibrant popliteal pulses. Pedal pulses absent bilaterally.. Lymphatic . Psychiatric Advanced dementia. Notes Wound exam; #1 I am most concerned about the condition of the right foot. There are 3 wounds here a large wound over the right posterior heel with a thick separating eschar and nonviable subcutaneous tissue. Right lateral fifth metatarsal head and right anterior foot. This has the appearance of ischemic wounds or at least wounds that are being maintained by a lack of blood flow. Using pickups and scalpel I removed the eschar from the heel as it was already separating. #2 the areas on the left actually looks some better left medial heel left lateral heel and left lateral calf. #3 she has new wounds at least to us small stage II wound on her sacrum and right pelvis. These are apparently not new however and may have been there as long as last September Electronic [name](s) Signed: 02/10/2017 6:25:53 PM  By: [name] Najjarobson, Amin Fornwalt MD Entered By: [name] Najjarobson, Wilberth Damon on 02/10/2017 18:16:57 [name], [name] SeatsELIZABETH L. ([id number]) -------------------------------------------------------------------------------- Physician Orders Details [name] BabeSUMMERS, Kayslee Date of Service: 02/10/2017 3:30 PM Patient Name: [name] Account Number: [id number] Medical Record Treating RN: [name] Haggisinkerton, Debi [id number] Number: Other Clinician: 1921-05-05 (83 y.o. Treating Janie Capp Date of Birth/Sex: Female) Provider/Extender: G Primary Care Provider: Antony HasteBADGER, Mounir Skipper Referring Provider: Wallene DalesBADGER, Joseline Mccampbell Weeks in Treatment: 9 Verbal / Phone Orders: Yes Clinician: Ashok CordiaPinkerton, Debi Read Back and Verified: Yes Diagnosis Coding Wound Cleansing Wound #10 Right,Dorsal Foot o Clean wound with Normal Saline. o Cleanse wound with mild soap and water Wound #12 Right Trochanter o Clean wound with Normal Saline. o Cleanse wound with mild soap and water Wound #13 Sacrum o Clean wound with Normal Saline. o Cleanse wound with mild soap and water Wound #11 Right,Plantar Metatarsal head fifth o Clean wound with Normal Saline. o Cleanse wound with mild soap and water Wound #5 Right Calcaneus o Clean wound with Normal Saline. o Cleanse wound with mild soap and water Wound #6 Left Calcaneus o Clean wound with Normal Saline. o Cleanse wound with mild soap and water Wound #7 Left,Lateral Malleolus o Clean wound with Normal Saline. o Cleanse wound with mild  soap and water Wound #8 Left,Posterior Lower Leg o Clean wound with Normal Saline. o Cleanse wound with mild soap and water Anesthetic Wound #10 Right,Dorsal Foot [name], [name] ([id number]) o Topical Lidocaine 4% cream applied to wound bed prior to debridement - for clinic use Wound #12 Right Trochanter o Topical Lidocaine 4% cream applied to wound bed prior to debridement - for clinic use Wound #13 Sacrum o Topical Lidocaine 4% cream  applied to wound bed prior to debridement - for clinic use Wound #11 Right,Plantar Metatarsal head fifth o Topical Lidocaine 4% cream applied to wound bed prior to debridement - for clinic use Wound #5 Right Calcaneus o Topical Lidocaine 4% cream applied to wound bed prior to debridement - for clinic use Wound #6 Left Calcaneus o Topical Lidocaine 4% cream applied to wound bed prior to debridement - for clinic use Wound #7 Left,Lateral Malleolus o Topical Lidocaine 4% cream applied to wound bed prior to debridement - for clinic use Wound #8 Left,Posterior Lower Leg o Topical Lidocaine 4% cream applied to wound bed prior to debridement - for clinic use Skin Barriers/Peri-Wound Care Wound #10 Right,Dorsal Foot o Skin Prep Wound #12 Right Trochanter o Skin Prep Wound #13 Sacrum o Skin Prep Wound #11 Right,Plantar Metatarsal head fifth o Skin Prep Wound #7 Left,Lateral Malleolus o Skin Prep Wound #8 Left,Posterior Lower Leg o Skin Prep Primary Wound Dressing Wound #10 Right,Dorsal Foot o Iodoflex Wound #11 Right,Plantar Metatarsal head fifth o Iodoflex Blinder, Bettey L. ([id number]) Wound #5 Right Calcaneus o Iodoflex Wound #6 Left Calcaneus o Iodoflex Wound #12 Right Trochanter o Prisma Ag - moisten with saline or hydrogel Wound #13 Sacrum o Prisma Ag - moisten with saline or hydrogel Wound #7 Left,Lateral Malleolus o Prisma Ag - moisten with saline or hydrogel Wound #8 Left,Posterior Lower Leg o Iodoflex Secondary Dressing Wound #10 Right,Dorsal Foot o Dry Gauze o Boardered Foam Dressing Wound #11 Right,Plantar Metatarsal head fifth o Dry Gauze o Boardered Foam Dressing Wound #12 Right Trochanter o Dry Gauze o Boardered Foam Dressing Wound #13 Sacrum o Dry Gauze o Boardered Foam Dressing Wound #5 Right Calcaneus o Dry Gauze o Conform/Kerlix o Other - allevyn heel cup, stretch netting #4 Wound #6  Left Calcaneus o Dry Gauze o Conform/Kerlix o Other - allevyn heel cup, stretch netting #4 Wound #7 Left,Lateral Malleolus o Dry Gauze o Boardered Foam Dressing Kady, Roanne L. ([id number]) Wound #8 Left,Posterior Lower Leg o Dry Gauze o Boardered Foam Dressing Dressing Change Frequency Wound #10 Right,Dorsal Foot o Change dressing every other day. Wound #12 Right Trochanter o Change dressing every other day. Wound #13 Sacrum o Change dressing every other day. Wound #11 Right,Plantar Metatarsal head fifth o Change dressing every other day. Wound #5 Right Calcaneus o Change dressing every other day. Wound #6 Left Calcaneus o Change dressing every other day. Wound #7 Left,Lateral Malleolus o Change dressing every other day. Wound #8 Left,Posterior Lower Leg o Change dressing every other day. Follow-up Appointments Wound #10 Right,Dorsal Foot o Return Appointment in 1 week. Wound #12 Right Trochanter o Return Appointment in 1 week. Wound #13 Sacrum o Return Appointment in 1 week. Wound #11 Right,Plantar Metatarsal head fifth o Return Appointment in 1 week. Wound #5 Right Calcaneus o Return Appointment in 1 week. Wound #6 Left Calcaneus o Return Appointment in 1 week. [name], [name] ([id number]) Wound #7 Left,Lateral Malleolus o Return Appointment in 1 week. Wound #8 Left,Posterior Lower Leg o Return Appointment in 1 week. Edema  Control Wound #10 Right,Dorsal Foot o Elevate legs to the level of the heart and pump ankles as often as possible Wound #11 Right,Plantar Metatarsal head fifth o Elevate legs to the level of the heart and pump ankles as often as possible Wound #5 Right Calcaneus o Elevate legs to the level of the heart and pump ankles as often as possible Wound #6 Left Calcaneus o Elevate legs to the level of the heart and pump ankles as often as possible Wound #7 Left,Lateral Malleolus o  Elevate legs to the level of the heart and pump ankles as often as possible Wound #8 Left,Posterior Lower Leg o Elevate legs to the level of the heart and pump ankles as often as possible Off-Loading Wound #10 Right,Dorsal Foot o Turn and reposition every 2 hours o Other: - wear sage boots Wound #12 Right Trochanter o Turn and reposition every 2 hours o Other: - wear sage boots Wound #13 Sacrum o Turn and reposition every 2 hours o Other: - wear sage boots Wound #11 Right,Plantar Metatarsal head fifth o Turn and reposition every 2 hours o Other: - wear sage boots Wound #5 Right Calcaneus o Turn and reposition every 2 hours o Other: - wear sage boots Wound #6 Left Calcaneus o Turn and reposition every 2 hours [name], [name] ([id number]) o Other: - wear sage boots Wound #7 Left,Lateral Malleolus o Turn and reposition every 2 hours o Other: - wear sage boots Wound #8 Left,Posterior Lower Leg o Turn and reposition every 2 hours o Other: - wear sage boots Additional Orders / Instructions Wound #10 Right,Dorsal Foot o Increase protein intake. o Other: - left 5th plantar metatarsal head paint with betadine every day (deep tissue injury) place foam to the top of the left foot for padded protection, add bordered foam dressing to midline back to help with pressure relief Wound #12 Right Trochanter o Increase protein intake. o Other: - left 5th plantar metatarsal head paint with betadine every day (deep tissue injury) place foam to the top of the left foot for padded protection, add bordered foam dressing to midline back to help with pressure relief Wound #13 Sacrum o Increase protein intake. o Other: - left 5th plantar metatarsal head paint with betadine every day (deep tissue injury) place foam to the top of the left foot for padded protection, add bordered foam dressing to midline back to help with pressure relief Wound #11  Right,Plantar Metatarsal head fifth o Increase protein intake. o Other: - left 5th plantar metatarsal head paint with betadine every day (deep tissue injury) place foam to the top of the left foot for padded protection, add bordered foam dressing to midline back to help with pressure relief Wound #5 Right Calcaneus o Increase protein intake. o Other: - left 5th plantar metatarsal head paint with betadine every day (deep tissue injury) place foam to the top of the left foot for padded protection, add bordered foam dressing to midline back to help with pressure relief Wound #6 Left Calcaneus o Increase protein intake. o Other: - left 5th plantar metatarsal head paint with betadine every day (deep tissue injury) place foam to the top of the left foot for padded protection, add bordered foam dressing to midline back to help with pressure relief Wound #7 Left,Lateral Malleolus o Increase protein intake. [name], [name] ([id number]) o Other: - left 5th plantar metatarsal head paint with betadine every day (deep tissue injury) place foam to the top of the left foot for  padded protection, add bordered foam dressing to midline back to help with pressure relief Wound #8 Left,Posterior Lower Leg o Increase protein intake. o Other: - left 5th plantar metatarsal head paint with betadine every day (deep tissue injury) place foam to the top of the left foot for padded protection, add bordered foam dressing to midline back to help with pressure relief Home Health Wound #10 Right,Dorsal Foot o Continue [facility] Visits - [facility] o Home Health Nurse may visit PRN to address patientos wound care needs. o FACE TO FACE ENCOUNTER: MEDICARE and MEDICAID PATIENTS: I certify that this patient is under my care and that I had a face-to-face encounter that meets the physician face-to-face encounter requirements with this patient on this date. The encounter with the  patient was in whole or in part for the following MEDICAL CONDITION: (primary reason for Home Healthcare) MEDICAL NECESSITY: I certify, that based on my findings, NURSING services are a medically necessary home health service. HOME BOUND STATUS: I certify that my clinical findings support that this patient is homebound (i.e., Due to illness or injury, pt requires aid of supportive devices such as crutches, cane, wheelchairs, walkers, the use of special transportation or the assistance of another person to leave their place of residence. There is a normal inability to leave the home and doing so requires considerable and taxing effort. Other absences are for medical reasons / religious services and are infrequent or of short duration when for other reasons). o If current dressing causes regression in wound condition, may D/C ordered dressing product/s and apply Normal Saline Moist Dressing daily until next Wound Healing Center / Other MD appointment. Notify Wound Healing Center of regression in wound condition at [phone]. o Please direct any NON-WOUND related issues/requests for orders to patient's Primary Care Physician Wound #12 Right Trochanter o [facility] o Home Health Nurse may visit PRN to address patientos wound care needs. o FACE TO FACE ENCOUNTER: MEDICARE and MEDICAID PATIENTS: I certify that this patient is under my care and that I had a face-to-face encounter that meets the physician face-to-face encounter requirements with this patient on this date. The encounter with the patient was in whole or in part for the following MEDICAL CONDITION: (primary reason for Home Healthcare) MEDICAL NECESSITY: I certify, that based on my findings, NURSING services are a medically necessary home health service. HOME BOUND STATUS: I certify that my clinical findings support that this patient is homebound (i.e., Due to illness or injury, pt requires  aid of supportive devices such as crutches, cane, wheelchairs, walkers, the use of special transportation or the assistance of another person to leave their place of residence. There is a normal inability to leave the home and doing so requires considerable and taxing effort. Other absences are for medical reasons / religious services and are infrequent or of short duration when for other reasons). o If current dressing causes regression in wound condition, may D/C ordered dressing product/s and apply Normal Saline Moist Dressing daily until next Wound Healing Center / Other MD appointment. Notify Wound Healing Center of regression in wound condition at [phone]. CHASTY, RANDAL ([id number]) o Please direct any NON-WOUND related issues/requests for orders to patient's Primary Care Physician Wound #13 Sacrum o [facility] - [facility] o Home Health Nurse may visit PRN to address patientos wound care needs. o FACE TO FACE ENCOUNTER: MEDICARE and MEDICAID PATIENTS: I certify that this patient  is under my care and that I had a face-to-face encounter that meets the physician face-to-face encounter requirements with this patient on this date. The encounter with the patient was in whole or in part for the following MEDICAL CONDITION: (primary reason for Home Healthcare) MEDICAL NECESSITY: I certify, that based on my findings, NURSING services are a medically necessary home health service. HOME BOUND STATUS: I certify that my clinical findings support that this patient is homebound (i.e., Due to illness or injury, pt requires aid of supportive devices such as crutches, cane, wheelchairs, walkers, the use of special transportation or the assistance of another person to leave their place of residence. There is a normal inability to leave the home and doing so requires considerable and taxing effort. Other absences are for medical reasons / religious services and  are infrequent or of short duration when for other reasons). o If current dressing causes regression in wound condition, may D/C ordered dressing product/s and apply Normal Saline Moist Dressing daily until next Wound Healing Center / Other MD appointment. Notify Wound Healing Center of regression in wound condition at [phone]. o Please direct any NON-WOUND related issues/requests for orders to patient's Primary Care Physician Wound #11 Right,Plantar Metatarsal head fifth o [facility] - [facility] o Home Health Nurse may visit PRN to address patientos wound care needs. o FACE TO FACE ENCOUNTER: MEDICARE and MEDICAID PATIENTS: I certify that this patient is under my care and that I had a face-to-face encounter that meets the physician face-to-face encounter requirements with this patient on this date. The encounter with the patient was in whole or in part for the following MEDICAL CONDITION: (primary reason for Home Healthcare) MEDICAL NECESSITY: I certify, that based on my findings, NURSING services are a medically necessary home health service. HOME BOUND STATUS: I certify that my clinical findings support that this patient is homebound (i.e., Due to illness or injury, pt requires aid of supportive devices such as crutches, cane, wheelchairs, walkers, the use of special transportation or the assistance of another person to leave their place of residence. There is a normal inability to leave the home and doing so requires considerable and taxing effort. Other absences are for medical reasons / religious services and are infrequent or of short duration when for other reasons). o If current dressing causes regression in wound condition, may D/C ordered dressing product/s and apply Normal Saline Moist Dressing daily until next Wound Healing Center / Other MD appointment. Notify Wound Healing Center of regression in wound condition at [phone]. o  Please direct any NON-WOUND related issues/requests for orders to patient's Primary Care Physician Wound #5 Right Calcaneus o [facility] - [facility] o Home Health Nurse may visit PRN to address patientos wound care needs. o FACE TO FACE ENCOUNTER: MEDICARE and MEDICAID PATIENTS: I certify that this patient is under my care and that I had a face-to-face encounter that meets the physician face-to-face [name], [name] ([id number]) encounter requirements with this patient on this date. The encounter with the patient was in whole or in part for the following MEDICAL CONDITION: (primary reason for Home Healthcare) MEDICAL NECESSITY: I certify, that based on my findings, NURSING services are a medically necessary home health service. HOME BOUND STATUS: I certify that my clinical findings support that this patient is homebound (i.e., Due to illness or injury, pt requires aid of supportive devices such as crutches, cane, wheelchairs, walkers, the use of special transportation or  the assistance of another person to leave their place of residence. There is a normal inability to leave the home and doing so requires considerable and taxing effort. Other absences are for medical reasons / religious services and are infrequent or of short duration when for other reasons). o If current dressing causes regression in wound condition, may D/C ordered dressing product/s and apply Normal Saline Moist Dressing daily until next Wound Healing Center / Other MD appointment. Notify Wound Healing Center of regression in wound condition at [phone]. o Please direct any NON-WOUND related issues/requests for orders to patient's Primary Care Physician Wound #6 Left Calcaneus o [facility] - [facility] o Home Health Nurse may visit PRN to address patientos wound care needs. o FACE TO FACE ENCOUNTER: MEDICARE and MEDICAID PATIENTS: I certify that this  patient is under my care and that I had a face-to-face encounter that meets the physician face-to-face encounter requirements with this patient on this date. The encounter with the patient was in whole or in part for the following MEDICAL CONDITION: (primary reason for Home Healthcare) MEDICAL NECESSITY: I certify, that based on my findings, NURSING services are a medically necessary home health service. HOME BOUND STATUS: I certify that my clinical findings support that this patient is homebound (i.e., Due to illness or injury, pt requires aid of supportive devices such as crutches, cane, wheelchairs, walkers, the use of special transportation or the assistance of another person to leave their place of residence. There is a normal inability to leave the home and doing so requires considerable and taxing effort. Other absences are for medical reasons / religious services and are infrequent or of short duration when for other reasons). o If current dressing causes regression in wound condition, may D/C ordered dressing product/s and apply Normal Saline Moist Dressing daily until next Wound Healing Center / Other MD appointment. Notify Wound Healing Center of regression in wound condition at [phone]. o Please direct any NON-WOUND related issues/requests for orders to patient's Primary Care Physician Wound #7 Left,Lateral Malleolus o [facility] - [facility] o Home Health Nurse may visit PRN to address patientos wound care needs. o FACE TO FACE ENCOUNTER: MEDICARE and MEDICAID PATIENTS: I certify that this patient is under my care and that I had a face-to-face encounter that meets the physician face-to-face encounter requirements with this patient on this date. The encounter with the patient was in whole or in part for the following MEDICAL CONDITION: (primary reason for Home Healthcare) MEDICAL NECESSITY: I certify, that based on my findings, NURSING services  are a medically necessary home health service. HOME BOUND STATUS: I certify that my clinical findings support that this patient is homebound (i.e., Due to illness or injury, pt requires aid of supportive devices such as crutches, cane, wheelchairs, walkers, the use of special transportation or the assistance of another person to leave their place of residence. There is a normal inability to leave the home and doing so requires considerable and taxing effort. Other [name], [name] ([id number]) absences are for medical reasons / religious services and are infrequent or of short duration when for other reasons). o If current dressing causes regression in wound condition, may D/C ordered dressing product/s and apply Normal Saline Moist Dressing daily until next Wound Healing Center / Other MD appointment. Notify Wound Healing Center of regression in wound condition at [phone]. o Please direct any NON-WOUND related issues/requests for orders to patient's Primary Care Physician Wound #  8 Left,Posterior Lower Leg o [facility] - [facility] o Home Health Nurse may visit PRN to address patientos wound care needs. o FACE TO FACE ENCOUNTER: MEDICARE and MEDICAID PATIENTS: I certify that this patient is under my care and that I had a face-to-face encounter that meets the physician face-to-face encounter requirements with this patient on this date. The encounter with the patient was in whole or in part for the following MEDICAL CONDITION: (primary reason for Home Healthcare) MEDICAL NECESSITY: I certify, that based on my findings, NURSING services are a medically necessary home health service. HOME BOUND STATUS: I certify that my clinical findings support that this patient is homebound (i.e., Due to illness or injury, pt requires aid of supportive devices such as crutches, cane, wheelchairs, walkers, the use of special transportation or the assistance of another  person to leave their place of residence. There is a normal inability to leave the home and doing so requires considerable and taxing effort. Other absences are for medical reasons / religious services and are infrequent or of short duration when for other reasons). o If current dressing causes regression in wound condition, may D/C ordered dressing product/s and apply Normal Saline Moist Dressing daily until next Wound Healing Center / Other MD appointment. Notify Wound Healing Center of regression in wound condition at [phone]. o Please direct any NON-WOUND related issues/requests for orders to patient's Primary Care Physician Electronic [name](s) Signed: 02/10/2017 5:54:59 PM By: [name] Signed: 02/10/2017 6:25:53 PM By: [name] Najjar MD Entered By: [name] on 02/10/2017 16:34:00 [name], [name] ([id number]) -------------------------------------------------------------------------------- Problem List Details [name] Date of Service: 02/10/2017 3:30 PM Patient Name: [name] Account Number: [id number] Medical Record Treating RN: [name] [id number] Number: Other Clinician: [birth year] (83 y.o. 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 Account Number: [id number] Medical Record Treating RN: [name] [id number] Number: Other Clinician: [birth year] (83 y.o. Treating Gordy Goar Date of Birth/Sex: Female) Provider/Extender: G Primary Care Provider: Antony [name] Referring Provider: Antony [name] Weeks in Treatment: 9 Subjective History of Present Illness (HPI) 03/24/16; this is an elderly 83 year old woman with advanced dementia who was hospitalized from 5/8 through 5/13. At that point she had Proteus sepsis felt to be secondary to a UTI the. Acute kidney injury and delirium. Her son who is her primary caregiver says she came home from the hospital with wounds on her right lower extremity and apparently her right buttock as well. There is no mention on the hospital discharge summary of problems. She has been having [facility] and have been using silver alginate based dressings. Apparently the area on the right buttock is healing and the  son stated we did not need to become involved with that. In any case the patient has for wounds to on the right heel and 2 on the posterior lateral right calf, the latter of which is not a usual pressure area however that is the history. She is apparently eating and drinking fairly well. Takes ensure well. She does not have any pressure-relief surface at home. I have reviewed her lab work from the hospital. Her admission albumin was 3.4 on 5/8 04/01/16; patient arrives with wounds looking much the same as last week. She has an extensive pressure area over her right heel and to small but deep wounds on the  lateral aspect of her right lower leg. These wounds are not connected. Although there are advertised his pressure ulcers these 2 wounds are not usual for pressure ulcerations. We have not looked at the pressure ulceration on her buttock at the request of the patient's son who is the primary caregiver 04/08/16; wounds are stable to improved this week. Culture I did of the lower wound on her right lateral leg grew methicillin sensitive staph aureus she is on Septra. We are using silver alginate to all the wounds. 04/15/16; the 2 small areas on the lateral aspect of this lady's right leg continued to improve however the heel is once again covered with callus, residual alginate, acrotic material all of which requires a difficult debridement. There continues to be purulent material under this surface. This is in spite of a week of Septra that I gave him for MSSA 04/22/16 Patient today presents for follow-up evaluation. She is seen with her son in the office at this point in time she does have Alzheimer's. The good news is her wounds appeared to be somewhat smaller with current therapy. At this point in time the infection which was cultured previously appears to have improved in regard to the right heel wound. There is no purulent drainage at this point in time and a good portion of the wound actually is eschar covered and dry with a medial portion actually open to granulation with very little slough covering. She really has no significant discomfort with palpation manipulation of the wound at this point in time. 04/28/16 patient presents today for follow-up evaluation concerning her right lateral calf wound as well as right heel wound. she has not really been complaining this for as pain is concerned according to her son seen with her in the office at this point in time today. She seems to show no interval signs or symptoms of infection overall has been tolerating the dressing changes well. She does have  Alzheimer's therefore is not Gero, Elanna L. ([id number]) able to rate her pain although she does respond with an affirmative that she hurts when I press over the heel region. 05/05/16; the area on the lateral aspect of her right calf is healed. The right heel as 2 small spots that are still open and very close to resolving as well using Aquacel Ag under border foam, 05/20/16; the area on the lateral aspect of her right calf remains closed. The 2 small areas on her right heel are also closed. READMISSION 12/09/16; this is a 83 year old woman with advanced dementia that we cared for in the clinic from September to the beginning of November 2017. At that point she had a right heel, right lateral calf and right buttock wound all felt to be secondary to pressure. These eventually closed over. The patient's son who is the primary caregiver states that  over the last 2 months she is developed bilateral lower extremity pressure ulcers. [facility] and they have been applying Medihoney and an alginate. These were apparently all pressure ulcers today including area on the left posterior calf. Apparently they were keeping her leg elevated on the pillow which contributed to this. The patient has advanced dementia, is nonambulatory and not able to move herself in bed. There is apparently not a nutritional issue. [facility] has [facility]. 12/16/16; patient was readmitted to the clinic last week with multiple wounds including the medial and lateral aspects of the left calcaneus left posterior calf right calcaneus. We've been using Iodoflex, dressing change by home health. Her son also reports that she has had trouble breathing which she attributes to the low air loss mattress we have for pressure relief. He states that when he took her out of bed and put her in her chair breathing improved. As of Monday he took the low air loss mattress off the bed and states that she is able to  go to bed without shortness of breath. His description sounded a lot like orthopnea. He is asking [place] to order a gel mattress overlay. According to her son she does not aspirate at least not frequently 12/23/16; the patient is reviewed today for multiple wounds on her lower extremities. We have been using Iodoflex dressing change by home health. Last week the patient was Cheyne-Stokes in with an x-ray that look like heart failure with small bilateral pleural effusions. I gave her Lasix and some potassium. She saw her primary physician earlier this week who did not add anything to this. Her son states she is doing better she has not had any of the orthopneic episodes since Monday 01/06/17; 3 week followup. using iodoflex. She has a large wound on the left lateral calf with exposed tendon, and necrotic wound over the left medial heel. Wound over the left lateral malleolus appears to be improved. On the right side necrotic wound on the right medial heel, wound on the right lateral heel looks somewhat better and the area on the left anterior lateral calf looks as though it's progressing towards closure. The patient may have arterial issues however she is too far along in terms of her dementia to undergo testing. Her son claims she is eating well and there offloading this. 01/27/17 on evaluation today patient appears to be doing somewhat worse with new wounds over the right fifth metatarsal region laterally as well as the dorsal right foot. Both of these appear to be deep tissue injuries which have now opened over the two weeks since we last saw her. She also has a mildly tissue injury in the fifth metatarsal region on the left although this does not appear to be worsening and in fact I think wound up being okay. Nonetheless she does have skin in which is loose of the right heel as well as the left posterior calf region. She wears the [employer] when she is in bed but has not been wearing them when  she is in her wheelchair. KRIMSON, MASSMANN ([id number]) 02/03/17; culture that was done last week of the right dorsal foot grew both methicillin sensitive staph aureus and Pseudomonas. She was initially thought to have Cipro however that up apparently increases QT along with Aricept was not aware that Aricept did this. This was changed to cefdinir and Septra however the patient is apparently allergic to Septra even though it is not listed  in our records. The patient started the cefdinir yesterday. In spite of the delay of antibiotics the area on the right dorsal foot actually looks a lot better than last week with a thick covering eschar and necrotic tissue but no surrounding erythema or drainage. I also took the opportunity today to review her vascular status. When she was in our clinic the first time her ABI on the left was 0.9 and on the right noncompressible. It is still noncompressible today. She has not had imaging studies she has the following wounds including the right dorsal ankle/foot, right heel and right fifth metatarsal head. On the left side she has the medial left heel the left lateral malleolus and the left posterior lateral calf. We have been using Iodoflex on the right and Prisma on the left 02/10/17; in addition to the bilateral foot and calf wounds. It is apparently become clear that the patient actually has a small stage II wound on her coccyx and the right. ishium. They have been apparently therefore many months perhaps total of 8 or 9. Home health is been taking care of these and recently decided that he needed orders. With regards to the lower extremity wounds the really worrisome areas here are the heel on the right. X-rays I ordered last week showed a possible area of cortical erosion involving the distal lateral fifth metatarsal head question early osteomyelitis. There was no common and on the right heel which was the worrisome area. The left tib-fib showed no  osseous abnormality. Left foot showed no acute osseous abnormality she continues to have wounds on the right heel, right anterior foot, right lateral fifth metatarsal head, left medial heel, left lateral heel, left lateral calf as well as the new wounds at least as noted above Objective Constitutional Sitting or standing Blood Pressure is within target range for patient.. Pulse regular and within target range for patient.Marland Kitchen Respirations regular, non-labored and within target range.. Temperature is normal and within the target range for the patient.Marland Kitchen appears in no distress however very frail with advanced dementia.. Vitals Time Taken: 3:38 PM, Height: 69 in, Weight: 105 lbs, BMI: 15.5, Temperature: 98.0 F, Pulse: 79 bpm, Respiratory Rate: 16 breaths/min, Blood Pressure: 172/69 mmHg. General Notes: Made Dr. Leanord Hawking aware of pts BP. RONAE, NOELL ([id number]) Cardiovascular She is very vibrant popliteal pulses. Pedal pulses absent bilaterally.Marland Kitchen Psychiatric Advanced dementia. General Notes: Wound exam; #1 I am most concerned about the condition of the right foot. There are 3 wounds here a large wound over the right posterior heel with a thick separating eschar and nonviable subcutaneous tissue. Right lateral fifth metatarsal head and right anterior foot. This has the appearance of ischemic wounds or at least wounds that are being maintained by a lack of blood flow. Using pickups and scalpel I removed the eschar from the heel as it was already separating. #2 the areas on the left actually looks some better left medial heel left lateral heel and left lateral calf. #3 she has new wounds at least to [place] small stage II wound on her sacrum and right pelvis. These are apparently not new however and may have been there as long as last September Integumentary (Hair, Skin) Wound #10 status is Open. Original cause of wound was Pressure Injury. The wound is located on the Right,Dorsal Foot. The  wound measures 2.2cm length x 1.9cm width x 0.1cm depth; 3.283cm^2 area and 0.328cm^3 volume. There is no tunneling or undermining noted. There is a large amount of serosanguineous  drainage noted. The wound margin is distinct with the outline attached to the wound base. There is small (1-33%) granulation within the wound bed. There is a large (67-100%) amount of necrotic tissue within the wound bed including Eschar and Adherent Slough. The periwound skin appearance exhibited: Induration, Hemosiderin Staining, Rubor, Erythema. The periwound skin appearance did not exhibit: Maceration. The surrounding wound skin color is noted with erythema which is circumferential. Periwound temperature was noted as No Abnormality. The periwound has tenderness on palpation. Wound #11 status is Open. Original cause of wound was Pressure Injury. The wound is located on the Right,Plantar Metatarsal head fifth. The wound measures 2cm length x 0.8cm width x 0.3cm depth; 1.257cm^2 area and 0.377cm^3 volume. There is no tunneling or undermining noted. There is a large amount of serosanguineous drainage noted. The wound margin is distinct with the outline attached to the wound base. There is no granulation within the wound bed. There is a large (67-100%) amount of necrotic tissue within the wound bed including Eschar. The periwound skin appearance exhibited: Rubor, Erythema. The surrounding wound skin color is noted with erythema which is circumferential. The periwound has tenderness on palpation. Wound #12 status is Open. Original cause of wound was Pressure Injury. The wound is located on the Right Trochanter. The wound measures 0.7cm length x 0.4cm width x 0.1cm depth; 0.22cm^2 area and 0.022cm^3 volume. There is no tunneling or undermining noted. There is a large amount of serous drainage noted. The wound margin is flat and intact. There is large (67-100%) red granulation within the wound bed. There is no necrotic  tissue within the wound bed. The periwound skin appearance exhibited: Maceration. Periwound temperature was noted as No Abnormality. The periwound has tenderness on palpation. Wound #13 status is Open. Original cause of wound was Pressure Injury. The wound is located on the Sacrum. The wound measures 0.7cm length x 0.5cm width x 1cm depth; 0.275cm^2 area and 0.275cm^3 volume. There is no tunneling noted, however, there is undermining starting at 12:00 and ending at 12:00 with a maximum distance of 0.4cm. There is a large amount of serous drainage noted. The wound margin is distinct with the outline attached to the wound base. There is large (67-100%) red granulation within the wound bed. There is no necrotic tissue within the wound bed. The periwound skin appearance exhibited: Erythema. The surrounding wound skin color is noted with erythema which is circumferential. Periwound temperature was noted as No Abnormality. ALAYJA, ARMAS ([id number]) Wound #5 status is Open. Original cause of wound was Pressure Injury. The wound is located on the Right Calcaneus. The wound measures 5.7cm length x 6.3cm width x 0.5cm depth; 28.204cm^2 area and 14.102cm^3 volume. There is no tunneling or undermining noted. There is a large amount of purulent drainage noted. The wound margin is distinct with the outline attached to the wound base. There is small (1-33%) granulation within the wound bed. There is a large (67-100%) amount of necrotic tissue within the wound bed including Eschar and Adherent Slough. The periwound skin appearance exhibited: Induration, Maceration, Erythema. The periwound skin appearance did not exhibit: Ecchymosis. The surrounding wound skin color is noted with erythema which is circumferential. Periwound temperature was noted as No Abnormality. The periwound has tenderness on palpation. Wound #6 status is Open. Original cause of wound was Pressure Injury. The wound is located on the  Left Calcaneus. The wound measures 2.3cm length x 1.5cm width x 0.6cm depth; 2.71cm^2 area and 1.626cm^3 volume. There is no tunneling or  undermining noted. There is a large amount of purulent drainage noted. The wound margin is distinct with the outline attached to the wound base. There is small (1-33%) red granulation within the wound bed. There is a large (67-100%) amount of necrotic tissue within the wound bed including Eschar and Adherent Slough. The periwound skin appearance exhibited: Induration, Maceration, Erythema. The surrounding wound skin color is noted with erythema which is circumferential. Periwound temperature was noted as No Abnormality. The periwound has tenderness on palpation. Wound #7 status is Open. Original cause of wound was Pressure Injury. The wound is located on the Left,Lateral Malleolus. The wound measures 0.4cm length x 0.5cm width x 0.1cm depth; 0.157cm^2 area and 0.016cm^3 volume. There is no tunneling or undermining noted. There is a large amount of serosanguineous drainage noted. The wound margin is distinct with the outline attached to the wound base. There is large (67-100%) red granulation within the wound bed. There is a small (1-33%) amount of necrotic tissue within the wound bed including Adherent Slough. The periwound skin appearance exhibited: Erythema. The surrounding wound skin color is noted with erythema which is circumferential. Periwound temperature was noted as No Abnormality. The periwound has tenderness on palpation. Wound #8 status is Open. Original cause of wound was Pressure Injury. The wound is located on the Left,Posterior Lower Leg. The wound measures 2.4cm length x 2.5cm width x 0.8cm depth; 4.712cm^2 area and 3.77cm^3 volume. There is tendon and Fat Layer (Subcutaneous Tissue) Exposed exposed. There is no tunneling noted, however, there is undermining starting at 12:00 and ending at :00 with a maximum distance of 0.4cm. There is a large  amount of serosanguineous drainage noted. The wound margin is distinct with the outline attached to the wound base. There is medium (34-66%) red granulation within the wound bed. There is a medium (34-66%) amount of necrotic tissue within the wound bed including Eschar and Adherent Slough. The periwound skin appearance exhibited: Erythema. The surrounding wound skin color is noted with erythema which is circumferential. Periwound temperature was noted as No Abnormality. The periwound has tenderness on palpation. Assessment Active Problems ICD-10 [phone] - Pressure ulcer of left heel, stage 3 Tornow, Nalda L. 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 There was a Skin/Subcutaneous Tissue Debridement ([id number]) debridement with total area of 35.91 sq cm performed by [name] Caul, MD. with the following instrument(s): Blade and Forceps to remove Viable and Non-Viable tissue/material including Exudate, Fibrin/Slough, and Subcutaneous after achieving pain control using Lidocaine 4% Topical Solution. A time out was conducted at 16:26, prior to the start of the procedure. A Minimum amount of bleeding was controlled with Pressure. The procedure was tolerated well with a pain level of 0 throughout and a pain level of 0 following the procedure. Post Debridement Measurements: 5.7cm length x 6.3cm width x 0.5cm depth; 14.102cm^3 volume. Post debridement Stage noted as Category/Stage III. Character of Wound/Ulcer Post Debridement requires further debridement. Post procedure Diagnosis Wound #5: Same as  Pre-Procedure Plan Wound Cleansing: Wound #10 Right,Dorsal Foot: Clean wound with Normal Saline. Cleanse wound with mild soap and water Wound #12 Right Trochanter:  Clean wound with Normal Saline. Cleanse wound with mild soap and water Wound #13 Sacrum: Clean wound with Normal Saline. Cleanse wound with mild soap and water Wound #11 Right,Plantar Metatarsal head fifth: Clean wound with Normal Saline. Cleanse wound with mild soap and water Wound #5 Right Calcaneus: Clean wound with Normal Saline. Cleanse wound with mild soap and water Paolini, Machel L. ([id number]) Wound #6 Left Calcaneus: Clean wound with Normal Saline. Cleanse wound with mild soap and water Wound #7 Left,Lateral Malleolus: Clean wound with Normal Saline. Cleanse wound with mild soap and water Wound #8 Left,Posterior Lower Leg: Clean wound with Normal Saline. Cleanse wound with mild soap and water Anesthetic: Wound #10 Right,Dorsal Foot: Topical Lidocaine 4% cream applied to wound bed prior to debridement - for clinic use Wound #12 Right Trochanter: Topical Lidocaine 4% cream applied to wound bed prior to debridement - for clinic use Wound #13 Sacrum: Topical Lidocaine 4% cream applied to wound bed prior to debridement - for clinic use Wound #11 Right,Plantar Metatarsal head fifth: Topical Lidocaine 4% cream applied to wound bed prior to debridement - for clinic use Wound #5 Right Calcaneus: Topical Lidocaine 4% cream applied to wound bed prior to debridement - for clinic use Wound #6 Left Calcaneus: Topical Lidocaine 4% cream applied to wound bed prior to debridement - for clinic use Wound #7 Left,Lateral Malleolus: Topical Lidocaine 4% cream applied to wound bed prior to debridement - for clinic use Wound #8 Left,Posterior Lower Leg: Topical Lidocaine 4% cream applied to wound bed prior to debridement - for clinic use Skin Barriers/Peri-Wound Care: Wound #10 Right,Dorsal Foot: Skin Prep Wound #12  Right Trochanter: Skin Prep Wound #13 Sacrum: Skin Prep Wound #11 Right,Plantar Metatarsal head fifth: Skin Prep Wound #7 Left,Lateral Malleolus: Skin Prep Wound #8 Left,Posterior Lower Leg: Skin Prep Primary Wound Dressing: Wound #10 Right,Dorsal Foot: Iodoflex Wound #11 Right,Plantar Metatarsal head fifth: Iodoflex Wound #5 Right Calcaneus: Iodoflex Wound #6 Left Calcaneus: Iodoflex Wound #12 Right Trochanter: Prisma Ag - moisten with saline or hydrogel Wound #13 Sacrum: Tyndall, Andrell L. ([id number]) Prisma Ag - moisten with saline or hydrogel Wound #7 Left,Lateral Malleolus: Prisma Ag - moisten with saline or hydrogel Wound #8 Left,Posterior Lower Leg: Iodoflex Secondary Dressing: Wound #10 Right,Dorsal Foot: Dry Gauze Boardered Foam Dressing Wound #11 Right,Plantar Metatarsal head fifth: Dry Gauze Boardered Foam Dressing Wound #12 Right Trochanter: Dry Gauze Boardered Foam Dressing Wound #13 Sacrum: Dry Gauze Boardered Foam Dressing Wound #5 Right Calcaneus: Dry Gauze Conform/Kerlix Other - allevyn heel cup, stretch netting #4 Wound #6 Left Calcaneus: Dry Gauze Conform/Kerlix Other - allevyn heel cup, stretch netting #4 Wound #7 Left,Lateral Malleolus: Dry Gauze Boardered Foam Dressing Wound #8 Left,Posterior Lower Leg: Dry Gauze Boardered Foam Dressing Dressing Change Frequency: Wound #10 Right,Dorsal Foot: Change dressing every other day. Wound #12 Right Trochanter: Change dressing every other day. Wound #13 Sacrum: Change dressing every other day. Wound #11 Right,Plantar Metatarsal head fifth: Change dressing every other day. Wound #5 Right Calcaneus: Change dressing every other day. Wound #6 Left Calcaneus: Change dressing every other day. Wound #7 Left,Lateral Malleolus: Change dressing every other day. Wound #8 Left,Posterior Lower Leg: Change dressing every other day. Follow-up Appointments: Wound #10 Right,Dorsal Foot: Calais,  Sabina L. ([id number]) Return Appointment in 1 week. Wound #12 Right Trochanter: Return Appointment in 1 week. Wound #13 Sacrum: Return Appointment in 1 week. Wound #11 Right,Plantar Metatarsal head fifth: Return Appointment in 1 week. Wound #5 Right Calcaneus: Return Appointment in 1 week. Wound #  6 Left Calcaneus: Return Appointment in 1 week. Wound #7 Left,Lateral Malleolus: Return Appointment in 1 week. Wound #8 Left,Posterior Lower Leg: Return Appointment in 1 week. Edema Control: Wound #10 Right,Dorsal Foot: Elevate legs to the level of the heart and pump ankles as often as possible Wound #11 Right,Plantar Metatarsal head fifth: Elevate legs to the level of the heart and pump ankles as often as possible Wound #5 Right Calcaneus: Elevate legs to the level of the heart and pump ankles as often as possible Wound #6 Left Calcaneus: Elevate legs to the level of the heart and pump ankles as often as possible Wound #7 Left,Lateral Malleolus: Elevate legs to the level of the heart and pump ankles as often as possible Wound #8 Left,Posterior Lower Leg: Elevate legs to the level of the heart and pump ankles as often as possible Off-Loading: Wound #10 Right,Dorsal Foot: Turn and reposition every 2 hours Other: - wear sage boots Wound #12 Right Trochanter: Turn and reposition every 2 hours Other: - wear sage boots Wound #13 Sacrum: Turn and reposition every 2 hours Other: - wear sage boots Wound #11 Right,Plantar Metatarsal head fifth: Turn and reposition every 2 hours Other: - wear sage boots Wound #5 Right Calcaneus: Turn and reposition every 2 hours Other: - wear sage boots Wound #6 Left Calcaneus: Turn and reposition every 2 hours Other: - wear sage boots Wound #7 Left,Lateral Malleolus: Turn and reposition every 2 hours Other: - wear sage boots Wound #8 Left,Posterior Lower Leg: Sauber, Emmali L. ([id number]) Turn and reposition every 2 hours Other: - wear  sage boots Additional Orders / Instructions: Wound #10 Right,Dorsal Foot: Increase protein intake. Other: - left 5th plantar metatarsal head paint with betadine every day (deep tissue injury) place foam to the top of the left foot for padded protection, add bordered foam dressing to midline back to help with pressure relief Wound #12 Right Trochanter: Increase protein intake. Other: - left 5th plantar metatarsal head paint with betadine every day (deep tissue injury) place foam to the top of the left foot for padded protection, add bordered foam dressing to midline back to help with pressure relief Wound #13 Sacrum: Increase protein intake. Other: - left 5th plantar metatarsal head paint with betadine every day (deep tissue injury) place foam to the top of the left foot for padded protection, add bordered foam dressing to midline back to help with pressure relief Wound #11 Right,Plantar Metatarsal head fifth: Increase protein intake. Other: - left 5th plantar metatarsal head paint with betadine every day (deep tissue injury) place foam to the top of the left foot for padded protection, add bordered foam dressing to midline back to help with pressure relief Wound #5 Right Calcaneus: Increase protein intake. Other: - left 5th plantar metatarsal head paint with betadine every day (deep tissue injury) place foam to the top of the left foot for padded protection, add bordered foam dressing to midline back to help with pressure relief Wound #6 Left Calcaneus: Increase protein intake. Other: - left 5th plantar metatarsal head paint with betadine every day (deep tissue injury) place foam to the top of the left foot for padded protection, add bordered foam dressing to midline back to help with pressure relief Wound #7 Left,Lateral Malleolus: Increase protein intake. Other: - left 5th plantar metatarsal head paint with betadine every day (deep tissue injury) place foam to the top of the left  foot for padded protection, add bordered foam dressing to midline back to help with  pressure relief Wound #8 Left,Posterior Lower Leg: Increase protein intake. Other: - left 5th plantar metatarsal head paint with betadine every day (deep tissue injury) place foam to the top of the left foot for padded protection, add bordered foam dressing to midline back to help with pressure relief Home Health: Wound #10 Right,Dorsal Foot: Continue [facility] Visits - [facility] Nurse may visit PRN to address patient s wound care needs. FACE TO FACE ENCOUNTER: MEDICARE and MEDICAID PATIENTS: I certify that this patient is under my care and that I had a face-to-face encounter that meets the physician face-to-face encounter requirements with this patient on this date. The encounter with the patient was in whole or in part for the following MEDICAL CONDITION: (primary reason for Home Healthcare) MEDICAL NECESSITY: I certify, SHARIN, ALTIDOR ([id number]) that based on my findings, NURSING services are a medically necessary home health service. HOME BOUND STATUS: I certify that my clinical findings support that this patient is homebound (i.e., Due to illness or injury, pt requires aid of supportive devices such as crutches, cane, wheelchairs, walkers, the use of special transportation or the assistance of another person to leave their place of residence. There is a normal inability to leave the home and doing so requires considerable and taxing effort. Other absences are for medical reasons / religious services and are infrequent or of short duration when for other reasons). If current dressing causes regression in wound condition, may D/C ordered dressing product/s and apply Normal Saline Moist Dressing daily until next Wound Healing Center / Other MD appointment. Notify Wound Healing Center of regression in wound condition at [phone]. Please direct any NON-WOUND related  issues/requests for orders to patient's Primary Care Physician Wound #12 Right Trochanter: Continue Home Health Visits - [facility] Nurse may visit PRN to address patient s wound care needs. FACE TO FACE ENCOUNTER: MEDICARE and MEDICAID PATIENTS: I certify that this patient is under my care and that I had a face-to-face encounter that meets the physician face-to-face encounter requirements with this patient on this date. The encounter with the patient was in whole or in part for the following MEDICAL CONDITION: (primary reason for Home Healthcare) MEDICAL NECESSITY: I certify, that based on my findings, NURSING services are a medically necessary home health service. HOME BOUND STATUS: I certify that my clinical findings support that this patient is homebound (i.e., Due to illness or injury, pt requires aid of supportive devices such as crutches, cane, wheelchairs, walkers, the use of special transportation or the assistance of another person to leave their place of residence. There is a normal inability to leave the home and doing so requires considerable and taxing effort. Other absences are for medical reasons / religious services and are infrequent or of short duration when for other reasons). If current dressing causes regression in wound condition, may D/C ordered dressing product/s and apply Normal Saline Moist Dressing daily until next Wound Healing Center / Other MD appointment. Notify Wound Healing Center of regression in wound condition at [phone]. Please direct any NON-WOUND related issues/requests for orders to patient's Primary Care Physician Wound #13 Sacrum: Continue [facility] Visits - [facility] Nurse may visit PRN to address patient s wound care needs. FACE TO FACE ENCOUNTER: MEDICARE and MEDICAID PATIENTS: I certify that this patient is under my care and that I had a face-to-face encounter that meets the physician face-to-face  encounter requirements with this patient on this date.  The encounter with the patient was in whole or in part for the following MEDICAL CONDITION: (primary reason for Home Healthcare) MEDICAL NECESSITY: I certify, that based on my findings, NURSING services are a medically necessary home health service. HOME BOUND STATUS: I certify that my clinical findings support that this patient is homebound (i.e., Due to illness or injury, pt requires aid of supportive devices such as crutches, cane, wheelchairs, walkers, the use of special transportation or the assistance of another person to leave their place of residence. There is a normal inability to leave the home and doing so requires considerable and taxing effort. Other absences are for medical reasons / religious services and are infrequent or of short duration when for other reasons). If current dressing causes regression in wound condition, may D/C ordered dressing product/s and apply Normal Saline Moist Dressing daily until next Wound Healing Center / Other MD appointment. Notify Wound Healing Center of regression in wound condition at [phone]. Please direct any NON-WOUND related issues/requests for orders to patient's Primary Care Physician Wound #11 Right,Plantar Metatarsal head fifth: Continue Home Health Visits - [facility] Nurse may visit PRN to address patient s wound care needs. FACE TO FACE ENCOUNTER: MEDICARE and MEDICAID PATIENTS: I certify that this patient is under my care and that I had a face-to-face encounter that meets the physician face-to-face encounter requirements with this patient on this date. The encounter with the patient was in whole or in part for the following MEDICAL CONDITION: (primary reason for Home Healthcare) MEDICAL NECESSITY: I certify, TELA, KOTECKI ([id number]) that based on my findings, NURSING services are a medically necessary home health service. HOME BOUND STATUS: I certify  that my clinical findings support that this patient is homebound (i.e., Due to illness or injury, pt requires aid of supportive devices such as crutches, cane, wheelchairs, walkers, the use of special transportation or the assistance of another person to leave their place of residence. There is a normal inability to leave the home and doing so requires considerable and taxing effort. Other absences are for medical reasons / religious services and are infrequent or of short duration when for other reasons). If current dressing causes regression in wound condition, may D/C ordered dressing product/s and apply Normal Saline Moist Dressing daily until next Wound Healing Center / Other MD appointment. Notify Wound Healing Center of regression in wound condition at [phone]. Please direct any NON-WOUND related issues/requests for orders to patient's Primary Care Physician Wound #5 Right Calcaneus: Continue Home Health Visits - [facility] Nurse may visit PRN to address patient s wound care needs. FACE TO FACE ENCOUNTER: MEDICARE and MEDICAID PATIENTS: I certify that this patient is under my care and that I had a face-to-face encounter that meets the physician face-to-face encounter requirements with this patient on this date. The encounter with the patient was in whole or in part for the following MEDICAL CONDITION: (primary reason for Home Healthcare) MEDICAL NECESSITY: I certify, that based on my findings, NURSING services are a medically necessary home health service. HOME BOUND STATUS: I certify that my clinical findings support that this patient is homebound (i.e., Due to illness or injury, pt requires aid of supportive devices such as crutches, cane, wheelchairs, walkers, the use of special transportation or the assistance of another person to leave their place of residence. There is a normal inability to leave the home and doing so requires considerable and taxing effort.  Other absences are for  medical reasons / religious services and are infrequent or of short duration when for other reasons). If current dressing causes regression in wound condition, may D/C ordered dressing product/s and apply Normal Saline Moist Dressing daily until next Wound Healing Center / Other MD appointment. Notify Wound Healing Center of regression in wound condition at [phone]. Please direct any NON-WOUND related issues/requests for orders to patient's Primary Care Physician Wound #6 Left Calcaneus: Continue [facility] Visits - [facility] Nurse may visit PRN to address patient s wound care needs. FACE TO FACE ENCOUNTER: MEDICARE and MEDICAID PATIENTS: I certify that this patient is under my care and that I had a face-to-face encounter that meets the physician face-to-face encounter requirements with this patient on this date. The encounter with the patient was in whole or in part for the following MEDICAL CONDITION: (primary reason for Home Healthcare) MEDICAL NECESSITY: I certify, that based on my findings, NURSING services are a medically necessary home health service. HOME BOUND STATUS: I certify that my clinical findings support that this patient is homebound (i.e., Due to illness or injury, pt requires aid of supportive devices such as crutches, cane, wheelchairs, walkers, the use of special transportation or the assistance of another person to leave their place of residence. There is a normal inability to leave the home and doing so requires considerable and taxing effort. Other absences are for medical reasons / religious services and are infrequent or of short duration when for other reasons). If current dressing causes regression in wound condition, may D/C ordered dressing product/s and apply Normal Saline Moist Dressing daily until next Wound Healing Center / Other MD appointment. Notify Wound Healing Center of regression in wound condition at  [phone]. Please direct any NON-WOUND related issues/requests for orders to patient's Primary Care Physician Wound #7 Left,Lateral Malleolus: Continue [facility] Nurse may visit PRN to address patient s wound care needs. FACE TO FACE ENCOUNTER: MEDICARE and MEDICAID PATIENTS: I certify that this patient is under my care and that I had a face-to-face encounter that meets the physician face-to-face encounter requirements with this patient on this date. The encounter with the patient was in whole or in part for the following MEDICAL CONDITION: (primary reason for Home Healthcare) MEDICAL NECESSITY: I certify, AHLIYA, GLATT ([id number]) that based on my findings, NURSING services are a medically necessary home health service. HOME BOUND STATUS: I certify that my clinical findings support that this patient is homebound (i.e., Due to illness or injury, pt requires aid of supportive devices such as crutches, cane, wheelchairs, walkers, the use of special transportation or the assistance of another person to leave their place of residence. There is a normal inability to leave the home and doing so requires considerable and taxing effort. Other absences are for medical reasons / religious services and are infrequent or of short duration when for other reasons). If current dressing causes regression in wound condition, may D/C ordered dressing product/s and apply Normal Saline Moist Dressing daily until next Wound Healing Center / Other MD appointment. Notify Wound Healing Center of regression in wound condition at [phone]. Please direct any NON-WOUND related issues/requests for orders to patient's Primary Care Physician Wound #8 Left,Posterior Lower Leg: Continue [facility] - [facility] Nurse may visit PRN to address patient s wound care needs. FACE TO FACE ENCOUNTER: MEDICARE and MEDICAID PATIENTS: I certify that this  patient is under my care  and that I had a face-to-face encounter that meets the physician face-to-face encounter requirements with this patient on this date. The encounter with the patient was in whole or in part for the following MEDICAL CONDITION: (primary reason for Home Healthcare) MEDICAL NECESSITY: I certify, that based on my findings, NURSING services are a medically necessary home health service. HOME BOUND STATUS: I certify that my clinical findings support that this patient is homebound (i.e., Due to illness or injury, pt requires aid of supportive devices such as crutches, cane, wheelchairs, walkers, the use of special transportation or the assistance of another person to leave their place of residence. There is a normal inability to leave the home and doing so requires considerable and taxing effort. Other absences are for medical reasons / religious services and are infrequent or of short duration when for other reasons). If current dressing causes regression in wound condition, may D/C ordered dressing product/s and apply Normal Saline Moist Dressing daily until next Wound Healing Center / Other MD appointment. Notify Wound Healing Center of regression in wound condition at [phone]. Please direct any NON-WOUND related issues/requests for orders to patient's Primary Care Physician #1 I'm concerned about the right foot vis--vis vascular supply. I don't think it would be possible to do noninvasive vascular tests on this patient. If these wounds deteriorate I could see a below-knee amputation and I told the son this today. #2 wounds on the left foot are about the same #3 plain x-ray suggested the possibility of early osteomyelitis on the right lateral fifth metatarsal head. I cannot see doing an MRI on this. We'll follow this clinically for now #4 new wounds at least to [place] on her right sacrum and ishium we will dress with silver collagen. #5 no change to the wounds which  included Iodoflex to the wounds on the right sober collagen on the left #6 follow up in 1 month ARDEN, AXON ([id number]) Electronic [name](s) Signed: 03/09/2017 9:14:35 AM By: Elliot Gurney, BSN, RN, CWS, Kim RN, BSN Signed: 03/17/2017 7:50:37 AM By: [name] Najjar MD [name]: 02/10/2017 6:25:53 PM Version By: [name] Najjar MD Entered By: Elliot Gurney, BSN, RN, CWS, Kim on 03/09/2017 09:07:21 Essner, [name] ([id number]) -------------------------------------------------------------------------------- SuperBill Details [name] Date of Service: 02/10/2017 Patient Name: [name] Account Number: [id number] Medical Record Treating RN: [name] [id number] Number: Other Clinician: Aug 30, 1920 (83 y.o. 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 L89.152 Pressure ulcer of sacral region, stage 2 L89.312 Pressure ulcer of right buttock, stage 2 Facility Procedures CPT4 Code: [id number] Description: 11042 - DEB SUBQ TISSUE 20 SQ CM/< ICD-10 Description Diagnosis L89.623 Pressure ulcer of left heel, stage 3 Modifier: Quantity: 1 CPT4 Code: [id number] Description: 11045 - DEB SUBQ TISS EA ADDL 20CM ICD-10 Description Diagnosis L89.623 Pressure ulcer of left heel, stage 3 Modifier: Quantity: 1 Physician Procedures CPT4 Code: [id number] Description: 11042 - WC PHYS SUBQ TISS 20 SQ CM ICD-10 Description Diagnosis L89.623 Pressure ulcer of left heel, stage 3 Modifier: Quantity: 1 CPT4 Code: [id number] Plocher, ELIZABE Description: 11045 - WC PHYS SUBQ  TISS EA ADDL 20 CM ICD-10 Description Diagnosis TH L. ([id number]) Modifier: Quantity: 1  Electronic [name](s) Signed: 02/10/2017 6:25:53 PM By: [name] Najjar MD Entered By: [name] on 02/10/2017 18:21:35

## 2017-02-17 ENCOUNTER — Ambulatory Visit: Payer: Medicare Other | Admitting: Internal Medicine

## 2017-03-10 ENCOUNTER — Encounter: Payer: Medicare Other | Attending: Internal Medicine | Admitting: Internal Medicine

## 2017-03-10 DIAGNOSIS — L89312 Pressure ulcer of right buttock, stage 2: Secondary | ICD-10-CM | POA: Diagnosis not present

## 2017-03-10 DIAGNOSIS — L89623 Pressure ulcer of left heel, stage 3: Secondary | ICD-10-CM | POA: Insufficient documentation

## 2017-03-10 DIAGNOSIS — L89152 Pressure ulcer of sacral region, stage 2: Secondary | ICD-10-CM | POA: Insufficient documentation

## 2017-03-10 DIAGNOSIS — L89523 Pressure ulcer of left ankle, stage 3: Secondary | ICD-10-CM | POA: Insufficient documentation

## 2017-03-10 DIAGNOSIS — L97223 Non-pressure chronic ulcer of left calf with necrosis of muscle: Secondary | ICD-10-CM | POA: Diagnosis not present

## 2017-03-10 DIAGNOSIS — L89613 Pressure ulcer of right heel, stage 3: Secondary | ICD-10-CM | POA: Diagnosis not present

## 2017-03-10 DIAGNOSIS — G301 Alzheimer's disease with late onset: Secondary | ICD-10-CM | POA: Insufficient documentation

## 2017-03-11 NOTE — Progress Notes (Signed)
[name], [name] ([id number]) Visit Report for 03/10/2017 Debridement Details [name] Date of Service: 03/10/2017 1:30 PM Patient Name: [name] Account Number: [id number] Medical Record Treating RN: [name] [id number] Number: Other Clinician: April 27, 1921 (83 y.o. Treating [name], [name] Date of Birth/Sex: Female) Provider/Extender: G Primary Care Provider: [name] Referring Provider: [name] in Treatment: 13 Debridement Performed for Wound #5 Right Calcaneus Assessment: Performed By: Physician [name], MD Debridement: Debridement Pre-procedure Verification/Time Out Yes - 14:26 Taken: Start Time: 14:27 Pain Control: Lidocaine 4% Topical Solution Level: Skin/Subcutaneous Tissue Total Area Debrided (L x 5.5 (cm) x 6 (cm) = 33 (cm) W): Tissue and other Viable, Non-Viable, Exudate, Fibrin/Slough, Subcutaneous material debrided: Instrument: Blade, Forceps Bleeding: Minimum Hemostasis Achieved: Pressure End Time: 14:29 Procedural Pain: 0 Post Procedural Pain: 0 Response to Treatment: Procedure was tolerated well Post Debridement Measurements of Total Wound Length: (cm) 5.5 Stage: Category/Stage III Width: (cm) 6 Depth: (cm) 0.4 Volume: (cm) 10.367 Character of Wound/Ulcer Post Requires Further Debridement: Debridement Post Procedure Diagnosis Same as Pre-procedure [name], [name] ([id number]) Electronic Signature(s) Signed: 03/10/2017 4:59:51 PM By: [name] Signed: 03/10/2017 5:02:24 PM By: Baltazar Najjar MD Entered By: Baltazar Najjar on 03/10/2017 16:54:56 [name], [name] -------------------------------------------------------------------------------- HPI Details [name] Date of Service: 03/10/2017 1:30 PM Patient Name: [name] Account Number: [id number] Medical Record Treating RN: [name] [id number] Number: Other Clinician: [birth year] (83 y.o. Treating  [name], [name] Date of Birth/Sex: Female) Provider/Extender: G Primary Care Provider: [name] Referring Provider: [name] in Treatment: 13 History of Present Illness HPI Description: 03/24/16; this is an elderly 83 year old woman with advanced dementia who was hospitalized from 5/8 through 5/13. At that point she had Proteus sepsis felt to be secondary to a UTI the. Acute kidney injury and delirium. Her son who is her primary caregiver says she came home from the hospital with wounds on her right lower extremity and apparently her right buttock as well. There is no mention on the hospital discharge summary of problems. She has been having [facility] and have been using silver alginate based dressings. Apparently the area on the right buttock is healing and the son stated we did not need to become involved with that. In any case the patient has for wounds to on the right heel and 2 on the posterior lateral right calf, the latter of which is not a usual pressure area however that is the history. She is apparently eating and drinking fairly well. Takes ensure well. She does not have any pressure-relief surface at home. I have reviewed her lab work from the hospital. Her admission albumin was 3.4 on 5/8 04/01/16; patient arrives with wounds looking much the same as last week. She has an extensive pressure area over her right heel and to small but deep wounds on the lateral aspect of her right lower leg. These wounds are not connected. Although there are advertised his pressure ulcers these 2 wounds are not usual for pressure ulcerations. We have not looked at the pressure ulceration on her buttock at the request of the patient's son who is the primary caregiver 04/08/16; wounds are stable to improved this week. Culture I did of the lower wound on her right lateral leg grew methicillin sensitive staph aureus she is on Septra. We are using silver alginate to all the  wounds. 04/15/16; the 2 small areas on the lateral aspect of this lady's right leg continued to improve however the heel is once again  covered with callus, residual alginate, acrotic material all of which requires a difficult debridement. There continues to be purulent material under this surface. This is in spite of a week of Septra that I gave him for MSSA 04/22/16 Patient today presents for follow-up evaluation. She is seen with her son in the office at this point in time she does have Alzheimer's. The good news is her wounds appeared to be somewhat smaller with current therapy. At this point in time the infection which was cultured previously appears to have improved in regard to the right heel wound. There is no purulent drainage at this point in time and a good portion of the wound actually is eschar covered and dry with a medial portion actually open to granulation with very little slough covering. She really has no significant discomfort with palpation manipulation of the wound at this point in time. 04/28/16 patient presents today for follow-up evaluation concerning her right lateral calf wound as well as right heel wound. she has not really been complaining this for as pain is concerned according to her son seen with her in the office at this point in time today. She seems to show no interval signs or symptoms of infection overall has been tolerating the dressing changes well. She does have Alzheimer's therefore is not able to rate her pain although she does respond with an affirmative that she hurts when I press over the Highley, Hazelyn L. ([id number]) heel region. 05/05/16; the area on the lateral aspect of her right calf is healed. The right heel as 2 small spots that are still open and very close to resolving as well using Aquacel Ag under border foam, 05/20/16; the area on the lateral aspect of her right calf remains closed. The 2 small areas on her right heel are also  closed. READMISSION 12/09/16; this is a 83 year old woman with advanced dementia that we cared for in the clinic from September to the beginning of November 2017. At that point she had a right heel, right lateral calf and right buttock wound all felt to be secondary to pressure. These eventually closed over. The patient's son who is the primary caregiver states that over the last 2 months she is developed bilateral lower extremity pressure ulcers. [facility] has [facility] home and they have been applying Medihoney and an alginate. These were apparently all pressure ulcers today including area on the left posterior calf. Apparently they were keeping her leg elevated on the pillow which contributed to this. The patient has advanced dementia, is nonambulatory and not able to move herself in bed. There is apparently not a nutritional issue. She has [facility]. 12/16/16; patient was readmitted to the clinic last week with multiple wounds including the medial and lateral aspects of the left calcaneus left posterior calf right calcaneus. We've been using Iodoflex, dressing change by home health. Her son also reports that she has had trouble breathing which she attributes to the low air loss mattress we have for pressure relief. He states that when he took her out of bed and put her in her chair breathing improved. As of Monday he took the low air loss mattress off the bed and states that she is able to go to bed without shortness of breath. His description sounded a lot like orthopnea. He is asking [place] to order a gel mattress overlay. According to her son she does not aspirate at least not frequently 12/23/16; the patient is reviewed today for multiple  wounds on her lower extremities. We have been using Iodoflex dressing change by home health. Last week the patient was Cheyne-Stokes in with an x-ray that look like heart failure with small bilateral pleural effusions. I gave her Lasix and  some potassium. She saw her primary physician earlier this week who did not add anything to this. Her son states she is doing better she has not had any of the orthopneic episodes since Monday 01/06/17; 3 week followup. using iodoflex. She has a large wound on the left lateral calf with exposed tendon, and necrotic wound over the left medial heel. Wound over the left lateral malleolus appears to be improved. On the right side necrotic wound on the right medial heel, wound on the right lateral heel looks somewhat better and the area on the left anterior lateral calf looks as though it's progressing towards closure. The patient may have arterial issues however she is too far along in terms of her dementia to undergo testing. Her son claims she is eating well and there offloading this. 01/27/17 on evaluation today patient appears to be doing somewhat worse with new wounds over the right fifth metatarsal region laterally as well as the dorsal right foot. Both of these appear to be deep tissue injuries which have now opened over the two weeks since we last saw her. She also has a mildly tissue injury in the fifth metatarsal region on the left although this does not appear to be worsening and in fact I think wound up being okay. Nonetheless she does have skin in which is loose of the right heel as well as the left posterior calf region. She wears the [employer] when she is in bed but has not been wearing them when she is in her wheelchair. 02/03/17; culture that was done last week of the right dorsal foot grew both methicillin sensitive staph aureus [name], Lynasia L. ([id number]) and Pseudomonas. She was initially thought to have Cipro however that up apparently increases QT along with Aricept was not aware that Aricept did this. This was changed to cefdinir and Septra however the patient is apparently allergic to Septra even though it is not listed in our records. The patient started the cefdinir  yesterday. In spite of the delay of antibiotics the area on the right dorsal foot actually looks a lot better than last week with a thick covering eschar and necrotic tissue but no surrounding erythema or drainage. I also took the opportunity today to review her vascular status. When she was in our clinic the first time her ABI on the left was 0.9 and on the right noncompressible. It is still noncompressible today. She has not had imaging studies she has the following wounds including the right dorsal ankle/foot, right heel and right fifth metatarsal head. On the left side she has the medial left heel the left lateral malleolus and the left posterior lateral calf. We have been using Iodoflex on the right and Prisma on the left 02/10/17; in addition to the bilateral foot and calf wounds. It is apparently become clear that the patient actually has a small stage II wound on her coccyx and the right. ishium. They have been apparently therefore many months perhaps total of 8 or 9. Home health is been taking care of these and recently decided that he needed orders. With regards to the lower extremity wounds the really worrisome areas here are the heel on the right. X-rays I ordered last week showed a possible area of  cortical erosion involving the distal lateral fifth metatarsal head question early osteomyelitis. There was no common and on the right heel which was the worrisome area. The left tib-fib showed no osseous abnormality. Left foot showed no acute osseous abnormality she continues to have wounds on the right heel, right anterior foot, right lateral fifth metatarsal head, left medial heel, left lateral heel, left lateral calf as well as the new wounds at least as noted above 03/10/17: I see this patient with advanced dementia monthly on a palliative basis. Presenting with 11 identifiable wounds today. Per her son who is the primary caregiver she eats and drinks well. He still brings her to  doctor's appointments and brings her out for hair appointments. I previously suggested hospice care to this patient's son however he is not willing to listen to this at this point Electronic Signature(s) Signed: 03/10/2017 5:02:24 PM By: Baltazar Najjar MD Entered By: Baltazar Najjar on 03/10/2017 16:56:52 [name], [name] ([id number]) -------------------------------------------------------------------------------- Physical Exam Details [name] Date of Service: 03/10/2017 1:30 PM Patient Name: [name] Account Number: [id number] Medical Record Treating RN: [name] [id number] Number: Other Clinician: 11/12/1920 (82 y.o. Treating [name], [name] Date of Birth/Sex: Female) Provider/Extender: G Primary Care Provider: [name] Referring Provider: [name] Weeks in Treatment: 13 Constitutional Patient is hypertensive.. Pulse regular and within target range for patient.Marland Kitchen Respirations regular, non-labored and within target range.. Temperature is normal and within the target range for the patient.Marland Kitchen appears in no distress. Eyes Conjunctivae have some redness. Mild discharge noted.. Notes Wound exam large wound on the Achilles right heel. Copious necrotic and subcutaneous tissue removed with pickups and scalpel. Right anterior ankle wound appears stable Right fifth metatarsal head also appears stable. Left heel small punched out stage III wound left great toe superficial area on the tip of her toe Left lateral malleolus is strangely enough epithelialized and looks closely healed Left calf tendon exposure however this is actually better she has superficial excoriations on her lower back and right buttock. These looks superficial. The patient has a probing small wound in the right upper buttock area. Electronic Signature(s) Signed: 03/10/2017 5:02:24 PM By: Baltazar Najjar MD Entered By: Baltazar Najjar on 03/10/2017 16:59:14 [name], [name]  ([id number]) -------------------------------------------------------------------------------- Physician Orders Details [name] Date of Service: 03/10/2017 1:30 PM Patient Name: [name] Account Number: [id number] Medical Record Treating RN: [name] [id number] Number: Other Clinician: 03/30/1921 (83 y.o. Treating [name], [name] Date of Birth/Sex: Female) Provider/Extender: G Primary Care Provider: [name] Referring Provider: [name] in Treatment: 13 Verbal / Phone Orders: Yes Clinician: Ashok Cordia, Debi Read Back and Verified: Yes Diagnosis Coding Wound Cleansing Wound #10 Right,Dorsal Foot o Clean wound with Normal Saline. o Cleanse wound with mild soap and water Wound #11 Right,Plantar Metatarsal head fifth o Clean wound with Normal Saline. o Cleanse wound with mild soap and water Wound #13 Sacrum o Clean wound with Normal Saline. o Cleanse wound with mild soap and water Wound #14 Left Gluteus o Clean wound with Normal Saline. o Cleanse wound with mild soap and water Wound #15 Midline Back o Clean wound with Normal Saline. o Cleanse wound with mild soap and water Wound #16 Left,Dorsal Toe Great o Clean wound with Normal Saline. o Cleanse wound with mild soap and water Wound #5 Right Calcaneus o Clean wound with Normal Saline. o Cleanse wound with mild soap and water Wound #6 Left Calcaneus o Clean wound with Normal Saline. o Cleanse wound with mild soap and water  Wound #7 Left,Lateral Malleolus o Clean wound with Normal Saline. [name], [name] ([id number]) o Cleanse wound with mild soap and water Wound #8 Left,Posterior Lower Leg o Clean wound with Normal Saline. o Cleanse wound with mild soap and water Anesthetic Wound #10 Right,Dorsal Foot o Topical Lidocaine 4% cream applied to wound bed prior to debridement - for clinic use Wound #11 Right,Plantar Metatarsal head fifth o  Topical Lidocaine 4% cream applied to wound bed prior to debridement - for clinic use Wound #13 Sacrum o Topical Lidocaine 4% cream applied to wound bed prior to debridement - for clinic use Wound #14 Left Gluteus o Topical Lidocaine 4% cream applied to wound bed prior to debridement - for clinic use Wound #15 Midline Back o Topical Lidocaine 4% cream applied to wound bed prior to debridement - for clinic use Wound #16 Left,Dorsal Toe Great o Topical Lidocaine 4% cream applied to wound bed prior to debridement - for clinic use Wound #5 Right Calcaneus o Topical Lidocaine 4% cream applied to wound bed prior to debridement - for clinic use Wound #6 Left Calcaneus o Topical Lidocaine 4% cream applied to wound bed prior to debridement - for clinic use Wound #7 Left,Lateral Malleolus o Topical Lidocaine 4% cream applied to wound bed prior to debridement - for clinic use Wound #8 Left,Posterior Lower Leg o Topical Lidocaine 4% cream applied to wound bed prior to debridement - for clinic use Skin Barriers/Peri-Wound Care Wound #10 Right,Dorsal Foot o Skin Prep Wound #11 Right,Plantar Metatarsal head fifth o Skin Prep Wound #13 Sacrum o Skin Prep Wound #14 Left Gluteus o Skin Prep [name], [name] ([id number]) Wound #15 Midline Back o Skin Prep Wound #7 Left,Lateral Malleolus o Skin Prep Wound #8 Left,Posterior Lower Leg o Skin Prep Primary Wound Dressing Wound #10 Right,Dorsal Foot o Iodoflex Wound #11 Right,Plantar Metatarsal head fifth o Iodoflex Wound #13 Sacrum o Iodoform packing Gauze - 1'4 inch Wound #16 Left,Dorsal Toe Great o Hydrogel o Prisma Ag - moisten with hydrogel Wound #5 Right Calcaneus o Iodoflex Wound #6 Left Calcaneus o Iodoflex Wound #7 Left,Lateral Malleolus o Hydrogel o Prisma Ag - moisten with saline or hydrogel Wound #8 Left,Posterior Lower Leg o Hydrogel o Prisma Ag - moisten with  hydrogel Secondary Dressing Wound #10 Right,Dorsal Foot o Dry Gauze o Boardered Foam Dressing Wound #11 Right,Plantar Metatarsal head fifth o Dry Gauze o Boardered Foam Dressing Wound #13 Sacrum o Dry Gauze [name], [name] ([id number]) o Boardered Foam Dressing Wound #14 Left Gluteus o Dry Gauze o Boardered Foam Dressing Wound #15 Midline Back o Dry Gauze o Boardered Foam Dressing Wound #16 Left,Dorsal Toe Great o Kerlix and Coban o Foam Wound #5 Right Calcaneus o Dry Gauze o Conform/Kerlix o Other - allevyn heel cup, stretch netting #4 Wound #6 Left Calcaneus o Dry Gauze o Conform/Kerlix o Other - allevyn heel cup, stretch netting #4 Wound #7 Left,Lateral Malleolus o Dry Gauze o Boardered Foam Dressing Wound #8 Left,Posterior Lower Leg o Dry Gauze o Boardered Foam Dressing Dressing Change Frequency Wound #10 Right,Dorsal Foot o Change dressing every other day. Wound #11 Right,Plantar Metatarsal head fifth o Change dressing every other day. Wound #13 Sacrum o Change dressing every other day. Wound #14 Left Gluteus o Change dressing every other day. Wound #15 Midline Back o Change dressing every other day. Wound #16 [address], [name] ([id number]) o Change dressing every other day. Wound #5 Right Calcaneus o Change dressing every other day. Wound #6  Left Calcaneus o Change dressing every other day. Wound #7 Left,Lateral Malleolus o Change dressing every other day. Wound #8 Left,Posterior Lower Leg o Change dressing every other day. Follow-up Appointments Wound #10 Right,Dorsal Foot o Other: - 30 days Wound #11 Right,Plantar Metatarsal head fifth o Other: - 30 days Wound #13 Sacrum o Other: - 30 days Wound #14 Left Gluteus o Other: - 30 days Wound #15 Midline Back o Other: - 30 days Wound #16 Left,Dorsal Toe Great o Other: - 30 days Wound #5  Right Calcaneus o Other: - 30 days Wound #6 Left Calcaneus o Other: - 30 days Wound #7 Left,Lateral Malleolus o Other: - 30 days Wound #8 Left,Posterior Lower Leg o Other: - 30 days Edema Control Wound #10 Right,Dorsal Foot o Elevate legs to the level of the heart and pump ankles as often as possible Breithaupt, Anique L. ([id number]) Wound #11 Right,Plantar Metatarsal head fifth o Elevate legs to the level of the heart and pump ankles as often as possible Wound #16 Left,Dorsal Toe Great o Elevate legs to the level of the heart and pump ankles as often as possible Wound #5 Right Calcaneus o Elevate legs to the level of the heart and pump ankles as often as possible Wound #6 Left Calcaneus o Elevate legs to the level of the heart and pump ankles as often as possible Wound #7 Left,Lateral Malleolus o Elevate legs to the level of the heart and pump ankles as often as possible Wound #8 Left,Posterior Lower Leg o Elevate legs to the level of the heart and pump ankles as often as possible Off-Loading Wound #10 Right,Dorsal Foot o Turn and reposition every 2 hours o Other: - wear sage boots Wound #11 Right,Plantar Metatarsal head fifth o Turn and reposition every 2 hours o Other: - wear sage boots Wound #13 Sacrum o Turn and reposition every 2 hours o Other: - wear sage boots Wound #14 Left Gluteus o Turn and reposition every 2 hours o Other: - wear sage boots Wound #15 Midline Back o Turn and reposition every 2 hours o Turn and reposition every 2 hours o Other: - wear sage boots o Other: - wear sage boots Wound #16 Left,Dorsal Toe Great o Turn and reposition every 2 hours o Other: - wear sage boots Wound #5 Right Calcaneus o Turn and reposition every 2 hours o Other: - wear sage boots [name], [name] ([id number]) Wound #6 Left Calcaneus o Turn and reposition every 2 hours o Other: - wear sage boots Wound #7  Left,Lateral Malleolus o Turn and reposition every 2 hours o Other: - wear sage boots Wound #8 Left,Posterior Lower Leg o Turn and reposition every 2 hours o Other: - wear sage boots Additional Orders / Instructions Wound #10 Right,Dorsal Foot o Increase protein intake. o Other: - left 5th plantar metatarsal head paint with betadine every day (deep tissue injury) place foam to the top of the left foot for padded protection, add bordered foam dressing to midline back to help with pressure relief Wound #11 Right,Plantar Metatarsal head fifth o Increase protein intake. o Other: - left 5th plantar metatarsal head paint with betadine every day (deep tissue injury) place foam to the top of the left foot for padded protection, add bordered foam dressing to midline back to help with pressure relief Wound #13 Sacrum o Increase protein intake. o Other: - left 5th plantar metatarsal head paint with betadine every day (deep tissue injury) place foam to the top of the left  foot for padded protection, add bordered foam dressing to midline back to help with pressure relief Wound #14 Left Gluteus o Increase protein intake. o Other: - left 5th plantar metatarsal head paint with betadine every day (deep tissue injury) place foam to the top of the left foot for padded protection, add bordered foam dressing to midline back to help with pressure relief Wound #15 Midline Back o Increase protein intake. o Other: - left 5th plantar metatarsal head paint with betadine every day (deep tissue injury) place foam to the top of the left foot for padded protection, add bordered foam dressing to midline back to help with pressure relief Wound #16 Left,Dorsal Toe Great o Increase protein intake. o Other: - left 5th plantar metatarsal head paint with betadine every day (deep tissue injury) place foam to the top of the left foot for padded protection, add bordered foam dressing to  midline back to help with pressure relief [name], [name] ([id number]) Wound #5 Right Calcaneus o Increase protein intake. o Other: - left 5th plantar metatarsal head paint with betadine every day (deep tissue injury) place foam to the top of the left foot for padded protection, add bordered foam dressing to midline back to help with pressure relief Wound #6 Left Calcaneus o Increase protein intake. o Other: - left 5th plantar metatarsal head paint with betadine every day (deep tissue injury) place foam to the top of the left foot for padded protection, add bordered foam dressing to midline back to help with pressure relief Wound #7 Left,Lateral Malleolus o Increase protein intake. o Other: - left 5th plantar metatarsal head paint with betadine every day (deep tissue injury) place foam to the top of the left foot for padded protection, add bordered foam dressing to midline back to help with pressure relief Wound #8 Left,Posterior Lower Leg o Increase protein intake. o Other: - left 5th plantar metatarsal head paint with betadine every day (deep tissue injury) place foam to the top of the left foot for padded protection, add bordered foam dressing to midline back to help with pressure relief Home Health Wound #10 Right,Dorsal Foot o [facility] o Home Health Nurse may visit PRN to address patientos wound care needs. o FACE TO FACE ENCOUNTER: MEDICARE and MEDICAID PATIENTS: I certify that this patient is under my care and that I had a face-to-face encounter that meets the physician face-to-face encounter requirements with this patient on this date. The encounter with the patient was in whole or in part for the following MEDICAL CONDITION: (primary reason for Home Healthcare) MEDICAL NECESSITY: I certify, that based on my findings, NURSING services are a medically necessary home health service. HOME BOUND STATUS: I certify that  my clinical findings support that this patient is homebound (i.e., Due to illness or injury, pt requires aid of supportive devices such as crutches, cane, wheelchairs, walkers, the use of special transportation or the assistance of another person to leave their place of residence. There is a normal inability to leave the home and doing so requires considerable and taxing effort. Other absences are for medical reasons / religious services and are infrequent or of short duration when for other reasons). o If current dressing causes regression in wound condition, may D/C ordered dressing product/s and apply Normal Saline Moist Dressing daily until next Wound Healing Center / Other MD appointment. Notify Wound Healing Center of regression in wound condition at [phone]. o Please direct any NON-WOUND related issues/requests  for orders to patient's Primary Care Physician Wound #11 Right,Plantar Metatarsal head fifth o [facility] - [facility] HAILIE, SEARIGHT ([id number]) o Home Health Nurse may visit PRN to address patientos wound care needs. o FACE TO FACE ENCOUNTER: MEDICARE and MEDICAID PATIENTS: I certify that this patient is under my care and that I had a face-to-face encounter that meets the physician face-to-face encounter requirements with this patient on this date. The encounter with the patient was in whole or in part for the following MEDICAL CONDITION: (primary reason for Home Healthcare) MEDICAL NECESSITY: I certify, that based on my findings, NURSING services are a medically necessary home health service. HOME BOUND STATUS: I certify that my clinical findings support that this patient is homebound (i.e., Due to illness or injury, pt requires aid of supportive devices such as crutches, cane, wheelchairs, walkers, the use of special transportation or the assistance of another person to leave their place of residence. There is a normal inability  to leave the home and doing so requires considerable and taxing effort. Other absences are for medical reasons / religious services and are infrequent or of short duration when for other reasons). o If current dressing causes regression in wound condition, may D/C ordered dressing product/s and apply Normal Saline Moist Dressing daily until next Wound Healing Center / Other MD appointment. Notify Wound Healing Center of regression in wound condition at [phone]. o Please direct any NON-WOUND related issues/requests for orders to patient's Primary Care Physician Wound #13 Sacrum o [facility] - [facility] o Home Health Nurse may visit PRN to address patientos wound care needs. o FACE TO FACE ENCOUNTER: MEDICARE and MEDICAID PATIENTS: I certify that this patient is under my care and that I had a face-to-face encounter that meets the physician face-to-face encounter requirements with this patient on this date. The encounter with the patient was in whole or in part for the following MEDICAL CONDITION: (primary reason for Home Healthcare) MEDICAL NECESSITY: I certify, that based on my findings, NURSING services are a medically necessary home health service. HOME BOUND STATUS: I certify that my clinical findings support that this patient is homebound (i.e., Due to illness or injury, pt requires aid of supportive devices such as crutches, cane, wheelchairs, walkers, the use of special transportation or the assistance of another person to leave their place of residence. There is a normal inability to leave the home and doing so requires considerable and taxing effort. Other absences are for medical reasons / religious services and are infrequent or of short duration when for other reasons). o If current dressing causes regression in wound condition, may D/C ordered dressing product/s and apply Normal Saline Moist Dressing daily until next Wound Healing Center /  Other MD appointment. Notify Wound Healing Center of regression in wound condition at [phone]. o Please direct any NON-WOUND related issues/requests for orders to patient's Primary Care Physician Wound #14 Left Gluteus o [facility] - [facility] o Home Health Nurse may visit PRN to address patientos wound care needs. o FACE TO FACE ENCOUNTER: MEDICARE and MEDICAID PATIENTS: I certify that this patient is under my care and that I had a face-to-face encounter that meets the physician face-to-face encounter requirements with this patient on this date. The encounter with the patient was in whole or in part for the following MEDICAL CONDITION: (primary reason for Home Healthcare) MEDICAL NECESSITY: I certify, that based on my findings, NURSING services  are a medically necessary home health service. HOME BOUND STATUS: I certify that my clinical findings support that this patient is homebound (i.e., Due to illness or injury, pt requires aid of JADWIGA, FAIDLEY. ([id number]) supportive devices such as crutches, cane, wheelchairs, walkers, the use of special transportation or the assistance of another person to leave their place of residence. There is a normal inability to leave the home and doing so requires considerable and taxing effort. Other absences are for medical reasons / religious services and are infrequent or of short duration when for other reasons). o If current dressing causes regression in wound condition, may D/C ordered dressing product/s and apply Normal Saline Moist Dressing daily until next Wound Healing Center / Other MD appointment. Notify Wound Healing Center of regression in wound condition at [phone]. o Please direct any NON-WOUND related issues/requests for orders to patient's Primary Care Physician Wound #15 Midline Back o [facility] - [facility] o Home Health Nurse may visit PRN to address patientos  wound care needs. o FACE TO FACE ENCOUNTER: MEDICARE and MEDICAID PATIENTS: I certify that this patient is under my care and that I had a face-to-face encounter that meets the physician face-to-face encounter requirements with this patient on this date. The encounter with the patient was in whole or in part for the following MEDICAL CONDITION: (primary reason for Home Healthcare) MEDICAL NECESSITY: I certify, that based on my findings, NURSING services are a medically necessary home health service. HOME BOUND STATUS: I certify that my clinical findings support that this patient is homebound (i.e., Due to illness or injury, pt requires aid of supportive devices such as crutches, cane, wheelchairs, walkers, the use of special transportation or the assistance of another person to leave their place of residence. There is a normal inability to leave the home and doing so requires considerable and taxing effort. Other absences are for medical reasons / religious services and are infrequent or of short duration when for other reasons). o If current dressing causes regression in wound condition, may D/C ordered dressing product/s and apply Normal Saline Moist Dressing daily until next Wound Healing Center / Other MD appointment. Notify Wound Healing Center of regression in wound condition at [phone]. o Please direct any NON-WOUND related issues/requests for orders to patient's Primary Care Physician Wound #16 Left,Dorsal Toe Great o [facility] - [facility] o Home Health Nurse may visit PRN to address patientos wound care needs. o FACE TO FACE ENCOUNTER: MEDICARE and MEDICAID PATIENTS: I certify that this patient is under my care and that I had a face-to-face encounter that meets the physician face-to-face encounter requirements with this patient on this date. The encounter with the patient was in whole or in part for the following MEDICAL CONDITION: (primary  reason for Home Healthcare) MEDICAL NECESSITY: I certify, that based on my findings, NURSING services are a medically necessary home health service. HOME BOUND STATUS: I certify that my clinical findings support that this patient is homebound (i.e., Due to illness or injury, pt requires aid of supportive devices such as crutches, cane, wheelchairs, walkers, the use of special transportation or the assistance of another person to leave their place of residence. There is a normal inability to leave the home and doing so requires considerable and taxing effort. Other absences are for medical reasons / religious services and are infrequent or of short duration when for other reasons). o If current dressing causes regression in wound condition,  may D/C ordered dressing product/s and apply Normal Saline Moist Dressing daily until next Wound Healing Center / Other MD appointment. Notify Wound Healing Center of regression in wound condition at [phone]. TEONNA, COONAN ([id number]) o Please direct any NON-WOUND related issues/requests for orders to patient's Primary Care Physician Wound #5 Right Calcaneus o [facility] - [facility] o Home Health Nurse may visit PRN to address patientos wound care needs. o FACE TO FACE ENCOUNTER: MEDICARE and MEDICAID PATIENTS: I certify that this patient is under my care and that I had a face-to-face encounter that meets the physician face-to-face encounter requirements with this patient on this date. The encounter with the patient was in whole or in part for the following MEDICAL CONDITION: (primary reason for Home Healthcare) MEDICAL NECESSITY: I certify, that based on my findings, NURSING services are a medically necessary home health service. HOME BOUND STATUS: I certify that my clinical findings support that this patient is homebound (i.e., Due to illness or injury, pt requires aid of supportive devices such as crutches,  cane, wheelchairs, walkers, the use of special transportation or the assistance of another person to leave their place of residence. There is a normal inability to leave the home and doing so requires considerable and taxing effort. Other absences are for medical reasons / religious services and are infrequent or of short duration when for other reasons). o If current dressing causes regression in wound condition, may D/C ordered dressing product/s and apply Normal Saline Moist Dressing daily until next Wound Healing Center / Other MD appointment. Notify Wound Healing Center of regression in wound condition at [phone]. o Please direct any NON-WOUND related issues/requests for orders to patient's Primary Care Physician Wound #6 Left Calcaneus o [facility] - [facility] o Home Health Nurse may visit PRN to address patientos wound care needs. o FACE TO FACE ENCOUNTER: MEDICARE and MEDICAID PATIENTS: I certify that this patient is under my care and that I had a face-to-face encounter that meets the physician face-to-face encounter requirements with this patient on this date. The encounter with the patient was in whole or in part for the following MEDICAL CONDITION: (primary reason for Home Healthcare) MEDICAL NECESSITY: I certify, that based on my findings, NURSING services are a medically necessary home health service. HOME BOUND STATUS: I certify that my clinical findings support that this patient is homebound (i.e., Due to illness or injury, pt requires aid of supportive devices such as crutches, cane, wheelchairs, walkers, the use of special transportation or the assistance of another person to leave their place of residence. There is a normal inability to leave the home and doing so requires considerable and taxing effort. Other absences are for medical reasons / religious services and are infrequent or of short duration when for other reasons). o If  current dressing causes regression in wound condition, may D/C ordered dressing product/s and apply Normal Saline Moist Dressing daily until next Wound Healing Center / Other MD appointment. Notify Wound Healing Center of regression in wound condition at [phone]. o Please direct any NON-WOUND related issues/requests for orders to patient's Primary Care Physician Wound #7 Left,Lateral Malleolus o [facility] o Home Health Nurse may visit PRN to address patientos wound care needs. o FACE TO FACE ENCOUNTER: MEDICARE and MEDICAID PATIENTS: I certify that this patient is under my care and that I had a face-to-face encounter that meets the physician face-to-face [name], LETISIA  L. ([id number]) encounter requirements with this patient on this date. The encounter with the patient was in whole or in part for the following MEDICAL CONDITION: (primary reason for Home Healthcare) MEDICAL NECESSITY: I certify, that based on my findings, NURSING services are a medically necessary home health service. HOME BOUND STATUS: I certify that my clinical findings support that this patient is homebound (i.e., Due to illness or injury, pt requires aid of supportive devices such as crutches, cane, wheelchairs, walkers, the use of special transportation or the assistance of another person to leave their place of residence. There is a normal inability to leave the home and doing so requires considerable and taxing effort. Other absences are for medical reasons / religious services and are infrequent or of short duration when for other reasons). o If current dressing causes regression in wound condition, may D/C ordered dressing product/s and apply Normal Saline Moist Dressing daily until next Wound Healing Center / Other MD appointment. Notify Wound Healing Center of regression in wound condition at [phone]. o Please direct any NON-WOUND related issues/requests  for orders to patient's Primary Care Physician Wound #8 Left,Posterior Lower Leg o [facility] - [facility] o Home Health Nurse may visit PRN to address patientos wound care needs. o FACE TO FACE ENCOUNTER: MEDICARE and MEDICAID PATIENTS: I certify that this patient is under my care and that I had a face-to-face encounter that meets the physician face-to-face encounter requirements with this patient on this date. The encounter with the patient was in whole or in part for the following MEDICAL CONDITION: (primary reason for Home Healthcare) MEDICAL NECESSITY: I certify, that based on my findings, NURSING services are a medically necessary home health service. HOME BOUND STATUS: I certify that my clinical findings support that this patient is homebound (i.e., Due to illness or injury, pt requires aid of supportive devices such as crutches, cane, wheelchairs, walkers, the use of special transportation or the assistance of another person to leave their place of residence. There is a normal inability to leave the home and doing so requires considerable and taxing effort. Other absences are for medical reasons / religious services and are infrequent or of short duration when for other reasons). o If current dressing causes regression in wound condition, may D/C ordered dressing product/s and apply Normal Saline Moist Dressing daily until next Wound Healing Center / Other MD appointment. Notify Wound Healing Center of regression in wound condition at [phone]. o Please direct any NON-WOUND related issues/requests for orders to patient's Primary Care Physician Electronic Signature(s) Signed: 03/10/2017 4:59:51 PM By: [name] Signed: 03/10/2017 5:02:24 PM By: Baltazar Najjar MD Entered By: [name] on 03/10/2017 16:36:36 Wandell, [name] ([id number]) -------------------------------------------------------------------------------- Problem List  Details [name] Date of Service: 03/10/2017 1:30 PM Patient Name: [name] Account Number: [id number] Medical Record Treating RN: [name] [id number] Number: Other Clinician: Feb 18, 1921 (83 y.o. 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 Account Number: [id number] Medical Record Treating RN: [name] [id number] Number: Other Clinician: Jul 21, 1920 (83 y.o. Treating [name], [name] Date of Birth/Sex: Female) Provider/Extender: G Primary Care Provider: [name] Referring Provider: [name] Weeks in Treatment: 13 Subjective History of Present Illness (HPI) 03/24/16; this is an elderly 83 year old woman with advanced dementia who was hospitalized from 5/8 through 5/13. At that point she had Proteus sepsis felt to be secondary to a UTI the. Acute kidney injury and delirium. Her son who is her  primary caregiver says she came home from the hospital with wounds on her right lower extremity and apparently her right buttock as well. There is no mention on the hospital discharge summary of problems. She has been having [facility] and have been using silver alginate based dressings. Apparently the area on the right buttock is healing and the son stated we did not need to become involved with that. In any case the patient has for wounds to on the right heel and 2 on the posterior lateral right calf, the latter of which is not a usual pressure area however that is the history. She is apparently eating and drinking fairly well. Takes ensure well. She does not have any pressure-relief surface at home. I have reviewed her lab work from the hospital. Her admission albumin was 3.4 on 5/8 04/01/16; patient arrives with wounds looking much the same as last week. She has an extensive pressure area over her right heel and to small but deep wounds on the lateral aspect of her right lower leg. These wounds are not connected. Although there are advertised his pressure ulcers these 2 wounds are not usual for pressure ulcerations. We have not looked at the pressure ulceration on her buttock at the request of the patient's son who is the primary caregiver 04/08/16; wounds are stable to improved this week. Culture I did of the lower wound on her right lateral leg grew methicillin sensitive staph aureus she is on Septra. We are using silver alginate to all the wounds. 04/15/16; the 2 small areas on the lateral aspect of this lady's right leg continued to improve however the heel is once again covered with callus, residual alginate, acrotic material all of which requires a difficult debridement. There continues to be purulent material under this surface. This is in spite of a week of Septra that I gave him for MSSA 04/22/16 Patient today presents for follow-up evaluation. She is seen with her son in the  office at this point in time she does have Alzheimer's. The good news is her wounds appeared to be somewhat smaller with current therapy. At this point in time the infection which was cultured previously appears to have improved in regard to the right heel wound. There is no purulent drainage at this point in time and a good portion of the wound actually is eschar covered and dry with a medial portion actually open to granulation with very little slough covering. She really has no significant discomfort with palpation manipulation of the wound at this point in time. 04/28/16 patient presents today for follow-up evaluation concerning her right lateral calf wound as well as right heel wound. she has not really been complaining this for as pain  is concerned according to her son seen with her in the office at this point in time today. She seems to show no interval signs or symptoms of infection overall has been tolerating the dressing changes well. She does have Alzheimer's therefore is not [name], [name] ([id number]) able to rate her pain although she does respond with an affirmative that she hurts when I press over the heel region. 05/05/16; the area on the lateral aspect of her right calf is healed. The right heel as 2 small spots that are still open and very close to resolving as well using Aquacel Ag under border foam, 05/20/16; the area on the lateral aspect of her right calf remains closed. The 2 small areas on her right heel are also closed. READMISSION 12/09/16; this is a 83 year old woman with advanced dementia that we cared for in the clinic from September to the beginning of November 2017. At that point she had a right heel, right lateral calf and right buttock wound all felt to be secondary to pressure. These eventually closed over. The patient's son who is the primary caregiver states that over the last 2 months she is developed bilateral lower extremity pressure ulcers. [facility]  [facility] and they have been applying Medihoney and an alginate. These were apparently all pressure ulcers today including area on the left posterior calf. Apparently they were keeping her leg elevated on the pillow which contributed to this. The patient has advanced dementia, is nonambulatory and not able to move herself in bed. There is apparently not a nutritional issue. [facility] has [facility]. 12/16/16; patient was readmitted to the clinic last week with multiple wounds including the medial and lateral aspects of the left calcaneus left posterior calf right calcaneus. We've been using Iodoflex, dressing change by home health. Her son also reports that she has had trouble breathing which she attributes to the low air loss mattress we have for pressure relief. He states that when he took her out of bed and put her in her chair breathing improved. As of Monday he took the low air loss mattress off the bed and states that she is able to go to bed without shortness of breath. His description sounded a lot like orthopnea. He is asking [place] to order a gel mattress overlay. According to her son she does not aspirate at least not frequently 12/23/16; the patient is reviewed today for multiple wounds on her lower extremities. We have been using Iodoflex dressing change by home health. Last week the patient was Cheyne-Stokes in with an x-ray that look like heart failure with small bilateral pleural effusions. I gave her Lasix and some potassium. She saw her primary physician earlier this week who did not add anything to this. Her son states she is doing better she has not had any of the orthopneic episodes since Monday 01/06/17; 3 week followup. using iodoflex. She has a large wound on the left lateral calf with exposed tendon, and necrotic wound over the left medial heel. Wound over the left lateral malleolus appears to be improved. On the right side necrotic wound on the right medial  heel, wound on the right lateral heel looks somewhat better and the area on the left anterior lateral calf looks as though it's progressing towards closure. The patient may have arterial issues however she is too far along in terms of her dementia to undergo testing. Her son claims she is eating well and there offloading this. 01/27/17  on evaluation today patient appears to be doing somewhat worse with new wounds over the right fifth metatarsal region laterally as well as the dorsal right foot. Both of these appear to be deep tissue injuries which have now opened over the two weeks since we last saw her. She also has a mildly tissue injury in the fifth metatarsal region on the left although this does not appear to be worsening and in fact I think wound up being okay. Nonetheless she does have skin in which is loose of the right heel as well as the left posterior calf region. She wears the [employer] when she is in bed but has not been wearing them when she is in her wheelchair. [name], [name] ([id number]) 02/03/17; culture that was done last week of the right dorsal foot grew both methicillin sensitive staph aureus and Pseudomonas. She was initially thought to have Cipro however that up apparently increases QT along with Aricept was not aware that Aricept did this. This was changed to cefdinir and Septra however the patient is apparently allergic to Septra even though it is not listed in our records. The patient started the cefdinir yesterday. In spite of the delay of antibiotics the area on the right dorsal foot actually looks a lot better than last week with a thick covering eschar and necrotic tissue but no surrounding erythema or drainage. I also took the opportunity today to review her vascular status. When she was in our clinic the first time her ABI on the left was 0.9 and on the right noncompressible. It is still noncompressible today. She has not had imaging studies she has the  following wounds including the right dorsal ankle/foot, right heel and right fifth metatarsal head. On the left side she has the medial left heel the left lateral malleolus and the left posterior lateral calf. We have been using Iodoflex on the right and Prisma on the left 02/10/17; in addition to the bilateral foot and calf wounds. It is apparently become clear that the patient actually has a small stage II wound on her coccyx and the right. ishium. They have been apparently therefore many months perhaps total of 8 or 9. Home health is been taking care of these and recently decided that he needed orders. With regards to the lower extremity wounds the really worrisome areas here are the heel on the right. X-rays I ordered last week showed a possible area of cortical erosion involving the distal lateral fifth metatarsal head question early osteomyelitis. There was no common and on the right heel which was the worrisome area. The left tib-fib showed no osseous abnormality. Left foot showed no acute osseous abnormality she continues to have wounds on the right heel, right anterior foot, right lateral fifth metatarsal head, left medial heel, left lateral heel, left lateral calf as well as the new wounds at least as noted above 03/10/17: I see this patient with advanced dementia monthly on a palliative basis. Presenting with 11 identifiable wounds today. Per her son who is the primary caregiver she eats and drinks well. He still brings her to doctor's appointments and brings her out for hair appointments. I previously suggested hospice care to this patient's son however he is not willing to listen to this at this point Objective Constitutional Patient is hypertensive.. Pulse regular and within target range for patient.Marland Kitchen Respirations regular, non-labored and within target range.. Temperature is normal and within the target range for the patient.Marland Kitchen appears in no Flesch, Jasleen L.  (  [id number]) distress. Vitals Time Taken: 1:44 PM, Height: 69 in, Weight: 105 lbs, BMI: 15.5, Temperature: 98.0 F, Pulse: 90 bpm, Respiratory Rate: 16 breaths/min, Blood Pressure: 151/65 mmHg. General Notes: Temp taken axillary. Eyes Conjunctivae have some redness. Mild discharge noted.. General Notes: Wound exam large wound on the Achilles right heel. Copious necrotic and subcutaneous tissue removed with pickups and scalpel. Right anterior ankle wound appears stable Right fifth metatarsal head also appears stable. Left heel small punched out stage III wound left great toe superficial area on the tip of her toe Left lateral malleolus is strangely enough epithelialized and looks closely healed Left calf tendon exposure however this is actually better she has superficial excoriations on her lower back and right buttock. These looks superficial. The patient has a probing small wound in the right upper buttock area. Integumentary (Hair, Skin) Wound #10 status is Open. Original cause of wound was Pressure Injury. The wound is located on the Right,Dorsal Foot. The wound measures 1.7cm length x 1.7cm width x 0.2cm depth; 2.27cm^2 area and 0.454cm^3 volume. Wound #11 status is Open. Original cause of wound was Pressure Injury. The wound is located on the Right,Plantar Metatarsal head fifth. The wound measures 0.5cm length x 0.4cm width x 0.1cm depth; 0.157cm^2 area and 0.016cm^3 volume. Wound #12 status is Healed - Epithelialized. Original cause of wound was Pressure Injury. The wound is located on the Right Trochanter. The wound measures 0cm length x 0cm width x 0cm depth; 0cm^2 area and 0cm^3 volume. Wound #13 status is Open. Original cause of wound was Pressure Injury. The wound is located on the Sacrum. The wound measures 0.2cm length x 0.4cm width x 0.7cm depth; 0.063cm^2 area and 0.044cm^3 volume. Wound #14 status is Open. Original cause of wound was Pressure Injury. The wound is located on  the Left Gluteus. The wound measures 0.5cm length x 0.3cm width x 0.1cm depth; 0.118cm^2 area and 0.012cm^3 volume. There is no tunneling or undermining noted. There is a large amount of serosanguineous drainage noted. The wound margin is flat and intact. There is large (67-100%) pink granulation within the wound bed. There is a small (1-33%) amount of necrotic tissue within the wound bed including Adherent Slough. Periwound temperature was noted as No Abnormality. The periwound has tenderness on palpation. Wound #15 status is Open. Original cause of wound was Pressure Injury. The wound is located on the Midline Back. The wound measures 4cm length x 0.3cm width x 0.1cm depth; 0.942cm^2 area and 0.094cm^3 volume. There is no tunneling or undermining noted. There is a medium amount of serosanguineous drainage noted. The wound margin is flat and intact. There is large (67-100%) red, pink granulation within the wound bed. There is a small (1-33%) amount of necrotic tissue within the wound bed including Adherent Slough. Periwound temperature was noted as No Abnormality. The periwound has tenderness on palpation. Wound #16 status is Open. Original cause of wound was Other Lesion. The wound is located on the [place]. ([id number]) Left,Dorsal Toe Great. The wound measures 0.4cm length x 0.4cm width x 0.1cm depth; 0.126cm^2 area and 0.013cm^3 volume. There is no tunneling or undermining noted. There is a medium amount of serosanguineous drainage noted. The wound margin is flat and intact. There is large (67-100%) red, pink granulation within the wound bed. There is no necrotic tissue within the wound bed. Periwound temperature was noted as No Abnormality. The periwound has tenderness on palpation. Wound #5 status is Open. Original cause of wound was Pressure Injury. The  wound is located on the Right Calcaneus. The wound measures 5.5cm length x 6cm width x 0.4cm depth; 25.918cm^2 area  and 10.367cm^3 volume. Wound #6 status is Open. Original cause of wound was Pressure Injury. The wound is located on the Left Calcaneus. The wound measures 0.6cm length x 1.2cm width x 0.5cm depth; 0.565cm^2 area and 0.283cm^3 volume. Wound #7 status is Open. Original cause of wound was Pressure Injury. The wound is located on the Left,Lateral Malleolus. The wound measures 0.1cm length x 0.1cm width x 0.1cm depth; 0.008cm^2 area and 0.001cm^3 volume. Wound #8 status is Open. Original cause of wound was Pressure Injury. The wound is located on the Left,Posterior Lower Leg. The wound measures 2.8cm length x 0.7cm width x 0.6cm depth; 1.539cm^2 area and 0.924cm^3 volume. 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 There was a Skin/Subcutaneous Tissue Debridement ([id number]) debridement with total area of 33 sq cm performed by [name], MD. with the following instrument(s): Blade and Forceps to remove Viable and Non-Viable tissue/material including Exudate, Fibrin/Slough, and Subcutaneous after achieving pain control Steuber, Khristina L. ([id number]) using Lidocaine 4% Topical Solution. A time out was conducted at 14:26, prior to the start of the procedure. A Minimum amount of bleeding was controlled with Pressure. The procedure was tolerated well with a pain level of 0 throughout and a pain level of 0 following the procedure. Post Debridement Measurements: 5.5cm length x 6cm width x 0.4cm depth; 10.367cm^3 volume. Post  debridement Stage noted as Category/Stage III. Character of Wound/Ulcer Post Debridement requires further debridement. Post procedure Diagnosis Wound #5: Same as Pre-Procedure Plan Wound Cleansing: Wound #10 Right,Dorsal Foot: Clean wound with Normal Saline. Cleanse wound with mild soap and water Wound #11 Right,Plantar Metatarsal head fifth: Clean wound with Normal Saline. Cleanse wound with mild soap and water Wound #13 Sacrum: Clean wound with Normal Saline. Cleanse wound with mild soap and water Wound #14 Left Gluteus: Clean wound with Normal Saline. Cleanse wound with mild soap and water Wound #15 Midline Back: Clean wound with Normal Saline. Cleanse wound with mild soap and water Wound #16 Left,Dorsal Toe Great: Clean wound with Normal Saline. Cleanse wound with mild soap and water Wound #5 Right Calcaneus: Clean wound with Normal Saline. Cleanse wound with mild soap and water Wound #6 Left Calcaneus: Clean wound with Normal Saline. Cleanse wound with mild soap and water Wound #7 Left,Lateral Malleolus: Clean wound with Normal Saline. Cleanse wound with mild soap and water Wound #8 Left,Posterior Lower Leg: Clean wound with Normal Saline. Cleanse wound with mild soap and water Anesthetic: Wound #10 Right,Dorsal Foot: Topical Lidocaine 4% cream applied to wound bed prior to debridement - for clinic use Wound #11 Right,Plantar Metatarsal head fifth: JUDEEN, GERALDS. ([id number]) Topical Lidocaine 4% cream applied to wound bed prior to debridement - for clinic use Wound #13 Sacrum: Topical Lidocaine 4% cream applied to wound bed prior to debridement - for clinic use Wound #14 Left Gluteus: Topical Lidocaine 4% cream applied to wound bed prior to debridement - for clinic use  Wound #15 Midline Back: Topical Lidocaine 4% cream applied to wound bed prior to debridement - for clinic use Wound #16 Left,Dorsal Toe Great: Topical Lidocaine 4% cream applied to wound bed  prior to debridement - for clinic use Wound #5 Right Calcaneus: Topical Lidocaine 4% cream applied to wound bed prior to debridement - for clinic use Wound #6 Left Calcaneus: Topical Lidocaine 4% cream applied to wound bed prior to debridement - for clinic use Wound #7 Left,Lateral Malleolus: Topical Lidocaine 4% cream applied to wound bed prior to debridement - for clinic use Wound #8 Left,Posterior Lower Leg: Topical Lidocaine 4% cream applied to wound bed prior to debridement - for clinic use Skin Barriers/Peri-Wound Care: Wound #10 Right,Dorsal Foot: Skin Prep Wound #11 Right,Plantar Metatarsal head fifth: Skin Prep Wound #13 Sacrum: Skin Prep Wound #14 Left Gluteus: Skin Prep Wound #15 Midline Back: Skin Prep Wound #7 Left,Lateral Malleolus: Skin Prep Wound #8 Left,Posterior Lower Leg: Skin Prep Primary Wound Dressing: Wound #10 Right,Dorsal Foot: Iodoflex Wound #11 Right,Plantar Metatarsal head fifth: Iodoflex Wound #13 Sacrum: Iodoform packing Gauze - 1'4 inch Wound #16 Left,Dorsal Toe Great: Hydrogel Prisma Ag - moisten with hydrogel Wound #5 Right Calcaneus: Iodoflex Wound #6 Left Calcaneus: Iodoflex Wound #7 Left,Lateral Malleolus: Hydrogel Prisma Ag - moisten with saline or hydrogel Wound #8 Left,Posterior Lower Leg: Hydrogel Virgo, Elham L. ([id number]) Prisma Ag - moisten with hydrogel Secondary Dressing: Wound #10 Right,Dorsal Foot: Dry Gauze Boardered Foam Dressing Wound #11 Right,Plantar Metatarsal head fifth: Dry Gauze Boardered Foam Dressing Wound #13 Sacrum: Dry Gauze Boardered Foam Dressing Wound #14 Left Gluteus: Dry Gauze Boardered Foam Dressing Wound #15 Midline Back: Dry Gauze Boardered Foam Dressing Wound #16 Left,Dorsal Toe Great: Kerlix and Coban Foam Wound #5 Right Calcaneus: Dry Gauze Conform/Kerlix Other - allevyn heel cup, stretch netting #4 Wound #6 Left Calcaneus: Dry Gauze Conform/Kerlix Other - allevyn heel  cup, stretch netting #4 Wound #7 Left,Lateral Malleolus: Dry Gauze Boardered Foam Dressing Wound #8 Left,Posterior Lower Leg: Dry Gauze Boardered Foam Dressing Dressing Change Frequency: Wound #10 Right,Dorsal Foot: Change dressing every other day. Wound #11 Right,Plantar Metatarsal head fifth: Change dressing every other day. Wound #13 Sacrum: Change dressing every other day. Wound #14 Left Gluteus: Change dressing every other day. Wound #15 Midline Back: Change dressing every other day. Wound #16 Left,Dorsal Toe Great: Change dressing every other day. Wound #5 Right Calcaneus: Change dressing every other day. Wound #6 Left Calcaneus: Change dressing every other day. DEION, FORGUE L. ([id number]) Wound #7 Left,Lateral Malleolus: Change dressing every other day. Wound #8 Left,Posterior Lower Leg: Change dressing every other day. Follow-up Appointments: Wound #10 Right,Dorsal Foot: Other: - 30 days Wound #11 Right,Plantar Metatarsal head fifth: Other: - 30 days Wound #13 Sacrum: Other: - 30 days Wound #14 Left Gluteus: Other: - 30 days Wound #15 Midline Back: Other: - 30 days Wound #16 Left,Dorsal Toe Great: Other: - 30 days Wound #5 Right Calcaneus: Other: - 30 days Wound #6 Left Calcaneus: Other: - 30 days Wound #7 Left,Lateral Malleolus: Other: - 30 days Wound #8 Left,Posterior Lower Leg: Other: - 30 days Edema Control: Wound #10 Right,Dorsal Foot: Elevate legs to the level of the heart and pump ankles as often as possible Wound #11 Right,Plantar Metatarsal head fifth: Elevate legs to the level of the heart and pump ankles as often as possible Wound #16 Left,Dorsal Toe Great: Elevate legs to the level of the heart and pump ankles as often as possible Wound #5 Right Calcaneus: Elevate legs to  the level of the heart and pump ankles as often as possible Wound #6 Left Calcaneus: Elevate legs to the level of the heart and pump ankles as often as  possible Wound #7 Left,Lateral Malleolus: Elevate legs to the level of the heart and pump ankles as often as possible Wound #8 Left,Posterior Lower Leg: Elevate legs to the level of the heart and pump ankles as often as possible Off-Loading: Wound #10 Right,Dorsal Foot: Turn and reposition every 2 hours Other: - wear sage boots Wound #11 Right,Plantar Metatarsal head fifth: Turn and reposition every 2 hours Other: - wear sage boots Wound #13 Sacrum: Turn and reposition every 2 hours Other: - wear sage boots Wound #14 Left Gluteus: Mcneeley, Alita L. ([id number]) Turn and reposition every 2 hours Other: - wear sage boots Wound #15 Midline Back: Turn and reposition every 2 hours Turn and reposition every 2 hours Other: - wear sage boots Other: - wear sage boots Wound #16 Left,Dorsal Toe Great: Turn and reposition every 2 hours Other: - wear sage boots Wound #5 Right Calcaneus: Turn and reposition every 2 hours Other: - wear sage boots Wound #6 Left Calcaneus: Turn and reposition every 2 hours Other: - wear sage boots Wound #7 Left,Lateral Malleolus: Turn and reposition every 2 hours Other: - wear sage boots Wound #8 Left,Posterior Lower Leg: Turn and reposition every 2 hours Other: - wear sage boots Additional Orders / Instructions: Wound #10 Right,Dorsal Foot: Increase protein intake. Other: - left 5th plantar metatarsal head paint with betadine every day (deep tissue injury) place foam to the top of the left foot for padded protection, add bordered foam dressing to midline back to help with pressure relief Wound #11 Right,Plantar Metatarsal head fifth: Increase protein intake. Other: - left 5th plantar metatarsal head paint with betadine every day (deep tissue injury) place foam to the top of the left foot for padded protection, add bordered foam dressing to midline back to help with pressure relief Wound #13 Sacrum: Increase protein intake. Other: - left 5th  plantar metatarsal head paint with betadine every day (deep tissue injury) place foam to the top of the left foot for padded protection, add bordered foam dressing to midline back to help with pressure relief Wound #14 Left Gluteus: Increase protein intake. Other: - left 5th plantar metatarsal head paint with betadine every day (deep tissue injury) place foam to the top of the left foot for padded protection, add bordered foam dressing to midline back to help with pressure relief Wound #15 Midline Back: Increase protein intake. Other: - left 5th plantar metatarsal head paint with betadine every day (deep tissue injury) place foam to the top of the left foot for padded protection, add bordered foam dressing to midline back to help with pressure relief Wound #16 Left,Dorsal Toe Great: Increase protein intake. Other: - left 5th plantar metatarsal head paint with betadine every day (deep tissue injury) place foam to Faraci, Shambria L. ([id number]) the top of the left foot for padded protection, add bordered foam dressing to midline back to help with pressure relief Wound #5 Right Calcaneus: Increase protein intake. Other: - left 5th plantar metatarsal head paint with betadine every day (deep tissue injury) place foam to the top of the left foot for padded protection, add bordered foam dressing to midline back to help with pressure relief Wound #6 Left Calcaneus: Increase protein intake. Other: - left 5th plantar metatarsal head paint with betadine every day (deep tissue injury) place foam to the top  of the left foot for padded protection, add bordered foam dressing to midline back to help with pressure relief Wound #7 Left,Lateral Malleolus: Increase protein intake. Other: - left 5th plantar metatarsal head paint with betadine every day (deep tissue injury) place foam to the top of the left foot for padded protection, add bordered foam dressing to midline back to help with pressure  relief Wound #8 Left,Posterior Lower Leg: Increase protein intake. Other: - left 5th plantar metatarsal head paint with betadine every day (deep tissue injury) place foam to the top of the left foot for padded protection, add bordered foam dressing to midline back to help with pressure relief Home Health: Wound #10 Right,Dorsal Foot: Continue Home Health Visits - [facility] Nurse may visit PRN to address patient s wound care needs. FACE TO FACE ENCOUNTER: MEDICARE and MEDICAID PATIENTS: I certify that this patient is under my care and that I had a face-to-face encounter that meets the physician face-to-face encounter requirements with this patient on this date. The encounter with the patient was in whole or in part for the following MEDICAL CONDITION: (primary reason for Home Healthcare) MEDICAL NECESSITY: I certify, that based on my findings, NURSING services are a medically necessary home health service. HOME BOUND STATUS: I certify that my clinical findings support that this patient is homebound (i.e., Due to illness or injury, pt requires aid of supportive devices such as crutches, cane, wheelchairs, walkers, the use of special transportation or the assistance of another person to leave their place of residence. There is a normal inability to leave the home and doing so requires considerable and taxing effort. Other absences are for medical reasons / religious services and are infrequent or of short duration when for other reasons). If current dressing causes regression in wound condition, may D/C ordered dressing product/s and apply Normal Saline Moist Dressing daily until next Wound Healing Center / Other MD appointment. Notify Wound Healing Center of regression in wound condition at [phone]. Please direct any NON-WOUND related issues/requests for orders to patient's Primary Care Physician Wound #11 Right,Plantar Metatarsal head fifth: Continue Home Health Visits  - [facility] Nurse may visit PRN to address patient s wound care needs. FACE TO FACE ENCOUNTER: MEDICARE and MEDICAID PATIENTS: I certify that this patient is under my care and that I had a face-to-face encounter that meets the physician face-to-face encounter requirements with this patient on this date. The encounter with the patient was in whole or in part for the following MEDICAL CONDITION: (primary reason for Home Healthcare) MEDICAL NECESSITY: I certify, that based on my findings, NURSING services are a medically necessary home health service. HOME BOUND STATUS: I certify that my clinical findings support that this patient is homebound (i.e., Due to illness or injury, pt requires aid of supportive devices such as crutches, cane, wheelchairs, walkers, the use of special transportation or the assistance of another person to leave their place of residence. There is a Fails, Fantasha L. ([id number]) normal inability to leave the home and doing so requires considerable and taxing effort. Other absences are for medical reasons / religious services and are infrequent or of short duration when for other reasons). If current dressing causes regression in wound condition, may D/C ordered dressing product/s and apply Normal Saline Moist Dressing daily until next Wound Healing Center / Other MD appointment. Notify Wound Healing Center of regression in wound condition at [phone]. Please direct any NON-WOUND related issues/requests for orders to  patient's Primary Care Physician Wound #13 Sacrum: Continue [facility] Health Visits - [facility] Nurse may visit PRN to address patient s wound care needs. FACE TO FACE ENCOUNTER: MEDICARE and MEDICAID PATIENTS: I certify that this patient is under my care and that I had a face-to-face encounter that meets the physician face-to-face encounter requirements with this patient on this date. The encounter with the patient was in  whole or in part for the following MEDICAL CONDITION: (primary reason for Home Healthcare) MEDICAL NECESSITY: I certify, that based on my findings, NURSING services are a medically necessary home health service. HOME BOUND STATUS: I certify that my clinical findings support that this patient is homebound (i.e., Due to illness or injury, pt requires aid of supportive devices such as crutches, cane, wheelchairs, walkers, the use of special transportation or the assistance of another person to leave their place of residence. There is a normal inability to leave the home and doing so requires considerable and taxing effort. Other absences are for medical reasons / religious services and are infrequent or of short duration when for other reasons). If current dressing causes regression in wound condition, may D/C ordered dressing product/s and apply Normal Saline Moist Dressing daily until next Wound Healing Center / Other MD appointment. Notify Wound Healing Center of regression in wound condition at [phone]. Please direct any NON-WOUND related issues/requests for orders to patient's Primary Care Physician Wound #14 Left Gluteus: Continue Home Health Visits - [facility] Nurse may visit PRN to address patient s wound care needs. FACE TO FACE ENCOUNTER: MEDICARE and MEDICAID PATIENTS: I certify that this patient is under my care and that I had a face-to-face encounter that meets the physician face-to-face encounter requirements with this patient on this date. The encounter with the patient was in whole or in part for the following MEDICAL CONDITION: (primary reason for Home Healthcare) MEDICAL NECESSITY: I certify, that based on my findings, NURSING services are a medically necessary home health service. HOME BOUND STATUS: I certify that my clinical findings support that this patient is homebound (i.e., Due to illness or injury, pt requires aid of supportive devices such as  crutches, cane, wheelchairs, walkers, the use of special transportation or the assistance of another person to leave their place of residence. There is a normal inability to leave the home and doing so requires considerable and taxing effort. Other absences are for medical reasons / religious services and are infrequent or of short duration when for other reasons). If current dressing causes regression in wound condition, may D/C ordered dressing product/s and apply Normal Saline Moist Dressing daily until next Wound Healing Center / Other MD appointment. Notify Wound Healing Center of regression in wound condition at [phone]. Please direct any NON-WOUND related issues/requests for orders to patient's Primary Care Physician Wound #15 Midline Back: Continue Home Health Visits - [facility] Nurse may visit PRN to address patient s wound care needs. FACE TO FACE ENCOUNTER: MEDICARE and MEDICAID PATIENTS: I certify that this patient is under my care and that I had a face-to-face encounter that meets the physician face-to-face encounter requirements with this patient on this date. The encounter with the patient was in whole or in part for the following MEDICAL CONDITION: (primary reason for Home Healthcare) MEDICAL NECESSITY: I certify, that based on my findings, NURSING services are a medically necessary home health service. HOME BOUND STATUS: I certify that my clinical findings support that this  patient is homebound (i.e., Due to illness or injury, pt requires aid of supportive devices such as crutches, cane, wheelchairs, walkers, the use of special transportation or the assistance of another person to leave their place of residence. There is a Nierenberg, Marella L. ([id number]) normal inability to leave the home and doing so requires considerable and taxing effort. Other absences are for medical reasons / religious services and are infrequent or of short duration when for other  reasons). If current dressing causes regression in wound condition, may D/C ordered dressing product/s and apply Normal Saline Moist Dressing daily until next Wound Healing Center / Other MD appointment. Notify Wound Healing Center of regression in wound condition at [phone]. Please direct any NON-WOUND related issues/requests for orders to patient's Primary Care Physician Wound #16 Left,Dorsal Toe Great: Continue Home Health Visits - [facility] Nurse may visit PRN to address patient s wound care needs. FACE TO FACE ENCOUNTER: MEDICARE and MEDICAID PATIENTS: I certify that this patient is under my care and that I had a face-to-face encounter that meets the physician face-to-face encounter requirements with this patient on this date. The encounter with the patient was in whole or in part for the following MEDICAL CONDITION: (primary reason for Home Healthcare) MEDICAL NECESSITY: I certify, that based on my findings, NURSING services are a medically necessary home health service. HOME BOUND STATUS: I certify that my clinical findings support that this patient is homebound (i.e., Due to illness or injury, pt requires aid of supportive devices such as crutches, cane, wheelchairs, walkers, the use of special transportation or the assistance of another person to leave their place of residence. There is a normal inability to leave the home and doing so requires considerable and taxing effort. Other absences are for medical reasons / religious services and are infrequent or of short duration when for other reasons). If current dressing causes regression in wound condition, may D/C ordered dressing product/s and apply Normal Saline Moist Dressing daily until next Wound Healing Center / Other MD appointment. Notify Wound Healing Center of regression in wound condition at [phone]. Please direct any NON-WOUND related issues/requests for orders to patient's Primary Care  Physician Wound #5 Right Calcaneus: Continue Home Health Visits - [facility] Nurse may visit PRN to address patient s wound care needs. FACE TO FACE ENCOUNTER: MEDICARE and MEDICAID PATIENTS: I certify that this patient is under my care and that I had a face-to-face encounter that meets the physician face-to-face encounter requirements with this patient on this date. The encounter with the patient was in whole or in part for the following MEDICAL CONDITION: (primary reason for Home Healthcare) MEDICAL NECESSITY: I certify, that based on my findings, NURSING services are a medically necessary home health service. HOME BOUND STATUS: I certify that my clinical findings support that this patient is homebound (i.e., Due to illness or injury, pt requires aid of supportive devices such as crutches, cane, wheelchairs, walkers, the use of special transportation or the assistance of another person to leave their place of residence. There is a normal inability to leave the home and doing so requires considerable and taxing effort. Other absences are for medical reasons / religious services and are infrequent or of short duration when for other reasons). If current dressing causes regression in wound condition, may D/C ordered dressing product/s and apply Normal Saline Moist Dressing daily until next Wound Healing Center / Other MD appointment. Notify Wound Healing Center of regression in  wound condition at [phone]. Please direct any NON-WOUND related issues/requests for orders to patient's Primary Care Physician Wound #6 Left Calcaneus: Continue Home Health Visits - [facility] Nurse may visit PRN to address patient s wound care needs. FACE TO FACE ENCOUNTER: MEDICARE and MEDICAID PATIENTS: I certify that this patient is under my care and that I had a face-to-face encounter that meets the physician face-to-face encounter requirements with this patient on this date.  The encounter with the patient was in whole or in part for the following MEDICAL CONDITION: (primary reason for Home Healthcare) MEDICAL NECESSITY: I certify, that based on my findings, NURSING services are a medically necessary home health service. HOME BOUND STATUS: I certify that my clinical findings support that this patient is homebound (i.e., Due to illness or injury, pt requires aid of supportive devices such as crutches, cane, wheelchairs, walkers, the use of special transportation or the assistance of another person to leave their place of residence. There is a Dauphine, Elysabeth L. ([id number]) normal inability to leave the home and doing so requires considerable and taxing effort. Other absences are for medical reasons / religious services and are infrequent or of short duration when for other reasons). If current dressing causes regression in wound condition, may D/C ordered dressing product/s and apply Normal Saline Moist Dressing daily until next Wound Healing Center / Other MD appointment. Notify Wound Healing Center of regression in wound condition at [phone]. Please direct any NON-WOUND related issues/requests for orders to patient's Primary Care Physician Wound #7 Left,Lateral Malleolus: Continue Home Health Visits - [facility] Nurse may visit PRN to address patient s wound care needs. FACE TO FACE ENCOUNTER: MEDICARE and MEDICAID PATIENTS: I certify that this patient is under my care and that I had a face-to-face encounter that meets the physician face-to-face encounter requirements with this patient on this date. The encounter with the patient was in whole or in part for the following MEDICAL CONDITION: (primary reason for Home Healthcare) MEDICAL NECESSITY: I certify, that based on my findings, NURSING services are a medically necessary home health service. HOME BOUND STATUS: I certify that my clinical findings support that this patient is homebound  (i.e., Due to illness or injury, pt requires aid of supportive devices such as crutches, cane, wheelchairs, walkers, the use of special transportation or the assistance of another person to leave their place of residence. There is a normal inability to leave the home and doing so requires considerable and taxing effort. Other absences are for medical reasons / religious services and are infrequent or of short duration when for other reasons). If current dressing causes regression in wound condition, may D/C ordered dressing product/s and apply Normal Saline Moist Dressing daily until next Wound Healing Center / Other MD appointment. Notify Wound Healing Center of regression in wound condition at [phone]. Please direct any NON-WOUND related issues/requests for orders to patient's Primary Care Physician Wound #8 Left,Posterior Lower Leg: Continue Home Health Visits - [facility] Nurse may visit PRN to address patient s wound care needs. FACE TO FACE ENCOUNTER: MEDICARE and MEDICAID PATIENTS: I certify that this patient is under my care and that I had a face-to-face encounter that meets the physician face-to-face encounter requirements with this patient on this date. The encounter with the patient was in whole or in part for the following MEDICAL CONDITION: (primary reason for Home Healthcare) MEDICAL NECESSITY: I certify, that based on my findings, NURSING services  are a medically necessary home health service. HOME BOUND STATUS: I certify that my clinical findings support that this patient is homebound (i.e., Due to illness or injury, pt requires aid of supportive devices such as crutches, cane, wheelchairs, walkers, the use of special transportation or the assistance of another person to leave their place of residence. There is a normal inability to leave the home and doing so requires considerable and taxing effort. Other absences are for medical reasons / religious  services and are infrequent or of short duration when for other reasons). If current dressing causes regression in wound condition, may D/C ordered dressing product/s and apply Normal Saline Moist Dressing daily until next Wound Healing Center / Other MD appointment. Notify Wound Healing Center of regression in wound condition at [phone]. Please direct any NON-WOUND related issues/requests for orders to patient's Primary Care Physician #1 Iodoflex to all the wounds on the right foot. #2 iodoform packing to the small sinus in the right pelvic area Wilbanks, Kasumi L. ([id number]) #3 prism into the wounds on the left great toe, left heel, left calf #4 follow-up in a month. I view this is a palliative situation I tried to share this with the patient's caregiver but I don't think we are on exactly the same wavelength. Fortunately nothing appears to be exactly infected and nothing is really dramatically worse Electronic Signature(s) Signed: 03/10/2017 5:02:24 PM By: Baltazar Najjar MD Entered By: Baltazar Najjar on 03/10/2017 17:01:22 Kregel, [name] ([id number]) -------------------------------------------------------------------------------- SuperBill Details [name] Date of Service: 03/10/2017 Patient Name: [name] Account Number: [id number] Medical Record Treating RN: [name] [id number] Number: Other Clinician: 17-Jan-1921 (83 y.o. 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 Pressure ulcer of sacral region, stage 2 L89.312  Pressure ulcer of right buttock, stage 2 Facility Procedures CPT4 Code: [id number] Description: 11042 - DEB SUBQ TISSUE 20 SQ CM/< ICD-10 Description Diagnosis L89.613 Pressure ulcer of right heel, stage 3 Modifier: Quantity: 1 CPT4 Code: [id number] Description: 11045 - DEB SUBQ TISS EA ADDL 20CM ICD-10 Description Diagnosis L89.613 Pressure ulcer of right heel, stage 3 Modifier: Quantity: 1 Physician Procedures CPT4 Code: [id number] Description: 11042 - WC PHYS SUBQ TISS 20 SQ CM ICD-10 Description Diagnosis L89.613 Pressure ulcer of right heel, stage 3 Modifier: Quantity: 1 CPT4 Code: [id number] Caster, ELIZABE Description: 11045 - WC PHYS SUBQ TISS EA ADDL 20 CM ICD-10 Description Diagnosis TH L. ([id number]) Modifier: Quantity: 1 Electronic Signature(s) Signed: 03/10/2017 5:02:24 PM By: Baltazar Najjar MD Entered By: Baltazar Najjar on 03/10/2017 17:01:48

## 2017-03-11 NOTE — Progress Notes (Signed)
[name], [name] ([id number]) Visit Report for 03/10/2017 Arrival Information Details [name], [name] Date of Service: 03/10/2017 1:30 PM Patient Name: [name] Account Number: [id number] Medical Record Treating RN: [name] [id number] Number: Other Clinician: Date of Birth/Sex: [birth year] (83 y.o. Female) Treating [name], [name] Primary Care Kelton Bultman: [name] Willmar Stockinger/Extender: G Referring Asusena Sigley: [name] in Treatment: 13 Visit Information History Since Last Visit All ordered tests and consults were No Patient Arrived: Wheel Chair completed: Arrival Time: 13:40 Added or deleted any medications: No Accompanied By: son Any new allergies or adverse No Transfer Assistance: Other reactions: Patient Identification Verified: Yes Had a fall or experienced change in No Secondary Verification Process Yes activities of daily living that may Completed: affect Patient Requires Transmission- No risk of falls: Based Precautions: Signs or symptoms of abuse/neglect No Patient Has Alerts: Yes since last visito Patient Alerts: L ABI non- Hospitalized since last visit: No compressible Has Dressing in Place as Yes R ABI non- Prescribed: compressible Pain Present Now: Unable to Respond Electronic Signature(s) Signed: 03/10/2017 4:59:51 PM By: [name] Entered By: [name] on 03/10/2017 13:41:17 [name] ([id number]) -------------------------------------------------------------------------------- Encounter Discharge Information Details [name] Date of Service: 03/10/2017 1:30 PM Patient Name: [name] Account Number: [id number] Medical Record Treating RN: [name] [id number] Number: Other Clinician: Date of Birth/Sex: January 13, 1921 (83 y.o. Female) Treating [name], [name] Primary Care Kaytlyn Din: [name] Noah Lembke/Extender: G Referring Amandamarie Feggins: [name] in Treatment: 13 Encounter  Discharge Information Items Discharge Condition: Stable Ambulatory Status: Wheelchair Discharge Destination: Home Transportation: Private Auto Accompanied By: son Schedule Follow-up Appointment: Yes Medication Reconciliation completed and provided to Patient/Care No Kaelyn Nauta: Provided on Clinical Summary of Care: 03/10/2017 Form Type Recipient Paper Patient ES Electronic Signature(s) Signed: 03/10/2017 4:21:28 PM By: Gwenlyn Perking Entered By: Gwenlyn Perking on 03/10/2017 15:30:09 [name] ([id number]) -------------------------------------------------------------------------------- Lower Extremity Assessment Details [name] Date of Service: 03/10/2017 1:30 PM Patient Name: [name] Account Number: [id number] Medical Record Treating RN: [name] [id number] Number: Other Clinician: Date of Birth/Sex: 10/24/1920 (83 y.o. Female) Treating [name], [name] Primary Care Cj Beecher: [name] Sylva Overley/Extender: G Referring Haydee Jabbour: [name] in Treatment: 13 Vascular Assessment Pulses: Dorsalis Pedis Palpable: [Left:Yes] [Right:Yes] Posterior Tibial Extremity colors, hair growth, and conditions: Extremity Color: [Left:Mottled] [Right:Mottled] Temperature of Extremity: [Left:Warm] [Right:Warm] Capillary Refill: [Left:> 3 seconds] [Right:> 3 seconds] Toe Nail Assessment Left: Right: Thick: Yes Yes Discolored: Yes Yes Deformed: Yes Yes Improper Length and Hygiene: Yes Yes Electronic Signature(s) Signed: 03/10/2017 4:59:51 PM By: [name] Entered By: [name] on 03/10/2017 14:19:19 [name] ([id number]) -------------------------------------------------------------------------------- Multi Wound Chart Details [name] Date of Service: 03/10/2017 1:30 PM Patient Name: [name] Account Number: [id number] Medical Record Treating RN: [name] [id number] Number: Other Clinician: Date of  Birth/Sex: [birth year] (83 y.o. Female) Treating [name], [name] Primary Care Robie Oats: [name] Kiyanna Biegler/Extender: G Referring Sunaina Ferrando: [name] Weeks in Treatment: 13 Vital Signs Height(in): 69 Pulse(bpm): 90 Weight(lbs): 105 Blood Pressure 151/65 (mmHg): Body Mass Index(BMI): 16 Temperature(F): 98.0 Respiratory Rate 16 (breaths/min): Photos: Wound Location: Right, Dorsal Foot Right, Plantar Metatarsal Right Trochanter head fifth Wounding Event: Pressure Injury Pressure Injury Pressure Injury Primary Etiology: Pressure Ulcer Pressure Ulcer Pressure Ulcer Comorbid History: N/A N/A N/A Date Acquired: 01/25/2017 01/22/2017 11/11/2016 Weeks of Treatment: 6 6 4  Wound Status: Open Open Healed - Epithelialized Clustered Wound: No No No Pending Amputation on No No No Presentation: Measurements L x W x D 1.7x1.7x0.2 0.5x0.4x0.1 0x0x0 (cm) Area (cm) : 2.27  0.157 0 Volume (cm) : 0.454 0.016 0 % Reduction in Area: 3.70% -24.60% 100.00% % Reduction in Volume: -92.40% 57.90% 100.00% Classification: Category/Stage II Category/Stage II Category/Stage II Exudate Amount: N/A N/A N/A Exudate Type: N/A N/A N/A Exudate Color: N/A N/A N/A [name], [name] Wound Margin: N/A N/A N/A Granulation Amount: N/A N/A N/A Granulation Quality: N/A N/A N/A Necrotic Amount: N/A N/A N/A Epithelialization: N/A N/A N/A Debridement: N/A N/A N/A Pain Control: N/A N/A N/A Tissue Debrided: N/A N/A N/A Level: N/A N/A N/A Debridement Area (sq N/A N/A N/A cm): Instrument: N/A N/A N/A Bleeding: N/A N/A N/A Hemostasis Achieved: N/A N/A N/A Procedural Pain: N/A N/A N/A Post Procedural Pain: N/A N/A N/A Debridement Treatment N/A N/A N/A Response: Post Debridement N/A N/A N/A Measurements L x W x D (cm) Post Debridement N/A N/A N/A Volume: (cm) Post Debridement N/A N/A N/A Stage: Periwound Skin Texture: No Abnormalities Noted No Abnormalities Noted No Abnormalities  Noted Periwound Skin No Abnormalities Noted No Abnormalities Noted No Abnormalities Noted Moisture: Periwound Skin Color: No Abnormalities Noted No Abnormalities Noted No Abnormalities Noted Temperature: N/A N/A N/A Tenderness on No No No Palpation: Wound Preparation: N/A N/A N/A Procedures Performed: N/A N/A N/A Wound Number: [id number]  Photos: Wound Location: Sacrum Left Gluteus Back - Midline Wounding Event: Pressure Injury Pressure Injury Pressure Injury Primary Etiology: Pressure Ulcer Pressure Ulcer Pressure Ulcer Comorbid History: N/A Cataracts, Anemia, Cataracts, Anemia, Hypertension, History of Hypertension, History of [place], Takari L. ([id number]) pressure wounds, pressure wounds, Osteoarthritis, Dementia Osteoarthritis, Dementia Date Acquired: 03/20/2016 03/10/2017 03/10/2017 Weeks of Treatment: 4 0 0 Wound Status: Open Open Open Clustered Wound: No No No Pending Amputation on No No No Presentation: Measurements L x W x D 0.2x0.4x0.7 0.5x0.3x0.1 4x0.3x0.1 (cm) Area (cm) : 0.063 0.118 0.942 Volume (cm) : 0.044 0.012 0.094 % Reduction in Area: 77.10% N/A N/A % Reduction in Volume: 84.00% N/A N/A Classification: Category/Stage IV Category/Stage II Category/Stage II Exudate Amount: N/A Large Medium Exudate Type: N/A Serosanguineous Serosanguineous Exudate Color: N/A red, brown red, brown Wound Margin: N/A Flat and Intact Flat and Intact Granulation Amount: N/A Large (67-100%) Large (67-100%) Granulation Quality: N/A Pink Red, Pink Necrotic Amount: N/A Small (1-33%) Small (1-33%) Epithelialization: N/A None None Debridement: N/A N/A N/A Pain Control: N/A N/A N/A Tissue Debrided: N/A N/A N/A Level: N/A N/A N/A Debridement Area (sq N/A N/A N/A cm): Instrument: N/A N/A N/A Bleeding: N/A N/A N/A Hemostasis Achieved: N/A N/A N/A Procedural Pain: N/A N/A N/A Post Procedural Pain: N/A N/A N/A Debridement Treatment N/A N/A N/A Response: Post Debridement N/A N/A  N/A Measurements L x W x D (cm) Post Debridement N/A N/A N/A Volume: (cm) Post Debridement N/A N/A N/A Stage: Periwound Skin Texture: No Abnormalities Noted No Abnormalities Noted No Abnormalities Noted Periwound Skin No Abnormalities Noted No Abnormalities Noted No Abnormalities Noted Moisture: Periwound Skin Color: No Abnormalities Noted No Abnormalities Noted No Abnormalities Noted Temperature: N/A No Abnormality No Abnormality No Yes Yes Everson, Analina L. ([id number]) Tenderness on Palpation: Wound Preparation: N/A Ulcer Cleansing: Ulcer Cleansing: Rinsed/Irrigated with Rinsed/Irrigated with Saline Saline Topical Anesthetic Topical Anesthetic Applied: Other: lidocaine Applied: Other: lidocaine 4% 4% Procedures Performed: N/A N/A N/A Wound Number: [id number]  Photos: Wound Location: Left Toe Great - Dorsal Right Calcaneus Left Calcaneus Wounding Event: Other Lesion Pressure Injury Pressure Injury Primary Etiology: Atypical Pressure Ulcer Pressure Ulcer Comorbid History: Cataracts, Anemia, N/A N/A Hypertension, History of pressure wounds, Osteoarthritis, Dementia Date Acquired: 03/08/2017 10/09/2016 10/09/2016 Weeks of  Treatment: 0 13 13 Wound Status: Open Open Open Clustered Wound: No Yes No Pending Amputation on No Yes Yes Presentation: Measurements L x W x D 0.4x0.4x0.1 5.5x6x0.4 0.6x1.2x0.5 (cm) Area (cm) : 0.126 25.918 0.565 Volume (cm) : 0.013 10.367 0.283 % Reduction in Area: N/A 45.00% 96.70% % Reduction in Volume: N/A -120.00% 83.20% Classification: Partial Thickness Category/Stage III Category/Stage II Exudate Amount: Medium N/A N/A Exudate Type: Serosanguineous N/A N/A Exudate Color: red, brown N/A N/A Wound Margin: Flat and Intact N/A N/A Granulation Amount: Large (67-100%) N/A N/A Granulation Quality: Red, Pink N/A N/A Necrotic Amount: None Present (0%) N/A N/A Epithelialization: None N/A N/A Debridement: N/A Debridement ([id number]  [id number]/A [id number]) Mori, Gaytha L. ([id number]) Pre-procedure N/A 14:26 N/A Verification/Time Out Taken: Pain Control: N/A Lidocaine 4% Topical N/A Solution Tissue Debrided: N/A Fibrin/Slough, Exudates, N/A Subcutaneous Level: N/A Skin/Subcutaneous N/A Tissue Debridement Area (sq N/A 33 N/A cm): Instrument: N/A Blade, Forceps N/A Bleeding: N/A Minimum N/A Hemostasis Achieved: N/A Pressure N/A Procedural Pain: N/A 0 N/A Post Procedural Pain: N/A 0 N/A Debridement Treatment N/A Procedure was tolerated N/A Response: well Post Debridement N/A 5.5x6x0.4 N/A Measurements L x W x D (cm) Post Debridement N/A 10.367 N/A Volume: (cm) Post Debridement N/A Category/Stage III N/A Stage: Periwound Skin Texture: No Abnormalities Noted No Abnormalities Noted No Abnormalities Noted Periwound Skin No Abnormalities Noted No Abnormalities Noted No Abnormalities Noted Moisture: Periwound Skin Color: No Abnormalities Noted No Abnormalities Noted No Abnormalities Noted Temperature: No Abnormality N/A N/A Tenderness on Yes No No Palpation: Wound Preparation: Ulcer Cleansing: N/A N/A Rinsed/Irrigated with Saline Topical Anesthetic Applied: Other: lidocaine 4% Procedures Performed: N/A Debridement N/A Wound Number: [id number] N/A Photos: No Photos N/A Wound Location: Left, Lateral Malleolus Left, Posterior Lower Leg N/A Golightly, Ahnesty L. ([id number]) Wounding Event: Pressure Injury Pressure Injury N/A Primary Etiology: Pressure Ulcer Pressure Ulcer N/A Comorbid History: N/A N/A N/A Date Acquired: 10/09/2016 10/09/2016 N/A Weeks of Treatment: 13 13 N/A Wound Status: Open Open N/A Clustered Wound: No Yes N/A Pending Amputation on Yes Yes N/A Presentation: Measurements L x W x D 0.1x0.1x0.1 2.8x0.7x0.6 N/A (cm) Area (cm) : 0.008 1.539 N/A Volume (cm) : 0.001 0.924 N/A % Reduction in Area: 99.80% 87.20% N/A % Reduction in Volume: 99.90% 87.20% N/A Classification: Category/Stage II  Category/Stage IV N/A Exudate Amount: N/A N/A N/A Exudate Type: N/A N/A N/A Exudate Color: N/A N/A N/A Wound Margin: N/A N/A N/A Granulation Amount: N/A N/A N/A Granulation Quality: N/A N/A N/A Necrotic Amount: N/A N/A N/A Epithelialization: N/A N/A N/A Debridement: N/A N/A N/A Pain Control: N/A N/A N/A Tissue Debrided: N/A N/A N/A Level: N/A N/A N/A Debridement Area (sq N/A N/A N/A cm): Instrument: N/A N/A N/A Bleeding: N/A N/A N/A Hemostasis Achieved: N/A N/A N/A Procedural Pain: N/A N/A N/A Post Procedural Pain: N/A N/A N/A Debridement Treatment N/A N/A N/A Response: Post Debridement N/A N/A N/A Measurements L x W x D (cm) Post Debridement N/A N/A N/A Volume: (cm) Post Debridement N/A N/A N/A Stage: Periwound Skin Texture: No Abnormalities Noted No Abnormalities Noted N/A Periwound Skin No Abnormalities Noted No Abnormalities Noted N/A Moisture: Periwound Skin Color: No Abnormalities Noted No Abnormalities Noted N/A Temperature: N/A N/A N/A Gosse, Annisten L. ([id number]) Tenderness on No No N/A Palpation: Wound Preparation: N/A N/A N/A Procedures Performed: N/A N/A N/A Treatment Notes Wound #10 (Right, Dorsal Foot) 1. Cleansed with: Clean wound with Normal Saline 2. Anesthetic Topical Lidocaine 4% cream to wound bed prior to debridement 4. Dressing Applied: Iodoflex 5.  Secondary Dressing Applied Bordered Foam Dressing Dry Gauze Wound #11 (Right, Plantar Metatarsal head fifth) 1. Cleansed with: Clean wound with Normal Saline 2. Anesthetic Topical Lidocaine 4% cream to wound bed prior to debridement 4. Dressing Applied: Iodoflex 5. Secondary Dressing Applied Bordered Foam Dressing Dry Gauze Wound #13 (Sacrum) 1. Cleansed with: Clean wound with Normal Saline 2. Anesthetic Topical Lidocaine 4% cream to wound bed prior to debridement 3. Peri-wound Care: Skin Prep 4. Dressing Applied: Iodoform packing Gauze 5. Secondary Dressing Applied Bordered  Foam Dressing Dry Gauze Wound #14 (Left Gluteus) 1. Cleansed with: Clean wound with Normal Saline 2. Anesthetic Topical Lidocaine 4% cream to wound bed prior to debridement Grantham, Kamille L. ([id number]) 5. Secondary Dressing Applied Bordered Foam Dressing Dry Gauze Wound #15 (Midline Back) 1. Cleansed with: Clean wound with Normal Saline 2. Anesthetic Topical Lidocaine 4% cream to wound bed prior to debridement 5. Secondary Dressing Applied Bordered Foam Dressing Dry Gauze Wound #16 (Left, Dorsal Toe Great) 1. Cleansed with: Clean wound with Normal Saline 2. Anesthetic Topical Lidocaine 4% cream to wound bed prior to debridement 4. Dressing Applied: Hydrogel Prisma Ag 5. Secondary Dressing Applied Foam Kerlix/Conform 7. Secured with Tape Wound #5 (Right Calcaneus) 1. Cleansed with: Clean wound with Normal Saline 2. Anesthetic Topical Lidocaine 4% cream to wound bed prior to debridement 4. Dressing Applied: Iodoflex 5. Secondary Dressing Applied Dry Gauze Kerlix/Conform Notes heel cups Wound #6 (Left Calcaneus) 1. Cleansed with: Clean wound with Normal Saline 2. Anesthetic Topical Lidocaine 4% cream to wound bed prior to debridement 4. Dressing Applied: ARIBA, LEHNEN. ([id number]) Iodoflex 5. Secondary Dressing Applied Dry Gauze Kerlix/Conform Notes heel cups Wound #7 (Left, Lateral Malleolus) 1. Cleansed with: Clean wound with Normal Saline 2. Anesthetic Topical Lidocaine 4% cream to wound bed prior to debridement 4. Dressing Applied: Hydrogel Prisma Ag 5. Secondary Dressing Applied Bordered Foam Dressing Dry Gauze Wound #8 (Left, Posterior Lower Leg) 1. Cleansed with: Clean wound with Normal Saline 2. Anesthetic Topical Lidocaine 4% cream to wound bed prior to debridement 4. Dressing Applied: Hydrogel Prisma Ag 5. Secondary Dressing Applied Bordered Foam Dressing Dry Gauze Electronic Signature(s) Signed: 03/10/2017 5:02:24 PM  By: Baltazar Najjar MD Entered By: Baltazar Najjar on 03/10/2017 16:54:31 Huseby, Jake [name] ([id number]) -------------------------------------------------------------------------------- Multi-Disciplinary Care Plan Details [name] Date of Service: 03/10/2017 1:30 PM Patient Name: [name] Account Number: [id number] Medical Record Treating RN: [name] [id number] Number: Other Clinician: Date of Birth/Sex: 09/14/1920 (83 y.o. Female) Treating [name], [name] Primary Care Ajia Chadderdon: [name] Kasch Borquez/Extender: G Referring Thaily Hackworth: [name] in Treatment: 13 Active Inactive ` Abuse / Safety / Falls / Self Care Management Nursing Diagnoses: Potential for falls Goals: Patient will not experience any injury related to falls Date Initiated: 12/09/2016 Target Resolution Date: 02/20/2017 Goal Status: Active Interventions: Assess fall risk on admission and as needed Notes: ` Nutrition Nursing Diagnoses: Imbalanced nutrition Potential for alteratiion in Nutrition/Potential for imbalanced nutrition Goals: Patient/caregiver agrees to and verbalizes understanding of need to use nutritional supplements and/or vitamins as prescribed Date Initiated: 12/09/2016 Target Resolution Date: 03/27/2017 Goal Status: Active Interventions: Assess patient nutrition upon admission and as needed per policy Notes: ` Orientation to the Wound Care Program JENSYN, SHAVE ([id number]) Nursing Diagnoses: Knowledge deficit related to the wound healing center program Goals: Patient/caregiver will verbalize understanding of the Wound Healing Center Program Date Initiated: 12/09/2016 Target Resolution Date: 12/26/2016 Goal Status: Active Interventions: Provide education on orientation to the wound center Notes: ` Pain, Acute or Chronic  Nursing Diagnoses: Pain, acute or chronic: actual or potential Potential alteration in comfort, pain Goals: Patient/caregiver  will verbalize adequate pain control between visits Date Initiated: 12/09/2016 Target Resolution Date: 03/27/2017 Goal Status: Active Interventions: Assess comfort goal upon admission Complete pain assessment as per visit requirements Notes: ` Pressure Nursing Diagnoses: Knowledge deficit related to causes and risk factors for pressure ulcer development Knowledge deficit related to management of pressures ulcers Potential for impaired tissue integrity related to pressure, friction, moisture, and shear Goals: Patient/caregiver will verbalize risk factors for pressure ulcer development Date Initiated: 12/09/2016 Target Resolution Date: 03/27/2017 Goal Status: Active Interventions: Assess: immobility, friction, shearing, incontinence upon admission and as needed KYERRA, VARGO. ([id number]) Assess offloading mechanisms upon admission and as needed Notes: ` Wound/Skin Impairment Nursing Diagnoses: Impaired tissue integrity Knowledge deficit related to ulceration/compromised skin integrity Goals: Ulcer/skin breakdown will have a volume reduction of 80% by week 12 Date Initiated: 12/09/2016 Target Resolution Date: 03/20/2017 Goal Status: Active Interventions: Assess patient/caregiver ability to perform ulcer/skin care regimen upon admission and as needed Notes: Electronic Signature(s) Signed: 03/10/2017 4:59:51 PM By: [name] Entered By: [name] on 03/10/2017 14:19:26 Bidwell, Jake [name] ([id number]) -------------------------------------------------------------------------------- Pain Assessment Details [name] Date of Service: 03/10/2017 1:30 PM Patient Name: [name] Account Number: [id number] Medical Record Treating RN: [name] [id number] Number: Other Clinician: Date of Birth/Sex: September 21, 1920 (83 y.o. Female) Treating [name], [name] Primary Care Earlena Werst: [name] Liam Bossman/Extender: G Referring Lillyanne Bradburn: [name]  in Treatment: 13 Active Problems Location of Pain Severity and Description of Pain Patient Has Paino Patient Unable to Respond Site Locations Pain Management and Medication Current Pain Management: Electronic Signature(s) Signed: 03/10/2017 4:59:51 PM By: [name] Entered By: [name] on 03/10/2017 13:41:23 Steinbach, Jake [name] ([id number]) -------------------------------------------------------------------------------- Patient/Caregiver Education Details [name] Date of Service: 03/10/2017 1:30 PM Patient Name: [name] Account Number: [id number] Medical Record Treating RN: [name] [id number] Number: Other Clinician: 02-11-1921 (83 y.o. Treating [name], [name] Date of Birth/Gender: Female) Physician/Extender: G Primary Care Physician: [name] in Treatment: 13 Referring Physician: [name] Education Assessment Education Provided To: Patient and Caregiver son Education Topics Provided Wound/Skin Impairment: Handouts: Other: change dressing as ordered Methods: Demonstration, Explain/Verbal Responses: State content correctly Electronic Signature(s) Signed: 03/10/2017 4:59:51 PM By: [name] Entered By: [name] on 03/10/2017 14:20:49 Cerone, Jake [name] ([id number]) -------------------------------------------------------------------------------- Wound Assessment Details [name] Date of Service: 03/10/2017 1:30 PM Patient Name: [name] Account Number: [id number] Medical Record Treating RN: [name] [id number] Number: Other Clinician: Date of Birth/Sex: 27-Sep-1920 (83 y.o. Female) Treating [name], [name] Primary Care Dontreal Miera: [name] Rhylin Venters/Extender: G Referring Quierra Silverio: [name] Weeks in Treatment: 13 Wound Status Wound Number: 10 Primary Etiology: Pressure Ulcer Wound Location: Right, Dorsal Foot Wound Status: Open Wounding Event: Pressure Injury Date  Acquired: 01/25/2017 Weeks Of Treatment: 6 Clustered Wound: No Photos Photo Uploaded By: [name] on 03/10/2017 16:21:25 Wound Measurements Length: (cm) 1.7 Width: (cm) 1.7 Depth: (cm) 0.2 Area: (cm) 2.27 Volume: (cm) 0.454 % Reduction in Area: 3.7% % Reduction in Volume: -92.4% Wound Description Classification: Category/Stage II Periwound Skin Texture Texture Color No Abnormalities Noted: No No Abnormalities Noted: No Moisture No Abnormalities Noted: No Treatment Notes Chovan, Lesleigh L. ([id number]) Wound #10 (Right, Dorsal Foot) 1. Cleansed with: Clean wound with Normal Saline 2. Anesthetic Topical Lidocaine 4% cream to wound bed prior to debridement 4. Dressing Applied: Iodoflex 5. Secondary Dressing Applied Bordered Foam Dressing Dry Gauze Electronic Signature(s) Signed: 03/10/2017 4:59:51 PM By: Ashok Cordia,  Debra Entered By: [name] on 03/10/2017 13:56:48 Shimer, Jake [name] ([id number]) -------------------------------------------------------------------------------- Wound Assessment Details [name] Date of Service: 03/10/2017 1:30 PM Patient Name: [name] Account Number: [id number] Medical Record Treating RN: [name] [id number] Number: Other Clinician: Date of Birth/Sex: [birth year] (83 y.o. Female) Treating [name], [name] Primary Care Breeley Bischof: [name] Trev Boley/Extender: G Referring Jodey Burbano: [name] Weeks in Treatment: 13 Wound Status Wound Number: 11 Primary Etiology: Pressure Ulcer Wound Location: Right, Plantar Metatarsal head Wound Status: Open fifth Wounding Event: Pressure Injury Date Acquired: 01/22/2017 Weeks Of Treatment: 6 Clustered Wound: No Photos Photo Uploaded By: [name] on 03/10/2017 16:21:26 Wound Measurements Length: (cm) 0.5 Width: (cm) 0.4 Depth: (cm) 0.1 Area: (cm) 0.157 Volume: (cm) 0.016 % Reduction in Area: -24.6% % Reduction in Volume: 57.9% Wound  Description Classification: Category/Stage II Periwound Skin Texture Texture Color No Abnormalities Noted: No No Abnormalities Noted: No Moisture No Abnormalities Noted: No Stannard, Jacoba L. ([id number]) Treatment Notes Wound #11 (Right, Plantar Metatarsal head fifth) 1. Cleansed with: Clean wound with Normal Saline 2. Anesthetic Topical Lidocaine 4% cream to wound bed prior to debridement 4. Dressing Applied: Iodoflex 5. Secondary Dressing Applied Bordered Foam Dressing Dry Gauze Electronic Signature(s) Signed: 03/10/2017 4:59:51 PM By: [name] Entered By: [name] on 03/10/2017 13:56:48 Vilar, Jake [name] ([id number]) -------------------------------------------------------------------------------- Wound Assessment Details [name] Date of Service: 03/10/2017 1:30 PM Patient Name: [name] Account Number: [id number] Medical Record Treating RN: [name] [id number] Number: Other Clinician: Date of Birth/Sex: 12-Sep-1920 (83 y.o. Female) Treating [name], [name] Primary Care Letetia Romanello: [name] Eriyana Sweeten/Extender: G Referring Anays Detore: [name] Weeks in Treatment: 13 Wound Status Wound Number: 12 Primary Etiology: Pressure Ulcer Wound Location: Right Trochanter Wound Status: Healed - Epithelialized Wounding Event: Pressure Injury Date Acquired: 11/11/2016 Weeks Of Treatment: 4 Clustered Wound: No Photos Photo Uploaded By: [name] on 03/10/2017 16:22:06 Wound Measurements Length: (cm) 0 % Reduction in Width: (cm) 0 % Reduction in Depth: (cm) 0 Area: (cm) 0 Volume: (cm) 0 Area: 100% Volume: 100% Wound Description Classification: Category/Stage II Periwound Skin Texture Texture Color No Abnormalities Noted: No No Abnormalities Noted: No Moisture No Abnormalities Noted: No Electronic Signature(s) QUINNETTA, ROEPKE ([id number]) Signed: 03/10/2017 4:59:51 PM By: [name] Entered By:  [name] on 03/10/2017 14:52:16 Castro, Jake [name] ([id number]) -------------------------------------------------------------------------------- Wound Assessment Details [name] Date of Service: 03/10/2017 1:30 PM Patient Name: [name] Account Number: [id number] Medical Record Treating RN: [name] [id number] Number: Other Clinician: Date of Birth/Sex: [birth year] (83 y.o. Female) Treating [name], [name] Primary Care Sharmin Foulk: [name] Jameia Makris/Extender: G Referring Tyrease Vandeberg: [name] Weeks in Treatment: 13 Wound Status Wound Number: 13 Primary Etiology: Pressure Ulcer Wound Location: Sacrum Wound Status: Open Wounding Event: Pressure Injury Date Acquired: 03/20/2016 Weeks Of Treatment: 4 Clustered Wound: No Photos Photo Uploaded By: [name] on 03/10/2017 16:22:07 Wound Measurements Length: (cm) 0.2 Width: (cm) 0.4 Depth: (cm) 0.7 Area: (cm) 0.063 Volume: (cm) 0.044 % Reduction in Area: 77.1% % Reduction in Volume: 84% Wound Description Classification: Category/Stage IV Periwound Skin Texture Texture Color No Abnormalities Noted: No No Abnormalities Noted: No Moisture No Abnormalities Noted: No Treatment Notes Truett, Madisan L. ([id number]) Wound #13 (Sacrum) 1. Cleansed with: Clean wound with Normal Saline 2. Anesthetic Topical Lidocaine 4% cream to wound bed prior to debridement 3. Peri-wound Care: Skin Prep 4. Dressing Applied: Iodoform packing Gauze 5. Secondary Dressing Applied Bordered Foam Dressing Dry Gauze Electronic Signature(s) Signed: 03/10/2017 4:59:51 PM By: [name] Entered By: [name] on  03/10/2017 14:10:38 PANHIA, KARL ([id number]) -------------------------------------------------------------------------------- Wound Assessment Details [name] Date of Service: 03/10/2017 1:30 PM Patient Name: [name] Account Number: [id number] Medical Record  Treating RN: [name] [id number] Number: Other Clinician: Date of Birth/Sex: [birth year] (83 y.o. Female) Treating [name], [name] Primary Care Kizzi Overbey: [name] Trayce Caravello/Extender: G Referring Kalliope Riesen: [name] Weeks in Treatment: 13 Wound Status Wound Number: 14 Primary Pressure Ulcer Etiology: Wound Location: Left Gluteus Wound Open Wounding Event: Pressure Injury Status: Date Acquired: 03/10/2017 Comorbid Cataracts, Anemia, Hypertension, Weeks Of Treatment: 0 History: History of pressure wounds, Clustered Wound: No Osteoarthritis, Dementia Photos Photo Uploaded By: [name] on 03/10/2017 16:22:51 Wound Measurements Length: (cm) 0.5 Width: (cm) 0.3 Depth: (cm) 0.1 Area: (cm) 0.118 Volume: (cm) 0.012 % Reduction in Area: % Reduction in Volume: Epithelialization: None Tunneling: No Undermining: No Wound Description Classification: Category/Stage II Wound Margin: Flat and Intact Exudate Amount: Large Exudate Type: Serosanguineous Exudate Color: red, brown Foul Odor After Cleansing: No Slough/Fibrino Yes Wound Bed Granulation Amount: Large (67-100%) Granulation Quality: Pink Hinds, Nevea L. ([id number]) Necrotic Amount: Small (1-33%) Necrotic Quality: Adherent Slough Periwound Skin Texture Texture Color No Abnormalities Noted: No No Abnormalities Noted: No Moisture Temperature / Pain No Abnormalities Noted: No Temperature: No Abnormality Tenderness on Palpation: Yes Wound Preparation Ulcer Cleansing: Rinsed/Irrigated with Saline Topical Anesthetic Applied: Other: lidocaine 4%, Treatment Notes Wound #14 (Left Gluteus) 1. Cleansed with: Clean wound with Normal Saline 2. Anesthetic Topical Lidocaine 4% cream to wound bed prior to debridement 5. Secondary Dressing Applied Bordered Foam Dressing Dry Gauze Electronic Signature(s) Signed: 03/10/2017 4:59:51 PM By: [name] Entered By: [name] on  03/10/2017 14:15:45 Fedak, Jake [name] ([id number]) -------------------------------------------------------------------------------- Wound Assessment Details [name] Date of Service: 03/10/2017 1:30 PM Patient Name: [name] Account Number: [id number] Medical Record Treating RN: [name] [id number] Number: Other Clinician: Date of Birth/Sex: [birth year] (83 y.o. Female) Treating [name], [name] Primary Care Bentlee Benningfield: [name] Anastaisa Wooding/Extender: G Referring Nixon Sparr: [name] Weeks in Treatment: 13 Wound Status Wound Number: 15 Primary Pressure Ulcer Etiology: Wound Location: Back - Midline Wound Open Wounding Event: Pressure Injury Status: Date Acquired: 03/10/2017 Comorbid Cataracts, Anemia, Hypertension, Weeks Of Treatment: 0 History: History of pressure wounds, Clustered Wound: No Osteoarthritis, Dementia Photos Photo Uploaded By: [name] on 03/10/2017 16:22:52 Wound Measurements Length: (cm) 4 Width: (cm) 0.3 Depth: (cm) 0.1 Area: (cm) 0.942 Volume: (cm) 0.094 % Reduction in Area: % Reduction in Volume: Epithelialization: None Tunneling: No Undermining: No Wound Description Classification: Category/Stage II Wound Margin: Flat and Intact Exudate Amount: Medium Exudate Type: Serosanguineous Exudate Color: red, brown Foul Odor After Cleansing: No Slough/Fibrino Yes Wound Bed Granulation Amount: Large (67-100%) Granulation Quality: Red, Pink Faucett, Preeya L. ([id number]) Necrotic Amount: Small (1-33%) Necrotic Quality: Adherent Slough Periwound Skin Texture Texture Color No Abnormalities Noted: No No Abnormalities Noted: No Moisture Temperature / Pain No Abnormalities Noted: No Temperature: No Abnormality Tenderness on Palpation: Yes Wound Preparation Ulcer Cleansing: Rinsed/Irrigated with Saline Topical Anesthetic Applied: Other: lidocaine 4%, Treatment Notes Wound #15 (Midline Back) 1. Cleansed  with: Clean wound with Normal Saline 2. Anesthetic Topical Lidocaine 4% cream to wound bed prior to debridement 5. Secondary Dressing Applied Bordered Foam Dressing Dry Gauze Electronic Signature(s) Signed: 03/10/2017 4:59:51 PM By: [name] Entered By: [name] on 03/10/2017 14:16:55 Luscher, Jake [name] ([id number]) -------------------------------------------------------------------------------- Wound Assessment Details [name] Date of Service: 03/10/2017 1:30 PM Patient Name: [name] Account Number: [id number] Medical Record Treating RN: [name] [id number] Number: Other Clinician: Date of  Birth/Sex: 1920-07-29 (83 y.o. Female) Treating [name], [name] Primary Care Avir Deruiter: [name] Stanlee Roehrig/Extender: G Referring Marshae Azam: [name] Weeks in Treatment: 13 Wound Status Wound Number: 16 Primary Atypical Etiology: Wound Location: Left Toe Great - Dorsal Wound Open Wounding Event: Other Lesion Status: Date Acquired: 03/08/2017 Comorbid Cataracts, Anemia, Hypertension, Weeks Of Treatment: 0 History: History of pressure wounds, Clustered Wound: No Osteoarthritis, Dementia Photos Photo Uploaded By: [name] on 03/10/2017 16:23:38 Wound Measurements Length: (cm) 0.4 Width: (cm) 0.4 Depth: (cm) 0.1 Area: (cm) 0.126 Volume: (cm) 0.013 % Reduction in Area: % Reduction in Volume: Epithelialization: None Tunneling: No Undermining: No Wound Description Classification: Partial Thickness Foul Odor Afte Wound Margin: Flat and Intact Slough/Fibrino Exudate Amount: Medium Exudate Type: Serosanguineous Exudate Color: red, brown r Cleansing: No No Wound Bed Granulation Amount: Large (67-100%) Granulation Quality: Red, Pink Arenivas, Kyrra L. ([id number]) Necrotic Amount: None Present (0%) Periwound Skin Texture Texture Color No Abnormalities Noted: No No Abnormalities Noted: No Moisture Temperature / Pain No  Abnormalities Noted: No Temperature: No Abnormality Tenderness on Palpation: Yes Wound Preparation Ulcer Cleansing: Rinsed/Irrigated with Saline Topical Anesthetic Applied: Other: lidocaine 4%, Treatment Notes Wound #16 (Left, Dorsal Toe Great) 1. Cleansed with: Clean wound with Normal Saline 2. Anesthetic Topical Lidocaine 4% cream to wound bed prior to debridement 4. Dressing Applied: Hydrogel Prisma Ag 5. Secondary Dressing Applied Foam Kerlix/Conform 7. Secured with Secretary/administrator) Signed: 03/10/2017 4:59:51 PM By: [name] Entered By: [name] on 03/10/2017 14:18:34 Stucke, Jake [name] ([id number]) -------------------------------------------------------------------------------- Wound Assessment Details [name] Date of Service: 03/10/2017 1:30 PM Patient Name: [name] Account Number: [id number] Medical Record Treating RN: [name] [id number] Number: Other Clinician: Date of Birth/Sex: [birth year] (83 y.o. Female) Treating [name], [name] Primary Care Ula Couvillon: [name] Jinx Gilden/Extender: G Referring Lukah Goswami: [name] Weeks in Treatment: 13 Wound Status Wound Number: 5 Primary Etiology: Pressure Ulcer Wound Location: Right Calcaneus Wound Status: Open Wounding Event: Pressure Injury Date Acquired: 10/09/2016 Weeks Of Treatment: 13 Clustered Wound: Yes Pending Amputation On Presentation Photos Photo Uploaded By: [name] on 03/10/2017 16:23:38 Wound Measurements Length: (cm) 5.5 Width: (cm) 6 Depth: (cm) 0.4 Area: (cm) 25.918 Volume: (cm) 10.367 % Reduction in Area: 45% % Reduction in Volume: -120% Wound Description Classification: Category/Stage III Periwound Skin Texture Texture Color No Abnormalities Noted: No No Abnormalities Noted: No Moisture No Abnormalities Noted: No Will, Herma L. ([id number]) Treatment Notes Wound #5 (Right Calcaneus) 1. Cleansed with: Clean  wound with Normal Saline 2. Anesthetic Topical Lidocaine 4% cream to wound bed prior to debridement 4. Dressing Applied: Iodoflex 5. Secondary Dressing Applied Dry Gauze Kerlix/Conform Notes heel cups Electronic Signature(s) Signed: 03/10/2017 4:59:51 PM By: [name] Entered By: [name] on 03/10/2017 13:57:59 Pharr, Jake [name] ([id number]) -------------------------------------------------------------------------------- Wound Assessment Details [name] Date of Service: 03/10/2017 1:30 PM Patient Name: [name] Account Number: [id number] Medical Record Treating RN: [name] [id number] Number: Other Clinician: Date of Birth/Sex: 1920-07-24 (83 y.o. Female) Treating [name], [name] Primary Care Nathaneal Sommers: [name] Raidyn Breiner/Extender: G Referring Akari Crysler: [name] Weeks in Treatment: 13 Wound Status Wound Number: 6 Primary Etiology: Pressure Ulcer Wound Location: Left Calcaneus Wound Status: Open Wounding Event: Pressure Injury Date Acquired: 10/09/2016 Weeks Of Treatment: 13 Clustered Wound: No Pending Amputation On Presentation Photos Photo Uploaded By: [name] on 03/10/2017 16:24:57 Wound Measurements Length: (cm) 0.6 Width: (cm) 1.2 Depth: (cm) 0.5 Area: (cm) 0.565 Volume: (cm) 0.283 % Reduction in Area: 96.7% % Reduction in Volume: 83.2% Wound Description Classification: Category/Stage II  Periwound Skin Texture Texture Color No Abnormalities Noted: No No Abnormalities Noted: No Moisture No Abnormalities Noted: No Marinello, Patsie L. ([id number]) Treatment Notes Wound #6 (Left Calcaneus) 1. Cleansed with: Clean wound with Normal Saline 2. Anesthetic Topical Lidocaine 4% cream to wound bed prior to debridement 4. Dressing Applied: Iodoflex 5. Secondary Dressing Applied Dry Gauze Kerlix/Conform Notes heel cups Electronic Signature(s) Signed: 03/10/2017 4:59:51 PM By: [name] Entered By: [name] on 03/10/2017 13:59:52 Kirchner, Jake [name] ([id number]) -------------------------------------------------------------------------------- Wound Assessment Details [name] Date of Service: 03/10/2017 1:30 PM Patient Name: [name] Account Number: [id number] Medical Record Treating RN: [name] [id number] Number: Other Clinician: Date of Birth/Sex: 04-04-1921 (83 y.o. Female) Treating [name], [name] Primary Care Areil Ottey: [name] Betrice Wanat/Extender: G Referring Aradhya Shellenbarger: [name] Weeks in Treatment: 13 Wound Status Wound Number: 7 Primary Etiology: Pressure Ulcer Wound Location: Left, Lateral Malleolus Wound Status: Open Wounding Event: Pressure Injury Date Acquired: 10/09/2016 Weeks Of Treatment: 13 Clustered Wound: No Pending Amputation On Presentation Wound Measurements Length: (cm) 0.1 Width: (cm) 0.1 Depth: (cm) 0.1 Area: (cm) 0.008 Volume: (cm) 0.001 % Reduction in Area: 99.8% % Reduction in Volume: 99.9% Wound Description Classification: Category/Stage II Periwound Skin Texture Texture Color No Abnormalities Noted: No No Abnormalities Noted: No Moisture No Abnormalities Noted: No Treatment Notes Wound #7 (Left, Lateral Malleolus) 1. Cleansed with: Clean wound with Normal Saline 2. Anesthetic Topical Lidocaine 4% cream to wound bed prior to debridement 4. Dressing Applied: Hydrogel Prisma Ag 5. Secondary Dressing Applied Bordered Foam Dressing ADLEAN, HARDEMAN ([id number]) Dry Gauze Electronic Signature(s) Signed: 03/10/2017 4:59:51 PM By: [name] Entered By: [name] on 03/10/2017 13:59:52 Lucchetti, Jake [name] ([id number]) -------------------------------------------------------------------------------- Wound Assessment Details [name] Date of Service: 03/10/2017 1:30 PM Patient Name: [name] Account Number: [id number] Medical Record Treating RN:  [name] [id number] Number: Other Clinician: Date of Birth/Sex: 1921-05-20 (83 y.o. Female) Treating [name], [name] Primary Care Tiye Huwe: [name] Karman Veney/Extender: G Referring Caro Brundidge: [name] Weeks in Treatment: 13 Wound Status Wound Number: 8 Primary Etiology: Pressure Ulcer Wound Location: Left, Posterior Lower Leg Wound Status: Open Wounding Event: Pressure Injury Date Acquired: 10/09/2016 Weeks Of Treatment: 13 Clustered Wound: Yes Pending Amputation On Presentation Photos Photo Uploaded By: [name] on 03/10/2017 16:25:31 Wound Measurements Length: (cm) 2.8 Width: (cm) 0.7 Depth: (cm) 0.6 Area: (cm) 1.539 Volume: (cm) 0.924 % Reduction in Area: 87.2% % Reduction in Volume: 87.2% Wound Description Classification: Category/Stage IV Periwound Skin Texture Texture Color No Abnormalities Noted: No No Abnormalities Noted: No Moisture No Abnormalities Noted: No Kissling, Julita L. ([id number]) Treatment Notes Wound #8 (Left, Posterior Lower Leg) 1. Cleansed with: Clean wound with Normal Saline 2. Anesthetic Topical Lidocaine 4% cream to wound bed prior to debridement 4. Dressing Applied: Hydrogel Prisma Ag 5. Secondary Dressing Applied Bordered Foam Dressing Dry Gauze Electronic Signature(s) Signed: 03/10/2017 4:59:51 PM By: [name] Entered By: [name] on 03/10/2017 14:01:08 Simi, Jake [name] ([id number]) -------------------------------------------------------------------------------- Vitals Details [name] Date of Service: 03/10/2017 1:30 PM Patient Name: [name] Account Number: [id number] Medical Record Treating RN: [name] [id number] Number: Other Clinician: Date of Birth/Sex: [birth year] (83 y.o. Female) Treating [name], [name] Primary Care Rendell Thivierge: [name] Kiyara Bouffard/Extender: G Referring Vung Kush: [name] Weeks in Treatment: 13 Vital Signs Time Taken:  13:44 Temperature (F): 98.0 Height (in): 69 Pulse (bpm): 90 Weight (lbs): 105 Respiratory Rate (breaths/min): 16 Body Mass Index (BMI): 15.5 Blood Pressure (mmHg): 151/65 Reference Range: 80 - 120 mg / dl Notes Temp taken  axillary. Electronic Signature(s) Signed: 03/10/2017 4:59:51 PM By: [name] Entered By: [name] on 03/10/2017 13:46:28

## 2017-04-07 ENCOUNTER — Encounter: Payer: Medicare Other | Attending: Internal Medicine | Admitting: Internal Medicine

## 2017-04-07 DIAGNOSIS — L89623 Pressure ulcer of left heel, stage 3: Secondary | ICD-10-CM | POA: Insufficient documentation

## 2017-04-07 DIAGNOSIS — G301 Alzheimer's disease with late onset: Secondary | ICD-10-CM | POA: Insufficient documentation

## 2017-04-07 DIAGNOSIS — L89613 Pressure ulcer of right heel, stage 3: Secondary | ICD-10-CM | POA: Insufficient documentation

## 2017-04-07 DIAGNOSIS — L97223 Non-pressure chronic ulcer of left calf with necrosis of muscle: Secondary | ICD-10-CM | POA: Diagnosis not present

## 2017-04-07 DIAGNOSIS — L89523 Pressure ulcer of left ankle, stage 3: Secondary | ICD-10-CM | POA: Diagnosis not present

## 2017-04-08 ENCOUNTER — Other Ambulatory Visit
Admission: RE | Admit: 2017-04-08 | Discharge: 2017-04-08 | Disposition: A | Discharge status: Home / Self Care | Payer: Medicare Other | Source: Ambulatory Visit | Attending: Internal Medicine | Admitting: Internal Medicine

## 2017-04-08 DIAGNOSIS — X58XXXA Exposure to other specified factors, initial encounter: Secondary | ICD-10-CM | POA: Diagnosis not present

## 2017-04-08 DIAGNOSIS — S91301A Unspecified open wound, right foot, initial encounter: Secondary | ICD-10-CM | POA: Diagnosis present

## 2017-04-09 NOTE — Progress Notes (Signed)
SHERRY, BLACKARD ([id number]) Visit Report for 04/07/2017 Arrival Information Details MONIA, TIMMERS Date of Service: 04/07/2017 1:30 PM Patient Name: [name] Account Number: [id number] Medical Record Treating RN: [name] [id number] Number: Other Clinician: Date of Birth/Sex: 11/23/1920 (83 y.o. Female) Treating [name], [name] Primary Care Jone Panebianco: [name] Hatem Cull/Extender: G Referring Sholom Dulude: [name] in Treatment: 17 Visit Information History Since Last Visit All ordered tests and consults were No Patient Arrived: Wheel Chair completed: Arrival Time: 13:30 Added or deleted any medications: No Accompanied By: son Any new allergies or adverse No Transfer Assistance: Other reactions: Patient Identification Verified: Yes Had a fall or experienced change in No Secondary Verification Process Yes activities of daily living that may Completed: affect Patient Requires Transmission- No risk of falls: Based Precautions: Signs or symptoms of abuse/neglect No Patient Has Alerts: Yes since last visito Patient Alerts: L ABI non- Hospitalized since last visit: No compressible Has Dressing in Place as Yes R ABI non- Prescribed: compressible Pain Present Now: Unable to Respond Electronic Signature(s) Signed: 04/07/2017 5:51:06 PM By: [name] Entered By: [name] on 04/07/2017 13:30:30 Ailes, [name] ([id number]) -------------------------------------------------------------------------------- Encounter Discharge Information Details [name] Date of Service: 04/07/2017 1:30 PM Patient Name: [name] Account Number: [id number] Medical Record Treating RN: [name] [id number] Number: Other Clinician: Date of Birth/Sex: October 27, 1920 (83 y.o. Female) Treating [name], [name] Primary Care Siren Porrata: [name] Keen Ewalt/Extender: G Referring Viridiana Spaid: [name] in Treatment: 17 Encounter  Discharge Information Items Discharge Condition: Stable Ambulatory Status: Wheelchair Discharge Destination: Home Transportation: Private Auto Accompanied By: son Schedule Follow-up Appointment: Yes Medication Reconciliation completed and provided to Patient/Care No Aveah Castell: Provided on Clinical Summary of Care: 04/07/2017 Form Type Recipient Paper Patient ES Electronic Signature(s) Signed: 04/07/2017 5:51:06 PM By: [name] Entered By: [name] on 04/07/2017 17:17:19 Iturralde, [name] ([id number]) -------------------------------------------------------------------------------- Lower Extremity Assessment Details [name] Date of Service: 04/07/2017 1:30 PM Patient Name: [name] Account Number: [id number] Medical Record Treating RN: [name] [id number] Number: Other Clinician: Date of Birth/Sex: 07/07/1921 (83 y.o. Female) Treating [name], [name] Primary Care Deasiah Hagberg: [name] Armanii Pressnell/Extender: G Referring Marketta Valadez: [name] in Treatment: 17 Vascular Assessment Pulses: Dorsalis Pedis Palpable: [Left:Yes] [Right:Yes] Posterior Tibial Extremity colors, hair growth, and conditions: Extremity Color: [Left:Mottled] [Right:Mottled] Temperature of Extremity: [Left:Warm] [Right:Warm] Capillary Refill: [Left:> 3 seconds] [Right:> 3 seconds] Toe Nail Assessment Left: Right: Thick: Yes Yes Discolored: Yes Yes Deformed: Yes Yes Improper Length and Hygiene: Yes Yes Electronic Signature(s) Signed: 04/07/2017 5:51:06 PM By: [name] Entered By: [name] on 04/07/2017 14:06:53 Friesenhahn, [name] ([id number]) -------------------------------------------------------------------------------- Multi Wound Chart Details [name] Date of Service: 04/07/2017 1:30 PM Patient Name: [name] Account Number: [id number] Medical Record Treating RN: [name] [id number] Number: Other Clinician: Date  of Birth/Sex: 1921/05/16 (83 y.o. Female) Treating [name], [name] Primary Care Vivaan Helseth: [name] Armend Hochstatter/Extender: G Referring Esiah Bazinet: [name] Weeks in Treatment: 17 Vital Signs Height(in): 69 Pulse(bpm): 86 Weight(lbs): 105 Blood Pressure 155/65 (mmHg): Body Mass Index(BMI): 16 Temperature(F): 97.7 Respiratory Rate 16 (breaths/min): Photos: [10:No Photos] [11:No Photos] [13:No Photos] Wound Location: [10:Right Foot - Dorsal] [11:Right Metatarsal head fifth Sacrum - Plantar] Wounding Event: [10:Pressure Injury] [11:Pressure Injury] [13:Pressure Injury] Primary Etiology: [10:Pressure Ulcer] [11:Pressure Ulcer] [13:Pressure Ulcer] Comorbid History: [10:Cataracts, Anemia, Hypertension, History of pressure wounds, Osteoarthritis, Dementia] [11:Cataracts, Anemia, Hypertension, History of Hypertension, History of pressure wounds, Osteoarthritis, Dementia Osteoarthritis, Dementia]  [13:Cataracts, Anemia, pressure wounds,] Date Acquired: [10:01/25/2017] [11:01/22/2017] [13:03/20/2016] Weeks of Treatment: [10:10] [11:10] [13:8] Wound  Status: [10:Open] [11:Open] [13:Open] Clustered Wound: [10:No] [11:No] [13:No] Pending Amputation on No [11:No] [13:No] Presentation: Measurements L x W x D 1.5x2x0.4 [11:0.3x0.3x0.3] [13:0.5x0.3x0.8] (cm) Area (cm) : [10:2.356] [11:0.071] [13:0.118] Volume (cm) : [10:0.942] [11:0.021] [13:0.094] % Reduction in Area: [10:0.00%] [11:43.70%] [13:57.10%] % Reduction in Volume: -299.20% [11:44.70%] [13:65.80%] Classification: [10:Category/Stage IV] [11:Category/Stage II] [13:Category/Stage IV] Exudate Amount: [10:Large] [11:Large] [13:Large] Exudate Type: [10:Serosanguineous] [11:Serous] [13:Serous] Exudate Color: [10:red, brown] [11:amber] [13:amber] Foul Odor After [10:No] [11:No] [13:No] Cleansing: [10:N/A] [11:N/A] [13:N/A] Odor Anticipated Due to Product Use: Wound Margin: Distinct, outline attached Distinct, outline attached  Distinct, outline attached Granulation Amount: Large (67-100%) None Present (0%) Medium (34-66%) Granulation Quality: Red N/A Red Necrotic Amount: Small (1-33%) Large (67-100%) Medium (34-66%) Necrotic Tissue: Adherent Slough Adherent [employer] Exposed Structures: Tendon: Yes N/A N/A Muscle: Yes Epithelialization: None None N/A Periwound Skin Texture: No Abnormalities Noted No Abnormalities Noted No Abnormalities Noted Periwound Skin Maceration: Yes No Abnormalities Noted No Abnormalities Noted Moisture: Periwound Skin Color: No Abnormalities Noted No Abnormalities Noted No Abnormalities Noted Temperature: No Abnormality No Abnormality No Abnormality Tenderness on Yes Yes Yes Palpation: Wound Preparation: Ulcer Cleansing: Ulcer Cleansing: Ulcer Cleansing: Rinsed/Irrigated with Rinsed/Irrigated with Rinsed/Irrigated with Saline Saline Saline Topical Anesthetic Topical Anesthetic Topical Anesthetic Applied: Other: lidocaine Applied: Other: lidocaine Applied: Other: lidocaine 4% 4% 4% Wound Number: Photos: No Photos No Photos No Photos Wound Location: Left Gluteus Back - Midline Left Toe Great - Dorsal Wounding Event: Pressure Injury Pressure Injury Other Lesion Primary Etiology: Pressure Ulcer Pressure Ulcer Atypical Comorbid History: Cataracts, Anemia, Cataracts, Anemia, Cataracts, Anemia, Hypertension, History of Hypertension, History of Hypertension, History of pressure wounds, pressure wounds, pressure wounds, Osteoarthritis, Dementia Osteoarthritis, Dementia Osteoarthritis, Dementia Date Acquired: 03/10/2017 03/10/2017 03/08/2017 Weeks of Treatment: Wound Status: Open Open Open Clustered Wound: No No No Pending Amputation on No No No Presentation: Measurements L x W x D 0.2x0.2x0.1 0.8x0.5x0.2 0x0x0 (cm) Area (cm) : 0.031 0.314 0 Volume (cm) : 0.003 0.063 0 % Reduction in Area: 73.70% 66.70% 100.00% % Reduction in Volume: 75.00% 33.00%  100.00% Classification: Category/Stage II Category/Stage II Partial Thickness Exudate Amount: Large Large None Present Exudate Type: Serosanguineous Serosanguineous N/A Maberry, Shantele L. ([id number]) Exudate Color: red, brown red, brown N/A Foul Odor After No No No Cleansing: Odor Anticipated Due to N/A N/A N/A Product Use: Wound Margin: Flat and Intact Flat and Intact Flat and Intact Granulation Amount: Large (67-100%) Large (67-100%) None Present (0%) Granulation Quality: Pink Red, Pink N/A Necrotic Amount: Small (1-33%) Small (1-33%) None Present (0%) Necrotic Tissue: Adherent Slough Adherent Slough N/A Exposed Structures: N/A N/A N/A Epithelialization: None None Large (67-100%) Periwound Skin Texture: No Abnormalities Noted No Abnormalities Noted No Abnormalities Noted Periwound Skin No Abnormalities Noted No Abnormalities Noted No Abnormalities Noted Moisture: Periwound Skin Color: No Abnormalities Noted No Abnormalities Noted No Abnormalities Noted Temperature: No Abnormality No Abnormality No Abnormality Tenderness on Yes Yes Yes Palpation: Wound Preparation: Ulcer Cleansing: Ulcer Cleansing: Ulcer Cleansing: Rinsed/Irrigated with Rinsed/Irrigated with Rinsed/Irrigated with Saline Saline Saline Topical Anesthetic Topical Anesthetic Topical Anesthetic Applied: Other: lidocaine Applied: Other: lidocaine Applied: None 4% 4% Wound Number: Photos: No Photos No Photos No Photos Wound Location: Right Calcaneus Left Calcaneus Left Malleolus - Lateral Wounding Event: Pressure Injury Pressure Injury Pressure Injury Primary Etiology: Pressure Ulcer Pressure Ulcer Pressure Ulcer Comorbid History: Cataracts, Anemia, Cataracts, Anemia, Cataracts, Anemia, Hypertension, History of Hypertension, History of Hypertension, History of pressure wounds, pressure wounds, pressure  wounds, Osteoarthritis, Dementia Osteoarthritis, Dementia Osteoarthritis, Dementia Date Acquired:  10/09/2016 10/09/2016 10/09/2016 Weeks of Treatment: 17 17 17  Wound Status: Open Open Open Clustered Wound: Yes No No Pending Amputation on Yes Yes Yes Presentation: Measurements L x W x D 5.5x5.4x0.7 1.3x1.8x0.6 0x0x0 (cm) Area (cm) : 23.326 1.838 0 Volume (cm) : 16.328 1.103 0 % Reduction in Area: 50.50% 89.10% 100.00% % Reduction in Volume: -246.50% 34.70% 100.00% Classification: Category/Stage IV Category/Stage II Category/Stage II Fariss, Izza L. ([id number]) Exudate Amount: Large Large None Present Exudate Type: Serosanguineous Serosanguineous N/A Exudate Color: red, brown red, brown N/A Foul Odor After Yes No No Cleansing: Odor Anticipated Due to No N/A N/A Product Use: Wound Margin: Distinct, outline attached Distinct, outline attached Flat and Intact Granulation Amount: Medium (34-66%) Small (1-33%) None Present (0%) Granulation Quality: Red Red N/A Necrotic Amount: Medium (34-66%) Large (67-100%) None Present (0%) Necrotic Tissue: Eschar, Adherent Slough Eschar, Adherent Slough N/A Exposed Structures: Fascia: Yes N/A N/A Fat Layer (Subcutaneous Tissue) Exposed: Yes Tendon: Yes Muscle: Yes Bone: Yes Epithelialization: None N/A Large (67-100%) Periwound Skin Texture: No Abnormalities Noted No Abnormalities Noted No Abnormalities Noted Periwound Skin Maceration: Yes Maceration: Yes No Abnormalities Noted Moisture: Periwound Skin Color: No Abnormalities Noted No Abnormalities Noted No Abnormalities Noted Temperature: No Abnormality No Abnormality No Abnormality Tenderness on Yes Yes No Palpation: Wound Preparation: Ulcer Cleansing: Ulcer Cleansing: Ulcer Cleansing: Rinsed/Irrigated with Rinsed/Irrigated with Rinsed/Irrigated with Saline Saline Saline Topical Anesthetic Topical Anesthetic Applied: Other: lidocaine Applied: Other: lidocaine 4% 4% Wound Number: 8 N/A N/A Photos: No Photos N/A N/A Wound Location: Left Lower Leg - Posterior N/A N/A Wounding  Event: Pressure Injury N/A N/A Primary Etiology: Pressure Ulcer N/A N/A Comorbid History: Cataracts, Anemia, N/A N/A Hypertension, History of pressure wounds, Osteoarthritis, Dementia Date Acquired: 10/09/2016 N/A N/A Weeks of Treatment: 17 N/A N/A Wound Status: Open N/A N/A Clustered Wound: Yes N/A N/A Pending Amputation on Yes N/A N/A Presentation: PERL, FOLMAR ([id number]) Measurements L x W x D 1.3x0.2x0.5 N/A N/A (cm) Area (cm) : 0.204 N/A N/A Volume (cm) : 0.102 N/A N/A % Reduction in Area: 98.30% N/A N/A % Reduction in Volume: 98.60% N/A N/A Classification: Category/Stage IV N/A N/A Exudate Amount: Large N/A N/A Exudate Type: Serosanguineous N/A N/A Exudate Color: red, brown N/A N/A Foul Odor After No N/A N/A Cleansing: Odor Anticipated Due to N/A N/A N/A Product Use: Wound Margin: Distinct, outline attached N/A N/A Granulation Amount: Large (67-100%) N/A N/A Granulation Quality: Red N/A N/A Necrotic Amount: Small (1-33%) N/A N/A Necrotic Tissue: Adherent Slough N/A N/A Exposed Structures: N/A N/A N/A Epithelialization: None N/A N/A Periwound Skin Texture: No Abnormalities Noted N/A N/A Periwound Skin No Abnormalities Noted N/A N/A Moisture: Periwound Skin Color: No Abnormalities Noted N/A N/A Temperature: No Abnormality N/A N/A Tenderness on Yes N/A N/A Palpation: Wound Preparation: Ulcer Cleansing: N/A N/A Rinsed/Irrigated with Saline Topical Anesthetic Applied: Other: lidocaine 4% Treatment Notes Wound #10 (Right, Dorsal Foot) 1. Cleansed with: Clean wound with Normal Saline 2. Anesthetic Topical Lidocaine 4% cream to wound bed prior to debridement 4. Dressing Applied: Aquacel Ag 5. Secondary Dressing Applied Bordered Foam Dressing Dry Gauze Kerlix/Conform Overholser, Ainara L. ([id number]) 7. Secured with Tape Wound #11 (Right, Plantar Metatarsal head fifth) 1. Cleansed with: Clean wound with Normal Saline 2. Anesthetic Topical  Lidocaine 4% cream to wound bed prior to debridement 4. Dressing Applied: Aquacel Ag 5. Secondary Dressing Applied Bordered Foam Dressing Dry Gauze Kerlix/Conform 7. Secured with Tape Wound #13 (Sacrum) 1. Cleansed  with: Clean wound with Normal Saline 2. Anesthetic Topical Lidocaine 4% cream to wound bed prior to debridement 3. Peri-wound Care: Skin Prep 4. Dressing Applied: Iodoform packing Gauze 5. Secondary Dressing Applied Bordered Foam Dressing Dry Gauze Wound #14 (Left Gluteus) 1. Cleansed with: Clean wound with Normal Saline 2. Anesthetic Topical Lidocaine 4% cream to wound bed prior to debridement 3. Peri-wound Care: Skin Prep 4. Dressing Applied: Aquacel Ag 5. Secondary Dressing Applied Bordered Foam Dressing Dry Gauze Wound #15 (Midline Back) 1. Cleansed with: Clean wound with Normal Saline Shober, Sintia L. ([id number]) 2. Anesthetic Topical Lidocaine 4% cream to wound bed prior to debridement 3. Peri-wound Care: Skin Prep 4. Dressing Applied: Saline moistened guaze 5. Secondary Dressing Applied Bordered Foam Dressing Dry Gauze Wound #5 (Right Calcaneus) 1. Cleansed with: Clean wound with Normal Saline 2. Anesthetic Topical Lidocaine 4% cream to wound bed prior to debridement 4. Dressing Applied: Aquacel Ag 5. Secondary Dressing Applied Dry Gauze Kerlix/Conform 7. Secured with Tape Notes heel cup, netting Wound #6 (Left Calcaneus) 1. Cleansed with: Clean wound with Normal Saline 2. Anesthetic Topical Lidocaine 4% cream to wound bed prior to debridement 4. Dressing Applied: Aquacel Ag 5. Secondary Dressing Applied Dry Gauze Kerlix/Conform 7. Secured with Tape Notes heel cup, netting Wound #8 (Left, Posterior Lower Leg) 1. Cleansed with: Clean wound with Normal Saline 2. Anesthetic Topical Lidocaine 4% cream to wound bed prior to debridement 4. Dressing Applied: KEILANY, BURNETTE L. ([id number]) Aquacel Ag 5. Secondary  Dressing Applied Bordered Foam Dressing Dry Gauze Kerlix/Conform 7. Secured with Secretary/administrator) Signed: 04/07/2017 5:33:43 PM By: Baltazar Najjar MD Entered By: Baltazar Najjar on 04/07/2017 17:24:07 Duzan, [name] ([id number]) -------------------------------------------------------------------------------- Multi-Disciplinary Care Plan Details [name] Date of Service: 04/07/2017 1:30 PM Patient Name: [name] Account Number: [id number] Medical Record Treating RN: [name] [id number] Number: Other Clinician: Date of Birth/Sex: 11-22-1920 (83 y.o. Female) Treating [name], [name] Primary Care Davinci Glotfelty: [name] Zadie Deemer/Extender: G Referring Zyree Traynham: [name] in Treatment: 17 Active Inactive ` Abuse / Safety / Falls / Self Care Management Nursing Diagnoses: Potential for falls Goals: Patient will not experience any injury related to falls Date Initiated: 12/09/2016 Target Resolution Date: 02/20/2017 Goal Status: Active Interventions: Assess fall risk on admission and as needed Notes: ` Nutrition Nursing Diagnoses: Imbalanced nutrition Potential for alteratiion in Nutrition/Potential for imbalanced nutrition Goals: Patient/caregiver agrees to and verbalizes understanding of need to use nutritional supplements and/or vitamins as prescribed Date Initiated: 12/09/2016 Target Resolution Date: 03/27/2017 Goal Status: Active Interventions: Assess patient nutrition upon admission and as needed per policy Notes: ` Orientation to the Wound Care Program TORRI, MICHALSKI ([id number]) Nursing Diagnoses: Knowledge deficit related to the wound healing center program Goals: Patient/caregiver will verbalize understanding of the Wound Healing Center Program Date Initiated: 12/09/2016 Target Resolution Date: 12/26/2016 Goal Status: Active Interventions: Provide education on orientation to the wound center Notes: ` Pain,  Acute or Chronic Nursing Diagnoses: Pain, acute or chronic: actual or potential Potential alteration in comfort, pain Goals: Patient/caregiver will verbalize adequate pain control between visits Date Initiated: 12/09/2016 Target Resolution Date: 03/27/2017 Goal Status: Active Interventions: Assess comfort goal upon admission Complete pain assessment as per visit requirements Notes: ` Pressure Nursing Diagnoses: Knowledge deficit related to causes and risk factors for pressure ulcer development Knowledge deficit related to management of pressures ulcers Potential for impaired tissue integrity related to pressure, friction, moisture, and shear Goals: Patient/caregiver will verbalize risk factors for pressure ulcer development Date Initiated: 12/09/2016 Target Resolution  Date: 03/27/2017 Goal Status: Active Interventions: Assess: immobility, friction, shearing, incontinence upon admission and as needed ANGELI, DEMILIO. ([id number]) Assess offloading mechanisms upon admission and as needed Notes: ` Wound/Skin Impairment Nursing Diagnoses: Impaired tissue integrity Knowledge deficit related to ulceration/compromised skin integrity Goals: Ulcer/skin breakdown will have a volume reduction of 80% by week 12 Date Initiated: 12/09/2016 Target Resolution Date: 03/20/2017 Goal Status: Active Interventions: Assess patient/caregiver ability to perform ulcer/skin care regimen upon admission and as needed Notes: Electronic Signature(s) Signed: 04/07/2017 5:51:06 PM By: [name] Entered By: [name] on 04/07/2017 14:07:00 Babineau, [name] ([id number]) -------------------------------------------------------------------------------- Pain Assessment Details [name] Date of Service: 04/07/2017 1:30 PM Patient Name: [name] Account Number: [id number] Medical Record Treating RN: [name] [id number] Number: Other Clinician: Date of Birth/Sex: [birth year]  (83 y.o. Female) Treating [name], [name] Primary Care Dejour Vos: [name] Jolan Upchurch/Extender: G Referring Wilmer Santillo: [name] in Treatment: 17 Active Problems Location of Pain Severity and Description of Pain Patient Has Paino Patient Unable to Respond Site Locations Pain Management and Medication Current Pain Management: Electronic Signature(s) Signed: 04/07/2017 5:51:06 PM By: [name] Entered By: [name] on 04/07/2017 13:30:36 Bunn, [name] ([id number]) -------------------------------------------------------------------------------- Patient/Caregiver Education Details [name] Date of Service: 04/07/2017 1:30 PM Patient Name: [name] Account Number: [id number] Medical Record Treating RN: [name] [id number] Number: Other Clinician: 1921-06-02 (83 y.o. Treating [name], [name] Date of Birth/Gender: Female) Physician/Extender: G Primary Care Physician: [name] in Treatment: 17 Referring Physician: [name] Education Assessment Education Provided To: Patient and Caregiver son Education Topics Provided Wound/Skin Impairment: Handouts: Other: change dressing as ordered Methods: Demonstration, Explain/Verbal Responses: State content correctly Electronic Signature(s) Signed: 04/07/2017 5:51:06 PM By: [name] Entered By: [name] on 04/07/2017 17:17:31 Belcher, [name] ([id number]) -------------------------------------------------------------------------------- Wound Assessment Details [name] Date of Service: 04/07/2017 1:30 PM Patient Name: [name] Account Number: [id number] Medical Record Treating RN: [name] [id number] Number: Other Clinician: Date of Birth/Sex: 04-Feb-1921 (83 y.o. Female) Treating [name], [name] Primary Care Vyom [name]: [name] Aaliyana Fredericks/Extender: G Referring Jasmina Gendron: [name] Weeks in Treatment: 17 Wound  Status Wound Number: 10 Primary Pressure Ulcer Etiology: Wound Location: Right Foot - Dorsal Wound Open Wounding Event: Pressure Injury Status: Date Acquired: 01/25/2017 Comorbid Cataracts, Anemia, Hypertension, Weeks Of Treatment: 10 History: History of pressure wounds, Clustered Wound: No Osteoarthritis, Dementia Photos Photo Uploaded By: [name] on 04/07/2017 17:46:23 Wound Measurements Length: (cm) 1.5 Width: (cm) 2 Depth: (cm) 0.4 Area: (cm) 2.356 Volume: (cm) 0.942 % Reduction in Area: 0% % Reduction in Volume: -299.2% Epithelialization: None Tunneling: No Undermining: No Wound Description Classification: Category/Stage IV Foul Odor Aft Wound Margin: Distinct, outline attached Slough/Fibrin Exudate Amount: Large Exudate Type: Serosanguineous Exudate Color: red, brown er Cleansing: No o Yes Wound Bed Granulation Amount: Large (67-100%) Exposed Structure Granulation Quality: Red Tendon Exposed: Yes Carles, Simcha L. ([id number]) Necrotic Amount: Small (1-33%) Muscle Exposed: Yes Necrotic Quality: Adherent Slough Necrosis of Muscle: No Periwound Skin Texture Texture Color No Abnormalities Noted: No No Abnormalities Noted: No Moisture Temperature / Pain No Abnormalities Noted: No Temperature: No Abnormality Maceration: Yes Tenderness on Palpation: Yes Wound Preparation Ulcer Cleansing: Rinsed/Irrigated with Saline Topical Anesthetic Applied: Other: lidocaine 4%, Treatment Notes Wound #10 (Right, Dorsal Foot) 1. Cleansed with: Clean wound with Normal Saline 2. Anesthetic Topical Lidocaine 4% cream to wound bed prior to debridement 4. Dressing Applied: Aquacel Ag 5. Secondary Dressing Applied Bordered Foam Dressing Dry Gauze Kerlix/Conform 7. Secured with Secretary/administrator) Signed: 04/07/2017 5:51:06  PM By: [name] Entered By: [name] on 04/07/2017 14:01:35 Aye, [name]  ([id number]) -------------------------------------------------------------------------------- Wound Assessment Details [name] Date of Service: 04/07/2017 1:30 PM Patient Name: [name] Account Number: [id number] Medical Record Treating RN: [name] [id number] Number: Other Clinician: Date of Birth/Sex: [birth year] (83 y.o. Female) Treating [name], [name] Primary Care Temperance Kelemen: [name] Gillie Crisci/Extender: G Referring Jakai Onofre: [name] Weeks in Treatment: 17 Wound Status Wound Number: 11 Primary Pressure Ulcer Etiology: Wound Location: Right Metatarsal head fifth - Plantar Wound Open Status: Wounding Event: Pressure Injury Comorbid Cataracts, Anemia, Hypertension, Date Acquired: 01/22/2017 History: History of pressure wounds, Weeks Of Treatment: 10 Osteoarthritis, Dementia Clustered Wound: No Photos Photo Uploaded By: [name] on 04/07/2017 17:46:23 Wound Measurements Length: (cm) 0.3 Width: (cm) 0.3 Depth: (cm) 0.3 Area: (cm) 0.071 Volume: (cm) 0.021 % Reduction in Area: 43.7% % Reduction in Volume: 44.7% Epithelialization: None Tunneling: No Undermining: No Wound Description Classification: Category/Stage II Foul Odor Aft Wound Margin: Distinct, outline attached Slough/Fibrin Exudate Amount: Large Exudate Type: Serous Exudate Color: amber er Cleansing: No o Yes Wound Bed Granulation Amount: None Present (0%) Necrotic Amount: Large (67-100%) Goga, Zamiah L. ([id number]) Necrotic Quality: Adherent Slough Periwound Skin Texture Texture Color No Abnormalities Noted: No No Abnormalities Noted: No Moisture Temperature / Pain No Abnormalities Noted: No Temperature: No Abnormality Tenderness on Palpation: Yes Wound Preparation Ulcer Cleansing: Rinsed/Irrigated with Saline Topical Anesthetic Applied: Other: lidocaine 4%, Treatment Notes Wound #11 (Right, Plantar Metatarsal head fifth) 1. Cleansed with: Clean  wound with Normal Saline 2. Anesthetic Topical Lidocaine 4% cream to wound bed prior to debridement 4. Dressing Applied: Aquacel Ag 5. Secondary Dressing Applied Bordered Foam Dressing Dry Gauze Kerlix/Conform 7. Secured with Secretary/administrator) Signed: 04/07/2017 5:51:06 PM By: [name] Entered By: [name] on 04/07/2017 14:02:20 Bunney, [name] ([id number]) -------------------------------------------------------------------------------- Wound Assessment Details [name] Date of Service: 04/07/2017 1:30 PM Patient Name: [name] Account Number: [id number] Medical Record Treating RN: [name] [id number] Number: Other Clinician: Date of Birth/Sex: 21-Jan-1921 (83 y.o. Female) Treating [name], [name] Primary Care Allicia Culley: [name] Netha Dafoe/Extender: G Referring Douglass Dunshee: [name] Weeks in Treatment: 17 Wound Status Wound Number: 13 Primary Pressure Ulcer Etiology: Wound Location: Sacrum Wound Open Wounding Event: Pressure Injury Status: Date Acquired: 03/20/2016 Comorbid Cataracts, Anemia, Hypertension, Weeks Of Treatment: 8 History: History of pressure wounds, Clustered Wound: No Osteoarthritis, Dementia Photos Photo Uploaded By: [name] on 04/07/2017 17:46:59 Wound Measurements Length: (cm) 0.5 Width: (cm) 0.3 Depth: (cm) 0.8 Area: (cm) 0.118 Volume: (cm) 0.094 % Reduction in Area: 57.1% % Reduction in Volume: 65.8% Tunneling: No Undermining: No Wound Description Classification: Category/Stage IV Foul Odor Aft Wound Margin: Distinct, outline attached Slough/Fibrin Exudate Amount: Large Exudate Type: Serous Exudate Color: amber er Cleansing: No o Yes Wound Bed Granulation Amount: Medium (34-66%) Granulation Quality: Red Seith, Nitisha L. ([id number]) Necrotic Amount: Medium (34-66%) Necrotic Quality: Adherent Slough Periwound Skin Texture Texture Color No Abnormalities Noted:  No No Abnormalities Noted: No Moisture Temperature / Pain No Abnormalities Noted: No Temperature: No Abnormality Tenderness on Palpation: Yes Wound Preparation Ulcer Cleansing: Rinsed/Irrigated with Saline Topical Anesthetic Applied: Other: lidocaine 4%, Treatment Notes Wound #13 (Sacrum) 1. Cleansed with: Clean wound with Normal Saline 2. Anesthetic Topical Lidocaine 4% cream to wound bed prior to debridement 3. Peri-wound Care: Skin Prep 4. Dressing Applied: Iodoform packing Gauze 5. Secondary Dressing Applied Bordered Foam Dressing Dry Gauze Electronic Signature(s) Signed: 04/07/2017 5:51:06 PM By: [name] Entered By: [name] on 04/07/2017 14:02:58  BREEONNA, MONE ([id number]) -------------------------------------------------------------------------------- Wound Assessment Details [name] Date of Service: 04/07/2017 1:30 PM Patient Name: [name] Account Number: [id number] Medical Record Treating RN: [name] [id number] Number: Other Clinician: Date of Birth/Sex: 12/27/1920 (83 y.o. Female) Treating [name], [name] Primary Care Ulysses Alper: [name] Nawaf Strange/Extender: G Referring Derrius Furtick: [name] Weeks in Treatment: 17 Wound Status Wound Number: 14 Primary Pressure Ulcer Etiology: Wound Location: Left Gluteus Wound Open Wounding Event: Pressure Injury Status: Date Acquired: 03/10/2017 Comorbid Cataracts, Anemia, Hypertension, Weeks Of Treatment: 4 History: History of pressure wounds, Clustered Wound: No Osteoarthritis, Dementia Photos Photo Uploaded By: [name] on 04/07/2017 17:46:59 Wound Measurements Length: (cm) 0.2 Width: (cm) 0.2 Depth: (cm) 0.1 Area: (cm) 0.031 Volume: (cm) 0.003 % Reduction in Area: 73.7% % Reduction in Volume: 75% Epithelialization: None Tunneling: No Undermining: No Wound Description Classification: Category/Stage II Wound Margin: Flat and Intact Exudate  Amount: Large Exudate Type: Serosanguineous Exudate Color: red, brown Foul Odor After Cleansing: No Slough/Fibrino Yes Wound Bed Granulation Amount: Large (67-100%) Granulation Quality: Pink Olesen, Daphyne L. ([id number]) Necrotic Amount: Small (1-33%) Necrotic Quality: Adherent Slough Periwound Skin Texture Texture Color No Abnormalities Noted: No No Abnormalities Noted: No Moisture Temperature / Pain No Abnormalities Noted: No Temperature: No Abnormality Tenderness on Palpation: Yes Wound Preparation Ulcer Cleansing: Rinsed/Irrigated with Saline Topical Anesthetic Applied: Other: lidocaine 4%, Treatment Notes Wound #14 (Left Gluteus) 1. Cleansed with: Clean wound with Normal Saline 2. Anesthetic Topical Lidocaine 4% cream to wound bed prior to debridement 3. Peri-wound Care: Skin Prep 4. Dressing Applied: Aquacel Ag 5. Secondary Dressing Applied Bordered Foam Dressing Dry Gauze Electronic Signature(s) Signed: 04/07/2017 5:51:06 PM By: [name] Entered By: [name] on 04/07/2017 14:03:38 Galentine, [name] ([id number]) -------------------------------------------------------------------------------- Wound Assessment Details [name] Date of Service: 04/07/2017 1:30 PM Patient Name: [name] Account Number: [id number] Medical Record Treating RN: [name] [id number] Number: Other Clinician: Date of Birth/Sex: 1920/09/11 (83 y.o. Female) Treating [name], [name] Primary Care Trenia Tennyson: [name] Alvah Gilder/Extender: G Referring Hendrick Pavich: [name] Weeks in Treatment: 17 Wound Status Wound Number: 15 Primary Pressure Ulcer Etiology: Wound Location: Back - Midline Wound Open Wounding Event: Pressure Injury Status: Date Acquired: 03/10/2017 Comorbid Cataracts, Anemia, Hypertension, Weeks Of Treatment: 4 History: History of pressure wounds, Clustered Wound: No Osteoarthritis, Dementia Photos Photo Uploaded By:  [name] on 04/07/2017 17:47:26 Wound Measurements Length: (cm) 0.8 Width: (cm) 0.5 Depth: (cm) 0.2 Area: (cm) 0.314 Volume: (cm) 0.063 % Reduction in Area: 66.7% % Reduction in Volume: 33% Epithelialization: None Tunneling: No Undermining: No Wound Description Classification: Category/Stage II Wound Margin: Flat and Intact Exudate Amount: Large Exudate Type: Serosanguineous Exudate Color: red, brown Foul Odor After Cleansing: No Slough/Fibrino Yes Wound Bed Granulation Amount: Large (67-100%) Granulation Quality: Red, Pink Placeres, Liberty L. ([id number]) Necrotic Amount: Small (1-33%) Necrotic Quality: Adherent Slough Periwound Skin Texture Texture Color No Abnormalities Noted: No No Abnormalities Noted: No Moisture Temperature / Pain No Abnormalities Noted: No Temperature: No Abnormality Tenderness on Palpation: Yes Wound Preparation Ulcer Cleansing: Rinsed/Irrigated with Saline Topical Anesthetic Applied: Other: lidocaine 4%, Treatment Notes Wound #15 (Midline Back) 1. Cleansed with: Clean wound with Normal Saline 2. Anesthetic Topical Lidocaine 4% cream to wound bed prior to debridement 3. Peri-wound Care: Skin Prep 4. Dressing Applied: Saline moistened guaze 5. Secondary Dressing Applied Bordered Foam Dressing Dry Gauze Electronic Signature(s) Signed: 04/07/2017 5:51:06 PM By: [name] Entered By: [name] on 04/07/2017 14:04:02 Grinnell, [name] -------------------------------------------------------------------------------- Wound Assessment Details Lienau, Maeci Date of Service:  04/07/2017 1:30 PM Patient Name: [name] Account Number: [id number] Medical Record Treating RN: [name] [id number] Number: Other Clinician: Date of Birth/Sex: 17-Dec-1920 (83 y.o. Female) Treating [name], [name] Primary Care Sheryn Aldaz: [name] Shelia Magallon/Extender: G Referring Linley Moxley: [name] Weeks  in Treatment: 17 Wound Status Wound Number: 16 Primary Atypical Etiology: Wound Location: Left Toe Great - Dorsal Wound Open Wounding Event: Other Lesion Status: Date Acquired: 03/08/2017 Comorbid Cataracts, Anemia, Hypertension, Weeks Of Treatment: 4 History: History of pressure wounds, Clustered Wound: No Osteoarthritis, Dementia Photos Photo Uploaded By: [name] on 04/07/2017 17:48:13 Wound Measurements Length: (cm) 0 % Reduction in Width: (cm) 0 % Reduction in Depth: (cm) 0 Epithelializati Area: (cm) 0 Tunneling: Volume: (cm) 0 Undermining: Area: 100% Volume: 100% on: Large (67-100%) No No Wound Description Classification: Partial Thickness Foul Odor After Wound Margin: Flat and Intact Slough/Fibrino Exudate Amount: None Present Cleansing: No No Wound Bed Granulation Amount: None Present (0%) Necrotic Amount: None Present (0%) Periwound Skin Texture Hincapie, Chinelo L. ([id number]) Texture Color No Abnormalities Noted: No No Abnormalities Noted: No Moisture Temperature / Pain No Abnormalities Noted: No Temperature: No Abnormality Tenderness on Palpation: Yes Wound Preparation Ulcer Cleansing: Rinsed/Irrigated with Saline Topical Anesthetic Applied: None Electronic Signature(s) Signed: 04/07/2017 5:51:06 PM By: [name] Entered By: [name] on 04/07/2017 13:59:23 Rucinski, [name] ([id number]) -------------------------------------------------------------------------------- Wound Assessment Details [name] Date of Service: 04/07/2017 1:30 PM Patient Name: [name] Account Number: [id number] Medical Record Treating RN: [name] [id number] Number: Other Clinician: Date of Birth/Sex: June 14, 1921 (83 y.o. Female) Treating [name], [name] Primary Care Auburn Hester: [name] Sophie Quiles/Extender: G Referring Tamitha Norell: [name] Weeks in Treatment: 17 Wound Status Wound Number: 5 Primary Pressure  Ulcer Etiology: Wound Location: Right Calcaneus Wound Open Wounding Event: Pressure Injury Status: Date Acquired: 10/09/2016 Comorbid Cataracts, Anemia, Hypertension, Weeks Of Treatment: 17 History: History of pressure wounds, Clustered Wound: Yes Osteoarthritis, Dementia Pending Amputation On Presentation Photos Photo Uploaded By: [name] on 04/07/2017 17:48:13 Wound Measurements Length: (cm) 5.5 Width: (cm) 5.4 Depth: (cm) 0.7 Area: (cm) 23.326 Volume: (cm) 16.328 % Reduction in Area: 50.5% % Reduction in Volume: -246.5% Epithelialization: None Tunneling: No Undermining: No Wound Description Classification: Category/Stage IV Foul Odor Aft Wound Margin: Distinct, outline attached Due to Produc Exudate Amount: Large Slough/Fibrin Exudate Type: Serosanguineous Exudate Color: red, brown er Cleansing: Yes t Use: No o Yes Wound Bed Granulation Amount: Medium (34-66%) Exposed Structure Granulation Quality: Red Fascia Exposed: Yes Ferrando, Deanie L. ([id number]) Necrotic Amount: Medium (34-66%) Fat Layer (Subcutaneous Tissue) Exposed: Yes Necrotic Quality: Eschar, Adherent Slough Tendon Exposed: Yes Muscle Exposed: Yes Necrosis of Muscle: No Bone Exposed: Yes Periwound Skin Texture Texture Color No Abnormalities Noted: No No Abnormalities Noted: No Moisture Temperature / Pain No Abnormalities Noted: No Temperature: No Abnormality Maceration: Yes Tenderness on Palpation: Yes Wound Preparation Ulcer Cleansing: Rinsed/Irrigated with Saline Topical Anesthetic Applied: Other: lidocaine 4%, Treatment Notes Wound #5 (Right Calcaneus) 1. Cleansed with: Clean wound with Normal Saline 2. Anesthetic Topical Lidocaine 4% cream to wound bed prior to debridement 4. Dressing Applied: Aquacel Ag 5. Secondary Dressing Applied Dry Gauze Kerlix/Conform 7. Secured with Tape Notes heel cup, netting Electronic Signature(s) Signed: 04/07/2017 5:51:06 PM By:  [name] Entered By: [name] on 04/07/2017 14:04:55 Lansberry, [name] ([id number]) -------------------------------------------------------------------------------- Wound Assessment Details [name] Date of Service: 04/07/2017 1:30 PM Patient Name: [name] Account Number: [id number] Medical Record Treating RN: [name] [id number] Number: Other Clinician: Date of Birth/Sex: [birth year] (83 y.o. Female) Treating [name],  [name] Primary Care Cyra Spader: [name] Delonda Coley/Extender: G Referring Quadry Kampa: [name] in Treatment: 17 Wound Status Wound Number: 6 Primary Pressure Ulcer Etiology: Wound Location: Left Calcaneus Wound Open Wounding Event: Pressure Injury Status: Date Acquired: 10/09/2016 Comorbid Cataracts, Anemia, Hypertension, Weeks Of Treatment: 17 History: History of pressure wounds, Clustered Wound: No Osteoarthritis, Dementia Pending Amputation On Presentation Photos Photo Uploaded By: [name] on 04/07/2017 17:48:48 Wound Measurements Length: (cm) 1.3 Width: (cm) 1.8 Depth: (cm) 0.6 Area: (cm) 1.838 Volume: (cm) 1.103 % Reduction in Area: 89.1% % Reduction in Volume: 34.7% Tunneling: No Undermining: No Wound Description Classification: Category/Stage II Foul Odor Aft Wound Margin: Distinct, outline attached Slough/Fibrin Exudate Amount: Large Exudate Type: Serosanguineous Exudate Color: red, brown er Cleansing: No o Yes Wound Bed Granulation Amount: Small (1-33%) Granulation Quality: Red Salome, Artisha L. ([id number]) Necrotic Amount: Large (67-100%) Necrotic Quality: Eschar, Adherent Slough Periwound Skin Texture Texture Color No Abnormalities Noted: No No Abnormalities Noted: No Moisture Temperature / Pain No Abnormalities Noted: No Temperature: No Abnormality Maceration: Yes Tenderness on Palpation: Yes Wound Preparation Ulcer Cleansing: Rinsed/Irrigated with  Saline Topical Anesthetic Applied: Other: lidocaine 4%, Treatment Notes Wound #6 (Left Calcaneus) 1. Cleansed with: Clean wound with Normal Saline 2. Anesthetic Topical Lidocaine 4% cream to wound bed prior to debridement 4. Dressing Applied: Aquacel Ag 5. Secondary Dressing Applied Dry Gauze Kerlix/Conform 7. Secured with Tape Notes heel cup, netting Electronic Signature(s) Signed: 04/07/2017 5:51:06 PM By: [name] Entered By: [name] on 04/07/2017 14:05:39 Wease, [name] ([id number]) -------------------------------------------------------------------------------- Wound Assessment Details [name] Date of Service: 04/07/2017 1:30 PM Patient Name: [name] Account Number: [id number] Medical Record Treating RN: [name] [id number] Number: Other Clinician: Date of Birth/Sex: October 12, 1920 (83 y.o. Female) Treating [name], [name] Primary Care Colton Tassin: [name] Doloris Servantes/Extender: G Referring Durwood Dittus: [name] Weeks in Treatment: 17 Wound Status Wound Number: 7 Primary Pressure Ulcer Etiology: Wound Location: Left Malleolus - Lateral Wound Open Wounding Event: Pressure Injury Status: Date Acquired: 10/09/2016 Comorbid Cataracts, Anemia, Hypertension, Weeks Of Treatment: 17 History: History of pressure wounds, Clustered Wound: No Osteoarthritis, Dementia Pending Amputation On Presentation Photos Photo Uploaded By: [name] on 04/07/2017 17:48:49 Wound Measurements Length: (cm) 0 % Reduction in Width: (cm) 0 % Reduction in Depth: (cm) 0 Epithelializat Area: (cm) 0 Tunneling: Volume: (cm) 0 Undermining: Area: 100% Volume: 100% ion: Large (67-100%) No No Wound Description Classification: Category/Stage II Foul Odor Afte Wound Margin: Flat and Intact Slough/Fibrino Exudate Amount: None Present r Cleansing: No No Wound Bed Granulation Amount: None Present (0%) Necrotic Amount: None Present  (0%) Periwound Skin Texture Herman, Deeann L. ([id number]) Texture Color No Abnormalities Noted: No No Abnormalities Noted: No Moisture Temperature / Pain No Abnormalities Noted: No Temperature: No Abnormality Wound Preparation Ulcer Cleansing: Rinsed/Irrigated with Saline Electronic Signature(s) Signed: 04/07/2017 5:51:06 PM By: [name] Entered By: [name] on 04/07/2017 14:00:10 Carden, [name] ([id number]) -------------------------------------------------------------------------------- Wound Assessment Details [name] Date of Service: 04/07/2017 1:30 PM Patient Name: [name] Account Number: [id number] Medical Record Treating RN: [name] [id number] Number: Other Clinician: Date of Birth/Sex: 01/23/1921 (83 y.o. Female) Treating [name], [name] Primary Care Addaleigh Nicholls: [name] Milania Haubner/Extender: G Referring Teofil Maniaci: [name] Weeks in Treatment: 17 Wound Status Wound Number: 8 Primary Pressure Ulcer Etiology: Wound Location: Left Lower Leg - Posterior Wound Open Wounding Event: Pressure Injury Status: Date Acquired: 10/09/2016 Comorbid Cataracts, Anemia, Hypertension, Weeks Of Treatment: 17 History: History of pressure wounds, Clustered Wound: Yes Osteoarthritis, Dementia Pending Amputation On Presentation Photos  Photo Uploaded By: [name] on 04/07/2017 17:49:36 Wound Measurements Length: (cm) 1.3 Width: (cm) 0.2 Depth: (cm) 0.5 Area: (cm) 0.204 Volume: (cm) 0.102 % Reduction in Area: 98.3% % Reduction in Volume: 98.6% Epithelialization: None Tunneling: No Undermining: No Wound Description Classification: Category/Stage IV Foul Odor Aft Wound Margin: Distinct, outline attached Slough/Fibrin Exudate Amount: Large Exudate Type: Serosanguineous Exudate Color: red, brown er Cleansing: No o Yes Wound Bed Granulation Amount: Large (67-100%) Granulation Quality: Red Duren, Laverle L.  ([id number]) Necrotic Amount: Small (1-33%) Necrotic Quality: Adherent Slough Periwound Skin Texture Texture Color No Abnormalities Noted: No No Abnormalities Noted: No Moisture Temperature / Pain No Abnormalities Noted: No Temperature: No Abnormality Tenderness on Palpation: Yes Wound Preparation Ulcer Cleansing: Rinsed/Irrigated with Saline Topical Anesthetic Applied: Other: lidocaine 4%, Treatment Notes Wound #8 (Left, Posterior Lower Leg) 1. Cleansed with: Clean wound with Normal Saline 2. Anesthetic Topical Lidocaine 4% cream to wound bed prior to debridement 4. Dressing Applied: Aquacel Ag 5. Secondary Dressing Applied Bordered Foam Dressing Dry Gauze Kerlix/Conform 7. Secured with Secretary/administrator) Signed: 04/07/2017 5:51:06 PM By: [name] Entered By: [name] on 04/07/2017 14:06:24 Rozell, [name] ([id number]) -------------------------------------------------------------------------------- Vitals Details [name] Date of Service: 04/07/2017 1:30 PM Patient Name: [name] Account Number: [id number] Medical Record Treating RN: [name] [id number] Number: Other Clinician: Date of Birth/Sex: January 22, 1921 (83 y.o. Female) Treating [name], [name] Primary Care Ciela Mahajan: [name] Ruthene Methvin/Extender: G Referring Johnpaul Gillentine: [name] in Treatment: 17 Vital Signs Time Taken: 13:30 Temperature (F): 97.7 Height (in): 69 Pulse (bpm): 86 Weight (lbs): 105 Respiratory Rate (breaths/min): 16 Body Mass Index (BMI): 15.5 Blood Pressure (mmHg): 155/65 Reference Range: 80 - 120 mg / dl Notes temp taken axillary Electronic Signature(s) Signed: 04/07/2017 5:51:06 PM By: [name] Entered By: [name] on 04/07/2017 13:35:12

## 2017-04-14 NOTE — Progress Notes (Signed)
NIGEL, WESSMAN ([id number]) Visit Report for 04/07/2017 Debridement Details [name] Date of Service: 04/07/2017 1:30 PM Patient Name: [name] Account Number: [id number] Medical Record Treating RN: [name] [id number] Number: Other Clinician: [birth year] (83 y.o. Treating [name], [name] Date of Birth/Sex: Female) Provider/Extender: G Primary Care Provider: [name] Referring Provider: [name] in Treatment: 17 Debridement Performed for Wound #5 Right Calcaneus Assessment: Performed By: Physician [name], MD Debridement: Debridement Pre-procedure Verification/Time Out Yes - 14:24 Taken: Start Time: 14:25 Pain Control: Lidocaine 4% Topical Solution Level: Skin/Subcutaneous Tissue Total Area Debrided (L x 2 (cm) x 2 (cm) = 4 (cm) W): Tissue and other Viable, Non-Viable, Bone, Exudate, Fibrin/Slough, Subcutaneous material debrided: Instrument: Rongeur, Scissors Specimen: Swab Number of Specimens 1 Taken: Bleeding: Moderate Hemostasis Achieved: Silver Nitrate End Time: 14:30 Procedural Pain: 0 Post Procedural Pain: 0 Response to Treatment: Procedure was tolerated well Post Debridement Measurements of Total Wound Length: (cm) 5.5 Stage: Category/Stage IV Width: (cm) 5.4 Depth: (cm) 0.8 Volume: (cm) 18.661 Character of Wound/Ulcer Post Requires Further Debridement: Debridement Post Procedure Diagnosis KRYSTOL, ROCCO ([id number]) Same as Pre-procedure Electronic Signature(s) Signed: 04/07/2017 5:51:06 PM By: [name] Signed: 04/13/2017 4:14:11 PM By: Baltazar Najjar MD Entered By: [name] on 04/07/2017 17:35:26 Landino, [name] ([id number]) -------------------------------------------------------------------------------- HPI Details [name] Date of Service: 04/07/2017 1:30 PM Patient Name: [name] Account Number: [id number] Medical Record Treating RN: [name] [id number] Number: Other Clinician: June 07, 1921 (83 y.o. Treating [name], [name] Date of Birth/Sex: Female) Provider/Extender: G Primary Care Provider: [name] Referring Provider: [name] in Treatment: 17 History of Present Illness HPI Description: 03/24/16; this is an elderly 83 year old woman with advanced dementia who was hospitalized from 5/8 through 5/13. At that point she had Proteus sepsis felt to be secondary to a UTI the. Acute kidney injury and delirium. Her son who is her primary caregiver says she came home from the hospital with wounds on her right lower extremity and apparently her right buttock as well. There is no mention on the hospital discharge summary of problems. She has been having [facility] and have been using silver alginate based dressings. Apparently the area on the right buttock is healing and the son stated we did not need to become involved with that. In any case the patient has for wounds to on the right heel and 2 on the posterior lateral right calf, the latter of which is not a usual pressure area however that is the history. She is apparently eating and drinking fairly well. Takes ensure well. She does not have any pressure-relief surface at home. I have reviewed her lab work from the hospital. Her admission albumin was 3.4 on 5/8 04/01/16; patient arrives with wounds looking much the same as last week. She has an extensive pressure area over her right heel and to small but deep wounds on the lateral aspect of her right lower leg. These wounds are not connected. Although there are advertised his pressure ulcers these 2 wounds are not usual for pressure ulcerations. We have not looked at the pressure ulceration on her buttock at the request of the patient's son who is the primary caregiver 04/08/16; wounds are stable to improved this week. Culture I did of the lower wound on her right lateral leg grew methicillin sensitive  staph aureus she is on Septra. We are using silver alginate to all the wounds. 04/15/16; the 2 small areas on the lateral aspect of this lady's right leg  continued to improve however the heel is once again covered with callus, residual alginate, acrotic material all of which requires a difficult debridement. There continues to be purulent material under this surface. This is in spite of a week of Septra that I gave him for MSSA 04/22/16 Patient today presents for follow-up evaluation. She is seen with her son in the office at this point in time she does have Alzheimer's. The good news is her wounds appeared to be somewhat smaller with current therapy. At this point in time the infection which was cultured previously appears to have improved in regard to the right heel wound. There is no purulent drainage at this point in time and a good portion of the wound actually is eschar covered and dry with a medial portion actually open to granulation with very little slough covering. She really has no significant discomfort with palpation manipulation of the wound at this point in time. 04/28/16 patient presents today for follow-up evaluation concerning her right lateral calf wound as well as right heel wound. she has not really been complaining this for as pain is concerned according to her son seen with her in the office at this point in time today. She seems to show no interval signs or symptoms of infection overall has been tolerating the dressing changes well. She does have Alzheimer's therefore is not able to rate her pain although she does respond with an affirmative that she hurts when I press over the Funez, Juanelle L. ([id number]) heel region. 05/05/16; the area on the lateral aspect of her right calf is healed. The right heel as 2 small spots that are still open and very close to resolving as well using Aquacel Ag under border foam, 05/20/16; the area on the lateral aspect of her right calf  remains closed. The 2 small areas on her right heel are also closed. READMISSION 12/09/16; this is a 83 year old woman with advanced dementia that we cared for in the clinic from September to the beginning of November 2017. At that point she had a right heel, right lateral calf and right buttock wound all felt to be secondary to pressure. These eventually closed over. The patient's son who is the primary caregiver states that over the last 2 months she is developed bilateral lower extremity pressure ulcers. [facility] has [facility] home and they have been applying Medihoney and an alginate. These were apparently all pressure ulcers today including area on the left posterior calf. Apparently they were keeping her leg elevated on the pillow which contributed to this. The patient has advanced dementia, is nonambulatory and not able to move herself in bed. There is apparently not a nutritional issue. She has [facility]. 12/16/16; patient was readmitted to the clinic last week with multiple wounds including the medial and lateral aspects of the left calcaneus left posterior calf right calcaneus. We've been using Iodoflex, dressing change by home health. Her son also reports that she has had trouble breathing which she attributes to the low air loss mattress we have for pressure relief. He states that when he took her out of bed and put her in her chair breathing improved. As of Monday he took the low air loss mattress off the bed and states that she is able to go to bed without shortness of breath. His description sounded a lot like orthopnea. He is asking [place] to order a gel mattress overlay. According to her son she does not aspirate at least not  frequently 12/23/16; the patient is reviewed today for multiple wounds on her lower extremities. We have been using Iodoflex dressing change by home health. Last week the patient was Cheyne-Stokes in with an x-ray that look like heart failure  with small bilateral pleural effusions. I gave her Lasix and some potassium. She saw her primary physician earlier this week who did not add anything to this. Her son states she is doing better she has not had any of the orthopneic episodes since Monday 01/06/17; 3 week followup. using iodoflex. She has a large wound on the left lateral calf with exposed tendon, and necrotic wound over the left medial heel. Wound over the left lateral malleolus appears to be improved. On the right side necrotic wound on the right medial heel, wound on the right lateral heel looks somewhat better and the area on the left anterior lateral calf looks as though it's progressing towards closure. The patient may have arterial issues however she is too far along in terms of her dementia to undergo testing. Her son claims she is eating well and there offloading this. 01/27/17 on evaluation today patient appears to be doing somewhat worse with new wounds over the right fifth metatarsal region laterally as well as the dorsal right foot. Both of these appear to be deep tissue injuries which have now opened over the two weeks since we last saw her. She also has a mildly tissue injury in the fifth metatarsal region on the left although this does not appear to be worsening and in fact I think wound up being okay. Nonetheless she does have skin in which is loose of the right heel as well as the left posterior calf region. She wears the [employer] when she is in bed but has not been wearing them when she is in her wheelchair. 02/03/17; culture that was done last week of the right dorsal foot grew both methicillin sensitive staph aureus Yorke, Chelbie L. ([id number]) and Pseudomonas. She was initially thought to have Cipro however that up apparently increases QT along with Aricept was not aware that Aricept did this. This was changed to cefdinir and Septra however the patient is apparently allergic to Septra even though it  is not listed in our records. The patient started the cefdinir yesterday. In spite of the delay of antibiotics the area on the right dorsal foot actually looks a lot better than last week with a thick covering eschar and necrotic tissue but no surrounding erythema or drainage. I also took the opportunity today to review her vascular status. When she was in our clinic the first time her ABI on the left was 0.9 and on the right noncompressible. It is still noncompressible today. She has not had imaging studies she has the following wounds including the right dorsal ankle/foot, right heel and right fifth metatarsal head. On the left side she has the medial left heel the left lateral malleolus and the left posterior lateral calf. We have been using Iodoflex on the right and Prisma on the left 02/10/17; in addition to the bilateral foot and calf wounds. It is apparently become clear that the patient actually has a small stage II wound on her coccyx and the right. ishium. They have been apparently therefore many months perhaps total of 8 or 9. Home health is been taking care of these and recently decided that he needed orders. With regards to the lower extremity wounds the really worrisome areas here are the heel on the right. X-rays  I ordered last week showed a possible area of cortical erosion involving the distal lateral fifth metatarsal head question early osteomyelitis. There was no common and on the right heel which was the worrisome area. The left tib-fib showed no osseous abnormality. Left foot showed no acute osseous abnormality she continues to have wounds on the right heel, right anterior foot, right lateral fifth metatarsal head, left medial heel, left lateral heel, left lateral calf as well as the new wounds at least as noted above 03/10/17: I see this patient with advanced dementia monthly on a palliative basis. Presenting with 11 identifiable wounds today. Per her son who is the primary  caregiver she eats and drinks well. He still brings her to doctor's appointments and brings her out for hair appointments. I previously suggested hospice care to this patient's son however he is not willing to listen to this at this point 04/07/17; this is a patient with end-stage dementia that I see for wound care on a palliative basis monthly. Her major continued problem is a right heel wound that is draining necrotic and has exposed bone. With considerable difficulty today I managed to obtain a piece of bone for bone culture. Although the patient's son is never really followed me on the idea of palliative wound care he is certainly not of totally hospice mindset. She also has wounds on the oRight anterior foot with exposed tendon right fifth metatarsal head, left lateral leg which is mostly healed, left heel, roughly T12 on her back and a small probing hole on her lower sacrum Electronic Signature(s) Signed: 04/07/2017 5:33:43 PM By: Baltazar Najjar MD Entered By: Baltazar Najjar on 04/07/2017 17:26:10 Stucky, [name] ([id number]) -------------------------------------------------------------------------------- Physical Exam Details [name] Date of Service: 04/07/2017 1:30 PM Patient Name: [name] Account Number: [id number] Medical Record Treating RN: [name] [id number] Number: Other Clinician: [birth year] (83 y.o. Treating [name], [name] Date of Birth/Sex: Female) Provider/Extender: G Primary Care Provider: [name] Referring Provider: [name] Weeks in Treatment: 17 Constitutional Sitting or standing Blood Pressure is within target range for patient.. Pulse regular and within target range for patient.Marland Kitchen Respirations regular, non-labored and within target range.. Temperature is normal and within the target range for the patient.Marland Kitchen appears in no distress. Notes Wound exam; the large necrotic wound on the right heel has worsened. Now with exposed  bone. Using pickups and Rongeurs managed to get a small piece of bone for culture. oThe right anterior foot/ankle wound has exposed tendon but is otherwise unchanged oRight fifth metatarsal head also appears stable. oLeft heel wound stable oWounds on the left lateral malleolus and left calf are closely healed oDime to quarter-sized area roughly T12 on her back and a small probing area on the sacrum 0.8 cm her about the same Electronic Signature(s) Signed: 04/07/2017 5:33:43 PM By: Baltazar Najjar MD Entered By: Baltazar Najjar on 04/07/2017 17:28:31 Sukup, [name] -------------------------------------------------------------------------------- Physician Orders Details [name] Date of Service: 04/07/2017 1:30 PM Patient Name: [name] Account Number: [id number] Medical Record Treating RN: [name] [id number] Number: Other Clinician: [birth year] (83 y.o. Treating [name], [name] Date of Birth/Sex: Female) Provider/Extender: G Primary Care Provider: [name] Referring Provider: [name] in Treatment: 64 Verbal / Phone Orders: Yes Clinician: Ashok Cordia, Debi Read Back and Verified: Yes Diagnosis Coding Wound Cleansing Wound #10 Right,Dorsal Foot o Clean wound with Normal Saline. o Cleanse wound with mild soap and water Wound #11 Right,Plantar Metatarsal head fifth o Clean wound with Normal Saline. o Cleanse wound  with mild soap and water Wound #13 Sacrum o Clean wound with Normal Saline. o Cleanse wound with mild soap and water Wound #14 Left Gluteus o Clean wound with Normal Saline. o Cleanse wound with mild soap and water Wound #15 Midline Back o Clean wound with Normal Saline. o Cleanse wound with mild soap and water Wound #5 Right Calcaneus o Clean wound with Normal Saline. o Cleanse wound with mild soap and water Wound #6 Left Calcaneus o Clean wound with Normal Saline. o Cleanse wound  with mild soap and water Wound #8 Left,Posterior Lower Leg o Clean wound with Normal Saline. o Cleanse wound with mild soap and water Anesthetic Wound #10 Right,Dorsal Foot Mizrahi, Tory L. ([id number]) o Topical Lidocaine 4% cream applied to wound bed prior to debridement - for clinic use Wound #11 Right,Plantar Metatarsal head fifth o Topical Lidocaine 4% cream applied to wound bed prior to debridement - for clinic use Wound #13 Sacrum o Topical Lidocaine 4% cream applied to wound bed prior to debridement - for clinic use Wound #14 Left Gluteus o Topical Lidocaine 4% cream applied to wound bed prior to debridement - for clinic use Wound #15 Midline Back o Topical Lidocaine 4% cream applied to wound bed prior to debridement - for clinic use Wound #5 Right Calcaneus o Topical Lidocaine 4% cream applied to wound bed prior to debridement - for clinic use Wound #6 Left Calcaneus o Topical Lidocaine 4% cream applied to wound bed prior to debridement - for clinic use Wound #8 Left,Posterior Lower Leg o Topical Lidocaine 4% cream applied to wound bed prior to debridement - for clinic use Skin Barriers/Peri-Wound Care Wound #10 Right,Dorsal Foot o Skin Prep Wound #11 Right,Plantar Metatarsal head fifth o Skin Prep Wound #13 Sacrum o Skin Prep Wound #14 Left Gluteus o Skin Prep Wound #15 Midline Back o Skin Prep Wound #8 Left,Posterior Lower Leg o Skin Prep Primary Wound Dressing Wound #10 Right,Dorsal Foot o Aquacel Ag Wound #11 Right,Plantar Metatarsal head fifth o Aquacel Ag Mumby, Apple L. ([id number]) Wound #13 Sacrum o Iodoform packing Gauze - 1'4 inch Wound #14 Left Gluteus o Aquacel Ag Wound #15 Midline Back o Saline moistened gauze Wound #5 Right Calcaneus o Aquacel Ag Wound #6 Left Calcaneus o Aquacel Ag Wound #8 Left,Posterior Lower Leg o Aquacel Ag Secondary Dressing Wound #10 Right,Dorsal Foot o Dry  Gauze o Boardered Foam Dressing Wound #11 Right,Plantar Metatarsal head fifth o Dry Gauze o Boardered Foam Dressing Wound #13 Sacrum o Dry Gauze o Boardered Foam Dressing Wound #14 Left Gluteus o Dry Gauze o Boardered Foam Dressing Wound #15 Midline Back o Dry Gauze o Boardered Foam Dressing Wound #5 Right Calcaneus o Dry Gauze o Conform/Kerlix o Other - allevyn heel cup, stretch netting #4 Wound #6 Left Calcaneus o Dry Gauze o Conform/Kerlix o Other - allevyn heel cup, stretch netting #4 Haughn, Freada L. ([id number]) Wound #8 Left,Posterior Lower Leg o Dry Gauze o Boardered Foam Dressing Dressing Change Frequency Wound #10 Right,Dorsal Foot o Change dressing every other day. Wound #11 Right,Plantar Metatarsal head fifth o Change dressing every other day. Wound #13 Sacrum o Change dressing every other day. Wound #14 Left Gluteus o Change dressing every other day. Wound #15 Midline Back o Change dressing every day. Wound #5 Right Calcaneus o Change dressing every other day. Wound #6 Left Calcaneus o Change dressing every other day. Wound #8 Left,Posterior Lower Leg o Change dressing every other day. Follow-up Appointments Wound #10 Right,Dorsal Foot o  Other: - 30 days Wound #11 Right,Plantar Metatarsal head fifth o Other: - 30 days Wound #13 Sacrum o Other: - 30 days Wound #14 Left Gluteus o Other: - 30 days Wound #15 Midline Back o Other: - 30 days Wound #5 Right Calcaneus o Other: - 30 days Beddow, Valyn L. ([id number]) Wound #6 Left Calcaneus o Other: - 30 days Wound #8 Left,Posterior Lower Leg o Other: - 30 days Edema Control Wound #10 Right,Dorsal Foot o Elevate legs to the level of the heart and pump ankles as often as possible Wound #11 Right,Plantar Metatarsal head fifth o Elevate legs to the level of the heart and pump ankles as often as possible Wound #5 Right  Calcaneus o Elevate legs to the level of the heart and pump ankles as often as possible Wound #6 Left Calcaneus o Elevate legs to the level of the heart and pump ankles as often as possible Wound #8 Left,Posterior Lower Leg o Elevate legs to the level of the heart and pump ankles as often as possible Off-Loading Wound #10 Right,Dorsal Foot o Turn and reposition every 2 hours o Other: - wear sage boots Wound #11 Right,Plantar Metatarsal head fifth o Turn and reposition every 2 hours o Other: - wear sage boots Wound #13 Sacrum o Turn and reposition every 2 hours o Other: - wear sage boots Wound #14 Left Gluteus o Turn and reposition every 2 hours o Other: - wear sage boots Wound #15 Midline Back o Turn and reposition every 2 hours o Turn and reposition every 2 hours o Other: - wear sage boots o Other: - wear sage boots Wound #5 Right Calcaneus o Turn and reposition every 2 hours Lemon, Jani L. ([id number]) o Other: - wear sage boots Wound #6 Left Calcaneus o Turn and reposition every 2 hours o Other: - wear sage boots Wound #8 Left,Posterior Lower Leg o Turn and reposition every 2 hours o Other: - wear sage boots Additional Orders / Instructions Wound #10 Right,Dorsal Foot o Increase protein intake. o Other: - left 5th plantar metatarsal head paint with betadine every day (deep tissue injury) place foam to the top of the left foot for padded protection, add bordered foam dressing to midline back to help with pressure relief Wound #11 Right,Plantar Metatarsal head fifth o Increase protein intake. o Other: - left 5th plantar metatarsal head paint with betadine every day (deep tissue injury) place foam to the top of the left foot for padded protection, add bordered foam dressing to midline back to help with pressure relief Wound #13 Sacrum o Increase protein intake. o Other: - left 5th plantar metatarsal head paint  with betadine every day (deep tissue injury) place foam to the top of the left foot for padded protection, add bordered foam dressing to midline back to help with pressure relief Wound #14 Left Gluteus o Increase protein intake. o Other: - left 5th plantar metatarsal head paint with betadine every day (deep tissue injury) place foam to the top of the left foot for padded protection, add bordered foam dressing to midline back to help with pressure relief Wound #15 Midline Back o Increase protein intake. o Other: - left 5th plantar metatarsal head paint with betadine every day (deep tissue injury) place foam to the top of the left foot for padded protection, add bordered foam dressing to midline back to help with pressure relief Wound #5 Right Calcaneus o Increase protein intake. o Other: - left 5th plantar metatarsal head paint with betadine  every day (deep tissue injury) place foam to the top of the left foot for padded protection, add bordered foam dressing to midline back to help with pressure relief Wound #6 Left Calcaneus o Increase protein intake. CODIE, HAINER ([id number]) o Other: - left 5th plantar metatarsal head paint with betadine every day (deep tissue injury) place foam to the top of the left foot for padded protection, add bordered foam dressing to midline back to help with pressure relief Wound #8 Left,Posterior Lower Leg o Increase protein intake. o Other: - left 5th plantar metatarsal head paint with betadine every day (deep tissue injury) place foam to the top of the left foot for padded protection, add bordered foam dressing to midline back to help with pressure relief Home Health Wound #10 Right,Dorsal Foot o Continue [facility]its - [facility] o Home Health Nurse may visit PRN to address patientos wound care needs. o FACE TO FACE ENCOUNTER: MEDICARE and MEDICAID PATIENTS: I certify that this patient is under my care and  that I had a face-to-face encounter that meets the physician face-to-face encounter requirements with this patient on this date. The encounter with the patient was in whole or in part for the following MEDICAL CONDITION: (primary reason for Home Healthcare) MEDICAL NECESSITY: I certify, that based on my findings, NURSING services are a medically necessary home health service. HOME BOUND STATUS: I certify that my clinical findings support that this patient is homebound (i.e., Due to illness or injury, pt requires aid of supportive devices such as crutches, cane, wheelchairs, walkers, the use of special transportation or the assistance of another person to leave their place of residence. There is a normal inability to leave the home and doing so requires considerable and taxing effort. Other absences are for medical reasons / religious services and are infrequent or of short duration when for other reasons). o If current dressing causes regression in wound condition, may D/C ordered dressing product/s and apply Normal Saline Moist Dressing daily until next Wound Healing Center / Other MD appointment. Notify [facility] of regression in wound condition at [phone]. o Please direct any NON-WOUND related issues/requests for orders to patient's Primary Care Physician Wound #11 Right,Plantar Metatarsal head fifth o [facility] o Home Health Nurse may visit PRN to address patientos wound care needs. o FACE TO FACE ENCOUNTER: MEDICARE and MEDICAID PATIENTS: I certify that this patient is under my care and that I had a face-to-face encounter that meets the physician face-to-face encounter requirements with this patient on this date. The encounter with the patient was in whole or in part for the following MEDICAL CONDITION: (primary reason for Home Healthcare) MEDICAL NECESSITY: I certify, that based on my findings, NURSING services are a  medically necessary home health service. HOME BOUND STATUS: I certify that my clinical findings support that this patient is homebound (i.e., Due to illness or injury, pt requires aid of supportive devices such as crutches, cane, wheelchairs, walkers, the use of special transportation or the assistance of another person to leave their place of residence. There is a normal inability to leave the home and doing so requires considerable and taxing effort. Other absences are for medical reasons / religious services and are infrequent or of short duration when for other reasons). o If current dressing causes regression in wound condition, may D/C ordered dressing product/s and apply Normal Saline Moist Dressing daily until next Wound Healing Center / Other MD  appointment. Notify W[facility] Healing Center of regression in wound condition at [phone]. RONISHA, HERRINGSHAW ([id number]) o Please direct any NON-WOUND related issues/requests for orders to patient's Primary Care Physician Wound #13 Sacrum o [facility] - [facility] o Home Health Nurse may visit PRN to address patientos wound care needs. o FACE TO FACE ENCOUNTER: MEDICARE and MEDICAID PATIENTS: I certify that this patient is under my care and that I had a face-to-face encounter that meets the physician face-to-face encounter requirements with this patient on this date. The encounter with the patient was in whole or in part for the following MEDICAL CONDITION: (primary reason for Home Healthcare) MEDICAL NECESSITY: I certify, that based on my findings, NURSING services are a medically necessary home health service. HOME BOUND STATUS: I certify that my clinical findings support that this patient is homebound (i.e., Due to illness or injury, pt requires aid of supportive devices such as crutches, cane, wheelchairs, walkers, the use of special transportation or the assistance of another person to leave their  place of residence. There is a normal inability to leave the home and doing so requires considerable and taxing effort. Other absences are for medical reasons / religious services and are infrequent or of short duration when for other reasons). o If current dressing causes regression in wound condition, may D/C ordered dressing product/s and apply Normal Saline Moist Dressing daily until next Wound Healing Center / Other MD appointment. Notify [facility]aling Center of regression in wound condition at [phone]. o Please direct any NON-WOUND related issues/requests for orders to patient's Primary Care Physician Wound #14 Left Gluteus o [facility] o Home Health Nurse may visit PRN to address patientos wound care needs. o FACE TO FACE ENCOUNTER: MEDICARE and MEDICAID PATIENTS: I certify that this patient is under my care and that I had a face-to-face encounter that meets the physician face-to-face encounter requirements with this patient on this date. The encounter with the patient was in whole or in part for the following MEDICAL CONDITION: (primary reason for Home Healthcare) MEDICAL NECESSITY: I certify, that based on my findings, NURSING services are a medically necessary home health service. HOME BOUND STATUS: I certify that my clinical findings support that this patient is homebound (i.e., Due to illness or injury, pt requires aid of supportive devices such as crutches, cane, wheelchairs, walkers, the use of special transportation or the assistance of another person to leave their place of residence. There is a normal inability to leave the home and doing so requires considerable and taxing effort. Other absences are for medical reasons / religious services and are infrequent or of short duration when for other reasons). o If current dressing causes regression in wound condition, may D/C ordered dressing product/s and apply Normal Saline  Moist Dressing daily until next Wound Healing Center / Other MD appointment. Notify [facility]aling Center of regression in wound condition at [phone]. o Please direct any NON-WOUND related issues/requests for orders to patient's Primary Care Physician Wound #15 Midline Back o [facility] - [facility] o Home Health Nurse may visit PRN to address patientos wound care needs. o FACE TO FACE ENCOUNTER: MEDICARE and MEDICAID PATIENTS: I certify that this patient is under my care and that I had a face-to-face encounter that meets the physician face-to-face JOYEL, CHENETTE ([id number]) encounter requirements with this patient on this date. The encounter with the patient was in whole or in part  for the following MEDICAL CONDITION: (primary reason for Home Healthcare) MEDICAL NECESSITY: I certify, that based on my findings, NURSING services are a medically necessary home health service. HOME BOUND STATUS: I certify that my clinical findings support that this patient is homebound (i.e., Due to illness or injury, pt requires aid of supportive devices such as crutches, cane, wheelchairs, walkers, the use of special transportation or the assistance of another person to leave their place of residence. There is a normal inability to leave the home and doing so requires considerable and taxing effort. Other absences are for medical reasons / religious services and are infrequent or of short duration when for other reasons). o If current dressing causes regression in wound condition, may D/C ordered dressing product/s and apply Normal Saline Moist Dressing daily until next Wound Healing Center / Other MD appointment. Notify [facility]aling Center of regression in wound condition at [phone]. o Please direct any NON-WOUND related issues/requests for orders to patient's Primary Care Physician Wound #5 Right Calcaneus o [facility] o Home Health Nurse may visit PRN to address patientos wound care needs. o FACE TO FACE ENCOUNTER: MEDICARE and MEDICAID PATIENTS: I certify that this patient is under my care and that I had a face-to-face encounter that meets the physician face-to-face encounter requirements with this patient on this date. The encounter with the patient was in whole or in part for the following MEDICAL CONDITION: (primary reason for Home Healthcare) MEDICAL NECESSITY: I certify, that based on my findings, NURSING services are a medically necessary home health service. HOME BOUND STATUS: I certify that my clinical findings support that this patient is homebound (i.e., Due to illness or injury, pt requires aid of supportive devices such as crutches, cane, wheelchairs, walkers, the use of special transportation or the assistance of another person to leave their place of residence. There is a normal inability to leave the home and doing so requires considerable and taxing effort. Other absences are for medical reasons / religious services and are infrequent or of short duration when for other reasons). o If current dressing causes regression in wound condition, may D/C ordered dressing product/s and apply Normal Saline Moist Dressing daily until next Wound Healing Center / Other MD appointment. Notify [facility]aling Center of regression in wound condition at [phone]. o Please direct any NON-WOUND related issues/requests for orders to patient's Primary Care Physician Wound #6 Left Calcaneus o [facility] - [facility] o Home Health Nurse may visit PRN to address patientos wound care needs. o FACE TO FACE ENCOUNTER: MEDICARE and MEDICAID PATIENTS: I certify that this patient is under my care and that I had a face-to-face encounter that meets the physician face-to-face encounter requirements with this patient on this date. The encounter with the patient was in whole or in  part for the following MEDICAL CONDITION: (primary reason for Home Healthcare) MEDICAL NECESSITY: I certify, that based on my findings, NURSING services are a medically necessary home health service. HOME BOUND STATUS: I certify that my clinical findings support that this patient is homebound (i.e., Due to illness or injury, pt requires aid of supportive devices such as crutches, cane, wheelchairs, walkers, the use of special transportation or the assistance of another person to leave their place of residence. There is a normal inability to leave the home and doing so requires considerable and taxing effort. Other SHALENA, EZZELL ([id number]) absences are for medical reasons / religious services  and are infrequent or of short duration when for other reasons). o If current dressing causes regression in wound condition, may D/C ordered dressing product/s and apply Normal Saline Moist Dressing daily until next Wound Healing Center / Other MD appointment. Notify [facility] Healing Center of regression in wound condition at [phone]. o Please direct any NON-WOUND related issues/requests for orders to patient's Primary Care Physician Wound #8 Left,Posterior Lower Leg o [facility] - [facility] o Home Health Nurse may visit PRN to address patientos wound care needs. o FACE TO FACE ENCOUNTER: MEDICARE and MEDICAID PATIENTS: I certify that this patient is under my care and that I had a face-to-face encounter that meets the physician face-to-face encounter requirements with this patient on this date. The encounter with the patient was in whole or in part for the following MEDICAL CONDITION: (primary reason for Home Healthcare) MEDICAL NECESSITY: I certify, that based on my findings, NURSING services are a medically necessary home health service. HOME BOUND STATUS: I certify that my clinical findings support that this patient is homebound (i.e., Due to illness or  injury, pt requires aid of supportive devices such as crutches, cane, wheelchairs, walkers, the use of special transportation or the assistance of another person to leave their place of residence. There is a normal inability to leave the home and doing so requires considerable and taxing effort. Other absences are for medical reasons / religious services and are infrequent or of short duration when for other reasons). o If current dressing causes regression in wound condition, may D/C ordered dressing product/s and apply Normal Saline Moist Dressing daily until next Wound Healing Center / Other MD appointment. Notify W[facility] Healing Center of regression in wound condition at [phone]. o Please direct any NON-WOUND related issues/requests for orders to patient's Primary Care Physician Laboratory o Bacteria identified in Wound by Culture (MICRO) - BONE RIGHT CALCANEUS oooo LOINC Code: 6462-6 oooo Convenience Name: Wound culture routine Electronic Signature(s) Signed: 04/13/2017 4:14:11 PM By: Baltazar Najjar MD Signed: 04/13/2017 4:23:28 PM By: [name] Previous Signature: 04/07/2017 5:51:06 PM Version By: [name] Previous Signature: 04/07/2017 5:33:43 PM Version By: Baltazar Najjar MD Entered By: [name] on 04/12/2017 16:34:58 Vanliew, [name] ([id number]) -------------------------------------------------------------------------------- Problem List Details [name] Date of Service: 04/07/2017 1:30 PM Patient Name: [name] Account Number: [id number] Medical Record Treating RN: [name] [id number] Number: Other Clinician: 15-Apr-1921 (83 y.o. 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 Account Number: [id number] Medical Record Treating RN: [name] [id number] Number: Other Clinician: [birth year] (83 y.o. Treating [name], [name] Date of Birth/Sex: Female) Provider/Extender: G Primary Care Provider: [name] Referring Provider: [name] Weeks in Treatment:  17 Subjective History of Present Illness (HPI) 03/24/16; this is an elderly 83 year old woman with advanced dementia who was hospitalized from 5/8 through 5/13. At that point she had Proteus sepsis felt to be secondary to a UTI the. Acute kidney injury and delirium. Her son who is her primary caregiver says she came home from the hospital with wounds on her right lower extremity and apparently her right buttock as well. There is no mention on the hospital discharge summary of problems. She has been having [facility] and have been using silver alginate based dressings. Apparently the area on the right buttock is healing and the son stated we did not need to become involved with that. In any case the patient has for wounds to on the right heel and 2 on the posterior lateral right calf,  the latter of which is not a usual pressure area however that is the history. She is apparently eating and drinking fairly well. Takes ensure well. She does not have any pressure-relief surface at home. I have reviewed her lab work from the hospital. Her admission albumin was 3.4 on 5/8 04/01/16; patient arrives with wounds looking much the same as last week. She has an extensive pressure area over her right heel and to small but deep wounds on the lateral aspect of her right lower leg. These wounds are not connected. Although there are advertised his pressure ulcers these 2 wounds are not usual for pressure ulcerations. We have not looked at the pressure ulceration on her buttock at the request of the patient's son who is the primary caregiver 04/08/16; wounds are stable to improved this week. Culture I did of the lower wound on her right lateral leg grew methicillin sensitive staph aureus she is on Septra. We are using silver alginate to all the wounds. 04/15/16; the 2 small areas on the lateral aspect of this lady's right leg continued to improve however the heel is once again covered with callus, residual alginate, acrotic material all of which requires a difficult debridement. There continues to be purulent material under this surface. This is in spite of a week of Septra that I gave him for MSSA 04/22/16 Patient today presents for follow-up evaluation. She is seen with her son in the office at this point in time she does have Alzheimer's. The good news is her wounds appeared to be somewhat smaller with current therapy. At this point in time the infection which was cultured previously appears to have improved in regard to the right heel wound. There is no purulent drainage at this point in time and a good portion of the wound actually is eschar covered and dry with a medial portion actually open to granulation with very little slough covering. She really has no significant discomfort with  palpation manipulation of the wound at this point in time. 04/28/16 patient presents today for follow-up evaluation concerning her right lateral calf wound as well as right heel wound. she has not really been complaining this for as pain is concerned according to her son seen with her in the office at this point in time today. She seems to show no interval signs or symptoms of infection overall has been tolerating the dressing changes well. She does have Alzheimer's therefore is not Gruber, Lindsay L. ([id number]) able to rate her pain although she does respond with an affirmative that she hurts when I press over the heel region. 05/05/16; the area on the lateral aspect of her right calf is healed.  The right heel as 2 small spots that are still open and very close to resolving as well using Aquacel Ag under border foam, 05/20/16; the area on the lateral aspect of her right calf remains closed. The 2 small areas on her right heel are also closed. READMISSION 12/09/16; this is a 83 year old woman with advanced dementia that we cared for in the clinic from September to the beginning of November 2017. At that point she had a right heel, right lateral calf and right buttock wound all felt to be secondary to pressure. These eventually closed over. The patient's son who is the primary caregiver states that over the last 2 months she is developed bilateral lower extremity pressure ulcers. [facility] and they have been applying Medihoney and an alginate. These were apparently all pressure ulcers today including area on the left posterior calf. Apparently they were keeping her leg elevated on the pillow which contributed to this. The patient has advanced dementia, is nonambulatory and not able to move herself in bed. There is apparently not a nutritional issue. [facility] has [facility]. 12/16/16; patient was readmitted to the clinic last week with multiple wounds including the  medial and lateral aspects of the left calcaneus left posterior calf right calcaneus. We've been using Iodoflex, dressing change by home health. Her son also reports that she has had trouble breathing which she attributes to the low air loss mattress we have for pressure relief. He states that when he took her out of bed and put her in her chair breathing improved. As of Monday he took the low air loss mattress off the bed and states that she is able to go to bed without shortness of breath. His description sounded a lot like orthopnea. He is asking [place] to order a gel mattress overlay. According to her son she does not aspirate at least not frequently 12/23/16; the patient is reviewed today for multiple wounds on her lower extremities. We have been using Iodoflex dressing change by home health. Last week the patient was Cheyne-Stokes in with an x-ray that look like heart failure with small bilateral pleural effusions. I gave her Lasix and some potassium. She saw her primary physician earlier this week who did not add anything to this. Her son states she is doing better she has not had any of the orthopneic episodes since Monday 01/06/17; 3 week followup. using iodoflex. She has a large wound on the left lateral calf with exposed tendon, and necrotic wound over the left medial heel. Wound over the left lateral malleolus appears to be improved. On the right side necrotic wound on the right medial heel, wound on the right lateral heel looks somewhat better and the area on the left anterior lateral calf looks as though it's progressing towards closure. The patient may have arterial issues however she is too far along in terms of her dementia to undergo testing. Her son claims she is eating well and there offloading this. 01/27/17 on evaluation today patient appears to be doing somewhat worse with new wounds over the right fifth metatarsal region laterally as well as the dorsal right foot. Both of these  appear to be deep tissue injuries which have now opened over the two weeks since we last saw her. She also has a mildly tissue injury in the fifth metatarsal region on the left although this does not appear to be worsening and in fact I think wound up being okay. Nonetheless she  does have skin in which is loose of the right heel as well as the left posterior calf region. She wears the [employer] when she is in bed but has not been wearing them when she is in her wheelchair. EVALYNE, CORTOPASSI ([id number]) 02/03/17; culture that was done last week of the right dorsal foot grew both methicillin sensitive staph aureus and Pseudomonas. She was initially thought to have Cipro however that up apparently increases QT along with Aricept was not aware that Aricept did this. This was changed to cefdinir and Septra however the patient is apparently allergic to Septra even though it is not listed in our records. The patient started the cefdinir yesterday. In spite of the delay of antibiotics the area on the right dorsal foot actually looks a lot better than last week with a thick covering eschar and necrotic tissue but no surrounding erythema or drainage. I also took the opportunity today to review her vascular status. When she was in our clinic the first time her ABI on the left was 0.9 and on the right noncompressible. It is still noncompressible today. She has not had imaging studies she has the following wounds including the right dorsal ankle/foot, right heel and right fifth metatarsal head. On the left side she has the medial left heel the left lateral malleolus and the left posterior lateral calf. We have been using Iodoflex on the right and Prisma on the left 02/10/17; in addition to the bilateral foot and calf wounds. It is apparently become clear that the patient actually has a small stage II wound on her coccyx and the right. ishium. They have been apparently therefore many months perhaps  total of 8 or 9. Home health is been taking care of these and recently decided that he needed orders. With regards to the lower extremity wounds the really worrisome areas here are the heel on the right. X-rays I ordered last week showed a possible area of cortical erosion involving the distal lateral fifth metatarsal head question early osteomyelitis. There was no common and on the right heel which was the worrisome area. The left tib-fib showed no osseous abnormality. Left foot showed no acute osseous abnormality she continues to have wounds on the right heel, right anterior foot, right lateral fifth metatarsal head, left medial heel, left lateral heel, left lateral calf as well as the new wounds at least as noted above 03/10/17: I see this patient with advanced dementia monthly on a palliative basis. Presenting with 11 identifiable wounds today. Per her son who is the primary caregiver she eats and drinks well. He still brings her to doctor's appointments and brings her out for hair appointments. I previously suggested hospice care to this patient's son however he is not willing to listen to this at this point 04/07/17; this is a patient with end-stage dementia that I see for wound care on a palliative basis monthly. Her major continued problem is a right heel wound that is draining necrotic and has exposed bone. With considerable difficulty today I managed to obtain a piece of bone for bone culture. Although the patient's son is never really followed me on the idea of palliative wound care he is certainly not of totally hospice mindset. She also has wounds on the Right anterior foot with exposed tendon right fifth metatarsal head, left lateral leg which is mostly healed, left heel, roughly T12 on her back and a small probing hole on her lower sacrum Grant, Deasia L. ([id number]) Objective Constitutional  Sitting or standing Blood Pressure is within target range for patient.. Pulse  regular and within target range for patient.Marland Kitchen Respirations regular, non-labored and within target range.. Temperature is normal and within the target range for the patient.Marland Kitchen appears in no distress. Vitals Time Taken: 1:30 PM, Height: 69 in, Weight: 105 lbs, BMI: 15.5, Temperature: 97.7 F, Pulse: 86 bpm, Respiratory Rate: 16 breaths/min, Blood Pressure: 155/65 mmHg. General Notes: temp taken axillary General Notes: Wound exam; the large necrotic wound on the right heel has worsened. Now with exposed bone. Using pickups and Rongeurs managed to get a small piece of bone for culture. The right anterior foot/ankle wound has exposed tendon but is otherwise unchanged Right fifth metatarsal head also appears stable. Left heel wound stable Wounds on the left lateral malleolus and left calf are closely healed Dime to quarter-sized area roughly T12 on her back and a small probing area on the sacrum 0.8 cm her about the same Integumentary (Hair, Skin) Wound #10 status is Open. Original cause of wound was Pressure Injury. The wound is located on the Right,Dorsal Foot. The wound measures 1.5cm length x 2cm width x 0.4cm depth; 2.356cm^2 area and 0.942cm^3 volume. There is muscle and tendon exposed. There is no tunneling or undermining noted. There is a large amount of serosanguineous drainage noted. The wound margin is distinct with the outline attached to the wound base. There is large (67-100%) red granulation within the wound bed. There is a small (1-33%) amount of necrotic tissue within the wound bed including Adherent Slough. The periwound skin appearance exhibited: Maceration. Periwound temperature was noted as No Abnormality. The periwound has tenderness on palpation. Wound #11 status is Open. Original cause of wound was Pressure Injury. The wound is located on the Right,Plantar Metatarsal head fifth. The wound measures 0.3cm length x 0.3cm width x 0.3cm depth; 0.071cm^2 area and 0.021cm^3 volume.  There is no tunneling or undermining noted. There is a large amount of serous drainage noted. The wound margin is distinct with the outline attached to the wound base. There is no granulation within the wound bed. There is a large (67-100%) amount of necrotic tissue within the wound bed including Adherent Slough. Periwound temperature was noted as No Abnormality. The periwound has tenderness on palpation. Wound #13 status is Open. Original cause of wound was Pressure Injury. The wound is located on the Sacrum. The wound measures 0.5cm length x 0.3cm width x 0.8cm depth; 0.118cm^2 area and 0.094cm^3 volume. There is no tunneling or undermining noted. There is a large amount of serous drainage noted. The wound margin is distinct with the outline attached to the wound base. There is medium (34-66%) red granulation within the wound bed. There is a medium (34-66%) amount of necrotic tissue within the wound bed including Adherent Slough. Periwound temperature was noted as No Abnormality. The periwound has tenderness on palpation. Wound #14 status is Open. Original cause of wound was Pressure Injury. The wound is located on the Left Gluteus. The wound measures 0.2cm length x 0.2cm width x 0.1cm depth; 0.031cm^2 area and 0.003cm^3 volume. There is no tunneling or undermining noted. There is a large amount of serosanguineous drainage Harvie, Christee L. ([id number]) noted. The wound margin is flat and intact. There is large (67-100%) pink granulation within the wound bed. There is a small (1-33%) amount of necrotic tissue within the wound bed including Adherent Slough. Periwound temperature was noted as No Abnormality. The periwound has tenderness on palpation. Wound #15 status is Open. Original cause  of wound was Pressure Injury. The wound is located on the Midline Back. The wound measures 0.8cm length x 0.5cm width x 0.2cm depth; 0.314cm^2 area and 0.063cm^3 volume. There is no tunneling or  undermining noted. There is a large amount of serosanguineous drainage noted. The wound margin is flat and intact. There is large (67-100%) red, pink granulation within the wound bed. There is a small (1-33%) amount of necrotic tissue within the wound bed including Adherent Slough. Periwound temperature was noted as No Abnormality. The periwound has tenderness on palpation. Wound #16 status is Open. Original cause of wound was Other Lesion. The wound is located on the [employer]. The wound measures 0cm length x 0cm width x 0cm depth; 0cm^2 area and 0cm^3 volume. There is no tunneling or undermining noted. There is a none present amount of drainage noted. The wound margin is flat and intact. There is no granulation within the wound bed. There is no necrotic tissue within the wound bed. Periwound temperature was noted as No Abnormality. The periwound has tenderness on palpation. Wound #5 status is Open. Original cause of wound was Pressure Injury. The wound is located on the Right Calcaneus. The wound measures 5.5cm length x 5.4cm width x 0.7cm depth; 23.326cm^2 area and 16.328cm^3 volume. There is bone, muscle, tendon, Fat Layer (Subcutaneous Tissue) Exposed, and fascia exposed. There is no tunneling or undermining noted. There is a large amount of serosanguineous drainage noted. Foul odor after cleansing was noted. The wound margin is distinct with the outline attached to the wound base. There is medium (34-66%) red granulation within the wound bed. There is a medium (34-66%) amount of necrotic tissue within the wound bed including Eschar and Adherent Slough. The periwound skin appearance exhibited: Maceration. Periwound temperature was noted as No Abnormality. The periwound has tenderness on palpation. Wound #6 status is Open. Original cause of wound was Pressure Injury. The wound is located on the Left Calcaneus. The wound measures 1.3cm length x 1.8cm width x 0.6cm depth; 1.838cm^2  area and 1.103cm^3 volume. There is no tunneling or undermining noted. There is a large amount of serosanguineous drainage noted. The wound margin is distinct with the outline attached to the wound base. There is small (1-33%) red granulation within the wound bed. There is a large (67-100%) amount of necrotic tissue within the wound bed including Eschar and Adherent Slough. The periwound skin appearance exhibited: Maceration. Periwound temperature was noted as No Abnormality. The periwound has tenderness on palpation. Wound #7 status is Open. Original cause of wound was Pressure Injury. The wound is located on the Left,Lateral Malleolus. The wound measures 0cm length x 0cm width x 0cm depth; 0cm^2 area and 0cm^3 volume. There is no tunneling or undermining noted. There is a none present amount of drainage noted. The wound margin is flat and intact. There is no granulation within the wound bed. There is no necrotic tissue within the wound bed. Periwound temperature was noted as No Abnormality. Wound #8 status is Open. Original cause of wound was Pressure Injury. The wound is located on the Left,Posterior Lower Leg. The wound measures 1.3cm length x 0.2cm width x 0.5cm depth; 0.204cm^2 area and 0.102cm^3 volume. There is no tunneling or undermining noted. There is a large amount of serosanguineous drainage noted. The wound margin is distinct with the outline attached to the wound base. There is large (67-100%) red granulation within the wound bed. There is a small (1-33%) amount of necrotic tissue within the wound bed including  Adherent Slough. Periwound temperature was noted as No Leyba, Clarity L. ([id number]) Abnormality. The periwound has tenderness on palpation. 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 There was a Skin/Subcutaneous Tissue Debridement ([id number]) debridement with total area of 4 sq cm performed by [name], MD. with the following instrument(s): Rongeur and Scissors to remove Viable and Non-Viable tissue/material including Bone, Exudate, Fibrin/Slough, and Subcutaneous after achieving pain control using Lidocaine 4% Topical Solution. 1 Specimen was taken by a Swab and sent to the lab per facility protocol.A time out was conducted at 14:24, prior to the start of the procedure. A Moderate amount of bleeding was controlled with Silver Nitrate. The procedure was tolerated well with a pain level of 0 throughout and a pain level of 0 following the procedure. Post Debridement Measurements: 5.5cm length x 5.4cm width x 0.8cm depth; 18.661cm^3 volume. Post debridement Stage noted as Category/Stage IV. Character of Wound/Ulcer Post Debridement requires further debridement. Post procedure Diagnosis Wound #5: Same as Pre-Procedure Plan Wound Cleansing: Wound #10 Right,Dorsal Foot: Clean wound with Normal Saline. Cleanse wound with mild soap and water Socarras, Gissel L. ([id number]) Wound #11 Right,Plantar Metatarsal head fifth: Clean wound with Normal Saline. Cleanse wound with mild soap and water Wound #13 Sacrum: Clean wound with Normal Saline. Cleanse wound with mild soap and water Wound #14 Left Gluteus: Clean wound with Normal Saline. Cleanse wound with mild soap and water Wound #15 Midline Back: Clean wound with Normal Saline. Cleanse wound with mild soap and water Wound #5 Right Calcaneus: Clean wound with Normal Saline. Cleanse wound with mild soap and water Wound #6 Left  Calcaneus: Clean wound with Normal Saline. Cleanse wound with mild soap and water Wound #8 Left,Posterior Lower Leg: Clean wound with Normal Saline. Cleanse wound with mild soap and water Anesthetic: Wound #10 Right,Dorsal Foot: Topical Lidocaine 4% cream applied to wound bed prior to debridement - for clinic use Wound #11 Right,Plantar Metatarsal head fifth: Topical Lidocaine 4% cream applied to wound bed prior to debridement - for clinic use Wound #13 Sacrum: Topical Lidocaine 4% cream applied to wound bed prior to debridement - for clinic use Wound #14 Left Gluteus: Topical Lidocaine 4% cream applied to wound bed prior to debridement - for clinic use Wound #15 Midline Back: Topical Lidocaine 4% cream applied to wound bed prior to debridement - for clinic use Wound #5 Right Calcaneus: Topical Lidocaine 4% cream applied to wound bed prior to debridement - for clinic use Wound #6 Left Calcaneus: Topical Lidocaine 4% cream applied to wound bed prior to debridement - for clinic use Wound #8 Left,Posterior Lower Leg: Topical Lidocaine 4% cream applied to wound bed prior to debridement - for clinic use Skin Barriers/Peri-Wound Care: Wound #10 Right,Dorsal Foot: Skin Prep Wound #11 Right,Plantar Metatarsal head fifth: Skin Prep Wound #13 Sacrum: Skin Prep Wound #14 Left Gluteus: Skin Prep Wound #15 Midline Back: Skin Prep Wound #8 Left,Posterior Lower Leg: Skin Prep Jardin, Taelar L. ([id number]) Primary Wound Dressing: Wound #10 Right,Dorsal Foot: Aquacel Ag Wound #11 Right,Plantar  Metatarsal head fifth: Aquacel Ag Wound #13 Sacrum: Iodoform packing Gauze - 1'4 inch Wound #14 Left Gluteus: Aquacel Ag Wound #15 Midline Back: Saline moistened gauze Wound #5 Right Calcaneus: Aquacel Ag Wound #6 Left Calcaneus: Aquacel Ag Wound #8 Left,Posterior Lower Leg: Aquacel Ag Secondary Dressing: Wound #10 Right,Dorsal Foot: Dry Gauze Boardered Foam Dressing Wound #11  Right,Plantar Metatarsal head fifth: Dry Gauze Boardered Foam Dressing Wound #13 Sacrum: Dry Gauze Boardered Foam Dressing Wound #14 Left Gluteus: Dry Gauze Boardered Foam Dressing Wound #15 Midline Back: Dry Gauze Boardered Foam Dressing Wound #5 Right Calcaneus: Dry Gauze Conform/Kerlix Other - allevyn heel cup, stretch netting #4 Wound #6 Left Calcaneus: Dry Gauze Conform/Kerlix Other - allevyn heel cup, stretch netting #4 Wound #8 Left,Posterior Lower Leg: Dry Gauze Boardered Foam Dressing Dressing Change Frequency: Wound #10 Right,Dorsal Foot: Change dressing every other day. Wound #11 Right,Plantar Metatarsal head fifth: Change dressing every other day. Wound #13 Sacrum: Change dressing every other day. Dorer, Elizabethanne L. ([id number]) Wound #14 Left Gluteus: Change dressing every other day. Wound #15 Midline Back: Change dressing every day. Wound #5 Right Calcaneus: Change dressing every other day. Wound #6 Left Calcaneus: Change dressing every other day. Wound #8 Left,Posterior Lower Leg: Change dressing every other day. Follow-up Appointments: Wound #10 Right,Dorsal Foot: Other: - 30 days Wound #11 Right,Plantar Metatarsal head fifth: Other: - 30 days Wound #13 Sacrum: Other: - 30 days Wound #14 Left Gluteus: Other: - 30 days Wound #15 Midline Back: Other: - 30 days Wound #5 Right Calcaneus: Other: - 30 days Wound #6 Left Calcaneus: Other: - 30 days Wound #8 Left,Posterior Lower Leg: Other: - 30 days Edema Control: Wound #10 Right,Dorsal Foot: Elevate legs to the level of the heart and pump ankles as often as possible Wound #11 Right,Plantar Metatarsal head fifth: Elevate legs to the level of the heart and pump ankles as often as possible Wound #5 Right Calcaneus: Elevate legs to the level of the heart and pump ankles as often as possible Wound #6 Left Calcaneus: Elevate legs to the level of the heart and pump ankles as often as  possible Wound #8 Left,Posterior Lower Leg: Elevate legs to the level of the heart and pump ankles as often as possible Off-Loading: Wound #10 Right,Dorsal Foot: Turn and reposition every 2 hours Other: - wear sage boots Wound #11 Right,Plantar Metatarsal head fifth: Turn and reposition every 2 hours Other: - wear sage boots Wound #13 Sacrum: Turn and reposition every 2 hours Other: - wear sage boots Wound #14 Left Gluteus: Turn and reposition every 2 hours Other: - wear sage boots Mazzuca, Rayan L. ([id number]) Wound #15 Midline Back: Turn and reposition every 2 hours Turn and reposition every 2 hours Other: - wear sage boots Other: - wear sage boots Wound #5 Right Calcaneus: Turn and reposition every 2 hours Other: - wear sage boots Wound #6 Left Calcaneus: Turn and reposition every 2 hours Other: - wear sage boots Wound #8 Left,Posterior Lower Leg: Turn and reposition every 2 hours Other: - wear sage boots Additional Orders / Instructions: Wound #10 Right,Dorsal Foot: Increase protein intake. Other: - left 5th plantar metatarsal head paint with betadine every day (deep tissue injury) place foam to the top of the left foot for padded protection, add bordered foam dressing to midline back to help with pressure relief Wound #11 Right,Plantar Metatarsal head fifth: Increase protein intake. Other: - left 5th plantar metatarsal head paint with betadine every day (deep tissue injury) place foam to the  top of the left foot for padded protection, add bordered foam dressing to midline back to help with pressure relief Wound #13 Sacrum: Increase protein intake. Other: - left 5th plantar metatarsal head paint with betadine every day (deep tissue injury) place foam to the top of the left foot for padded protection, add bordered foam dressing to midline back to help with pressure relief Wound #14 Left Gluteus: Increase protein intake. Other: - left 5th plantar metatarsal head  paint with betadine every day (deep tissue injury) place foam to the top of the left foot for padded protection, add bordered foam dressing to midline back to help with pressure relief Wound #15 Midline Back: Increase protein intake. Other: - left 5th plantar metatarsal head paint with betadine every day (deep tissue injury) place foam to the top of the left foot for padded protection, add bordered foam dressing to midline back to help with pressure relief Wound #5 Right Calcaneus: Increase protein intake. Other: - left 5th plantar metatarsal head paint with betadine every day (deep tissue injury) place foam to the top of the left foot for padded protection, add bordered foam dressing to midline back to help with pressure relief Wound #6 Left Calcaneus: Increase protein intake. Other: - left 5th plantar metatarsal head paint with betadine every day (deep tissue injury) place foam to the top of the left foot for padded protection, add bordered foam dressing to midline back to help with pressure relief Wound #8 Left,Posterior Lower Leg: Highland, Celenia L. ([id number]) Increase protein intake. Other: - left 5th plantar metatarsal head paint with betadine every day (deep tissue injury) place foam to the top of the left foot for padded protection, add bordered foam dressing to midline back to help with pressure relief Home Health: Wound #10 Right,Dorsal Foot: Continue [facility] Health Visits - [facility] Nurse may visit PRN to address patient s wound care needs. FACE TO FACE ENCOUNTER: MEDICARE and MEDICAID PATIENTS: I certify that this patient is under my care and that I had a face-to-face encounter that meets the physician face-to-face encounter requirements with this patient on this date. The encounter with the patient was in whole or in part for the following MEDICAL CONDITION: (primary reason for Home Healthcare) MEDICAL NECESSITY: I certify, that based on my findings,  NURSING services are a medically necessary home health service. HOME BOUND STATUS: I certify that my clinical findings support that this patient is homebound (i.e., Due to illness or injury, pt requires aid of supportive devices such as crutches, cane, wheelchairs, walkers, the use of special transportation or the assistance of another person to leave their place of residence. There is a normal inability to leave the home and doing so requires considerable and taxing effort. Other absences are for medical reasons / religious services and are infrequent or of short duration when for other reasons). If current dressing causes regression in wound condition, may D/C ordered dressing product/s and apply Normal Saline Moist Dressing daily until next Wound Healing Center / Other MD appointment. Notify Wound Healing Center of regression in wound condition at [phone]. Please direct any NON-WOUND related issues/requests for orders to patient's Primary Care Physician Wound #11 Right,Plantar Metatarsal head fifth: Continue Home Health Visits - [facility] Nurse may visit PRN to address patient s wound care needs. FACE TO FACE ENCOUNTER: MEDICARE and MEDICAID PATIENTS: I certify that this patient is under my care and that I had a face-to-face encounter that meets the physician  face-to-face encounter requirements with this patient on this date. The encounter with the patient was in whole or in part for the following MEDICAL CONDITION: (primary reason for Home Healthcare) MEDICAL NECESSITY: I certify, that based on my findings, NURSING services are a medically necessary home health service. HOME BOUND STATUS: I certify that my clinical findings support that this patient is homebound (i.e., Due to illness or injury, pt requires aid of supportive devices such as crutches, cane, wheelchairs, walkers, the use of special transportation or the assistance of another person to leave their place of  residence. There is a normal inability to leave the home and doing so requires considerable and taxing effort. Other absences are for medical reasons / religious services and are infrequent or of short duration when for other reasons). If current dressing causes regression in wound condition, may D/C ordered dressing product/s and apply Normal Saline Moist Dressing daily until next Wound Healing Center / Other MD appointment. Notify Wound Healing Center of regression in wound condition at [phone]. Please direct any NON-WOUND related issues/requests for orders to patient's Primary Care Physician Wound #13 Sacrum: Continue Home Health Visits - [facility] Nurse may visit PRN to address patient s wound care needs. FACE TO FACE ENCOUNTER: MEDICARE and MEDICAID PATIENTS: I certify that this patient is under my care and that I had a face-to-face encounter that meets the physician face-to-face encounter requirements with this patient on this date. The encounter with the patient was in whole or in part for the following MEDICAL CONDITION: (primary reason for Home Healthcare) MEDICAL NECESSITY: I certify, that based on my findings, NURSING services are a medically necessary home health service. HOME BOUND STATUS: I certify that my clinical findings support that this patient is homebound (i.e., Due to illness or injury, pt requires aid of supportive devices such as crutches, cane, wheelchairs, walkers, the use of special transportation or the assistance of another person to leave their place of residence. There is a normal inability to leave the home and doing so requires considerable and taxing effort. Other absences are HARLYM, GEHLING ([id number]) for medical reasons / religious services and are infrequent or of short duration when for other reasons). If current dressing causes regression in wound condition, may D/C ordered dressing product/s and apply Normal Saline Moist  Dressing daily until next Wound Healing Center / Other MD appointment. Notify Wound Healing Center of regression in wound condition at [phone]. Please direct any NON-WOUND related issues/requests for orders to patient's Primary Care Physician Wound #14 Left Gluteus: Continue Home Health Visits - [facility] Nurse may visit PRN to address patient s wound care needs. FACE TO FACE ENCOUNTER: MEDICARE and MEDICAID PATIENTS: I certify that this patient is under my care and that I had a face-to-face encounter that meets the physician face-to-face encounter requirements with this patient on this date. The encounter with the patient was in whole or in part for the following MEDICAL CONDITION: (primary reason for Home Healthcare) MEDICAL NECESSITY: I certify, that based on my findings, NURSING services are a medically necessary home health service. HOME BOUND STATUS: I certify that my clinical findings support that this patient is homebound (i.e., Due to illness or injury, pt requires aid of supportive devices such as crutches, cane, wheelchairs, walkers, the use of special transportation or the assistance of another person to leave their place of residence. There is a normal inability to leave the home and doing so requires considerable and  taxing effort. Other absences are for medical reasons / religious services and are infrequent or of short duration when for other reasons). If current dressing causes regression in wound condition, may D/C ordered dressing product/s and apply Normal Saline Moist Dressing daily until next Wound Healing Center / Other MD appointment. Notify Wound Healing Center of regression in wound condition at [phone]. Please direct any NON-WOUND related issues/requests for orders to patient's Primary Care Physician Wound #15 Midline Back: Continue Home Health Visits - [facility] Nurse may visit PRN to address patient s wound care  needs. FACE TO FACE ENCOUNTER: MEDICARE and MEDICAID PATIENTS: I certify that this patient is under my care and that I had a face-to-face encounter that meets the physician face-to-face encounter requirements with this patient on this date. The encounter with the patient was in whole or in part for the following MEDICAL CONDITION: (primary reason for Home Healthcare) MEDICAL NECESSITY: I certify, that based on my findings, NURSING services are a medically necessary home health service. HOME BOUND STATUS: I certify that my clinical findings support that this patient is homebound (i.e., Due to illness or injury, pt requires aid of supportive devices such as crutches, cane, wheelchairs, walkers, the use of special transportation or the assistance of another person to leave their place of residence. There is a normal inability to leave the home and doing so requires considerable and taxing effort. Other absences are for medical reasons / religious services and are infrequent or of short duration when for other reasons). If current dressing causes regression in wound condition, may D/C ordered dressing product/s and apply Normal Saline Moist Dressing daily until next Wound [facility] / Other MD appointment. Notify Wound Healing Center of regression in wound condition at [phone]. Please direct any NON-WOUND related issues/requests for orders to patient's Primary Care Physician Wound #5 Right Calcaneus: Continue [facility] Nurse may visit PRN to address patient s wound care needs. FACE TO FACE ENCOUNTER: MEDICARE and MEDICAID PATIENTS: I certify that this patient is under my care and that I had a face-to-face encounter that meets the physician face-to-face encounter requirements with this patient on this date. The encounter with the patient was in whole or in part for the following MEDICAL CONDITION: (primary reason for Home Healthcare) MEDICAL NECESSITY:  I certify, that based on my findings, NURSING services are a medically necessary home health service. HOME BOUND STATUS: I certify that my clinical findings support that this patient is homebound (i.e., Due to illness or injury, pt requires aid of supportive devices such as crutches, cane, wheelchairs, walkers, the use of special transportation or the assistance of another person to leave their place of residence. There is a normal inability to leave the home and doing so requires considerable and taxing effort. Other absences are ELEANA, [place] ([id number]) for medical reasons / religious services and are infrequent or of short duration when for other reasons). If current dressing causes regression in wound condition, may D/C ordered dressing product/s and apply Normal Saline Moist Dressing daily until next Wound Healing Center / Other MD appointment. Notify Wound Healing Center of regression in wound condition at [phone]. Please direct any NON-WOUND related issues/requests for orders to patient's Primary Care Physician Wound #6 Left Calcaneus: Continue [facility] - [facility] Nurse may visit PRN to address patient s wound care needs. FACE TO FACE ENCOUNTER: MEDICARE and MEDICAID PATIENTS: I certify that this  patient is under my care and that I had a face-to-face encounter that meets the physician face-to-face encounter requirements with this patient on this date. The encounter with the patient was in whole or in part for the following MEDICAL CONDITION: (primary reason for Home Healthcare) MEDICAL NECESSITY: I certify, that based on my findings, NURSING services are a medically necessary home health service. HOME BOUND STATUS: I certify that my clinical findings support that this patient is homebound (i.e., Due to illness or injury, pt requires aid of supportive devices such as crutches, cane, wheelchairs, walkers, the use of special transportation or the  assistance of another person to leave their place of residence. There is a normal inability to leave the home and doing so requires considerable and taxing effort. Other absences are for medical reasons / religious services and are infrequent or of short duration when for other reasons). If current dressing causes regression in wound condition, may D/C ordered dressing product/s and apply Normal Saline Moist Dressing daily until next Wound Healing Center / Other MD appointment. Notify Wound Healing Center of regression in wound condition at [phone]. Please direct any NON-WOUND related issues/requests for orders to patient's Primary Care Physician Wound #8 Left,Posterior Lower Leg: Continue [facility] - [facility] Nurse may visit PRN to address patient s wound care needs. FACE TO FACE ENCOUNTER: MEDICARE and MEDICAID PATIENTS: I certify that this patient is under my care and that I had a face-to-face encounter that meets the physician face-to-face encounter requirements with this patient on this date. The encounter with the patient was in whole or in part for the following MEDICAL CONDITION: (primary reason for Home Healthcare) MEDICAL NECESSITY: I certify, that based on my findings, NURSING services are a medically necessary home health service. HOME BOUND STATUS: I certify that my clinical findings support that this patient is homebound (i.e., Due to illness or injury, pt requires aid of supportive devices such as crutches, cane, wheelchairs, walkers, the use of special transportation or the assistance of another person to leave their place of residence. There is a normal inability to leave the home and doing so requires considerable and taxing effort. Other absences are for medical reasons / religious services and are infrequent or of short duration when for other reasons). If current dressing causes regression in wound condition, may D/C ordered dressing product/s  and apply Normal Saline Moist Dressing daily until next Wound Healing Center / Other MD appointment. Notify Wound Healing Center of regression in wound condition at [phone]. Please direct any NON-WOUND related issues/requests for orders to patient's Primary Care Physician Laboratory ordered were: Wound culture routine - BONE RIGHT CALCANEUS #1 silver alginate to all wound areas Mulvaney, Betrice L. ([id number]) #2 I took a piece of bone off the right heel to see if we can get targeted antibiotic therapy for the wound on the right heel. I cannot see putting this patient through an MRI although we were able to get plain x-rays a month or 2 ago. #3 from my point of view this is a palliative wound care setting as I really don't have any real expectation that all of these wounds will heal although she seems so done well on the left lateral malleolus and left lateral leg #4 follow-up in a month Electronic Signature(s) Signed: 04/13/2017 9:48:31 AM By: Elliot Gurney, BSN, RN, CWS, Kim RN, BSN Signed: 04/13/2017 4:14:11 PM By: Baltazar Najjar MD Previous Signature: 04/07/2017 5:33:43 PM Version By: Baltazar Najjar MD Entered By:  Elliot Gurney, BSN, RN, CWS, Kim on 04/13/2017 09:48:31 Harvill, [name] ([id number]) -------------------------------------------------------------------------------- [facility] Details [name] Date of Service: 04/07/2017 Patient Name: [name] Account Number: [id number] Medical Record Treating RN: [name] [id number] Number: Other Clinician: [birth year] (83 y.o. 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 L89.152 Pressure ulcer of sacral region, stage 2 L89.312 Pressure ulcer of right buttock, stage 2 Facility Procedures CPT4 Code: [id number] Description: 11044 - DEB BONE 20 SQ CM/< ICD-10 Description Diagnosis L89.613 Pressure ulcer of right heel, stage 3 Modifier: Quantity: 1 Physician Procedures CPT4: Description Modifier Quantity Code [id number] Debridement; bone (includes epidermis, dermis, subQ tissue, muscle 1 and/or fascia, if performed) 1st 20 sqcm or less ICD-10 Description Diagnosis L89.613 Pressure ulcer of right heel, stage 3 Electronic Signature(s) Signed: 04/07/2017 5:33:43 PM By: Baltazar Najjar MD Entered By: Baltazar Najjar on 04/07/2017 17:31:50

## 2017-04-15 LAB — MISC LABCORP TEST (SEND OUT): Labcorp test code: 182857

## 2017-04-22 LAB — SUSCEPTIBILITY RESULT

## 2017-04-24 LAB — AEROBIC CULTURE W GRAM STAIN (SUPERFICIAL SPECIMEN)

## 2017-04-24 LAB — AEROBIC CULTURE  (SUPERFICIAL SPECIMEN)

## 2017-05-05 ENCOUNTER — Encounter: Payer: Medicare Other | Attending: Internal Medicine | Admitting: Internal Medicine

## 2017-05-05 DIAGNOSIS — L89613 Pressure ulcer of right heel, stage 3: Secondary | ICD-10-CM | POA: Diagnosis not present

## 2017-05-05 DIAGNOSIS — L97223 Non-pressure chronic ulcer of left calf with necrosis of muscle: Secondary | ICD-10-CM | POA: Diagnosis not present

## 2017-05-05 DIAGNOSIS — L89623 Pressure ulcer of left heel, stage 3: Secondary | ICD-10-CM | POA: Insufficient documentation

## 2017-05-05 DIAGNOSIS — L89312 Pressure ulcer of right buttock, stage 2: Secondary | ICD-10-CM | POA: Insufficient documentation

## 2017-05-05 DIAGNOSIS — G301 Alzheimer's disease with late onset: Secondary | ICD-10-CM | POA: Diagnosis not present

## 2017-05-05 DIAGNOSIS — L89152 Pressure ulcer of sacral region, stage 2: Secondary | ICD-10-CM | POA: Diagnosis not present

## 2017-05-05 DIAGNOSIS — L89523 Pressure ulcer of left ankle, stage 3: Secondary | ICD-10-CM | POA: Diagnosis not present

## 2017-05-06 ENCOUNTER — Other Ambulatory Visit
Admission: RE | Admit: 2017-05-06 | Discharge: 2017-05-06 | Disposition: A | Discharge status: Home / Self Care | Payer: Medicare Other | Source: Ambulatory Visit | Attending: Internal Medicine | Admitting: Internal Medicine

## 2017-05-06 DIAGNOSIS — B998 Other infectious disease: Secondary | ICD-10-CM | POA: Insufficient documentation

## 2017-05-10 LAB — AEROBIC CULTURE W GRAM STAIN (SUPERFICIAL SPECIMEN)

## 2017-05-10 LAB — AEROBIC CULTURE  (SUPERFICIAL SPECIMEN)

## 2017-05-10 NOTE — Progress Notes (Addendum)
TRISA, CRANOR ([id number]) Visit Report for 05/05/2017 Arrival Information Details Patient Name: [name], [name]. Date of Service: 05/05/2017 1:15 PM Medical Record Number: [id number] Patient Account Number: [id number] Date of Birth/Sex: 08/28/1920 (83 y.o. Female) Treating RN: [name], [name] Primary Care [name]: [name] Other Clinician: Referring Jolynda Townley: [name] Treating [name]/Extender: [name] in Treatment: 21 Visit Information History Since Last Visit All ordered tests and consults were No Patient Arrived: Wheel Chair completed: Arrival Time: 13:33 Added or deleted any medications: No Accompanied By: son Any new allergies or adverse reactions: No Transfer Assistance: None Had a fall or experienced change in No Patient Identification Verified: Yes activities of daily living that may affect Secondary Verification Process Yes risk of falls: Completed: Signs or symptoms of abuse/neglect since No Patient Requires Transmission-Based No last visito Precautions: Hospitalized since last visit: No Patient Has Alerts: Yes Has Dressing in Place as Prescribed: Yes Patient Alerts: L ABI non- Pain Present Now: Unable to compressible Respond R ABI non- compressible Electronic Signature(s) Signed: 05/06/2017 4:19:36 PM By: [name] Entered By: [name] [name], [name] ([id number]) -------------------------------------------------------------------------------- Clinic Level of Care Assessment Details Patient Name: [name] Babe L. Date of Service: 05/05/2017 1:15 PM Medical Record Number: [id number] Patient Account Number: [id number] Date of Birth/Sex: [birth year] (83 y.o. Female) Treating RN: [name], [name] Primary Care Kevon Tench: [name] Other Clinician: Referring [name]: [name] Treating [name]/Extender: [name] in Treatment: 21 Clinic Level of  Care Assessment Items TOOL 4 Quantity Score X - Use when only an EandM is performed on FOLLOW-UP visit 1 0 ASSESSMENTS - Nursing Assessment / Reassessment X - Reassessment of Co-morbidities (includes updates in patient status) 1 10 X- 1 5 Reassessment of Adherence to Treatment Plan ASSESSMENTS - Wound and Skin Assessment / Reassessment []  - Simple Wound Assessment / Reassessment - one wound 0 X- 7 5 Complex Wound Assessment / Reassessment - multiple wounds []  - 0 Dermatologic / Skin Assessment (not related to wound area) ASSESSMENTS - Focused Assessment []  - Circumferential Edema Measurements - multi extremities 0 []  - 0 Nutritional Assessment / Counseling / Intervention []  - 0 Lower Extremity Assessment (monofilament, tuning fork, pulses) []  - 0 Peripheral Arterial Disease Assessment (using hand held doppler) ASSESSMENTS - Ostomy and/or Continence Assessment and Care []  - Incontinence Assessment and Management 0 []  - 0 Ostomy Care Assessment and Management (repouching, etc.) PROCESS - Coordination of Care []  - Simple Patient / Family Education for ongoing care 0 X- 1 20 Complex (extensive) Patient / Family Education for ongoing care X- 1 10 Staff obtains Chiropractor, Records, Test Results / Process Orders X- 1 10 Staff telephones HHA, Nursing Homes / Clarify orders / etc []  - 0 Routine Transfer to another Facility (non-emergent condition) []  - 0 Routine Hospital Admission (non-emergent condition) []  - 0 New Admissions / Manufacturing engineer / Ordering NPWT, Apligraf, etc. []  - 0 Emergency Hospital Admission (emergent condition) X- 1 10 Simple Discharge Coordination [name]. ([id number]) []  - 0 Complex (extensive) Discharge Coordination PROCESS - Special Needs []  - Pediatric / Minor Patient Management 0 []  - 0 Isolation Patient Management []  - 0 Hearing / Language / Visual special needs []  - 0 Assessment of Community assistance (transportation, D/C  planning, etc.) []  - 0 Additional assistance / Altered mentation []  - 0 Support Surface(s) Assessment (bed, cushion, seat, etc.) INTERVENTIONS - Wound Cleansing / Measurement []  - Simple Wound Cleansing - one wound 0 X- 7 5 Complex Wound  Cleansing - multiple wounds X- 1 5 Wound Imaging (photographs - any number of wounds) []  - 0 Wound Tracing (instead of photographs) []  - 0 Simple Wound Measurement - one wound X- 7 5 Complex Wound Measurement - multiple wounds INTERVENTIONS - Wound Dressings X - Small Wound Dressing one or multiple wounds 7 10 []  - 0 Medium Wound Dressing one or multiple wounds []  - 0 Large Wound Dressing one or multiple wounds X- 1 5 Application of Medications - topical []  - 0 Application of Medications - injection INTERVENTIONS - Miscellaneous []  - External ear exam 0 []  - 0 Specimen Collection (cultures, biopsies, blood, body fluids, etc.) X- 1 5 Specimen(s) / Culture(s) sent or taken to Lab for analysis []  - 0 Patient Transfer (multiple staff / Nurse, adult / Similar devices) []  - 0 Simple Staple / Suture removal (25 or less) []  - 0 Complex Staple / Suture removal (26 or more) []  - 0 Hypo / Hyperglycemic Management (close monitor of Blood Glucose) []  - 0 Ankle / Brachial Index (ABI) - do not check if billed separately X- 1 5 Vital Signs [name], [name] ([id number]) Has the patient been seen at the hospital within the last three years: Yes Total Score: 260 Level Of Care: New/Established - Level 5 Electronic Signature(s) Signed: 05/06/2017 4:19:36 PM By: [name] Entered By: [name] on 05/05/2017 16:56:07 [name], [name] ([id number]) -------------------------------------------------------------------------------- Complex / Palliative Patient Assessment Details Patient Name: [name], [name] Date of Service: 05/05/2017 1:15 PM Medical Record Number: [id number] Patient Account Number: [id number] Date of Birth/Sex:  1920-11-13 (83 y.o. Female) Treating RN: [name], [name] Primary Care [name]: [name] Other Clinician: Referring Denai Caba: [name] Treating [name]: [name] [place] in Treatment: 21 Palliative Management Criteria Patient has a terminal condition (life expectancy of less than 6 months) and advanced wound care would negatively impact the patient's quality of life. Complex Wound Management Criteria Care Approach Wound Care Plan: Palliative Wound Management Electronic Signature(s) Signed: 05/05/2017 5:39:18 PM By: [name] Signed: 05/05/2017 5:51:56 PM By: Baltazar Najjar MD Entered By: [name] on 05/05/2017 17:39:18 [name], [name] ([id number]) -------------------------------------------------------------------------------- Encounter Discharge Information Details Patient Name: [name], [name] Date of Service: 05/05/2017 1:15 PM Medical Record Number: [id number] Patient Account Number: [id number] Date of Birth/Sex: 04-May-1921 (83 y.o. Female) Treating RN: [name], [name] Primary Care Ellean Firman: [name] Other Clinician: Referring Denajah Farias: [name] Treating Zola Runion/Extender: [name] [place] in Treatment: 21 Encounter Discharge Information Items Discharge Condition: Stable Ambulatory Status: Wheelchair Discharge Destination: Home Transportation: Private Auto Accompanied By: son Schedule Follow-up Appointment: Yes Medication Reconciliation completed and No provided to Patient/Care Brier Reid: Provided on Clinical Summary of Care: 05/05/2017 Form Type Recipient Paper Patient ES Electronic Signature(s) Signed: 05/05/2017 4:59:17 PM By: [name] Entered By: [name] on 05/05/2017 16:59:17 [place], [name] ([id number]) -------------------------------------------------------------------------------- Lower Extremity Assessment Details Patient Name: [name], [name] Date of  Service: 05/05/2017 1:15 PM Medical Record Number: [id number] Patient Account Number: [id number] Date of Birth/Sex: [birth year] (83 y.o. Female) Treating RN: [name], [name] Primary Care Dan Scearce: [name] Other Clinician: Referring Special Ranes: [name] Treating Ashelyn Mccravy/Extender: [name] [place] in Treatment: 21 Vascular Assessment Pulses: Dorsalis Pedis Palpable: [Left:Yes] [Right:Yes] Posterior Tibial Extremity colors, hair growth, and conditions: Extremity Color: [Left:Mottled] [Right:Mottled] Temperature of Extremity: [Left:Warm] [Right:Warm] Capillary Refill: [Left:< 3 seconds] [Right:< 3 seconds] Toe Nail Assessment Left: Right: Thick: Yes Yes Discolored: Yes Yes Deformed: Yes Yes Improper Length and Hygiene: Yes Yes Electronic Signature(s) Signed: 05/06/2017 4:19:36 PM  By: [name] Entered By: [name] on 05/05/2017 14:15:47 Farewell, Glorianne Elbert Ewings ([id number]) -------------------------------------------------------------------------------- Multi Wound Chart Details Patient Name: [name], [name] Date of Service: 05/05/2017 1:15 PM Medical Record Number: [id number] Patient Account Number: [id number] Date of Birth/Sex: 1920/08/27 (83 y.o. Female) Treating RN: [name], [name] Primary Care Cammi Consalvo: [name] Other Clinician: Referring Jayvien Rowlette: [name] Treating Javious Hallisey/Extender: [name] [place] in Treatment: 21 Vital Signs Height(in): 69 Pulse(bpm): 81 Weight(lbs): 105 Blood Pressure(mmHg): 140/71 Body Mass Index(BMI): 16 Temperature(F): 97.7 Respiratory Rate 16 (breaths/min): Photos: [10:No Photos] [11:No Photos] [13:No Photos] Wound Location: [10:Right, Dorsal Foot] [11:Right, Plantar Metatarsal head fifth] [13:Sacrum] Wounding Event: [10:Pressure Injury] [11:Pressure Injury] [13:Pressure Injury] Primary Etiology: [10:Pressure Ulcer] [11:Pressure Ulcer] [13:Pressure Ulcer] Comorbid History: [10:N/A]  [11:N/A] [13:N/A] Date Acquired: [10:01/25/2017] [11:01/22/2017] [13:03/20/2016] Weeks of Treatment: [10:14] [11:14] [13:12] Wound Status: [10:Open] [11:Open] [13:Open] Clustered Wound: [10:No] [11:No] [13:No] Pending Amputation on [10:No] [11:No] [13:No] Presentation: Measurements L x W x D [10:0.9x0.5x0.2] [11:0.2x0.2x0.2] [13:0.4x0.5x0.8] (cm) Area (cm) : [10:0.353] [11:0.031] [13:0.157] Volume (cm) : [10:0.071] [11:0.006] [13:0.126] % Reduction in Area: [10:85.00%] [11:75.40%] [13:42.90%] % Reduction in Volume: [10:69.90%] [11:84.20%] [13:54.20%] Classification: [10:Category/Stage IV] [11:Category/Stage II] [13:Category/Stage IV] Exudate Amount: [10:N/A] [11:N/A] [13:N/A] Wound Margin: [10:N/A] [11:N/A] [13:N/A] Granulation Amount: [10:N/A] [11:N/A] [13:N/A] Necrotic Amount: [10:N/A] [11:N/A] [13:N/A] Epithelialization: [10:N/A] [11:N/A] [13:N/A] Periwound Skin Texture: [10:No Abnormalities Noted] [11:No Abnormalities Noted] [13:No Abnormalities Noted] Periwound Skin Moisture: [10:No Abnormalities Noted] [11:No Abnormalities Noted] [13:No Abnormalities Noted] Periwound Skin Color: [10:No Abnormalities Noted] [11:No Abnormalities Noted] [13:No Abnormalities Noted] Temperature: [10:N/A] [11:N/A] [13:N/A] Tenderness on Palpation: [10:No N/A] [11:No N/A] [13:No N/A] Wound Number: [id number]  Photos: No Photos No Photos No Photos Wound Location: Left Gluteus Midline Back Right Calcaneus Wounding Event: Pressure Injury Pressure Injury Pressure Injury Primary Etiology: Pressure Ulcer Pressure Ulcer Pressure Ulcer [name], [name] ([id number]) Comorbid History: Cataracts, Anemia, N/A N/A Hypertension, History of pressure wounds, Osteoarthritis, Dementia Date Acquired: 03/10/2017 03/10/2017 10/09/2016 Weeks of Treatment: 8 8 21  Wound Status: Healed - Epithelialized Open Open Clustered Wound: No No Yes Pending Amputation on No No Yes Presentation: Measurements L x W x D 0x0x0 0.8x0.3x0.4  5.5x4.4x0.4 (cm) Area (cm) : 0 0.188 19.007 Volume (cm) : 0 0.075 7.603 % Reduction in Area: 100.00% 80.00% 59.70% % Reduction in Volume: 100.00% 20.20% -61.40% Classification: Category/Stage II Category/Stage II Category/Stage IV Exudate Amount: None Present N/A N/A Wound Margin: Flat and Intact N/A N/A Granulation Amount: None Present (0%) N/A N/A Necrotic Amount: None Present (0%) N/A N/A Epithelialization: Large (67-100%) N/A N/A Periwound Skin Texture: No Abnormalities Noted No Abnormalities Noted No Abnormalities Noted Periwound Skin Moisture: No Abnormalities Noted No Abnormalities Noted No Abnormalities Noted Periwound Skin Color: No Abnormalities Noted No Abnormalities Noted No Abnormalities Noted Temperature: No Abnormality N/A N/A Tenderness on Palpation: No No No Wound Preparation: Ulcer Cleansing: N/A N/A Rinsed/Irrigated with Saline Topical Anesthetic Applied: None Wound Number: [id number] N/A Photos: No Photos No Photos N/A Wound Location: Left Calcaneus Left, Posterior Lower Leg N/A Wounding Event: Pressure Injury Pressure Injury N/A Primary Etiology: Pressure Ulcer Pressure Ulcer N/A Comorbid History: N/A N/A N/A Date Acquired: 10/09/2016 10/09/2016 N/A Weeks of Treatment: 21 21 N/A Wound Status: Open Open N/A Clustered Wound: No Yes N/A Pending Amputation on Yes Yes N/A Presentation: Measurements L x W x D 1.5x1.2x0.3 1.3x0.5x0.2 N/A (cm) Area (cm) : 1.414 0.511 N/A Volume (cm) : 0.424 0.102 N/A % Reduction in Area: 91.60% 95.70% N/A % Reduction in Volume: 74.90% 98.60% N/A Classification: Category/Stage II Category/Stage  IV N/A Exudate Amount: N/A N/A N/A Wound Margin: N/A N/A N/A Granulation Amount: N/A N/A N/A Necrotic Amount: N/A N/A N/A Epithelialization: N/A N/A N/A [name], [name] ([id number]) Periwound Skin Texture: No Abnormalities Noted No Abnormalities Noted N/A Periwound Skin Moisture: No Abnormalities Noted No Abnormalities Noted  N/A Periwound Skin Color: No Abnormalities Noted No Abnormalities Noted N/A Temperature: N/A N/A N/A Tenderness on Palpation: No No N/A Wound Preparation: N/A N/A N/A Treatment Notes Electronic Signature(s) Signed: 05/06/2017 4:19:36 PM By: [name] Entered By: [name] on 05/05/2017 14:38:35 Spizzirri, [name] ([id number]) -------------------------------------------------------------------------------- Multi-Disciplinary Care Plan Details Patient Name: [name] Babe L. Date of Service: 05/05/2017 1:15 PM Medical Record Number: [id number] Patient Account Number: [id number] Date of Birth/Sex: 1920/11/12 (83 y.o. Female) Treating RN: [name], [name] Primary Care Ragena Fiola: [name] Other Clinician: Referring Jimena Wieczorek: [name] Treating Michelina [place]/Extender: [name] Weeks in Treatment: 21 Active Inactive Electronic Signature(s) Signed: 05/13/2017 9:58:08 AM By: [name] Previous Signature: 05/06/2017 4:19:36 PM Version By: [name] Entered By: [name] on 05/13/2017 09:58:05 Ferryman, [name] -------------------------------------------------------------------------------- Pain Assessment Details Patient Name: Aron, Bowen L. Date of Service: 05/05/2017 1:15 PM Medical Record Number: [id number] Patient Account Number: [id number] Date of Birth/Sex: Jun 10, 1921 (83 y.o. Female) Treating RN: [name], [name] Primary Care Mazal Ebey: [name] Other Clinician: Referring Mianna Iezzi: [name] Treating Kayce Betty/Extender: [name] [place] in Treatment: 21 Active Problems Location of Pain Severity and Description of Pain Patient Has Paino Patient Unable to Respond Site Locations Pain Management and Medication Current Pain Management: Electronic Signature(s) Signed: 05/06/2017 4:19:36 PM By: [name] Entered By: [name] on 05/05/2017 13:38:03 [place], [name]  ([id number]) -------------------------------------------------------------------------------- Patient/Caregiver Education Details Patient Name: [name] Babe L. Date of Service: 05/05/2017 1:15 PM Medical Record Number: [id number] Patient Account Number: [id number] Date of Birth/Gender: 01/15/1921 (83 y.o. Female) Treating RN: [name], [name] Primary Care Physician: [name] Other Clinician: Referring Physician: Antony [name] Treating Physician/Extender: [name] [place] in Treatment: 21 Education Assessment Education Provided To: Caregiver son Education Topics Provided Wound/Skin Impairment: Handouts: Other: change dressing as ordered Methods: Demonstration, Explain/Verbal Responses: State content correctly Electronic Signature(s) Signed: 05/06/2017 4:19:36 PM By: [name] Entered By: [name] on 05/05/2017 16:59:40 [place], [name] ([id number]) -------------------------------------------------------------------------------- Wound Assessment Details Patient Name: [place], Naomee L. Date of Service: 05/05/2017 1:15 PM Medical Record Number: [id number] Patient Account Number: [id number] Date of Birth/Sex: 1921/07/16 (83 y.o. Female) Treating RN: [name], [name] Primary Care Kayah Hecker: [name] Other Clinician: Referring Krysia Zahradnik: [name] Treating Krishika Bugge/Extender: [name] Weeks in Treatment: 21 Wound Status Wound Number: 10 Primary Pressure Ulcer Etiology: Wound Location: Right Foot - Dorsal Wound Open Wounding Event: Pressure Injury Status: Date Acquired: 01/25/2017 Comorbid Cataracts, Anemia, Hypertension, History of Weeks Of Treatment: 14 History: pressure wounds, Osteoarthritis, Dementia Clustered Wound: No Photos Photo Uploaded By: [name] on 05/06/2017 14:09:08 Wound Measurements Length: (cm) 0.9 Width: (cm) 0.5 Depth: (cm) 0.2 Area: (cm) 0.353 Volume: (cm) 0.071 % Reduction in  Area: 85% % Reduction in Volume: 69.9% Epithelialization: Small (1-33%) Tunneling: No Undermining: No Wound Description Classification: Category/Stage IV Wound Margin: Distinct, outline attached Exudate Amount: Large Exudate Type: Serosanguineous Exudate Color: red, brown Foul Odor After Cleansing: No Slough/Fibrino Yes Wound Bed Granulation Amount: Large (67-100%) Exposed Structure Granulation Quality: Red Tendon Exposed: Yes Necrotic Amount: Small (1-33%) Muscle Exposed: Yes Necrotic Quality: Adherent Slough Necrosis of Muscle: No Periwound Skin Texture Texture Color No Abnormalities Noted: No No Abnormalities Noted: No Moisture Temperature / Pain Figueira, Lisa-Marie  L. ([id number]) No Abnormalities Noted: No Temperature: No Abnormality Maceration: Yes Tenderness on Palpation: Yes Wound Preparation Ulcer Cleansing: Rinsed/Irrigated with Saline Topical Anesthetic Applied: Other: lidocaine 4%, Treatment Notes Wound #10 (Right, Dorsal Foot) 1. Cleansed with: Clean wound with Normal Saline 2. Anesthetic Topical Lidocaine 4% cream to wound bed prior to debridement 3. Peri-wound Care: Skin Prep 4. Dressing Applied: Aquacel Ag 5. Secondary Dressing Applied Bordered Foam Dressing Dry Gauze Electronic Signature(s) Signed: 05/05/2017 4:18:12 PM By: [name] Entered By: [name] on 05/05/2017 16:18:12 [place]ff[place], [name] ([id number]) -------------------------------------------------------------------------------- Wound Assessment Details Patient Name: Stepp, Aasiyah L. Date of Service: 05/05/2017 1:15 PM Medical Record Number: [id number] Patient Account Number: [id number] Date of Birth/Sex: 06/01/1921 (83 y.o. Female) Treating RN: [name], [name] Primary Care Eleaner Dibartolo: [name] Other Clinician: Referring Krimson Massmann: [name] Treating Sasuke Yaffe/Extender: [name] Weeks in Treatment: 21 Wound Status Wound Number: 11 Primary  Pressure Ulcer Etiology: Wound Location: Right Metatarsal head fifth - Plantar Wound Open Wounding Event: Pressure Injury Status: Date Acquired: 01/22/2017 Comorbid Cataracts, Anemia, Hypertension, History of Weeks Of Treatment: 14 History: pressure wounds, Osteoarthritis, Dementia Clustered Wound: No Wound Measurements Length: (cm) 0.2 Width: (cm) 0.2 Depth: (cm) 0.2 Area: (cm) 0.031 Volume: (cm) 0.006 % Reduction in Area: 75.4% % Reduction in Volume: 84.2% Epithelialization: None Tunneling: No Undermining: No Wound Description Classification: Category/Stage II Wound Margin: Distinct, outline attached Exudate Amount: Medium Exudate Type: Serous Exudate Color: amber Foul Odor After Cleansing: No Slough/Fibrino Yes Wound Bed Granulation Amount: Large (67-100%) Granulation Quality: Pink Necrotic Amount: Small (1-33%) Necrotic Quality: Adherent Slough Periwound Skin Texture Texture Color No Abnormalities Noted: No No Abnormalities Noted: No Moisture Temperature / Pain No Abnormalities Noted: No Temperature: No Abnormality Tenderness on Palpation: Yes Wound Preparation Ulcer Cleansing: Rinsed/Irrigated with Saline Topical Anesthetic Applied: Other: lidocaine 4%, Treatment Notes Wound #11 (Right, Plantar Metatarsal head fifth) 1. Cleansed with: Clean wound with Normal Saline 2. Anesthetic Tripp, Sarabeth L. ([id number]) Topical Lidocaine 4% cream to wound bed prior to debridement 3. Peri-wound Care: Skin Prep 4. Dressing Applied: Aquacel Ag 5. Secondary Dressing Applied Bordered Foam Dressing Dry Gauze Electronic Signature(s) Signed: 05/05/2017 4:18:47 PM By: [name] Entered By: [name] on 05/05/2017 16:18:46 [place], [name] ([id number]) -------------------------------------------------------------------------------- Wound Assessment Details Patient Name: [name], [name] Date of Service: 05/05/2017 1:15 PM Medical Record  Number: [id number] Patient Account Number: [id number] Date of Birth/Sex: Nov 10, 1920 (83 y.o. Female) Treating RN: [name], [name] Primary Care Curley Hogen: [name] Other Clinician: Referring Lochlan Grygiel: [name] Treating Latika Kronick/Extender: [name] Weeks in Treatment: 21 Wound Status Wound Number: 13 Primary Pressure Ulcer Etiology: Wound Location: Sacrum Wound Open Wounding Event: Pressure Injury Status: Date Acquired: 03/20/2016 Comorbid Cataracts, Anemia, Hypertension, History of Weeks Of Treatment: 12 History: pressure wounds, Osteoarthritis, Dementia Clustered Wound: No Photos Photo Uploaded By: [name] on 05/06/2017 14:13:01 Wound Measurements Length: (cm) 0.4 Width: (cm) 0.5 Depth: (cm) 0.8 Area: (cm) 0.157 Volume: (cm) 0.126 % Reduction in Area: 42.9% % Reduction in Volume: 54.2% Epithelialization: None Tunneling: No Undermining: No Wound Description Classification: Category/Stage IV Wound Margin: Distinct, outline attached Exudate Amount: Large Exudate Type: Serous Exudate Color: amber Foul Odor After Cleansing: No Slough/Fibrino Yes Wound Bed Granulation Amount: Large (67-100%) Granulation Quality: Red Necrotic Amount: Small (1-33%) Necrotic Quality: Adherent Slough Periwound Skin Texture Texture Color No Abnormalities Noted: No No Abnormalities Noted: No Moisture Temperature / Pain [name], Indra L. ([id number]) No Abnormalities Noted: No Temperature: No Abnormality Tenderness on Palpation: Yes Wound Preparation Ulcer Cleansing: Rinsed/Irrigated  with Saline Topical Anesthetic Applied: Other: lidocaine 4%, Treatment Notes Wound #13 (Sacrum) 1. Cleansed with: Clean wound with Normal Saline 2. Anesthetic Topical Lidocaine 4% cream to wound bed prior to debridement 3. Peri-wound Care: Skin Prep 4. Dressing Applied: Iodoform packing Gauze 5. Secondary Dressing Applied Bordered Foam Dressing Dry  Gauze Electronic Signature(s) Signed: 05/05/2017 4:19:14 PM By: [name] MullingPinkerton, [name] Entered By: [name] MullingPinkerton, [name] on 05/05/2017 16:19:14 [name], [name] SeatsELIZABETH L. ([id number]) -------------------------------------------------------------------------------- Wound Assessment Details Patient Name: Clayton, Karess L. Date of Service: 05/05/2017 1:15 PM Medical Record Number: [id number] Patient Account Number: [id number] Date of Birth/Sex: Nov 04, 1920 (83 y.o. Female) Treating RN: [name] CordiaPinkerton, [name] Primary Care Derrica Sieg: [name] HasteBADGER, [name] Other Clinician: Referring Laveah Gloster: [name] HasteBADGER, [name] Treating Makiah Clauson/Extender: Maxwell CaulOBSON, [name] G Weeks in Treatment: 21 Wound Status Wound Number: 14 Primary Pressure Ulcer Etiology: Wound Location: Left Gluteus Wound Healed - Epithelialized Wounding Event: Pressure Injury Status: Date Acquired: 03/10/2017 Comorbid Cataracts, Anemia, Hypertension, History of Weeks Of Treatment: 8 History: pressure wounds, Osteoarthritis, Dementia Clustered Wound: No Photos Photo Uploaded By: [name] MullingPinkerton, [name] on 05/06/2017 14:13:02 Wound Measurements Length: (cm) 0 Width: (cm) 0 Depth: (cm) 0 Area: (cm) 0 Volume: (cm) 0 % Reduction in Area: 100% % Reduction in Volume: 100% Epithelialization: Large (67-100%) Tunneling: No Undermining: No Wound Description Classification: Category/Stage II Wound Margin: Flat and Intact Exudate Amount: None Present Foul Odor After Cleansing: No Slough/Fibrino No Wound Bed Granulation Amount: None Present (0%) Necrotic Amount: None Present (0%) Periwound Skin Texture Texture Color No Abnormalities Noted: No No Abnormalities Noted: No Moisture Temperature / Pain No Abnormalities Noted: No Temperature: No Abnormality Wound Preparation Ulcer Cleansing: Rinsed/Irrigated with Saline Topical Anesthetic Applied: None Coralee RudSUMMERS, Donnamae L. ([id number]) Electronic Signature(s) Signed: 05/06/2017 4:19:36 PM By: [name] MullingPinkerton,  [name] Entered By: [name] MullingPinkerton, [name] on 05/05/2017 14:17:27 Kennan, [name] SeatsELIZABETH L. ([id number]) -------------------------------------------------------------------------------- Wound Assessment Details Patient Name: Tuley, Cheridan L. Date of Service: 05/05/2017 1:15 PM Medical Record Number: [id number] Patient Account Number: [id number] Date of Birth/Sex: Nov 04, 1920 (83 y.o. Female) Treating RN: [name] CordiaPinkerton, [name] Primary Care Kingston Guiles: [name] HasteBADGER, [name] Other Clinician: Referring Endora Teresi: [name] HasteBADGER, [name] Treating Javid Kemler/Extender: Maxwell CaulOBSON, [name] G Weeks in Treatment: 21 Wound Status Wound Number: 15 Primary Pressure Ulcer Etiology: Wound Location: Back - Midline Wound Open Wounding Event: Pressure Injury Status: Date Acquired: 03/10/2017 Comorbid Cataracts, Anemia, Hypertension, History of Weeks Of Treatment: 8 History: pressure wounds, Osteoarthritis, Dementia Clustered Wound: No Photos Photo Uploaded By: [name] MullingPinkerton, [name] on 05/06/2017 14:13:42 Wound Measurements Length: (cm) 0.8 Width: (cm) 0.3 Depth: (cm) 0.4 Area: (cm) 0.188 Volume: (cm) 0.075 % Reduction in Area: 80% % Reduction in Volume: 20.2% Epithelialization: None Tunneling: No Undermining: No Wound Description Classification: Category/Stage II Wound Margin: Flat and Intact Exudate Amount: Large Exudate Type: Serous Exudate Color: amber Foul Odor After Cleansing: No Slough/Fibrino Yes Wound Bed Granulation Amount: Large (67-100%) Granulation Quality: Red, Pink Necrotic Amount: Small (1-33%) Necrotic Quality: Adherent Slough Periwound Skin Texture Texture Color No Abnormalities Noted: No No Abnormalities Noted: No Moisture Temperature / Pain Heard, Safiyah L. ([id number]) No Abnormalities Noted: No Temperature: No Abnormality Tenderness on Palpation: Yes Wound Preparation Ulcer Cleansing: Rinsed/Irrigated with Saline Topical Anesthetic Applied: Other: lidocaine 4%, Treatment Notes Wound  #15 (Midline Back) 1. Cleansed with: Clean wound with Normal Saline 2. Anesthetic Topical Lidocaine 4% cream to wound bed prior to debridement 3. Peri-wound Care: Skin Prep 4. Dressing Applied: Iodoform packing Gauze 5. Secondary Dressing Applied Bordered Foam Dressing Dry Gauze Electronic Signature(s) Signed: 05/05/2017 4:19:40 PM By: [name] MullingPinkerton, [name] Entered By: [name] MullingPinkerton, [name] on 05/05/2017 16:19:40 Rubendall, Alvin L. (  [id number]) -------------------------------------------------------------------------------- Wound Assessment Details Patient Name: MEILA, BERKE. Date of Service: 05/05/2017 1:15 PM Medical Record Number: [id number] Patient Account Number: [id number] Date of Birth/Sex: Jan 02, 1921 (83 y.o. Female) Treating RN: [name], [name] Primary Care Calissa Swenor: [name] Other Clinician: Referring Hayato Guaman: [name] Treating Foster Frericks/Extender: [name] Weeks in Treatment: 21 Wound Status Wound Number: 5 Primary Pressure Ulcer Etiology: Wound Location: Right Calcaneus Wound Open Wounding Event: Pressure Injury Status: Date Acquired: 10/09/2016 Comorbid Cataracts, Anemia, Hypertension, History of Weeks Of Treatment: 21 History: pressure wounds, Osteoarthritis, Dementia Clustered Wound: Yes Pending Amputation On Presentation Wound Measurements Length: (cm) 5.5 Width: (cm) 4.4 Depth: (cm) 0.4 Area: (cm) 19.007 Volume: (cm) 7.603 % Reduction in Area: 59.7% % Reduction in Volume: -61.4% Epithelialization: None Tunneling: No Undermining: No Wound Description Classification: Category/Stage IV Wound Margin: Distinct, outline attached Exudate Amount: Large Exudate Type: Serosanguineous Exudate Color: red, brown Foul Odor After Cleansing: Yes Due to Product Use: No Slough/Fibrino Yes Wound Bed Granulation Amount: Medium (34-66%) Exposed Structure Granulation Quality: Red Fascia Exposed: Yes Necrotic Amount: Medium  (34-66%) Fat Layer (Subcutaneous Tissue) Exposed: Yes Necrotic Quality: Eschar, Adherent Slough Tendon Exposed: Yes Muscle Exposed: Yes Necrosis of Muscle: No Bone Exposed: Yes Periwound Skin Texture Texture Color No Abnormalities Noted: No No Abnormalities Noted: No Moisture Temperature / Pain No Abnormalities Noted: No Temperature: No Abnormality Maceration: Yes Tenderness on Palpation: Yes Wound Preparation Ulcer Cleansing: Rinsed/Irrigated with Saline Topical Anesthetic Applied: Other: lidocaine 4%, Treatment Notes Trebilcock, Rue L. ([id number]) Wound #5 (Right Calcaneus) 1. Cleansed with: Clean wound with Normal Saline 2. Anesthetic Topical Lidocaine 4% cream to wound bed prior to debridement 4. Dressing Applied: Aquacel Ag 5. Secondary Dressing Applied Dry Gauze Kerlix/Conform 7. Secured with Tape Notes heel cup Electronic Signature(s) Signed: 05/05/2017 4:20:03 PM By: [name] Entered By: [name] on 05/05/2017 16:20:03 Blacketer, [name] ([id number]) -------------------------------------------------------------------------------- Wound Assessment Details Patient Name: Rackham, Nathan L. Date of Service: 05/05/2017 1:15 PM Medical Record Number: [id number] Patient Account Number: [id number] Date of Birth/Sex: [birth year] (83 y.o. Female) Treating RN: [name], [name] Primary Care Airiana Elman: [name] Other Clinician: Referring Skylynn Burkley: [name] Treating Melizza Kanode/Extender: [name] Weeks in Treatment: 21 Wound Status Wound Number: 6 Primary Pressure Ulcer Etiology: Wound Location: Left Calcaneus Wound Open Wounding Event: Pressure Injury Status: Date Acquired: 10/09/2016 Comorbid Cataracts, Anemia, Hypertension, History of Weeks Of Treatment: 21 History: pressure wounds, Osteoarthritis, Dementia Clustered Wound: No Pending Amputation On Presentation Wound Measurements Length: (cm) 1.5 Width: (cm)  1.2 Depth: (cm) 0.3 Area: (cm) 1.414 Volume: (cm) 0.424 % Reduction in Area: 91.6% % Reduction in Volume: 74.9% Epithelialization: None Tunneling: No Undermining: No Wound Description Classification: Category/Stage II Wound Margin: Distinct, outline attached Exudate Amount: Large Exudate Type: Serosanguineous Exudate Color: red, brown Foul Odor After Cleansing: No Slough/Fibrino Yes Wound Bed Granulation Amount: Medium (34-66%) Granulation Quality: Red Necrotic Amount: Medium (34-66%) Necrotic Quality: Eschar, Adherent Slough Periwound Skin Texture Texture Color No Abnormalities Noted: No No Abnormalities Noted: No Moisture Temperature / Pain No Abnormalities Noted: No Temperature: No Abnormality Maceration: Yes Tenderness on Palpation: Yes Wound Preparation Ulcer Cleansing: Rinsed/Irrigated with Saline Topical Anesthetic Applied: Other: lidocaine 4%, Treatment Notes Wound #6 (Left Calcaneus) 1. Cleansed with: Clean wound with Normal Saline Enrico, Taydem L. ([id number]) 2. Anesthetic Topical Lidocaine 4% cream to wound bed prior to debridement 4. Dressing Applied: Aquacel Ag 5. Secondary Dressing Applied Dry Gauze Kerlix/Conform 7. Secured with Tape Notes heel cup Electronic Signature(s) Signed: 05/05/2017 4:20:31 PM By: [name] Entered  By: [name] on 05/05/2017 16:20:30 Kaminski, [name] ([id number]) -------------------------------------------------------------------------------- Wound Assessment Details Patient Name: TAI, SYFERT L. Date of Service: 05/05/2017 1:15 PM Medical Record Number: [id number] Patient Account Number: [id number] Date of Birth/Sex: 28-Nov-1920 (83 y.o. Female) Treating RN: [name], [name] Primary Care Amando Chaput: [name] Other Clinician: Referring Arnette Driggs: [name] Treating Kendrea Cerritos/Extender: [name] Weeks in Treatment: 21 Wound Status Wound Number: 8 Primary Pressure  Ulcer Etiology: Wound Location: Left Lower Leg - Posterior Wound Open Wounding Event: Pressure Injury Status: Date Acquired: 10/09/2016 Comorbid Cataracts, Anemia, Hypertension, History of Weeks Of Treatment: 21 History: pressure wounds, Osteoarthritis, Dementia Clustered Wound: Yes Pending Amputation On Presentation Wound Measurements Length: (cm) 1.3 Width: (cm) 0.5 Depth: (cm) 0.2 Area: (cm) 0.511 Volume: (cm) 0.102 % Reduction in Area: 95.7% % Reduction in Volume: 98.6% Epithelialization: None Tunneling: No Undermining: No Wound Description Classification: Category/Stage IV Wound Margin: Distinct, outline attached Exudate Amount: Large Exudate Type: Serosanguineous Exudate Color: red, brown Foul Odor After Cleansing: No Slough/Fibrino Yes Wound Bed Granulation Amount: Small (1-33%) Granulation Quality: Red Necrotic Amount: Large (67-100%) Necrotic Quality: Eschar, Adherent Slough Periwound Skin Texture Texture Color No Abnormalities Noted: No No Abnormalities Noted: No Moisture Temperature / Pain No Abnormalities Noted: No Temperature: No Abnormality Tenderness on Palpation: Yes Wound Preparation Ulcer Cleansing: Rinsed/Irrigated with Saline Topical Anesthetic Applied: Other: lidocaine 4%, Treatment Notes Wound #8 (Left, Posterior Lower Leg) 1. Cleansed with: Clean wound with Normal Saline Hafley, Scotty L. ([id number]) 2. Anesthetic Topical Lidocaine 4% cream to wound bed prior to debridement 3. Peri-wound Care: Skin Prep 4. Dressing Applied: Aquacel Ag 5. Secondary Dressing Applied Bordered Foam Dressing Dry Gauze Electronic Signature(s) Signed: 05/05/2017 4:21:09 PM By: [name] Entered By: [name] on 05/05/2017 16:21:08 Eaker, [name] -------------------------------------------------------------------------------- Vitals Details Patient Name: Davidoff, Karne L. Date of Service: 05/05/2017 1:15  PM Medical Record Number: [id number] Patient Account Number: [id number] Date of Birth/Sex: 10/11/1920 (83 y.o. Female) Treating RN: [name], [name] Primary Care Tychelle Purkey: [name] Other Clinician: Referring Josclyn Rosales: [name] Treating Rozena Fierro/Extender: [name] Sayre in Treatment: 21 Vital Signs Time Taken: 13:38 Temperature (F): 97.7 Height (in): 69 Pulse (bpm): 81 Weight (lbs): 105 Respiratory Rate (breaths/min): 16 Body Mass Index (BMI): 15.5 Blood Pressure (mmHg): 140/71 Reference Range: 80 - 120 mg / dl Electronic Signature(s) Signed: 05/06/2017 4:19:36 PM By: [name] Entered By: [name] on 05/05/2017 13:39:00

## 2017-05-10 NOTE — Progress Notes (Addendum)
[name], [name] ([id number]) Visit Report for 05/05/2017 HPI Details Patient Name: [name], [name]. Date of Service: 05/05/2017 1:15 PM Medical Record Number: [id number] Patient Account Number: [id number] Date of Birth/Sex: 10-23-1920 (83 y.o. Female) Treating RN: Ashok Cordia, Debi Primary Care Provider: [name] Other Clinician: Referring Provider: [name] Treating Provider/Extender: Altamese [place] in Treatment: 21 History of Present Illness HPI Description: 03/24/16; this is an elderly 83 year old woman with advanced dementia who was hospitalized from 5/8 through 5/13. At that point she had Proteus sepsis felt to be secondary to a UTI the. Acute kidney injury and delirium. Her son who is her primary caregiver says she came home from the hospital with wounds on her right lower extremity and apparently her right buttock as well. There is no mention on the hospital discharge summary of problems. She has been having [facility] and have been using silver alginate based dressings. Apparently the area on the right buttock is healing and the son stated we did not need to become involved with that. In any case the patient has for wounds to on the right heel and 2 on the posterior lateral right calf, the latter of which is not a usual pressure area however that is the history. She is apparently eating and drinking fairly well. Takes ensure well. She does not have any pressure-relief surface at home. I have reviewed her lab work from the hospital. Her admission albumin was 3.4 on 5/8 04/01/16; patient arrives with wounds looking much the same as last week. She has an extensive pressure area over her right heel and to small but deep wounds on the lateral aspect of her right lower leg. These wounds are not connected. Although there are advertised his pressure ulcers these 2 wounds are not usual for pressure ulcerations. We have not looked at the pressure ulceration  on her buttock at the request of the patient's son who is the primary caregiver 04/08/16; wounds are stable to improved this week. Culture I did of the lower wound on her right lateral leg grew methicillin sensitive staph aureus she is on Septra. We are using silver alginate to all the wounds. 04/15/16; the 2 small areas on the lateral aspect of this lady's right leg continued to improve however the heel is once again covered with callus, residual alginate, acrotic material all of which requires a difficult debridement. There continues to be purulent material under this surface. This is in spite of a week of Septra that I gave him for MSSA 04/22/16 Patient today presents for follow-up evaluation. She is seen with her son in the office at this point in time she does have Alzheimer's. The good news is her wounds appeared to be somewhat smaller with current therapy. At this point in time the infection which was cultured previously appears to have improved in regard to the right heel wound. There is no purulent drainage at this point in time and a good portion of the wound actually is eschar covered and dry with a medial portion actually open to granulation with very little slough covering. She really has no significant discomfort with palpation manipulation of the wound at this point in time. 04/28/16 patient presents today for follow-up evaluation concerning her right lateral calf wound as well as right heel wound. she has not really been complaining this for as pain is concerned according to her son seen with her in the office at this point in time today. She seems to show no interval signs  or symptoms of infection overall has been tolerating the dressing changes well. She does have Alzheimer's therefore is not able to rate her pain although she does respond with an affirmative that she hurts when I press over the heel region. 05/05/16; the area on the lateral aspect of her right calf is healed. The  right heel as 2 small spots that are still open and very close to resolving as well using Aquacel Ag under border foam, 05/20/16; the area on the lateral aspect of her right calf remains closed. The 2 small areas on her right heel are also closed. READMISSION 12/09/16; this is a 83 year old woman with advanced dementia that we cared for in the clinic from September to the beginning of November 2017. At that point she had a right heel, right lateral calf and right buttock wound all felt to be secondary to pressure. These eventually closed over. The patient's son who is the primary caregiver states that over the last 2 months she is developed bilateral lower extremity pressure ulcers. [facility] and they have been applying Medihoney and an alginate. These were apparently all pressure ulcers today including area on the left posterior calf. Apparently they were keeping her leg elevated on Kleve, [name] ([id number]) the pillow which contributed to this. The patient has advanced dementia, is nonambulatory and not able to move herself in bed. There is apparently not a nutritional issue. [facility] has [facility]. 12/16/16; patient was readmitted to the clinic last week with multiple wounds including the medial and lateral aspects of the left calcaneus left posterior calf right calcaneus. We've been using Iodoflex, dressing change by home health. Her son also reports that she has had trouble breathing which she attributes to the low air loss mattress we have for pressure relief. He states that when he took her out of bed and put her in her chair breathing improved. As of Monday he took the low air loss mattress off the bed and states that she is able to go to bed without shortness of breath. His description sounded a lot like orthopnea. He is asking [place] to order a gel mattress overlay. According to her son she does not aspirate at least not frequently 12/23/16; the patient is  reviewed today for multiple wounds on her lower extremities. We have been using Iodoflex dressing change by home health. Last week the patient was Cheyne-Stokes in with an x-ray that look like heart failure with small bilateral pleural effusions. I gave her Lasix and some potassium. She saw her primary physician earlier this week who did not add anything to this. Her son states she is doing better she has not had any of the orthopneic episodes since Monday 01/06/17; 3 week followup. using iodoflex. She has a large wound on the left lateral calf with exposed tendon, and necrotic wound over the left medial heel. Wound over the left lateral malleolus appears to be improved. On the right side necrotic wound on the right medial heel, wound on the right lateral heel looks somewhat better and the area on the left anterior lateral calf looks as though it's progressing towards closure. The patient may have arterial issues however she is too far along in terms of her dementia to undergo testing. Her son claims she is eating well and there offloading this. 01/27/17 on evaluation today patient appears to be doing somewhat worse with new wounds over the right fifth metatarsal region laterally as well as the dorsal  right foot. Both of these appear to be deep tissue injuries which have now opened over the two weeks since we last saw her. She also has a mildly tissue injury in the fifth metatarsal region on the left although this does not appear to be worsening and in fact I think wound up being okay. Nonetheless she does have skin in which is loose of the right heel as well as the left posterior calf region. She wears the [employer] when she is in bed but has not been wearing them when she is in her wheelchair. 02/03/17; culture that was done last week of the right dorsal foot grew both methicillin sensitive staph aureus and Pseudomonas. She was initially thought to have Cipro however that up apparently increases  QT along with Aricept was not aware that Aricept did this. This was changed to cefdinir and Septra however the patient is apparently allergic to Septra even though it is not listed in our records. The patient started the cefdinir yesterday. In spite of the delay of antibiotics the area on the right dorsal foot actually looks a lot better than last week with a thick covering eschar and necrotic tissue but no surrounding erythema or drainage. I also took the opportunity today to review her vascular status. When she was in our clinic the first time her ABI on the left was 0.9 and on the right noncompressible. It is still noncompressible today. She has not had imaging studies she has the following wounds including the right dorsal ankle/foot, right heel and right fifth metatarsal head. On the left side she has the medial left heel the left lateral malleolus and the left posterior lateral calf. We have been using Iodoflex on the right and Prisma on the left 02/10/17; in addition to the bilateral foot and calf wounds. It is apparently become clear that the patient actually has a small stage II wound on her coccyx and the right. ishium. They have been apparently therefore many months perhaps total of 8 or 9. Home health is been taking care of these and recently decided that he needed orders. With regards to the lower extremity wounds the really worrisome areas here are the heel on the right. X-rays I ordered last week showed a possible area of cortical erosion involving the distal lateral fifth metatarsal head question early osteomyelitis. There was no common and on the right heel which was the worrisome area. The left tib-fib showed no osseous abnormality. Left foot showed no acute osseous abnormality she continues to have wounds on the right heel, right anterior foot, right lateral fifth metatarsal head, left medial heel, left lateral heel, left lateral calf as well as the new wounds at least as noted  above 03/10/17: I see this patient with advanced dementia monthly on a palliative basis. Presenting with 11 identifiable wounds today. ELZA, VARRICCHIO ([id number]) Per her son who is the primary caregiver she eats and drinks well. He still brings her to doctor's appointments and brings her out for hair appointments. I previously suggested hospice care to this patient's son however he is not willing to listen to this at this point 04/07/17; this is a patient with end-stage dementia that I see for wound care on a palliative basis monthly. Her major continued problem is a right heel wound that is draining necrotic and has exposed bone. With considerable difficulty today I managed to obtain a piece of bone for bone culture. Although the patient's son is never really followed me on  the idea of palliative wound care he is certainly not of totally hospice mindset. She also has wounds on the oRight anterior foot with exposed tendon right fifth metatarsal head, left lateral leg which is mostly healed, left heel, roughly T12 on her back and a small probing hole on her lower sacrum 05/05/17; this is a patient I see monthly. She is advanced dementia multiple pressure ulcers. From our clinic's point of view this is a palliative situation and I told this to her son brings her although I'm not sure how well he sees it. He does heroic care for his mother however oThe major issue here had been her right heel. Deep tissue culture that I did last time showed methicillin sensitive staph aureus and Enterococcus faecalis. She received a course of Augmentin and he'll actually still looks somewhat better. Electronic Signature(s) Signed: 05/05/2017 5:51:56 PM By: Baltazar Najjar MD Entered By: Baltazar Najjar on 05/05/2017 15:57:25 Micco, [name] ([id number]) -------------------------------------------------------------------------------- Physical Exam Details Patient Name: Chevere, Nely L. Date of  Service: 05/05/2017 1:15 PM Medical Record Number: [id number] Patient Account Number: [id number] Date of Birth/Sex: 1921-02-09 (83 y.o. Female) Treating RN: Ashok Cordia, Debi Primary Care Provider: [name] Other Clinician: Referring Provider: [name] Treating Provider/Extender: [name] [place] in Treatment: 21 Constitutional Sitting or standing Blood Pressure is within target range for patient.. Pulse regular and within target range for patient.Marland Kitchen Respirations regular, non-labored and within target range.. Temperature is normal and within the target range for the patient.Marland Kitchen appears in no distress. Respiratory Respiratory effort is easy and symmetric bilaterally. Rate is normal at rest and on room air.. Cardiovascular does not appear dehydrated. Gastrointestinal (GI) Abdomen is soft and non-distended without masses or tenderness. Bowel sounds active in all quadrants.. No liver or spleen enlargement or tenderness.. Lymphatic none palpable in the popliteal or inguinal area. Psychiatric advanced dementia. Notes Exam; large necrotic wound on the right heel looks better than last time. Noted bright and it was necessary. oRight anterior foot wound looks as though it's contracted oLeft heel wound appears stable oLeft anterior leg wound also appears stable o2 areas one on the mid back and one on the lower sacrum. There was purulent drainage in the area of lower sacrum I cultured this. Superior area also has some depth. We have been using iodoform packing both of these areas Electronic Signature(s) Signed: 05/05/2017 5:51:56 PM By: Baltazar Najjar MD Entered By: Baltazar Najjar on 05/05/2017 16:17:07 [place], [name] ([id number]) -------------------------------------------------------------------------------- Physician Orders Details Patient Name: [name], [name] Date of Service: 05/05/2017 1:15 PM Medical Record Number: [id number] Patient Account Number:  [id number] Date of Birth/Sex: 1921/01/02 (83 y.o. Female) Treating RN: Ashok Cordia, Debi Primary Care Provider: [name] Other Clinician: Referring Provider: [name] Treating Provider/Extender: Altamese [place] in Treatment: 21 Verbal / Phone Orders: Yes Clinician: Ashok Cordia, Debi Read Back and Verified: Yes Diagnosis Coding Wound Cleansing Wound #10 Right,Dorsal Foot o Clean wound with Normal Saline. o Cleanse wound with mild soap and water Wound #11 Right,Plantar Metatarsal head fifth o Clean wound with Normal Saline. o Cleanse wound with mild soap and water Wound #13 Sacrum o Clean wound with Normal Saline. o Cleanse wound with mild soap and water Wound #15 Midline Back o Clean wound with Normal Saline. o Cleanse wound with mild soap and water Wound #5 Right Calcaneus o Clean wound with Normal Saline. o Cleanse wound with mild soap and water Wound #6 Left Calcaneus o Clean wound with Normal Saline. o Cleanse wound with  mild soap and water Wound #8 Left,Posterior Lower Leg o Clean wound with Normal Saline. o Cleanse wound with mild soap and water Anesthetic Wound #10 Right,Dorsal Foot o Topical Lidocaine 4% cream applied to wound bed prior to debridement - for clinic use Wound #11 Right,Plantar Metatarsal head fifth o Topical Lidocaine 4% cream applied to wound bed prior to debridement - for clinic use Wound #13 Sacrum o Topical Lidocaine 4% cream applied to wound bed prior to debridement - for clinic use Wound #15 Midline Back o Topical Lidocaine 4% cream applied to wound bed prior to debridement - for clinic use Wound #5 Right Calcaneus o Topical Lidocaine 4% cream applied to wound bed prior to debridement - for clinic use [name], [name] ([id number]) Wound #6 Left Calcaneus o Topical Lidocaine 4% cream applied to wound bed prior to debridement - for clinic use Wound #8 Left,Posterior Lower Leg o  Topical Lidocaine 4% cream applied to wound bed prior to debridement - for clinic use Skin Barriers/Peri-Wound Care Wound #10 Right,Dorsal Foot o Skin Prep Wound #11 Right,Plantar Metatarsal head fifth o Skin Prep Wound #13 Sacrum o Skin Prep Wound #15 Midline Back o Skin Prep Wound #8 Left,Posterior Lower Leg o Skin Prep Primary Wound Dressing Wound #10 Right,Dorsal Foot o Aquacel Ag Wound #11 Right,Plantar Metatarsal head fifth o Aquacel Ag Wound #13 Sacrum o Iodoform packing Gauze - 1'4 inch Wound #15 Midline Back o Iodoform packing Gauze - 1'4 inch Wound #5 Right Calcaneus o Aquacel Ag Wound #6 Left Calcaneus o Aquacel Ag Wound #8 Left,Posterior Lower Leg o Aquacel Ag Secondary Dressing Wound #10 Right,Dorsal Foot o Dry Gauze o Boardered Foam Dressing Wound #11 Right,Plantar Metatarsal head fifth o Dry Gauze o Boardered Foam Dressing Wound #13 Sacrum o Dry Gauze [name], [name] ([id number]) o Boardered Foam Dressing Wound #15 Midline Back o Dry Gauze o Boardered Foam Dressing Wound #5 Right Calcaneus o Dry Gauze o Conform/Kerlix o Other - allevyn heel cup, stretch netting #4 Wound #6 Left Calcaneus o Dry Gauze o Conform/Kerlix o Other - allevyn heel cup, stretch netting #4 Wound #8 Left,Posterior Lower Leg o Dry Gauze o Boardered Foam Dressing Dressing Change Frequency Wound #10 Right,Dorsal Foot o Change dressing every other day. Wound #11 Right,Plantar Metatarsal head fifth o Change dressing every other day. Wound #13 Sacrum o Change dressing every other day. Wound #15 Midline Back o Change dressing every day. Wound #5 Right Calcaneus o Change dressing every other day. Wound #6 Left Calcaneus o Change dressing every other day. Wound #8 Left,Posterior Lower Leg o Change dressing every other day. Follow-up Appointments Wound #10 Right,Dorsal Foot o Other: - 30  days Wound #11 Right,Plantar Metatarsal head fifth o Other: - 30 days Wound #13 Sacrum o Other: - 30 days Wound #15 Midline Back o Other: - 30 days Wound #5 Right Calcaneus o Other: - 30 days [name], [name] ([id number]) Wound #6 Left Calcaneus o Other: - 30 days Wound #8 Left,Posterior Lower Leg o Other: - 30 days Edema Control Wound #10 Right,Dorsal Foot o Elevate legs to the level of the heart and pump ankles as often as possible Wound #11 Right,Plantar Metatarsal head fifth o Elevate legs to the level of the heart and pump ankles as often as possible Wound #5 Right Calcaneus o Elevate legs to the level of the heart and pump ankles as often as possible Wound #6 Left Calcaneus o Elevate legs to the level of the heart and pump ankles as often  as possible Wound #8 Left,Posterior Lower Leg o Elevate legs to the level of the heart and pump ankles as often as possible Off-Loading Wound #10 Right,Dorsal Foot o Turn and reposition every 2 hours o Other: - wear sage boots Wound #11 Right,Plantar Metatarsal head fifth o Turn and reposition every 2 hours o Other: - wear sage boots Wound #13 Sacrum o Turn and reposition every 2 hours o Other: - wear sage boots Wound #15 Midline Back o Turn and reposition every 2 hours o Turn and reposition every 2 hours o Other: - wear sage boots o Other: - wear sage boots Wound #5 Right Calcaneus o Turn and reposition every 2 hours o Other: - wear sage boots Wound #6 Left Calcaneus o Turn and reposition every 2 hours o Other: - wear sage boots Wound #8 Left,Posterior Lower Leg o Turn and reposition every 2 hours o Other: - wear sage boots Additional Orders / Instructions Wound #10 Right,Dorsal Foot [name], [name] ([id number]) o Increase protein intake. o Other: - left 5th plantar metatarsal head paint with betadine every day (deep tissue injury) place foam to the top of  the left foot for padded protection, add bordered foam dressing to midline back to help with pressure relief Wound #11 Right,Plantar Metatarsal head fifth o Increase protein intake. o Other: - left 5th plantar metatarsal head paint with betadine every day (deep tissue injury) place foam to the top of the left foot for padded protection, add bordered foam dressing to midline back to help with pressure relief Wound #13 Sacrum o Increase protein intake. o Other: - left 5th plantar metatarsal head paint with betadine every day (deep tissue injury) place foam to the top of the left foot for padded protection, add bordered foam dressing to midline back to help with pressure relief Wound #15 Midline Back o Increase protein intake. o Other: - left 5th plantar metatarsal head paint with betadine every day (deep tissue injury) place foam to the top of the left foot for padded protection, add bordered foam dressing to midline back to help with pressure relief Wound #5 Right Calcaneus o Increase protein intake. o Other: - left 5th plantar metatarsal head paint with betadine every day (deep tissue injury) place foam to the top of the left foot for padded protection, add bordered foam dressing to midline back to help with pressure relief Wound #6 Left Calcaneus o Increase protein intake. o Other: - left 5th plantar metatarsal head paint with betadine every day (deep tissue injury) place foam to the top of the left foot for padded protection, add bordered foam dressing to midline back to help with pressure relief Wound #8 Left,Posterior Lower Leg o Increase protein intake. o Other: - left 5th plantar metatarsal head paint with betadine every day (deep tissue injury) place foam to the top of the left foot for padded protection, add bordered foam dressing to midline back to help with pressure relief Home Health Wound #10 Right,Dorsal Foot o [facility] o Home Health Nurse may visit PRN to address patientos wound care needs. o FACE TO FACE ENCOUNTER: MEDICARE and MEDICAID PATIENTS: I certify that this patient is under my care and that I had a face-to-face encounter that meets the physician face-to-face encounter requirements with this patient on this date. The encounter with the patient was in whole or in part for the following MEDICAL CONDITION: (primary reason for Home Healthcare) MEDICAL NECESSITY: I certify, that based on  my findings, NURSING services are a medically necessary home health service. HOME BOUND STATUS: I certify that my clinical findings support that this patient is homebound (i.e., Due to illness or injury, pt requires aid of supportive devices such as crutches, cane, wheelchairs, walkers, the use of special transportation or the assistance of another person to leave their place of residence. There is a normal inability to leave the home and doing so requires considerable and taxing effort. Other absences are for medical reasons / religious services and are infrequent or of short duration when for other reasons). o If current dressing causes regression in wound condition, may D/C ordered dressing product/s and apply Normal Saline Moist Dressing daily until next Wound Healing Center / Other MD appointment. Notify Wound Healing Center of regression in wound condition at [phone]. TIEASHA, LARSEN ([id number]) o Please direct any NON-WOUND related issues/requests for orders to patient's Primary Care Physician Wound #11 Right,Plantar Metatarsal head fifth o [facility] o Home Health Nurse may visit PRN to address patientos wound care needs. o FACE TO FACE ENCOUNTER: MEDICARE and MEDICAID PATIENTS: I certify that this patient is under my care and that I had a face-to-face encounter that meets the physician face-to-face encounter requirements with this patient  on this date. The encounter with the patient was in whole or in part for the following MEDICAL CONDITION: (primary reason for Home Healthcare) MEDICAL NECESSITY: I certify, that based on my findings, NURSING services are a medically necessary home health service. HOME BOUND STATUS: I certify that my clinical findings support that this patient is homebound (i.e., Due to illness or injury, pt requires aid of supportive devices such as crutches, cane, wheelchairs, walkers, the use of special transportation or the assistance of another person to leave their place of residence. There is a normal inability to leave the home and doing so requires considerable and taxing effort. Other absences are for medical reasons / religious services and are infrequent or of short duration when for other reasons). o If current dressing causes regression in wound condition, may D/C ordered dressing product/s and apply Normal Saline Moist Dressing daily until next Wound Healing Center / Other MD appointment. Notify Wound Healing Center of regression in wound condition at [phone]. o Please direct any NON-WOUND related issues/requests for orders to patient's Primary Care Physician Wound #13 Sacrum o [facility] o Home Health Nurse may visit PRN to address patientos wound care needs. o FACE TO FACE ENCOUNTER: MEDICARE and MEDICAID PATIENTS: I certify that this patient is under my care and that I had a face-to-face encounter that meets the physician face-to-face encounter requirements with this patient on this date. The encounter with the patient was in whole or in part for the following MEDICAL CONDITION: (primary reason for Home Healthcare) MEDICAL NECESSITY: I certify, that based on my findings, NURSING services are a medically necessary home health service. HOME BOUND STATUS: I certify that my clinical findings support that this patient is homebound (i.e., Due to illness  or injury, pt requires aid of supportive devices such as crutches, cane, wheelchairs, walkers, the use of special transportation or the assistance of another person to leave their place of residence. There is a normal inability to leave the home and doing so requires considerable and taxing effort. Other absences are for medical reasons / religious services and are infrequent or of short duration when for other reasons). o If current dressing causes  regression in wound condition, may D/C ordered dressing product/s and apply Normal Saline Moist Dressing daily until next Wound Healing Center / Other MD appointment. Notify Wound Healing Center of regression in wound condition at [phone]. o Please direct any NON-WOUND related issues/requests for orders to patient's Primary Care Physician Wound #15 Midline Back o [facility] o Home Health Nurse may visit PRN to address patientos wound care needs. o FACE TO FACE ENCOUNTER: MEDICARE and MEDICAID PATIENTS: I certify that this patient is under my care and that I had a face-to-face encounter that meets the physician face-to-face encounter requirements with this patient on this date. The encounter with the patient was in whole or in part for the following MEDICAL CONDITION: (primary reason for Home Healthcare) MEDICAL NECESSITY: I certify, that based on my findings, NURSING services are a medically necessary home health service. HOME BOUND STATUS: I certify that my clinical findings support that this patient is homebound (i.e., Due to illness or injury, pt requires aid of supportive devices such as crutches, cane, wheelchairs, walkers, the use of special transportation or the assistance of another person to leave their place of residence. There is a normal inability to leave the home and doing so requires considerable and taxing effort. Other absences are for medical reasons / religious services and are  infrequent or of short duration when for other reasons). o If current dressing causes regression in wound condition, may D/C ordered dressing product/s and apply Normal Saline Moist Dressing daily until next Wound Healing Center / Other MD appointment. Notify Wound Healing Center of regression in wound condition at [phone]. o Please direct any NON-WOUND related issues/requests for orders to patient's Primary Care Physician Wound #5 Right Calcaneus o [facility] [name], [name] ([id number]) o Home Health Nurse may visit PRN to address patientos wound care needs. o FACE TO FACE ENCOUNTER: MEDICARE and MEDICAID PATIENTS: I certify that this patient is under my care and that I had a face-to-face encounter that meets the physician face-to-face encounter requirements with this patient on this date. The encounter with the patient was in whole or in part for the following MEDICAL CONDITION: (primary reason for Home Healthcare) MEDICAL NECESSITY: I certify, that based on my findings, NURSING services are a medically necessary home health service. HOME BOUND STATUS: I certify that my clinical findings support that this patient is homebound (i.e., Due to illness or injury, pt requires aid of supportive devices such as crutches, cane, wheelchairs, walkers, the use of special transportation or the assistance of another person to leave their place of residence. There is a normal inability to leave the home and doing so requires considerable and taxing effort. Other absences are for medical reasons / religious services and are infrequent or of short duration when for other reasons). o If current dressing causes regression in wound condition, may D/C ordered dressing product/s and apply Normal Saline Moist Dressing daily until next Wound Healing Center / Other MD appointment. Notify Wound Healing Center of regression in wound condition at  [phone]. o Please direct any NON-WOUND related issues/requests for orders to patient's Primary Care Physician Wound #6 Left Calcaneus o [facility] - [facility] o Home Health Nurse may visit PRN to address patientos wound care needs. o FACE TO FACE ENCOUNTER: MEDICARE and MEDICAID PATIENTS: I certify that this patient is under my care and that I had a face-to-face encounter that meets the  physician face-to-face encounter requirements with this patient on this date. The encounter with the patient was in whole or in part for the following MEDICAL CONDITION: (primary reason for Home Healthcare) MEDICAL NECESSITY: I certify, that based on my findings, NURSING services are a medically necessary home health service. HOME BOUND STATUS: I certify that my clinical findings support that this patient is homebound (i.e., Due to illness or injury, pt requires aid of supportive devices such as crutches, cane, wheelchairs, walkers, the use of special transportation or the assistance of another person to leave their place of residence. There is a normal inability to leave the home and doing so requires considerable and taxing effort. Other absences are for medical reasons / religious services and are infrequent or of short duration when for other reasons). o If current dressing causes regression in wound condition, may D/C ordered dressing product/s and apply Normal Saline Moist Dressing daily until next Wound Healing Center / Other MD appointment. Notify Wound Healing Center of regression in wound condition at [phone]. o Please direct any NON-WOUND related issues/requests for orders to patient's Primary Care Physician Wound #8 Left,Posterior Lower Leg o [facility] - [facility] o Home Health Nurse may visit PRN to address patientos wound care needs. o FACE TO FACE ENCOUNTER: MEDICARE and MEDICAID PATIENTS: I certify that this patient is  under my care and that I had a face-to-face encounter that meets the physician face-to-face encounter requirements with this patient on this date. The encounter with the patient was in whole or in part for the following MEDICAL CONDITION: (primary reason for Home Healthcare) MEDICAL NECESSITY: I certify, that based on my findings, NURSING services are a medically necessary home health service. HOME BOUND STATUS: I certify that my clinical findings support that this patient is homebound (i.e., Due to illness or injury, pt requires aid of supportive devices such as crutches, cane, wheelchairs, walkers, the use of special transportation or the assistance of another person to leave their place of residence. There is a normal inability to leave the home and doing so requires considerable and taxing effort. Other absences are for medical reasons / religious services and are infrequent or of short duration when for other reasons). o If current dressing causes regression in wound condition, may D/C ordered dressing product/s and apply Normal Saline Moist Dressing daily until next Wound Healing Center / Other MD appointment. Notify Wound Healing Center of regression in wound condition at [phone]. o Please direct any NON-WOUND related issues/requests for orders to patient's Primary Care Physician Laboratory o Bacteria identified in Wound by Culture (MICRO) - sacrum oooo LOINC Code: 6462-6 oooo Convenience Name: Wound culture routine [name], [name] ([id number]) Patient Medications Allergies: Sulfa (Sulfonamide Antibiotics) Notifications Medication Indication Start End Cipro wound infection 05/10/2017 DOSE oral 250 mg/5 mL suspension,microcapsule recon - suspension,microcapsule recon oral 500 mg=55ml bid for 7 days Electronic Signature(s) Signed: 05/10/2017 10:46:46 AM By: Baltazar Najjar MD Previous Signature: 05/05/2017 5:51:56 PM Version By: Baltazar Najjar MD Previous Signature:  05/06/2017 4:19:36 PM Version By: [name] Entered By: Baltazar Najjar on 05/10/2017 10:46:42 White, [name] ([id number]) -------------------------------------------------------------------------------- Problem List Details Patient Name: [name], [name] Date of Service: 05/05/2017 1:15 PM Medical Record Number: [id number] Patient Account Number: [id number] Date of Birth/Sex: Aug 15, 1920 (83 y.o. 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 Date of Service: 05/05/2017 1:15 PM Medical Record Number: [id number] Patient Account Number: [id number] Date of Birth/Sex: Jan 04, 1921 (83 y.o. Female) Treating RN: Ashok Cordia, Debi Primary Care Provider: [name] Other Clinician: Referring Provider: [name] Treating Provider/Extender: Altamese [place] in Treatment: 21 Subjective History of Present Illness (HPI) 03/24/16; this is an elderly 83 year old woman with  advanced dementia who was hospitalized from 5/8 through 5/13. At that point she had Proteus sepsis felt to be secondary to a UTI the. Acute kidney injury and delirium. Her son who is her primary caregiver says she came home from the hospital with wounds on her right lower extremity and apparently her right buttock as well. There is no mention on the hospital discharge summary of problems. She has been having [facility] and have been using silver alginate based dressings. Apparently the area on the right buttock is healing and the son stated we did not need to become involved with that. In any case the patient has for wounds to on the right heel and 2 on the posterior lateral right calf, the latter of which is not a usual pressure area however that is the history. She is apparently eating and drinking fairly well. Takes ensure well. She does not have any pressure-relief surface at home. I have reviewed her lab work from the hospital. Her admission albumin was 3.4 on 5/8 04/01/16; patient arrives with wounds looking much the same as last week. She has an extensive pressure area over her right heel and to small but deep wounds on the lateral aspect of her right lower leg. These wounds are not connected. Although there are advertised his pressure ulcers these 2 wounds are not usual for pressure ulcerations. We have not looked at the pressure ulceration on her buttock at the request of the patient's son who is the primary caregiver 04/08/16; wounds are stable to improved this week. Culture I did of the lower wound on her right lateral leg grew methicillin sensitive staph aureus she is on Septra. We are using silver alginate to all the wounds. 04/15/16; the 2 small areas on the lateral aspect of this lady's right leg continued to improve however the heel is once again covered with callus, residual alginate, acrotic material all of which requires a difficult debridement. There continues to  be purulent material under this surface. This is in spite of a week of Septra that I gave him for MSSA 04/22/16 Patient today presents for follow-up evaluation. She is seen with her son in the office at this point in time she does have Alzheimer's. The good news is her wounds appeared to be somewhat smaller with current therapy. At this point in time the infection which was cultured previously appears to have improved in regard to the right heel wound. There is no purulent drainage at this point in time and a good portion of the wound  actually is eschar covered and dry with a medial portion actually open to granulation with very little slough covering. She really has no significant discomfort with palpation manipulation of the wound at this point in time. 04/28/16 patient presents today for follow-up evaluation concerning her right lateral calf wound as well as right heel wound. she has not really been complaining this for as pain is concerned according to her son seen with her in the office at this point in time today. She seems to show no interval signs or symptoms of infection overall has been tolerating the dressing changes well. She does have Alzheimer's therefore is not able to rate her pain although she does respond with an affirmative that she hurts when I press over the heel region. 05/05/16; the area on the lateral aspect of her right calf is healed. The right heel as 2 small spots that are still open and very close to resolving as well using Aquacel Ag under border foam, 05/20/16; the area on the lateral aspect of her right calf remains closed. The 2 small areas on her right heel are also closed. READMISSION 12/09/16; this is a 83 year old woman with advanced dementia that we cared for in the clinic from September to the beginning of November 2017. At that point she had a right heel, right lateral calf and right buttock wound all felt to be secondary to pressure. These eventually closed  over. The patient's son who is the primary caregiver states that over the last 2 months she is developed bilateral lower extremity pressure ulcers. [facility] and they have been applying Medihoney and an alginate. These were apparently all pressure ulcers today including area on the left posterior calf. Apparently they were keeping her leg elevated on the pillow which contributed to this. [name], [name] ([id number]) The patient has advanced dementia, is nonambulatory and not able to move herself in bed. There is apparently not a nutritional issue. [facility] has [facility]. 12/16/16; patient was readmitted to the clinic last week with multiple wounds including the medial and lateral aspects of the left calcaneus left posterior calf right calcaneus. We've been using Iodoflex, dressing change by home health. Her son also reports that she has had trouble breathing which she attributes to the low air loss mattress we have for pressure relief. He states that when he took her out of bed and put her in her chair breathing improved. As of Monday he took the low air loss mattress off the bed and states that she is able to go to bed without shortness of breath. His description sounded a lot like orthopnea. He is asking [place] to order a gel mattress overlay. According to her son she does not aspirate at least not frequently 12/23/16; the patient is reviewed today for multiple wounds on her lower extremities. We have been using Iodoflex dressing change by home health. Last week the patient was Cheyne-Stokes in with an x-ray that look like heart failure with small bilateral pleural effusions. I gave her Lasix and some potassium. She saw her primary physician earlier this week who did not add anything to this. Her son states she is doing better she has not had any of the orthopneic episodes since Monday 01/06/17; 3 week followup. using iodoflex. She has a large wound on the left  lateral calf with exposed tendon, and necrotic wound over the left medial heel. Wound over the left lateral malleolus appears to be improved. On the right  side necrotic wound on the right medial heel, wound on the right lateral heel looks somewhat better and the area on the left anterior lateral calf looks as though it's progressing towards closure. The patient may have arterial issues however she is too far along in terms of her dementia to undergo testing. Her son claims she is eating well and there offloading this. 01/27/17 on evaluation today patient appears to be doing somewhat worse with new wounds over the right fifth metatarsal region laterally as well as the dorsal right foot. Both of these appear to be deep tissue injuries which have now opened over the two weeks since we last saw her. She also has a mildly tissue injury in the fifth metatarsal region on the left although this does not appear to be worsening and in fact I think wound up being okay. Nonetheless she does have skin in which is loose of the right heel as well as the left posterior calf region. She wears the [employer] when she is in bed but has not been wearing them when she is in her wheelchair. 02/03/17; culture that was done last week of the right dorsal foot grew both methicillin sensitive staph aureus and Pseudomonas. She was initially thought to have Cipro however that up apparently increases QT along with Aricept was not aware that Aricept did this. This was changed to cefdinir and Septra however the patient is apparently allergic to Septra even though it is not listed in our records. The patient started the cefdinir yesterday. In spite of the delay of antibiotics the area on the right dorsal foot actually looks a lot better than last week with a thick covering eschar and necrotic tissue but no surrounding erythema or drainage. I also took the opportunity today to review her vascular status. When she was in our clinic  the first time her ABI on the left was 0.9 and on the right noncompressible. It is still noncompressible today. She has not had imaging studies she has the following wounds including the right dorsal ankle/foot, right heel and right fifth metatarsal head. On the left side she has the medial left heel the left lateral malleolus and the left posterior lateral calf. We have been using Iodoflex on the right and Prisma on the left 02/10/17; in addition to the bilateral foot and calf wounds. It is apparently become clear that the patient actually has a small stage II wound on her coccyx and the right. ishium. They have been apparently therefore many months perhaps total of 8 or 9. Home health is been taking care of these and recently decided that he needed orders. With regards to the lower extremity wounds the really worrisome areas here are the heel on the right. X-rays I ordered last week showed a possible area of cortical erosion involving the distal lateral fifth metatarsal head question early osteomyelitis. There was no common and on the right heel which was the worrisome area. The left tib-fib showed no osseous abnormality. Left foot showed no acute osseous abnormality she continues to have wounds on the right heel, right anterior foot, right lateral fifth metatarsal head, left medial heel, left lateral heel, left lateral calf as well as the new wounds at least as noted above 03/10/17: I see this patient with advanced dementia monthly on a palliative basis. Presenting with 11 identifiable wounds today. Per her son who is the primary caregiver she eats and drinks well. He still brings her to doctor's appointments and brings her out  for hair appointments. I previously suggested hospice care to this patient's son however he is not willing to listen to this at [name], [name] ([id number]) this point 04/07/17; this is a patient with end-stage dementia that I see for wound care on a palliative basis  monthly. Her major continued problem is a right heel wound that is draining necrotic and has exposed bone. With considerable difficulty today I managed to obtain a piece of bone for bone culture. Although the patient's son is never really followed me on the idea of palliative wound care he is certainly not of totally hospice mindset. She also has wounds on the Right anterior foot with exposed tendon right fifth metatarsal head, left lateral leg which is mostly healed, left heel, roughly T12 on her back and a small probing hole on her lower sacrum 05/05/17; this is a patient I see monthly. She is advanced dementia multiple pressure ulcers. From our clinic's point of view this is a palliative situation and I told this to her son brings her although I'm not sure how well he sees it. He does heroic care for his mother however The major issue here had been her right heel. Deep tissue culture that I did last time showed methicillin sensitive staph aureus and Enterococcus faecalis. She received a course of Augmentin and he'll actually still looks somewhat better. Objective Constitutional Sitting or standing Blood Pressure is within target range for patient.. Pulse regular and within target range for patient.Marland Kitchen Respirations regular, non-labored and within target range.. Temperature is normal and within the target range for the patient.Marland Kitchen appears in no distress. Vitals Time Taken: 1:38 PM, Height: 69 in, Weight: 105 lbs, BMI: 15.5, Temperature: 97.7 [name], Pulse: 81 bpm, Respiratory Rate: 16 breaths/min, Blood Pressure: 140/71 mmHg. Respiratory Respiratory effort is easy and symmetric bilaterally. Rate is normal at rest and on room air.. Cardiovascular does not appear dehydrated. Gastrointestinal (GI) Abdomen is soft and non-distended without masses or tenderness. Bowel sounds active in all quadrants.. No liver or spleen enlargement or tenderness.. Lymphatic none palpable in the popliteal or inguinal  area. Psychiatric advanced dementia. General Notes: Exam; large necrotic wound on the right heel looks better than last time. Noted bright and it was necessary. Right anterior foot wound looks as though it's contracted Left heel wound appears stable Left anterior leg wound also appears stable 2 areas one on the mid back and one on the lower sacrum. There was purulent drainage in the area of lower sacrum I cultured this. Superior area also has some depth. We have been using iodoform packing both of these areas Integumentary (Hair, Skin) Wound #10 status is Open. Original cause of wound was Pressure Injury. The wound is located on the Right,Dorsal Foot. The wound measures 0.9cm length x 0.5cm width x 0.2cm depth; 0.353cm^2 area and 0.071cm^3 volume. [name], FRETWELL ([id number]) Wound #11 status is Open. Original cause of wound was Pressure Injury. The wound is located on the Right,Plantar Metatarsal head fifth. The wound measures 0.2cm length x 0.2cm width x 0.2cm depth; 0.031cm^2 area and 0.006cm^3 volume. Wound #13 status is Open. Original cause of wound was Pressure Injury. The wound is located on the Sacrum. The wound measures 0.4cm length x 0.5cm width x 0.8cm depth; 0.157cm^2 area and 0.126cm^3 volume. Wound #14 status is Healed - Epithelialized. Original cause of wound was Pressure Injury. The wound is located on the Left Gluteus. The wound measures 0cm length x 0cm width x 0cm depth; 0cm^2 area and 0cm^3 volume.  There is no tunneling or undermining noted. There is a none present amount of drainage noted. The wound margin is flat and intact. There is no granulation within the wound bed. There is no necrotic tissue within the wound bed. Periwound temperature was noted as No Abnormality. Wound #15 status is Open. Original cause of wound was Pressure Injury. The wound is located on the Midline Back. The wound measures 0.8cm length x 0.3cm width x 0.4cm depth; 0.188cm^2 area and 0.075cm^3  volume. Wound #5 status is Open. Original cause of wound was Pressure Injury. The wound is located on the Right Calcaneus. The wound measures 5.5cm length x 4.4cm width x 0.4cm depth; 19.007cm^2 area and 7.603cm^3 volume. Wound #6 status is Open. Original cause of wound was Pressure Injury. The wound is located on the Left Calcaneus. The wound measures 1.5cm length x 1.2cm width x 0.3cm depth; 1.414cm^2 area and 0.424cm^3 volume. Wound #8 status is Open. Original cause of wound was Pressure Injury. The wound is located on the Left,Posterior Lower Leg. The wound measures 1.3cm length x 0.5cm width x 0.2cm depth; 0.511cm^2 area and 0.102cm^3 volume. 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 Cleanse wound with mild soap and water Wound #11 Right,Plantar Metatarsal head fifth: Clean wound with Normal Saline. Cleanse wound with mild soap and water Wound #13 Sacrum: Clean wound with Normal Saline. Cleanse wound with mild soap and water Villela, Loeta L. ([id number]) Wound #15 Midline Back: Clean wound with Normal Saline. Cleanse wound with mild soap and water Wound #5 Right Calcaneus: Clean wound with Normal Saline. Cleanse wound with mild soap and water Wound #6 Left Calcaneus: Clean wound with Normal Saline. Cleanse wound with mild soap and water Wound #8 Left,Posterior Lower Leg: Clean wound with Normal Saline. Cleanse wound with mild soap and water Anesthetic: Wound #10 Right,Dorsal Foot: Topical Lidocaine 4% cream applied to wound bed prior  to debridement - for clinic use Wound #11 Right,Plantar Metatarsal head fifth: Topical Lidocaine 4% cream applied to wound bed prior to debridement - for clinic use Wound #13 Sacrum: Topical Lidocaine 4% cream applied to wound bed prior to debridement - for clinic use Wound #15 Midline Back: Topical Lidocaine 4% cream applied to wound bed prior to debridement - for clinic use Wound #5 Right Calcaneus: Topical Lidocaine 4% cream applied to wound bed prior to debridement - for clinic use Wound #6 Left Calcaneus: Topical Lidocaine 4% cream applied to wound bed prior to debridement - for clinic use Wound #8 Left,Posterior Lower Leg: Topical Lidocaine 4% cream applied to wound bed prior to debridement - for clinic use Skin Barriers/Peri-Wound Care: Wound #10 Right,Dorsal Foot: Skin Prep Wound #11 Right,Plantar Metatarsal head fifth: Skin Prep Wound #13 Sacrum: Skin Prep Wound #15 Midline Back: Skin Prep Wound #8 Left,Posterior Lower Leg: Skin Prep Primary Wound Dressing: Wound #10 Right,Dorsal Foot: Aquacel Ag Wound #11 Right,Plantar Metatarsal head fifth: Aquacel Ag Wound #13 Sacrum: Iodoform packing Gauze - 1'4 inch Wound #15 Midline Back: Iodoform packing Gauze - 1'4 inch Wound #5 Right Calcaneus: Aquacel Ag Wound #6 Left Calcaneus:  Aquacel Ag Wound #8 Left,Posterior Lower Leg: Aquacel Ag Secondary Dressing: Wound #10 Right,Dorsal Foot: Dry Gauze Boardered Foam Dressing Wound #11 Right,Plantar Metatarsal head fifth: Dry Gauze Boardered Foam Dressing ALMETTA, LIDDICOAT. ([id number]) Wound #13 Sacrum: Dry Gauze Boardered Foam Dressing Wound #15 Midline Back: Dry Gauze Boardered Foam Dressing Wound #5 Right Calcaneus: Dry Gauze Conform/Kerlix Other - allevyn heel cup, stretch netting #4 Wound #6 Left Calcaneus: Dry Gauze Conform/Kerlix Other - allevyn heel cup, stretch netting #4 Wound #8 Left,Posterior Lower Leg: Dry Gauze Boardered Foam Dressing Dressing  Change Frequency: Wound #10 Right,Dorsal Foot: Change dressing every other day. Wound #11 Right,Plantar Metatarsal head fifth: Change dressing every other day. Wound #13 Sacrum: Change dressing every other day. Wound #15 Midline Back: Change dressing every day. Wound #5 Right Calcaneus: Change dressing every other day. Wound #6 Left Calcaneus: Change dressing every other day. Wound #8 Left,Posterior Lower Leg: Change dressing every other day. Follow-up Appointments: Wound #10 Right,Dorsal Foot: Other: - 30 days Wound #11 Right,Plantar Metatarsal head fifth: Other: - 30 days Wound #13 Sacrum: Other: - 30 days Wound #15 Midline Back: Other: - 30 days Wound #5 Right Calcaneus: Other: - 30 days Wound #6 Left Calcaneus: Other: - 30 days Wound #8 Left,Posterior Lower Leg: Other: - 30 days Edema Control: Wound #10 Right,Dorsal Foot: Elevate legs to the level of the heart and pump ankles as often as possible Wound #11 Right,Plantar Metatarsal head fifth: Elevate legs to the level of the heart and pump ankles as often as possible Wound #5 Right Calcaneus: Elevate legs to the level of the heart and pump ankles as often as possible Wound #6 Left Calcaneus: Elevate legs to the level of the heart and pump ankles as often as possible Wound #8 Left,Posterior Lower Leg: Elevate legs to the level of the heart and pump ankles as often as possible Off-Loading: Wound #10 Right,Dorsal Foot: Womac, Jaidin L. ([id number]) Turn and reposition every 2 hours Other: - wear sage boots Wound #11 Right,Plantar Metatarsal head fifth: Turn and reposition every 2 hours Other: - wear sage boots Wound #13 Sacrum: Turn and reposition every 2 hours Other: - wear sage boots Wound #15 Midline Back: Turn and reposition every 2 hours Turn and reposition every 2 hours Other: - wear sage boots Other: - wear sage boots Wound #5 Right Calcaneus: Turn and reposition every 2 hours Other: - wear sage  boots Wound #6 Left Calcaneus: Turn and reposition every 2 hours Other: - wear sage boots Wound #8 Left,Posterior Lower Leg: Turn and reposition every 2 hours Other: - wear sage boots Additional Orders / Instructions: Wound #10 Right,Dorsal Foot: Increase protein intake. Other: - left 5th plantar metatarsal head paint with betadine every day (deep tissue injury) place foam to the top of the left foot for padded protection, add bordered foam dressing to midline back to help with pressure relief Wound #11 Right,Plantar Metatarsal head fifth: Increase protein intake. Other: - left 5th plantar metatarsal head paint with betadine every day (deep tissue injury) place foam to the top of the left foot for padded protection, add bordered foam dressing to midline back to help with pressure relief Wound #13 Sacrum: Increase protein intake. Other: - left 5th plantar metatarsal head paint with betadine every day (deep tissue injury) place foam to the top of the left foot for padded protection, add bordered foam dressing to midline back to help with pressure relief Wound #15 Midline Back: Increase protein intake. Other: - left 5th plantar metatarsal  head paint with betadine every day (deep tissue injury) place foam to the top of the left foot for padded protection, add bordered foam dressing to midline back to help with pressure relief Wound #5 Right Calcaneus: Increase protein intake. Other: - left 5th plantar metatarsal head paint with betadine every day (deep tissue injury) place foam to the top of the left foot for padded protection, add bordered foam dressing to midline back to help with pressure relief Wound #6 Left Calcaneus: Increase protein intake. Other: - left 5th plantar metatarsal head paint with betadine every day (deep tissue injury) place foam to the top of the left foot for padded protection, add bordered foam dressing to midline back to help with pressure relief Wound #8  Left,Posterior Lower Leg: Increase protein intake. Other: - left 5th plantar metatarsal head paint with betadine every day (deep tissue injury) place foam to the top of the left foot for padded protection, add bordered foam dressing to midline back to help with pressure relief Home Health: Wound #10 Right,Dorsal Foot: Continue [facility] Visits - [facility] Nurse may visit PRN to address patient s wound care needs. FACE TO FACE ENCOUNTER: MEDICARE and MEDICAID PATIENTS: I certify that this patient is under my care and that I had a face-to-face encounter that meets the physician face-to-face encounter requirements with this patient on this date. The encounter with the patient was in whole or in part for the following MEDICAL CONDITION: (primary reason for Home Healthcare) MEDICAL NECESSITY: I certify, that based on my findings, NURSING services are a medically necessary home health service. HOME BOUND STATUS: I certify that my clinical findings support that this patient is homebound (i.e., Due to Coralee RudSUMMERS, Ethylene L. ([id number]) illness or injury, pt requires aid of supportive devices such as crutches, cane, wheelchairs, walkers, the use of special transportation or the assistance of another person to leave their place of residence. There is a normal inability to leave the home and doing so requires considerable and taxing effort. Other absences are for medical reasons / religious services and are infrequent or of short duration when for other reasons). If current dressing causes regression in wound condition, may D/C ordered dressing product/s and apply Normal Saline Moist Dressing daily until next Wound Healing Center / Other MD appointment. Notify Wound Healing Center of regression in wound condition at [phone]. Please direct any NON-WOUND related issues/requests for orders to patient's Primary Care Physician Wound #11 Right,Plantar Metatarsal head fifth: Continue  Home Health Visits - [facility] Home [facility] Nurse may visit PRN to address patient s wound care needs. FACE TO FACE ENCOUNTER: MEDICARE and MEDICAID PATIENTS: I certify that this patient is under my care and that I had a face-to-face encounter that meets the physician face-to-face encounter requirements with this patient on this date. The encounter with the patient was in whole or in part for the following MEDICAL CONDITION: (primary reason for Home Healthcare) MEDICAL NECESSITY: I certify, that based on my findings, NURSING services are a medically necessary home health service. HOME BOUND STATUS: I certify that my clinical findings support that this patient is homebound (i.e., Due to illness or injury, pt requires aid of supportive devices such as crutches, cane, wheelchairs, walkers, the use of special transportation or the assistance of another person to leave their place of residence. There is a normal inability to leave the home and doing so requires considerable and taxing effort. Other absences are for medical reasons / religious services  and are infrequent or of short duration when for other reasons). If current dressing causes regression in wound condition, may D/C ordered dressing product/s and apply Normal Saline Moist Dressing daily until next Wound Healing Center / Other MD appointment. Notify Wound Healing Center of regression in wound condition at [phone]. Please direct any NON-WOUND related issues/requests for orders to patient's Primary Care Physician Wound #13 Sacrum: Continue [facility] Vis[facility] - [facility] Nurse may visit PRN to address patient s wound care needs. FACE TO FACE ENCOUNTER: MEDICARE and MEDICAID PATIENTS: I certify that this patient is under my care and that I had a face-to-face encounter that meets the physician face-to-face encounter requirements with this patient on this date. The encounter with the patient was in whole or in part  for the following MEDICAL CONDITION: (primary reason for Home Healthcare) MEDICAL NECESSITY: I certify, that based on my findings, NURSING services are a medically necessary home health service. HOME BOUND STATUS: I certify that my clinical findings support that this patient is homebound (i.e., Due to illness or injury, pt requires aid of supportive devices such as crutches, cane, wheelchairs, walkers, the use of special transportation or the assistance of another person to leave their place of residence. There is a normal inability to leave the home and doing so requires considerable and taxing effort. Other absences are for medical reasons / religious services and are infrequent or of short duration when for other reasons). If current dressing causes regression in wound condition, may D/C ordered dressing product/s and apply Normal Saline Moist Dressing daily until next Wound Healing Center / Other MD appointment. Notify Wound Healing Center of regression in wound condition at [phone]. Please direct any NON-WOUND related issues/requests for orders to patient's Primary Care Physician Wound #15 Midline Back: Continue [facility] Vis[facility] - [facility] Nurse may visit PRN to address patient s wound care needs. FACE TO FACE ENCOUNTER: MEDICARE and MEDICAID PATIENTS: I certify that this patient is under my care and that I had a face-to-face encounter that meets the physician face-to-face encounter requirements with this patient on this date. The encounter with the patient was in whole or in part for the following MEDICAL CONDITION: (primary reason for Home Healthcare) MEDICAL NECESSITY: I certify, that based on my findings, NURSING services are a medically necessary home health service. HOME BOUND STATUS: I certify that my clinical findings support that this patient is homebound (i.e., Due to illness or injury, pt requires aid of supportive devices such as crutches, cane,  wheelchairs, walkers, the use of special transportation or the assistance of another person to leave their place of residence. There is a normal inability to leave the home and doing so requires considerable and taxing effort. Other absences are for medical reasons / religious services and are infrequent or of short duration when for other reasons). If current dressing causes regression in wound condition, may D/C ordered dressing product/s and apply Normal Saline Moist Dressing daily until next Wound Healing Center / Other MD appointment. Notify Wound Healing Center of regression in wound condition at [phone]. Please direct any NON-WOUND related issues/requests for orders to patient's Primary Care Physician Wound #5 Right Calcaneus: Continue Home [facility] Visits - [facility] Nurse may visit PRN to address patient s wound care needs. FACE TO FACE ENCOUNTER: MEDICARE and MEDICAID PATIENTS: I certify that this patient is under my care and that I Mesquita, Melitza L. ([id number]) had a face-to-face encounter  that meets the physician face-to-face encounter requirements with this patient on this date. The encounter with the patient was in whole or in part for the following MEDICAL CONDITION: (primary reason for Home Healthcare) MEDICAL NECESSITY: I certify, that based on my findings, NURSING services are a medically necessary home health service. HOME BOUND STATUS: I certify that my clinical findings support that this patient is homebound (i.e., Due to illness or injury, pt requires aid of supportive devices such as crutches, cane, wheelchairs, walkers, the use of special transportation or the assistance of another person to leave their place of residence. There is a normal inability to leave the home and doing so requires considerable and taxing effort. Other absences are for medical reasons / religious services and are infrequent or of short duration when for other reasons). If  current dressing causes regression in wound condition, may D/C ordered dressing product/s and apply Normal Saline Moist Dressing daily until next Wound Healing Center / Other MD appointment. Notify Wound Healing Center of regression in wound condition at [phone]. Please direct any NON-WOUND related issues/requests for orders to patient's Primary Care Physician Wound #6 Left Calcaneus: Continue [facility] Vis[facility] - [facility] Nurse may visit PRN to address patient s wound care needs. FACE TO FACE ENCOUNTER: MEDICARE and MEDICAID PATIENTS: I certify that this patient is under my care and that I had a face-to-face encounter that meets the physician face-to-face encounter requirements with this patient on this date. The encounter with the patient was in whole or in part for the following MEDICAL CONDITION: (primary reason for Home Healthcare) MEDICAL NECESSITY: I certify, that based on my findings, NURSING services are a medically necessary home health service. HOME BOUND STATUS: I certify that my clinical findings support that this patient is homebound (i.e., Due to illness or injury, pt requires aid of supportive devices such as crutches, cane, wheelchairs, walkers, the use of special transportation or the assistance of another person to leave their place of residence. There is a normal inability to leave the home and doing so requires considerable and taxing effort. Other absences are for medical reasons / religious services and are infrequent or of short duration when for other reasons). If current dressing causes regression in wound condition, may D/C ordered dressing product/s and apply Normal Saline Moist Dressing daily until next Wound Healing Center / Other MD appointment. Notify Wound Healing Center of regression in wound condition at [phone]. Please direct any NON-WOUND related issues/requests for orders to patient's Primary Care Physician Wound #8 Left,Posterior  Lower Leg: Continue [facility] Vis[facility] - [facility] Nurse may visit PRN to address patient s wound care needs. FACE TO FACE ENCOUNTER: MEDICARE and MEDICAID PATIENTS: I certify that this patient is under my care and that I had a face-to-face encounter that meets the physician face-to-face encounter requirements with this patient on this date. The encounter with the patient was in whole or in part for the following MEDICAL CONDITION: (primary reason for Home Healthcare) MEDICAL NECESSITY: I certify, that based on my findings, NURSING services are a medically necessary home health service. HOME BOUND STATUS: I certify that my clinical findings support that this patient is homebound (i.e., Due to illness or injury, pt requires aid of supportive devices such as crutches, cane, wheelchairs, walkers, the use of special transportation or the assistance of another person to leave their place of residence. There is a normal inability to leave the home and doing so requires  considerable and taxing effort. Other absences are for medical reasons / religious services and are infrequent or of short duration when for other reasons). If current dressing causes regression in wound condition, may D/C ordered dressing product/s and apply Normal Saline Moist Dressing daily until next Wound Healing Center / Other MD appointment. Notify Wound Healing Center of regression in wound condition at [phone]. Please direct any NON-WOUND related issues/requests for orders to patient's Primary Care Physician silver alginate to all wound areas in the feet and legs iodoform apcking to the back and lower sacral area drainage fromlower sacral wound for C+s. No emperic antibiotics Electronic Signature(s) MEEYAH, OVITT ([id number]) Signed: 05/05/2017 5:51:56 PM By: Baltazar Najjar MD Entered By: Baltazar Najjar on 05/05/2017 16:19:03 Fiumara, [name]  ([id number]) -------------------------------------------------------------------------------- SuperBill Details Patient Name: [name] L. Date of Service: 05/05/2017 Medical Record Number: [id number] Patient Account Number: [id number] Date of Birth/Sex: 1920-12-10 (83 y.o. 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 G30.1 Alzheimer's disease with late onset L89.152 Pressure ulcer of sacral region, stage 2 L89.312 Pressure ulcer of right buttock, stage 2 Facility Procedures CPT4 Code: 13244010 Description: 27253 - WOUND CARE VISIT-LEV 5 EST PT Modifier: Quantity: 1 Physician Procedures CPT4 Code: [id number] Description: 99214 - WC PHYS LEVEL 4 - EST PT ICD-10 Diagnosis Description L89.623 Pressure ulcer of left heel, stage 3 Modifier: Quantity: 1 Electronic Signature(s) Unsigned Previous Signature: 05/05/2017 4:56:15 PM Version By: [name] Previous Signature: 05/05/2017 5:51:56 PM Version By: Baltazar Najjar MD Entered By: Francie Massing on 05/10/2017 10:13:56 Signature(s): Date(s):

## 2017-05-20 DEATH — deceased

## 2017-05-21 LAB — SUSCEPTIBILITY, AER + ANAEROB

## 2017-06-01 IMAGING — CR DG ABD PORTABLE 1V
1 series · 1 of 1 positions shown · non-contrast
Comparison: Pelvic radiograph dated 12/06/2014

CLINICAL DATA: [AGE] female with pain nausea vomiting.
Patient was admitted for sepsis.

EXAM:
PORTABLE ABDOMEN - 1 VIEW

[AP]
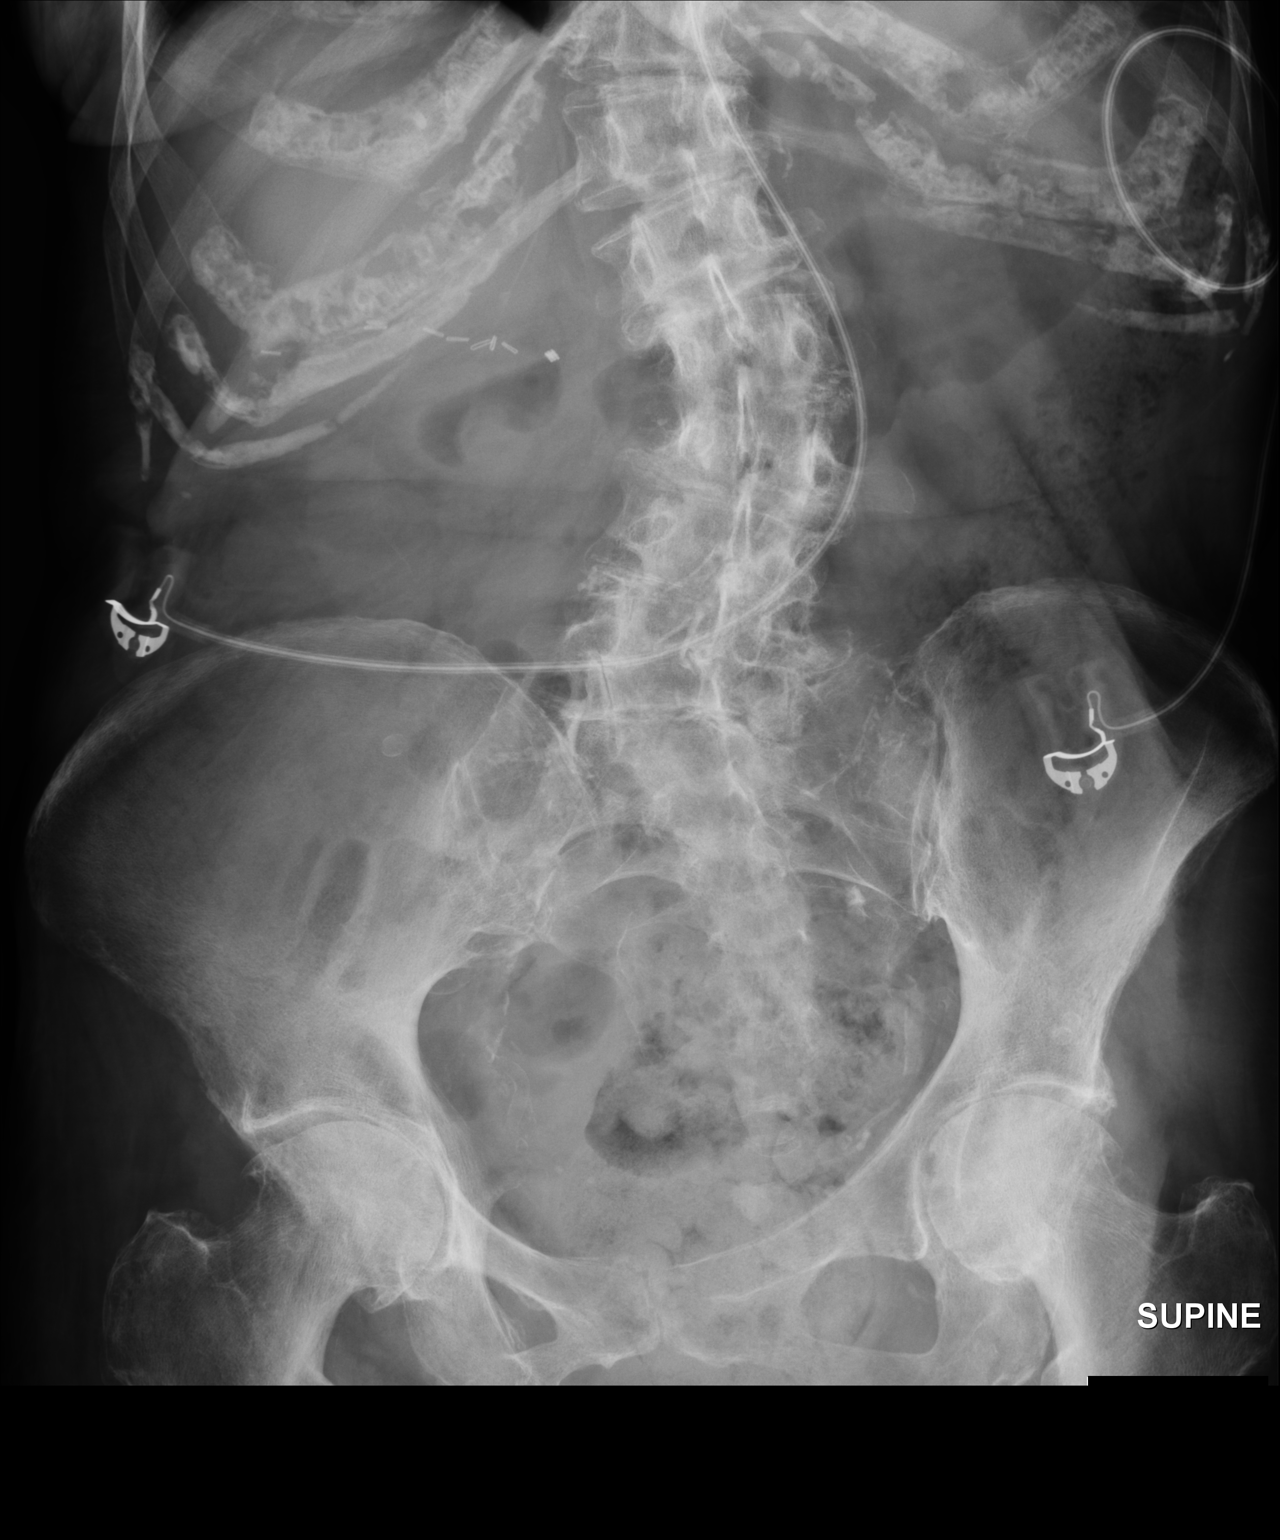

[1 of 1 positions shown; findings below may reference images not displayed]

FINDINGS: Large amount of dense stool noted throughout the distal colon and
rectosigmoid. There is no evidence of bowel obstruction. Right upper
quadrant cholecystectomy clips. A 5 mm rounded density with eggshell
calcification superimposed over the right iliac bone appears similar
to prior study. There is scoliosis with advanced degenerative
changes of the spine. There are osteoarthritic changes of the hip
joints with narrowing of the femoroacetabular joint space. There are
sclerotic changes of the femoral heads. No acute fracture. The soft
tissues are grossly unremarkable.
IMPRESSION: Constipation.  No evidence of bowel obstruction.

Scoliosis, degenerative changes of the spine, and osteoarthritic
changes of the hips. No acute fracture.

## 2017-06-02 ENCOUNTER — Ambulatory Visit: Payer: Medicare Other | Admitting: Internal Medicine

## 2018-03-02 IMAGING — DX DG CHEST 2V
2 series · 2 of 2 positions shown · non-contrast
Comparison: None.

CLINICAL DATA: Nonproductive cough, fever, and tachypneia since
yesterday. Nonsmoker. History of encephalopathy.

EXAM:
CHEST  2 VIEW

[chest lat]
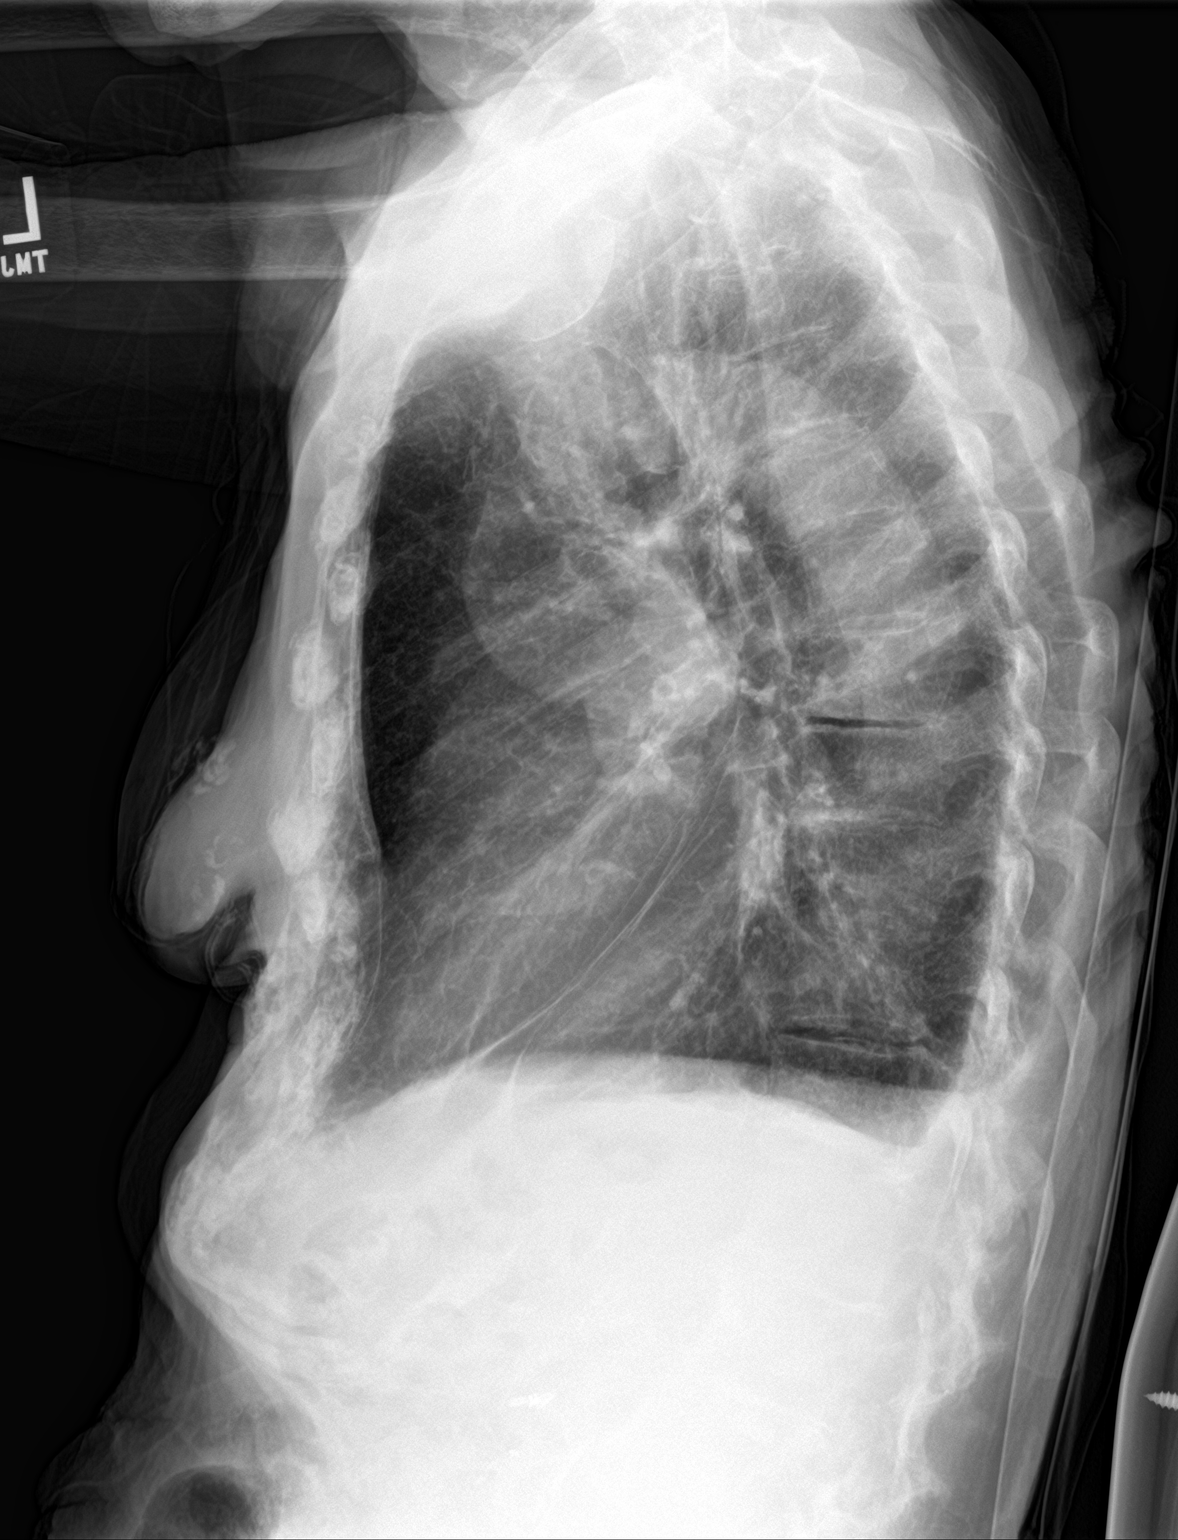

[chest ap]
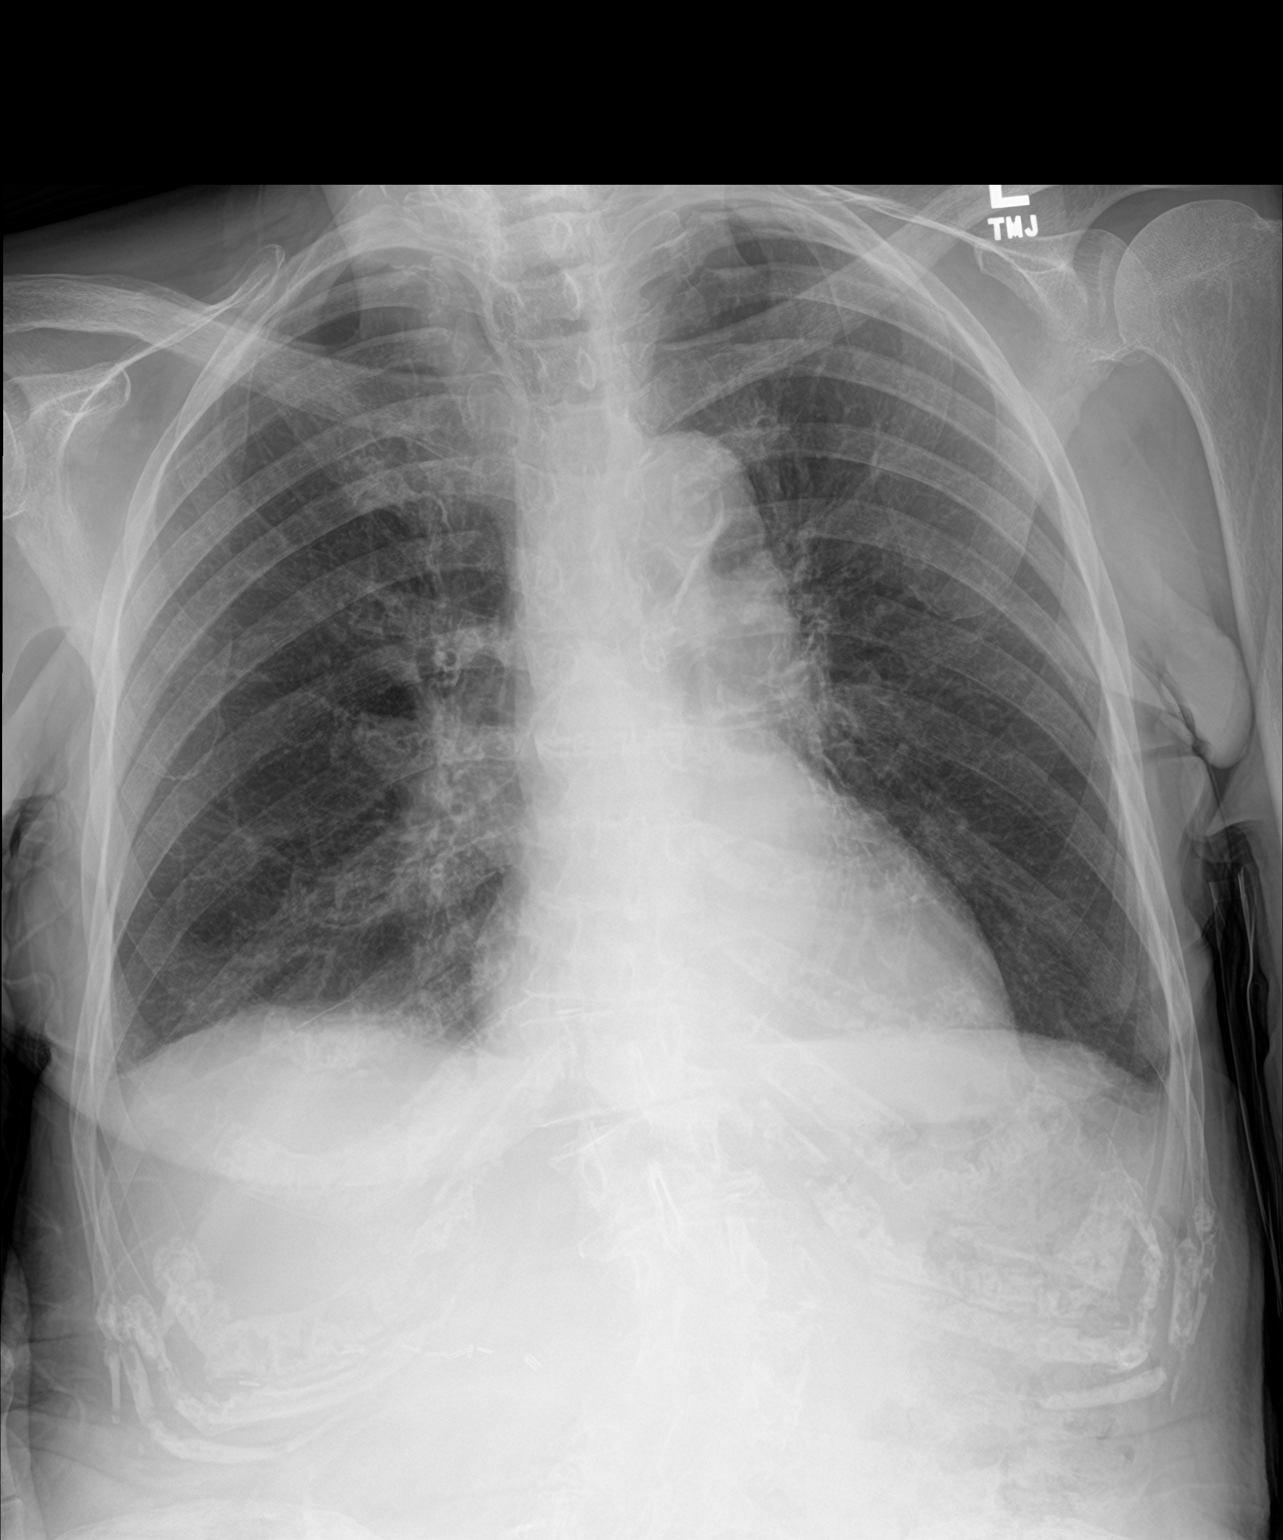

[2 of 2 positions shown; findings below may reference images not displayed]

FINDINGS: The lungs are mildly hyperinflated. There is no focal infiltrate.
There is blunting of the costophrenic angles compatible with tiny
pleural effusions. The heart is normal in size. The pulmonary
vascularity is not engorged. There is the mediastinum is normal in
width. There is calcification in the wall of the thoracic aorta.
There is multilevel degenerative disc disease of the thoracic spine.
IMPRESSION: COPD. Tiny bilateral pleural effusions. No pulmonary edema or
pneumonia.

Thoracic aortic atherosclerosis.

## 2018-03-03 IMAGING — DX DG CHEST 1V PORT
1 series · 1 of 1 positions shown · non-contrast
Comparison: 08/25/2016

CLINICAL DATA: Wheezing, flu

EXAM:
PORTABLE CHEST 1 VIEW

[chest ap]
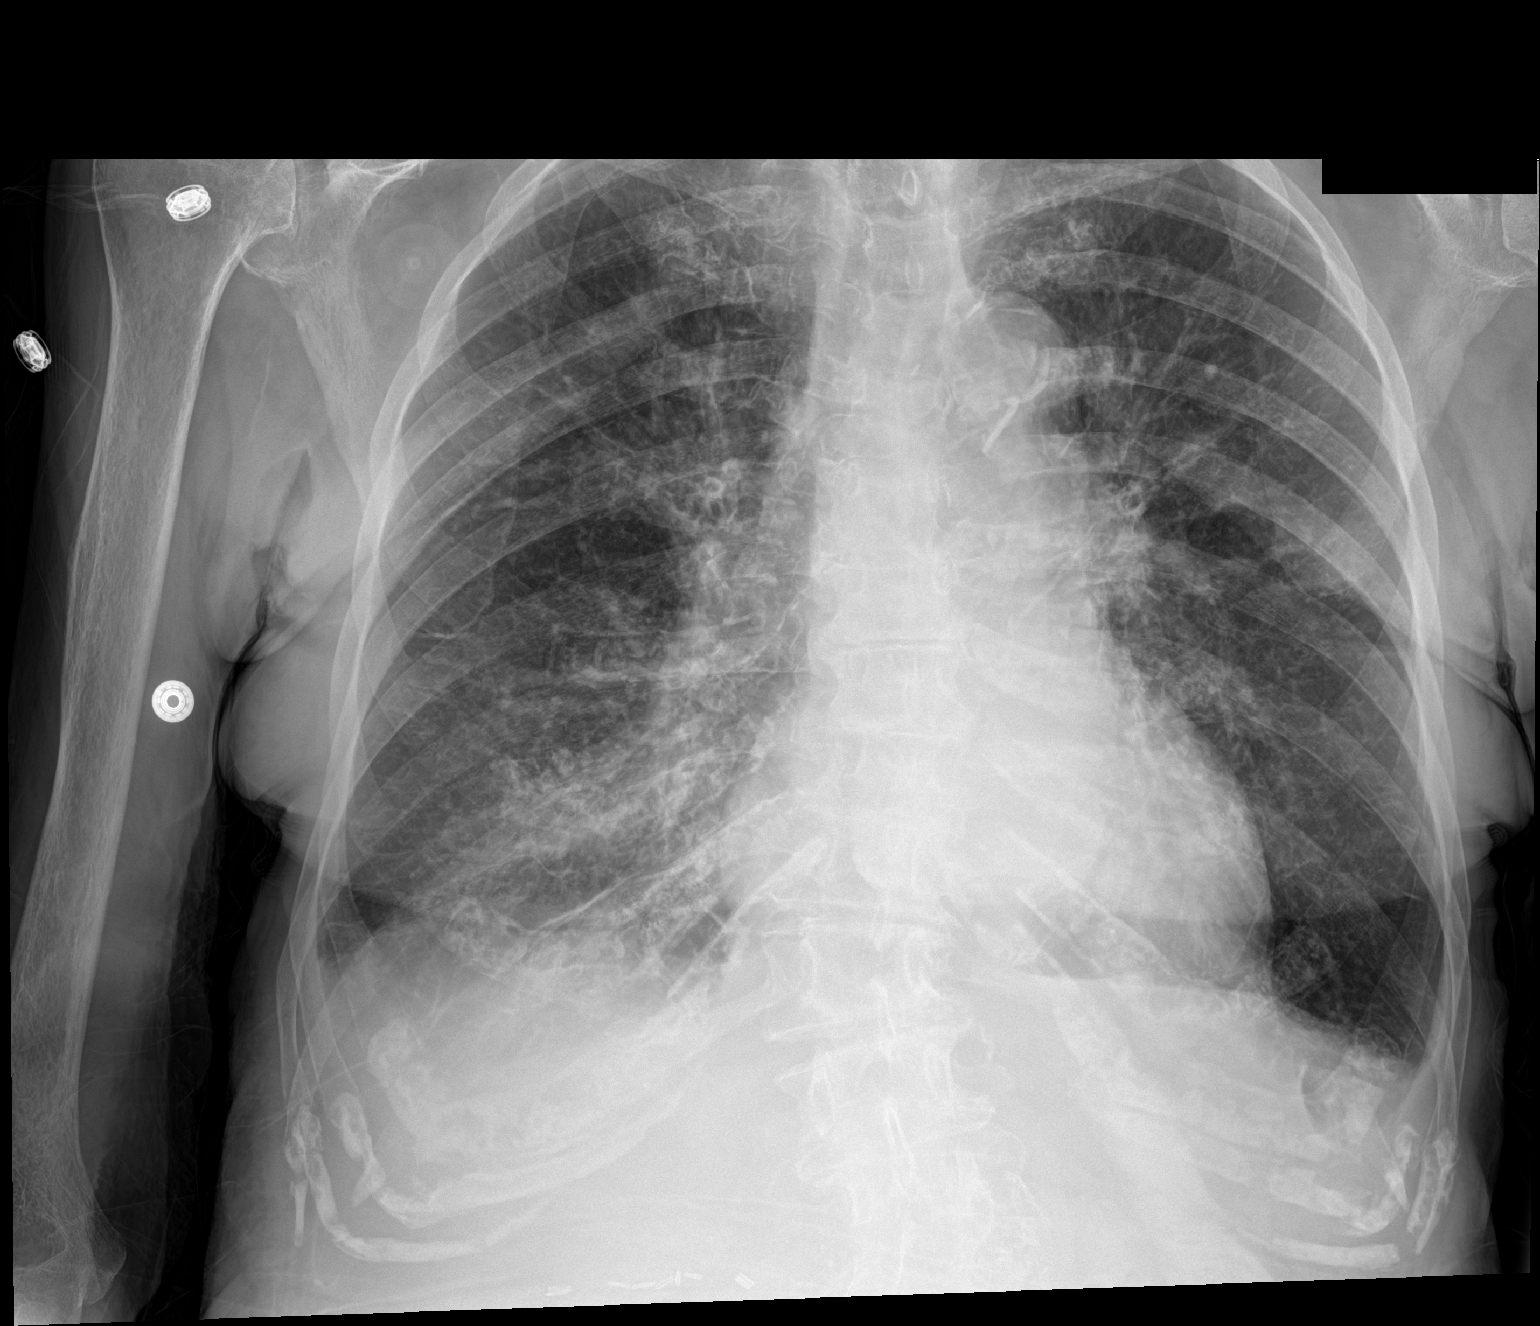

[1 of 1 positions shown; findings below may reference images not displayed]

FINDINGS: There is hyperinflation of the lungs compatible with COPD. Heart is
normal size. Consolidation noted in the right lower lobe compatible
with pneumonia. No visible effusions. Peribronchial thickening. No
acute bony abnormality.
IMPRESSION: COPD, bronchitic changes.

Focal right lower lobe opacity compatible with pneumonia. Followup
PA and lateral chest X-ray is recommended in 3-4 weeks following
trial of antibiotic therapy to ensure resolution and exclude
underlying malignancy.
